# Patient Record
Sex: Female | Born: 1970 | Race: Black or African American | Hispanic: No | Marital: Married | Smoking: Never smoker
Health system: Southern US, Community
[De-identification: ages and names within clinical notes are randomized; demographics above are authoritative.]

## PROBLEM LIST (undated history)

## (undated) DIAGNOSIS — Z923 Personal history of irradiation: Secondary | ICD-10-CM

## (undated) DIAGNOSIS — Z9221 Personal history of antineoplastic chemotherapy: Secondary | ICD-10-CM

## (undated) DIAGNOSIS — I10 Essential (primary) hypertension: Secondary | ICD-10-CM

## (undated) DIAGNOSIS — R519 Headache, unspecified: Secondary | ICD-10-CM

## (undated) DIAGNOSIS — R51 Headache: Secondary | ICD-10-CM

## (undated) DIAGNOSIS — R000 Tachycardia, unspecified: Secondary | ICD-10-CM

## (undated) DIAGNOSIS — E0590 Thyrotoxicosis, unspecified without thyrotoxic crisis or storm: Secondary | ICD-10-CM

## (undated) DIAGNOSIS — E039 Hypothyroidism, unspecified: Secondary | ICD-10-CM

## (undated) DIAGNOSIS — E559 Vitamin D deficiency, unspecified: Secondary | ICD-10-CM

## (undated) DIAGNOSIS — D509 Iron deficiency anemia, unspecified: Secondary | ICD-10-CM

## (undated) DIAGNOSIS — R233 Spontaneous ecchymoses: Secondary | ICD-10-CM

## (undated) DIAGNOSIS — C50919 Malignant neoplasm of unspecified site of unspecified female breast: Secondary | ICD-10-CM

## (undated) DIAGNOSIS — R002 Palpitations: Secondary | ICD-10-CM

## (undated) DIAGNOSIS — R7309 Other abnormal glucose: Secondary | ICD-10-CM

## (undated) HISTORY — PX: KNEE SURGERY: SHX244

## (undated) HISTORY — DX: Spontaneous ecchymoses: R23.3

## (undated) HISTORY — PX: BREAST BIOPSY: SHX20

## (undated) HISTORY — DX: Palpitations: R00.2

## (undated) HISTORY — DX: Headache: R51

## (undated) HISTORY — PX: TONSILLECTOMY: SUR1361

## (undated) HISTORY — DX: Tachycardia, unspecified: R00.0

## (undated) HISTORY — DX: Essential (primary) hypertension: I10

## (undated) HISTORY — DX: Other abnormal glucose: R73.09

## (undated) HISTORY — PX: BREAST EXCISIONAL BIOPSY: SUR124

## (undated) HISTORY — DX: Iron deficiency anemia, unspecified: D50.9

## (undated) HISTORY — DX: Vitamin D deficiency, unspecified: E55.9

## (undated) HISTORY — DX: Hypothyroidism, unspecified: E03.9

## (undated) HISTORY — PX: CHOLECYSTECTOMY: SHX55

---
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
## 1999-09-29 ENCOUNTER — Other Ambulatory Visit: Admission: RE | Admit: 1999-09-29 | Discharge: 1999-09-29 | Payer: Self-pay | Admitting: Obstetrics and Gynecology

## 2000-02-26 ENCOUNTER — Ambulatory Visit (HOSPITAL_BASED_OUTPATIENT_CLINIC_OR_DEPARTMENT_OTHER): Admission: RE | Admit: 2000-02-26 | Discharge: 2000-02-26 | Payer: Self-pay | Admitting: *Deleted

## 2000-11-05 ENCOUNTER — Other Ambulatory Visit: Admission: RE | Admit: 2000-11-05 | Discharge: 2000-11-05 | Payer: Self-pay | Admitting: Obstetrics and Gynecology

## 2002-02-01 ENCOUNTER — Encounter: Admission: RE | Admit: 2002-02-01 | Discharge: 2002-02-01 | Payer: Self-pay | Admitting: Internal Medicine

## 2002-02-01 ENCOUNTER — Encounter: Payer: Self-pay | Admitting: Internal Medicine

## 2002-11-06 ENCOUNTER — Ambulatory Visit (HOSPITAL_COMMUNITY): Admission: RE | Admit: 2002-11-06 | Discharge: 2002-11-06 | Payer: Self-pay | Admitting: Obstetrics & Gynecology

## 2002-11-06 ENCOUNTER — Encounter: Payer: Self-pay | Admitting: Obstetrics & Gynecology

## 2003-01-23 ENCOUNTER — Encounter: Payer: Self-pay | Admitting: Obstetrics & Gynecology

## 2003-01-23 ENCOUNTER — Ambulatory Visit (HOSPITAL_COMMUNITY): Admission: RE | Admit: 2003-01-23 | Discharge: 2003-01-23 | Payer: Self-pay | Admitting: Obstetrics & Gynecology

## 2003-03-17 ENCOUNTER — Inpatient Hospital Stay (HOSPITAL_COMMUNITY): Admission: AD | Admit: 2003-03-17 | Discharge: 2003-03-19 | Payer: Self-pay | Admitting: Obstetrics & Gynecology

## 2003-03-23 ENCOUNTER — Encounter: Admission: RE | Admit: 2003-03-23 | Discharge: 2003-04-22 | Payer: Self-pay | Admitting: Obstetrics & Gynecology

## 2004-08-05 ENCOUNTER — Ambulatory Visit (HOSPITAL_COMMUNITY): Admission: RE | Admit: 2004-08-05 | Discharge: 2004-08-05 | Payer: Self-pay | Admitting: Obstetrics

## 2004-10-27 ENCOUNTER — Emergency Department (HOSPITAL_COMMUNITY): Admission: EM | Admit: 2004-10-27 | Discharge: 2004-10-27 | Payer: Self-pay | Admitting: Emergency Medicine

## 2015-06-26 ENCOUNTER — Ambulatory Visit (HOSPITAL_BASED_OUTPATIENT_CLINIC_OR_DEPARTMENT_OTHER): Admitting: Hematology & Oncology

## 2015-06-26 ENCOUNTER — Ambulatory Visit

## 2015-06-26 ENCOUNTER — Encounter: Payer: Self-pay | Admitting: Hematology & Oncology

## 2015-06-26 ENCOUNTER — Other Ambulatory Visit (HOSPITAL_BASED_OUTPATIENT_CLINIC_OR_DEPARTMENT_OTHER)

## 2015-06-26 VITALS — BP 130/86 | HR 104 | Temp 97.8°F | Resp 18 | Ht 69.0 in | Wt 195.0 lb

## 2015-06-26 DIAGNOSIS — D508 Other iron deficiency anemias: Secondary | ICD-10-CM | POA: Diagnosis not present

## 2015-06-26 DIAGNOSIS — D473 Essential (hemorrhagic) thrombocythemia: Secondary | ICD-10-CM

## 2015-06-26 DIAGNOSIS — N920 Excessive and frequent menstruation with regular cycle: Secondary | ICD-10-CM | POA: Diagnosis not present

## 2015-06-26 DIAGNOSIS — D509 Iron deficiency anemia, unspecified: Secondary | ICD-10-CM | POA: Diagnosis not present

## 2015-06-26 DIAGNOSIS — D75839 Thrombocytosis, unspecified: Secondary | ICD-10-CM

## 2015-06-26 LAB — CBC WITH DIFFERENTIAL (CANCER CENTER ONLY)
BASO#: 0 10*3/uL (ref 0.0–0.2)
BASO%: 0.4 % (ref 0.0–2.0)
EOS ABS: 0.1 10*3/uL (ref 0.0–0.5)
EOS%: 0.7 % (ref 0.0–7.0)
HCT: 36.6 % (ref 34.8–46.6)
HEMOGLOBIN: 12.3 g/dL (ref 11.6–15.9)
LYMPH#: 1.5 10*3/uL (ref 0.9–3.3)
LYMPH%: 17.2 % (ref 14.0–48.0)
MCH: 30.3 pg (ref 26.0–34.0)
MCHC: 33.6 g/dL (ref 32.0–36.0)
MCV: 90 fL (ref 81–101)
MONO#: 0.6 10*3/uL (ref 0.1–0.9)
MONO%: 6.5 % (ref 0.0–13.0)
NEUT#: 6.4 10*3/uL (ref 1.5–6.5)
NEUT%: 75.2 % (ref 39.6–80.0)
PLATELETS: 318 10*3/uL (ref 145–400)
RBC: 4.06 10*6/uL (ref 3.70–5.32)
RDW: 14.9 % (ref 11.1–15.7)
WBC: 8.5 10*3/uL (ref 3.9–10.0)

## 2015-06-26 LAB — CHCC SATELLITE - SMEAR

## 2015-06-26 NOTE — Progress Notes (Signed)
Referral MD  Reason for Referral: Transient thrombocytosis secondary to iron deficiency anemia   Chief Complaint  Patient presents with  . OTHER    New Patient  : My blood was not right.  HPI: Shannon Kane is a very charming 45 year old African-American female. She has a very interesting job. 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  She's been doing quite well. She does not have any sickle cell disease. There is none in the family.  Her gallbladder has been taken out. This was pulled years ago. Per she's had 4 knee surgeries on her right knee.  She was not feeling all that well recently. She saw her family doctor. Labs were done. She is found to have iron deficiency. She is found to have thrombocytosis. Her platelet count was 477,000. Her ferritin was 11 and her iron saturation was 10%.  She was given some iron supplementation. She tolerated this fairly well.  She feels better. She is not as tired.  She's had no problems with weight loss or weight gain. She has gained a Gahm weight because her knees have bothered her and she is having of exercise.  She's had no pain in the hands or feet.  She has menometrorrhagia. She has uterine fibroids. It sounds like she may need to have a hysterectomy. She sees her gynecologist in a week or so.  There is no history of malignancy in the family. She does not smoke. She drinks on occasion. She has 2 kids. She's had no problems with miscarriages.  She's had no rashes. She's had no cough or shortness of breath. She's had no change in bowel or bladder habits.  She does get her mammograms yearly and these have been okay.  Overall, her performance status is ECOG 0.   No past medical history on file.:  No past surgical history on file.:   Current outpatient prescriptions:  .  Cholecalciferol (VITAMIN D-3 PO), Take 4,000 capsules by mouth daily., Disp: , Rfl:  .  hydrochlorothiazide  (MICROZIDE) 12.5 MG capsule, , Disp: , Rfl:  .  Iron-Vit C-FA-B12-Biot-CU-DSS (FERIVAFA) 110-1 MG CAPS, , Disp: , Rfl:  .  lisinopril (PRINIVIL,ZESTRIL) 20 MG tablet, , Disp: , Rfl:  .  SPRINTEC 28 0.25-35 MG-MCG tablet, , Disp: , Rfl:  .  SYNTHROID 75 MCG tablet, , Disp: , Rfl: :  :  Not on File:  No family history on file.:  Social History   Social History  . Marital Status: Married    Spouse Name: N/A  . Number of Children: N/A  . Years of Education: N/A   Occupational History  . Not on file.   Social History Main Topics  . Smoking status: Never Smoker   . Smokeless tobacco: Not on file  . Alcohol Use: Not on file  . Drug Use: Not on file  . Sexual Activity: Not on file   Other Topics Concern  . Not on file   Social History Narrative  . No narrative on file  :  Pertinent items are noted in HPI.  Exam: @IPVITALS @  well-developed and well-nourished African-American female in no obvious distress. Vital signs show temperature of 97.8. Pulse 104. Blood pressure and 30/86. Weight is 195 pounds. Head and neck exam shows no ocular or oral lesions. She has no palpable cervical or supraclavicular lymph nodes. Lungs are clear. Cardiac exam regular rate and rhythm with no murmurs, rubs or bruits. Abdomen is soft. She has  good bowel sounds. There is no fluid wave. There is no palpable liver or spleen tip. Back exam shows no tenderness over the spine, ribs or hips. Extremity shows no clubbing, cyanosis or edema. Neurological exam shows no focal neurological deficits. Skin exam shows no erythema of the hands or feet.    Recent Labs  06/26/15 1332  WBC 8.5  HGB 12.3  HCT 36.6  PLT 318   No results for input(s): NA, K, CL, CO2, GLUCOSE, BUN, CREATININE, CALCIUM in the last 72 hours.  Blood smear review:  Normochromic and OC palpation of red blood cells. She has no target cells. There is no nucleated red blood cells. I see no spherocytes or schistocytes I see no rouleau  formation. White cells are normal in morphology maturation. There is no immature myeloid or lymphoid forms. She has no hypersegmented polys. There is no blasts. Platelets are adequate in number and size. She has well granulated platelets. She has a couple large platelets.  Pathology: None     Assessment and Plan:  [name] is a very charming 45 year old African-American female. She had transient thrombocytosis which clearly was from iron deficiency anemia.  Her platelet count was great today. She's not anemic. Her MCV is okay.  I cannot find anything on her physical exam that would suggest an underlying myeloproliferative process. I'll see anything that looks like inflammatory conditions.  She does not need a bone marrow test. I don't see that we had to send blood off for flow cytometry.  For now, I do still think we have to get her back into the office. We really are not going to be changing her health care.  I think that her menometrorrhagia is her biggest problem. I think if her uterus needs to come out or if she needs to be placed onto someone that will help with the monthly cycles, that probably would help her most.  I spent about 45 minutes with her. She is very charming. He was fun talking to her about her job.  Shannon Kane

## 2015-06-27 LAB — IRON AND TIBC
%SAT: 16 % — ABNORMAL LOW (ref 21–57)
Iron: 70 ug/dL (ref 41–142)
TIBC: 425 ug/dL (ref 236–444)
UIBC: 355 ug/dL (ref 120–384)

## 2015-06-27 LAB — RETICULOCYTES: Reticulocyte Count: 1.5 % (ref 0.6–2.6)

## 2015-06-27 LAB — FERRITIN: FERRITIN: 14 ng/mL (ref 9–269)

## 2015-07-01 ENCOUNTER — Telehealth: Payer: Self-pay | Admitting: *Deleted

## 2015-07-01 NOTE — Telephone Encounter (Addendum)
Patient aware of results. Patient is taking her iron supplement.  ----- Message from Volanda Napoleon, MD sent at 06/27/2015  2:11 PM EST ----- Call - yur iron is borderline. Please continue to take iron pills or MVI with iron!!  Shannon Kane

## 2015-07-01 NOTE — Telephone Encounter (Signed)
duplicate

## 2016-02-16 DIAGNOSIS — N393 Stress incontinence (female) (male): Secondary | ICD-10-CM | POA: Insufficient documentation

## 2018-03-03 ENCOUNTER — Encounter: Payer: Self-pay | Admitting: Internal Medicine

## 2018-03-03 ENCOUNTER — Ambulatory Visit (INDEPENDENT_AMBULATORY_CARE_PROVIDER_SITE_OTHER): Admitting: Internal Medicine

## 2018-03-03 VITALS — BP 126/88 | HR 69 | Temp 98.1°F | Ht 69.0 in | Wt 184.6 lb

## 2018-03-03 DIAGNOSIS — R7309 Other abnormal glucose: Secondary | ICD-10-CM | POA: Diagnosis not present

## 2018-03-03 DIAGNOSIS — I10 Essential (primary) hypertension: Secondary | ICD-10-CM | POA: Diagnosis not present

## 2018-03-03 DIAGNOSIS — Z5181 Encounter for therapeutic drug level monitoring: Secondary | ICD-10-CM

## 2018-03-03 DIAGNOSIS — E039 Hypothyroidism, unspecified: Secondary | ICD-10-CM | POA: Diagnosis not present

## 2018-03-03 DIAGNOSIS — Z79899 Other long term (current) drug therapy: Secondary | ICD-10-CM

## 2018-03-03 NOTE — Progress Notes (Signed)
  Subjective:     Patient ID: Shannon Kane , female    DOB: 1971/05/06 , 47 y.o.   MRN: [id number]   Hypertension  This is a chronic problem. The current episode started more than 1 year ago. The problem is unchanged. The problem is controlled. Pertinent negatives include no blurred vision, chest pain or headaches. Agents associated with hypertension include thyroid hormones. There are no compliance problems.      Past Medical History:  Diagnosis Date  . Abnormal glucose   . Headache   . Hypothyroidism   . Iron deficiency anemia   . Palpitation   . Spontaneous ecchymoses   . Tachycardia   . Vitamin D deficiency       Current Outpatient Medications:  .  Cholecalciferol (VITAMIN D-3 PO), Take 4,000 capsules by mouth daily., Disp: , Rfl:  .  hydrochlorothiazide (MICROZIDE) 12.5 MG capsule, , Disp: , Rfl:  .  Iron-Vit C-FA-B12-Biot-CU-DSS (FERIVAFA) 110-1 MG CAPS, , Disp: , Rfl:  .  lisinopril (PRINIVIL,ZESTRIL) 20 MG tablet, , Disp: , Rfl:  .  Specialty Vitamins Products (MAGNESIUM, AMINO ACID CHELATE,) 133 MG tablet, Take 1 tablet by mouth 2 (two) times daily., Disp: , Rfl:  .  SYNTHROID 75 MCG tablet, , Disp: , Rfl:    Allergies  Allergen Reactions  . Amoxicillin   . Ciprofloxacin   . Penicillins      Review of Systems  Constitutional: Negative.   HENT: Negative.   Eyes: Negative.  Negative for blurred vision.  Respiratory: Negative.   Cardiovascular: Negative.  Negative for chest pain.  Gastrointestinal: Negative.   Skin: Negative.   Neurological: Negative for headaches.  Psychiatric/Behavioral: Negative.      Today's Vitals   03/03/18 0946  BP: 126/88  Pulse: 69  Temp: 98.1 F (36.7 C)  TempSrc: Oral  Weight: 184 lb 9.6 oz (83.7 kg)  Height: '5\' 9"'$  (1.753 m)   Body mass index is 27.26 kg/m.   Objective:  Physical Exam  Constitutional: She appears well-developed and well-nourished.  HENT:  Head: Normocephalic and atraumatic.  Eyes: EOM are normal.   Cardiovascular: Normal rate, regular rhythm, normal heart sounds and intact distal pulses.  Pulmonary/Chest: Effort normal and breath sounds normal.  Skin: Skin is warm and dry.  Psychiatric: She has a normal mood and affect.  Nursing note and vitals reviewed.       Assessment And Plan:     1. Essential hypertension, benign Fair control. She will continue with current meds. She is encouraged to avoid adding salt to her foods. She was congratulated on her lifestyle changes and encouraged to keep up the great work. She will rto in 4 months for her previously scheduled appt.   - BMP8+EGFR - Hemoglobin A1c - Lipid Profile  2. Primary hypothyroidism I will check a TSH and adjust meds as needed.  Importance of medication compliance was discussed with the patient.  - Lipid Profile - TSH  3. Other abnormal glucose  HER A1C HAS BEEN ELEVATED IN THE PAST. I WILL CHECK AN A1C, BMET TODAY. SHE WAS ENCOURAGED TO AVOID SUGARY BEVERAGES AND PROCESSED FOODS INCLUDNG BREADS, RICE AND PASTA.  - Hemoglobin A1c  4. Encounter for monitoring antihypertensive use         [name], MD

## 2018-03-04 LAB — LIPID PANEL
CHOL/HDL RATIO: 2.6 ratio (ref 0.0–4.4)
CHOLESTEROL TOTAL: 152 mg/dL (ref 100–199)
HDL: 58 mg/dL (ref >39)
LDL CALC: 84 mg/dL (ref 0–99)
TRIGLYCERIDES: 49 mg/dL (ref 0–149)
VLDL CHOLESTEROL CAL: 10 mg/dL (ref 5–40)

## 2018-03-04 LAB — HEMOGLOBIN A1C
Est. average glucose Bld gHb Est-mCnc: 117 mg/dL
Hgb A1c MFr Bld: 5.7 % — ABNORMAL HIGH (ref 4.8–5.6)

## 2018-03-04 LAB — TSH: TSH: 2.03 u[IU]/mL (ref 0.450–4.500)

## 2018-03-04 LAB — BMP8+EGFR
BUN / CREAT RATIO: 8 — AB (ref 9–23)
BUN: 6 mg/dL (ref 6–24)
CO2: 22 mmol/L (ref 20–29)
CREATININE: 0.73 mg/dL (ref 0.57–1.00)
Calcium: 9.3 mg/dL (ref 8.7–10.2)
Chloride: 101 mmol/L (ref 96–106)
GFR, EST AFRICAN AMERICAN: 112 mL/min/{1.73_m2} (ref >59)
GFR, EST NON AFRICAN AMERICAN: 97 mL/min/{1.73_m2} (ref >59)
Glucose: 85 mg/dL (ref 65–99)
Potassium: 4.2 mmol/L (ref 3.5–5.2)
Sodium: 138 mmol/L (ref 134–144)

## 2018-03-05 NOTE — Progress Notes (Signed)
Here are your lab results:  Your liver and kidney function are normal.  Your a1c is 5.7, this is in prediabetes range. Normal is 5.6 - you are almost there! Keep up the great work.   Your cholesterol looks great. Your thyroid function is within normal limits. Please continue with current supplementation.  Sincerely,    Jataya Wann N. Baird Cancer, MD

## 2018-05-18 HISTORY — PX: COLONOSCOPY: SHX174

## 2018-05-28 ENCOUNTER — Ambulatory Visit (INDEPENDENT_AMBULATORY_CARE_PROVIDER_SITE_OTHER): Admitting: Internal Medicine

## 2018-05-28 ENCOUNTER — Encounter: Payer: Self-pay | Admitting: Internal Medicine

## 2018-05-28 VITALS — BP 120/86 | HR 103 | Temp 97.8°F | Ht 69.0 in | Wt 179.4 lb

## 2018-05-28 DIAGNOSIS — M7918 Myalgia, other site: Secondary | ICD-10-CM | POA: Diagnosis not present

## 2018-05-28 DIAGNOSIS — Z0000 Encounter for general adult medical examination without abnormal findings: Secondary | ICD-10-CM | POA: Diagnosis not present

## 2018-05-28 DIAGNOSIS — H209 Unspecified iridocyclitis: Secondary | ICD-10-CM

## 2018-05-28 DIAGNOSIS — I10 Essential (primary) hypertension: Secondary | ICD-10-CM | POA: Diagnosis not present

## 2018-05-28 DIAGNOSIS — W108XXA Fall (on) (from) other stairs and steps, initial encounter: Secondary | ICD-10-CM | POA: Diagnosis not present

## 2018-05-28 LAB — POCT URINALYSIS DIPSTICK
Bilirubin, UA: NEGATIVE
Glucose, UA: NEGATIVE
Ketones, UA: NEGATIVE
Leukocytes, UA: NEGATIVE
Nitrite, UA: NEGATIVE
Protein, UA: NEGATIVE
Spec Grav, UA: 1.01 (ref 1.010–1.025)
Urobilinogen, UA: 0.2 E.U./dL
pH, UA: 7 (ref 5.0–8.0)

## 2018-05-28 LAB — POCT UA - MICROALBUMIN
Albumin/Creatinine Ratio, Urine, POC: <30
Creatinine, POC: 50 mg/dL
MICROALBUMIN (UR) POC: 10 mg/L

## 2018-05-28 NOTE — Patient Instructions (Signed)

## 2018-05-29 ENCOUNTER — Encounter: Payer: Self-pay | Admitting: Internal Medicine

## 2018-05-29 DIAGNOSIS — H209 Unspecified iridocyclitis: Secondary | ICD-10-CM | POA: Insufficient documentation

## 2018-05-29 DIAGNOSIS — D500 Iron deficiency anemia secondary to blood loss (chronic): Secondary | ICD-10-CM | POA: Insufficient documentation

## 2018-05-29 DIAGNOSIS — E039 Hypothyroidism, unspecified: Secondary | ICD-10-CM | POA: Insufficient documentation

## 2018-05-29 DIAGNOSIS — I10 Essential (primary) hypertension: Secondary | ICD-10-CM | POA: Insufficient documentation

## 2018-05-29 NOTE — Progress Notes (Signed)
Subjective:     Patient ID: Shannon Kane , female    DOB: [birth year] , 48 y.o.   MRN: [id number]   Chief Complaint  Patient presents with  . Annual Exam    HPI  She is here today for a full physical exam. She is followed by Liz[facility] for her pelvic exams.   Hypertension  This is a chronic problem. The current episode started more than 1 year ago. The problem has been resolved since onset. The problem is controlled. Pertinent negatives include no blurred vision, chest pain, palpitations or shortness of breath. There are no compliance problems.  Identifiable causes of hypertension include a thyroid problem.     Past Medical History:  Diagnosis Date  . Abnormal glucose   . Headache   . Hypothyroidism   . Iron deficiency anemia   . Palpitation   . Spontaneous ecchymoses   . Tachycardia   . Vitamin D deficiency      Family History  Problem Relation Age of Onset  . Diabetes Mother   . Hypertension Mother   . Leukemia Father      Current Outpatient Medications:  .  Cholecalciferol (VITAMIN D-3 PO), Take 4,000 capsules by mouth daily., Disp: , Rfl:  .  lisinopril (PRINIVIL,ZESTRIL) 20 MG tablet, , Disp: , Rfl:  .  Specialty Vitamins Products (MAGNESIUM, AMINO ACID CHELATE,) 133 MG tablet, Take 1 tablet by mouth 2 (two) times daily., Disp: , Rfl:  .  SYNTHROID 75 MCG tablet, , Disp: , Rfl:  .  hydrochlorothiazide (MICROZIDE) 12.5 MG capsule, , Disp: , Rfl:  .  Iron-Vit C-FA-B12-Biot-CU-DSS (FERIVAFA) 110-1 MG CAPS, , Disp: , Rfl:    Allergies  Allergen Reactions  . Amoxicillin   . Ciprofloxacin   . Penicillins       Patient's last menstrual period was 05/16/2018..  Negative for: breast discharge, breast lump(s), breast pain and breast self exam. Associated symptoms include abnormal vaginal bleeding. Pertinent negatives include abnormal bleeding (hematology), anxiety, decreased libido, depression, difficulty falling sleep, dyspareunia, history of infertility,  nocturia, sexual dysfunction, sleep disturbances, urinary incontinence, urinary urgency, vaginal discharge and vaginal itching. Diet regular.The patient states her exercise level is    . The patient's tobacco use is:  Social History   Tobacco Use  Smoking Status Never Smoker  Smokeless Tobacco Former Systems developer  . She has been exposed to passive smoke. The patient's alcohol use is:  Social History   Substance and Sexual Activity  Alcohol Use Not on file  . Additional information: Last pap 2019 next one scheduled for 2020 with her GYN.  Review of Systems  Constitutional: Negative.   HENT: Negative.   Eyes: Negative.  Negative for blurred vision.  Respiratory: Negative.  Negative for shortness of breath.   Cardiovascular: Negative.  Negative for chest pain and palpitations.  Gastrointestinal: Negative.   Endocrine: Negative.   Genitourinary: Negative.   Musculoskeletal: Positive for back pain (she c/o l buttock pain and LBP. reports sustaining a fall in July 2019. States she lost her balance and fell down a flight of steps. She did not seek medical atttention. Has been seen by [facility], but is not satisfied w/ the staff. ).  Skin: Negative.   Allergic/Immunologic: Negative.   Neurological: Negative.   Hematological: Negative.   Psychiatric/Behavioral: Negative.      Today's Vitals   05/28/18 0951  BP: 120/86  Pulse: (!) 103  Temp: 97.8 F (36.6 C)  TempSrc: Oral  Weight: 179 lb 6.4 oz (  81.4 kg)  Height: _0  (1.753 m)   Body mass index is 26.49 kg/m.   Objective:  Physical Exam Vitals signs and nursing note reviewed.  Constitutional:      Appearance: Normal appearance. She is normal weight.  HENT:     Head: Normocephalic and atraumatic.     Right Ear: Tympanic membrane, ear canal and external ear normal.     Left Ear: Tympanic membrane, ear canal and external ear normal.     Nose: Nose normal.     Mouth/Throat:     Mouth: Mucous membranes are moist.     Pharynx:  Oropharynx is clear.  Eyes:     General: Lids are normal. Vision grossly intact. Gaze aligned appropriately.     Extraocular Movements: Extraocular movements intact.     Conjunctiva/sclera:     Left eye: Left conjunctiva is injected.     Pupils: Pupils are equal, round, and reactive to light.  Cardiovascular:     Rate and Rhythm: Normal rate and regular rhythm.     Pulses: Normal pulses.     Heart sounds: Normal heart sounds.  Pulmonary:     Effort: Pulmonary effort is normal.     Breath sounds: Normal breath sounds.  Chest:     Breasts:        Right: Normal. No swelling, bleeding, inverted nipple, mass or nipple discharge.        Left: Normal. No swelling, bleeding, inverted nipple, mass or nipple discharge.  Abdominal:     General: Abdomen is flat. Bowel sounds are normal.     Palpations: Abdomen is soft.  Genitourinary:    Comments: deferred Musculoskeletal: Normal range of motion.     Comments: Left lower gluteal tenderness to deep palpation. She has full rom of left hip.   Skin:    General: Skin is warm and dry.  Neurological:     General: No focal deficit present.     Mental Status: She is alert.  Psychiatric:        Mood and Affect: Mood normal.         Assessment And Plan:     1. Routine general medical examination at health care facility  A full exam was performed.  Importance of monthly self breast exams was discussed with the patient.  PATIENT HAS BEEN ADVISED TO GET 30-45 MINUTES REGULAR EXERCISE NO LESS THAN FOUR TO FIVE DAYS PER WEEK - BOTH WEIGHTBEARING EXERCISES AND AEROBIC ARE RECOMMENDED.  SHE IS ADVISED TO FOLLOW A HEALTHY DIET WITH AT LEAST SIX FRUITS/VEGGIES PER DAY, DECREASE INTAKE OF RED MEAT, AND TO INCREASE FISH INTAKE TO TWO DAYS PER WEEK.  MEATS/FISH SHOULD NOT BE FRIED, BAKED OR BROILED IS PREFERABLE.  I SUGGEST WEARING SPF 50 SUNSCREEN ON EXPOSED PARTS AND ESPECIALLY WHEN IN THE DIRECT SUNLIGHT FOR AN EXTENDED PERIOD OF TIME.  PLEASE AVOID FAST  FOOD RESTAURANTS AND INCREASE YOUR WATER INTAKE.  - CMP14+EGFR - CBC - Lipid panel - Hemoglobin A1c - HIV antibody (with reflex) - RPR - Hepatitis C antibody - Hepatitis B Surface Antigen - Hepatitis B Surface Antibody - TSH - T4, Free - T3, free  2. Essential hypertension, benign  Well controlled. She will continue with current meds. She was congratulated on her 15+ pound weight loss and encouraged to keep up the great work to maintain this loss. She is within 10 pounds of her goal weight. She will rto in six months for re-evaluation. She is encouraged to let me  know if she has episodes of dizziness, lightheadedness. She will likely need a medication adjustment at that time.   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 She is also followed by Ophthalmology. She will continue with their treatment.   4. Fall down steps, initial encounter  Occurred middle July 2019, possibly the 11th or so. She admits that she did not seek immediate medical attention after the fall.   5. Left buttock pain  This is a result of her fall during the Summer.  I will refer her to [facility] for further evaluation. I am sure there is an issue with spinal alignment due to the fall.   [name], MD

## 2018-05-30 LAB — LIPID PANEL
CHOL/HDL RATIO: 2.8 ratio (ref 0.0–4.4)
Cholesterol, Total: 156 mg/dL (ref 100–199)
HDL: 55 mg/dL (ref >39)
LDL CALC: 90 mg/dL (ref 0–99)
Triglycerides: 53 mg/dL (ref 0–149)
VLDL CHOLESTEROL CAL: 11 mg/dL (ref 5–40)

## 2018-05-30 LAB — CBC
HEMATOCRIT: 39.1 % (ref 34.0–46.6)
Hemoglobin: 13 g/dL (ref 11.1–15.9)
MCH: 30.5 pg (ref 26.6–33.0)
MCHC: 33.2 g/dL (ref 31.5–35.7)
MCV: 92 fL (ref 79–97)
PLATELETS: 337 10*3/uL (ref 150–450)
RBC: 4.26 x10E6/uL (ref 3.77–5.28)
RDW: 12.8 % (ref 11.7–15.4)
WBC: 8.8 10*3/uL (ref 3.4–10.8)

## 2018-05-30 LAB — CMP14+EGFR
ALBUMIN: 4.3 g/dL (ref 3.5–5.5)
ALT: 12 IU/L (ref 0–32)
AST: 18 IU/L (ref 0–40)
Albumin/Globulin Ratio: 1.5 (ref 1.2–2.2)
Alkaline Phosphatase: 79 IU/L (ref 39–117)
BUN/Creatinine Ratio: 7 — ABNORMAL LOW (ref 9–23)
BUN: 6 mg/dL (ref 6–24)
Bilirubin Total: 0.4 mg/dL (ref 0.0–1.2)
CALCIUM: 9.4 mg/dL (ref 8.7–10.2)
CO2: 24 mmol/L (ref 20–29)
CREATININE: 0.86 mg/dL (ref 0.57–1.00)
Chloride: 103 mmol/L (ref 96–106)
GFR calc Af Amer: 92 mL/min/{1.73_m2} (ref >59)
GFR, EST NON AFRICAN AMERICAN: 80 mL/min/{1.73_m2} (ref >59)
GLOBULIN, TOTAL: 2.9 g/dL (ref 1.5–4.5)
GLUCOSE: 82 mg/dL (ref 65–99)
Potassium: 4.2 mmol/L (ref 3.5–5.2)
SODIUM: 140 mmol/L (ref 134–144)
Total Protein: 7.2 g/dL (ref 6.0–8.5)

## 2018-05-30 LAB — HEPATITIS B SURFACE ANTIBODY,QUALITATIVE: Hep B Surface Ab, Qual: NONREACTIVE

## 2018-05-30 LAB — HEMOGLOBIN A1C
ESTIMATED AVERAGE GLUCOSE: 117 mg/dL
HEMOGLOBIN A1C: 5.7 % — AB (ref 4.8–5.6)

## 2018-05-30 LAB — RPR: RPR Ser Ql: NONREACTIVE

## 2018-05-30 LAB — HEPATITIS C ANTIBODY: Hep C Virus Ab: 0.1 s/co ratio (ref 0.0–0.9)

## 2018-05-30 LAB — T3, FREE: T3, Free: 2.5 pg/mL (ref 2.0–4.4)

## 2018-05-30 LAB — HEPATITIS B SURFACE ANTIGEN: Hepatitis B Surface Ag: NEGATIVE

## 2018-05-30 LAB — HIV ANTIBODY (ROUTINE TESTING W REFLEX): HIV Screen 4th Generation wRfx: NONREACTIVE

## 2018-05-30 LAB — TSH: TSH: 2.13 u[IU]/mL (ref 0.450–4.500)

## 2018-05-30 LAB — T4, FREE: FREE T4: 1.53 ng/dL (ref 0.82–1.77)

## 2018-05-30 NOTE — Progress Notes (Signed)
Here are your lab results:  Your liver and kidney function are normal .Your blood count is normal.  Your cholesterol is great. Your hba1c is 5.7, this is in prediabetes range. Normal is 5.6 or less.  You are almost there!  You are negative for HIV. You are negative for syphilis. You are negative for hepatitis c virus. You are negative for hepatitis b virus. Your thyroid is great. Please continue with current supplementation.   You are doing great! Keep up the great work!  Sincerely,    Kyl Givler N. Baird Cancer, MD

## 2018-06-21 ENCOUNTER — Telehealth: Payer: Self-pay

## 2018-06-21 NOTE — Telephone Encounter (Signed)
-----   Message from [name], MD sent at 06/21/2018  1:35 PM EST ----- Clearly her meter is not reading her blood pressures accurately. There would not be that huge of a jump in her blood pressure.   Cut back to 1/2 pill - take at night.  She needs a pill cutter.   She needs to come in for f/u in 2-3 weeks for bp check on lower dose of her medication.  ----- Message ----- From: Michelle Nasuti, [place] Sent: 06/21/2018  12:29 PM EST To: [name], MD  The patient said that she has felt nauseated and dizzy after taking her Lisinopril and that this is day 2 that this has happened.  The pt said that she was asked at her visit in Jan if her medication makes her have these symptoms.  The pt said she checked her BP this morning and right arm was 87/64 she checked it in the left arm 2 mins later and got 163/112.  I tried to schedule the pt an appt and the pt said that she will if Dr. Baird Cancer thinks that she needed an appt.

## 2018-06-21 NOTE — Telephone Encounter (Signed)
Patient notified

## 2018-07-11 ENCOUNTER — Ambulatory Visit (INDEPENDENT_AMBULATORY_CARE_PROVIDER_SITE_OTHER): Admitting: Internal Medicine

## 2018-07-11 ENCOUNTER — Encounter: Payer: Self-pay | Admitting: Internal Medicine

## 2018-07-11 VITALS — BP 152/90 | HR 74 | Temp 97.5°F | Ht 69.0 in | Wt 180.0 lb

## 2018-07-11 DIAGNOSIS — E049 Nontoxic goiter, unspecified: Secondary | ICD-10-CM

## 2018-07-11 DIAGNOSIS — I10 Essential (primary) hypertension: Secondary | ICD-10-CM

## 2018-07-11 DIAGNOSIS — E038 Other specified hypothyroidism: Secondary | ICD-10-CM | POA: Diagnosis not present

## 2018-07-11 NOTE — Progress Notes (Signed)
Subjective:     Patient ID: Shannon Kane , female    DOB: [birth year] , 48 y.o.   MRN: [id number]   Chief Complaint  Patient presents with  . Hypertension    HPI  She is here today for a bp check. She called 2/4 stating lisinopril was making her dizzy. She typically takes her meds at night. She started to take her meds 1/2 pill at night. Her dizziness has resolved. She has not been following her blood pressures since cutting back on the dose of her medication.   Hypertension      Past Medical History:  Diagnosis Date  . Abnormal glucose   . Headache   . Hypothyroidism   . Iron deficiency anemia   . Palpitation   . Spontaneous ecchymoses   . Tachycardia   . Vitamin D deficiency      Family History  Problem Relation Age of Onset  . Diabetes Mother   . Hypertension Mother   . Leukemia Father      Current Outpatient Medications:  .  Cholecalciferol (VITAMIN D-3 PO), Take 4,000 capsules by mouth daily., Disp: , Rfl:  .  lisinopril (PRINIVIL,ZESTRIL) 20 MG tablet, 1/2 tablet daily, Disp: , Rfl:  .  MAGNESIUM CITRATE PO, Take by mouth., Disp: , Rfl:  .  Specialty Vitamins Products (MAGNESIUM, AMINO ACID CHELATE,) 133 MG tablet, Take 1 tablet by mouth 2 (two) times daily., Disp: , Rfl:  .  SYNTHROID 75 MCG tablet, , Disp: , Rfl:    Allergies  Allergen Reactions  . Amoxicillin   . Ciprofloxacin   . Penicillins      Review of Systems  Constitutional: Negative.   Respiratory: Negative.   Cardiovascular: Negative.   Gastrointestinal: Negative.   Neurological: Negative.   Psychiatric/Behavioral: Negative.      Today's Vitals   07/11/18 1103  BP: (!) 152/90  Pulse: 74  Temp: (!) 97.5 F (36.4 C)  TempSrc: Oral  Weight: 180 lb (81.6 kg)  Height: 5\' 9"  (1.753 m)   Body mass index is 26.58 kg/m.   Objective:  Physical Exam Vitals signs and nursing note reviewed.  Constitutional:      Appearance: Normal appearance.  HENT:     Head: Normocephalic and  atraumatic.  Neck:     Comments: Slight thyroid enlargement on the right Cardiovascular:     Rate and Rhythm: Normal rate and regular rhythm.     Heart sounds: Normal heart sounds.  Pulmonary:     Effort: Pulmonary effort is normal.     Breath sounds: Normal breath sounds.  Skin:    General: Skin is warm.  Neurological:     General: No focal deficit present.     Mental Status: She is alert.  Psychiatric:        Mood and Affect: Mood normal.        Behavior: Behavior normal.         Assessment And Plan:     1. Essential hypertension, benign  Uncontrolled. She will now start taking lisinopril 20mg  1/2 pill twice daily. She is also encouraged to check bp at different times of the day - some mornings, some afternoons, sometimes at bedtime. She agrees to email this information to me. I will make further recommendations once her labs are available for review.   2. Other specified hypothyroidism  She will continue with current dose of Synthroid.   3. Goiter  She agrees to thyroid ultrasound.   - US THYROID; Future  [name], MD

## 2018-08-02 ENCOUNTER — Other Ambulatory Visit: Payer: Self-pay | Admitting: Internal Medicine

## 2018-08-03 ENCOUNTER — Other Ambulatory Visit: Payer: Self-pay

## 2018-08-03 NOTE — Telephone Encounter (Signed)
The pt was notified that her thyroid ultrasound was normal.

## 2018-09-29 ENCOUNTER — Encounter: Payer: Self-pay | Admitting: Internal Medicine

## 2018-11-21 ENCOUNTER — Ambulatory Visit (INDEPENDENT_AMBULATORY_CARE_PROVIDER_SITE_OTHER): Admitting: Internal Medicine

## 2018-11-21 ENCOUNTER — Other Ambulatory Visit: Payer: Self-pay

## 2018-11-21 ENCOUNTER — Encounter: Payer: Self-pay | Admitting: Internal Medicine

## 2018-11-21 VITALS — BP 124/88 | HR 72 | Temp 98.5°F | Ht 68.2 in | Wt 181.2 lb

## 2018-11-21 DIAGNOSIS — D75839 Thrombocytosis, unspecified: Secondary | ICD-10-CM | POA: Insufficient documentation

## 2018-11-21 DIAGNOSIS — E663 Overweight: Secondary | ICD-10-CM

## 2018-11-21 DIAGNOSIS — R7309 Other abnormal glucose: Secondary | ICD-10-CM

## 2018-11-21 DIAGNOSIS — D473 Essential (hemorrhagic) thrombocythemia: Secondary | ICD-10-CM | POA: Insufficient documentation

## 2018-11-21 DIAGNOSIS — I10 Essential (primary) hypertension: Secondary | ICD-10-CM

## 2018-11-21 DIAGNOSIS — E038 Other specified hypothyroidism: Secondary | ICD-10-CM | POA: Diagnosis not present

## 2018-11-21 DIAGNOSIS — Z1211 Encounter for screening for malignant neoplasm of colon: Secondary | ICD-10-CM

## 2018-11-21 NOTE — Patient Instructions (Signed)
Thyroid-Stimulating Hormone Test Why am I having this test? You may have a thyroid-stimulating hormone (TSH) test if you have possible symptoms of abnormal thyroid hormone levels. This test can help your health care provider:  Diagnose a disorder of the thyroid gland or pituitary gland.  Manage your condition and treatment if you have an underactive thyroid (hypothyroidism) or an overactive thyroid (hyperthyroidism). Newborn babies may have this test done to screen for hypothyroidism that is present at birth (congenital). The thyroid is a gland in the lower front of the neck. It makes hormones that affect many body parts and systems, including the system that affects how quickly the body burns fuel for energy (metabolism). The pituitary gland is located just below the brain, behind the eyes and nasal passages. It helps maintain thyroid hormone levels and thyroid gland function. What is being tested? This test measures the amount of TSH in your blood. TSH may also be called thyrotropin. When the thyroid does not make enough hormones, the pituitary gland releases TSH into the bloodstream to stimulate the thyroid gland to make more hormones. What kind of sample is taken?     A blood sample is required for this test. It is usually collected by inserting a needle into a blood vessel. For newborns, a small amount of blood may be collected from the umbilical cord, or by using a small needle to prick the baby's heel (heel stick). Tell a health care provider about:  All medicines you are taking, including vitamins, herbs, eye drops, creams, and over-the-counter medicines.  Any blood disorders you have.  Any surgeries you have had.  Any medical conditions you have.  Whether you are pregnant or may be pregnant. How are the results reported? Your test results will be reported as a value that indicates how much TSH is in your blood. Your health care provider will compare your results to normal ranges  that were established after testing a large group of people (reference ranges). Reference ranges may vary among labs and hospitals. For this test, common reference ranges are:  Adult: 2-10 microunits/mL or 2-10 milliunits/L.  Newborn: ? Heel stick: 3-18 microunits/mL or 3-18 milliunits/L. ? Umbilical cord: 1-94 microunits/mL or 3-12 milliunits/L. What do the results mean? Results that are within the reference range are considered normal. This means that you have a normal amount of TSH in your blood. Results that are higher than the reference range mean that your TSH levels are too high. This may mean:  Your thyroid gland is not making enough thyroid hormones.  Your thyroid medicine dosage is too low.  You have a tumor on your pituitary gland. This is rare. Results that are lower than the reference range mean that your TSH levels are too low. This may be caused by hyperthyroidism or by a problem with the pituitary gland function. Talk with your health care provider about what your results mean. Questions to ask your health care provider Ask your health care provider, or the department that is doing the test:  When will my results be ready?  How will I get my results?  What are my treatment options?  What other tests do I need?  What are my next steps? Summary  You may have a thyroid-stimulating hormone (TSH) test if you have possible symptoms of abnormal thyroid hormone levels.  The thyroid is a gland in the lower front of the neck. It makes hormones that affect many body parts and systems.  The pituitary gland is located  just below the brain, behind the eyes and nasal passages. It helps maintain thyroid hormone levels and thyroid gland function.  This test measures the amount of TSH in your blood. TSH is made by the pituitary gland. It may also be called thyrotropin. This information is not intended to replace advice given to you by your health care provider. Make sure you  discuss any questions you have with your health care provider. Document Released: 05/29/2004 Document Revised: 08/02/2017 Document Reviewed: 01/05/2017 Elsevier Patient Education  2020 [employer].

## 2018-11-21 NOTE — Progress Notes (Signed)
Subjective:     Patient ID: Shannon Kane , female    DOB: 1970-09-01 , 48 y.o.   MRN: [id number]   Chief Complaint  Patient presents with  . Hypertension  . Hypothyroidism    HPI  Hypertension This is a chronic problem. The current episode started more than 1 year ago. The problem has been gradually improving since onset. The problem is controlled. Pertinent negatives include no blurred vision, chest pain, orthopnea, palpitations or shortness of breath. Past treatments include ACE inhibitors. The current treatment provides moderate improvement. Identifiable causes of hypertension include a thyroid problem.  Thyroid Problem Presents for follow-up visit. Patient reports no cold intolerance, leg swelling, palpitations or weight gain. The symptoms have been stable.     Past Medical History:  Diagnosis Date  . Abnormal glucose   . Headache   . Hypothyroidism   . Iron deficiency anemia   . Palpitation   . Spontaneous ecchymoses   . Tachycardia   . Vitamin D deficiency      Family History  Problem Relation Age of Onset  . Diabetes Mother   . Hypertension Mother   . Leukemia Father      Current Outpatient Medications:  .  Cholecalciferol (VITAMIN D-3 PO), Take 4,000 capsules by mouth daily., Disp: , Rfl:  .  lisinopril (PRINIVIL,ZESTRIL) 20 MG tablet, 1/2 tablet daily, Disp: , Rfl:  .  SYNTHROID 75 MCG tablet, TAKE 1 TABLET ON MONDAY THROUGH SUNDAY, Disp: 90 tablet, Rfl: 3 .  MAGNESIUM CITRATE PO, Take 500 mcg by mouth. Only when having trouble sleeping, Disp: , Rfl:    Allergies  Allergen Reactions  . Amoxicillin   . Ciprofloxacin   . Penicillins      Review of Systems  Constitutional: Negative.  Negative for weight gain.  Eyes: Negative for blurred vision.  Respiratory: Negative.  Negative for shortness of breath.   Cardiovascular: Negative.  Negative for chest pain, palpitations and orthopnea.  Gastrointestinal: Negative.   Endocrine: Negative for cold intolerance.   Neurological: Negative.   Psychiatric/Behavioral: Negative.      Today's Vitals   11/21/18 1407  BP: 124/88  Pulse: 72  Temp: 98.5 F (36.9 C)  TempSrc: Oral  Weight: 181 lb 3.2 oz (82.2 kg)  Height: 5' 8.2" (1.732 m)   Body mass index is 27.39 kg/m.   Objective:  Physical Exam Vitals signs and nursing note reviewed.  Constitutional:      Appearance: Normal appearance.  HENT:     Head: Normocephalic and atraumatic.  Cardiovascular:     Rate and Rhythm: Normal rate and regular rhythm.     Heart sounds: Normal heart sounds.  Pulmonary:     Effort: Pulmonary effort is normal.     Breath sounds: Normal breath sounds.  Skin:    General: Skin is warm.  Neurological:     General: No focal deficit present.     Mental Status: She is alert.  Psychiatric:        Mood and Affect: Mood normal.        Behavior: Behavior normal.         Assessment And Plan:     1. Essential hypertension, benign  Fair control. She is aware of elevated diastolic pressure. She will continue with current meds for now. She will rto in six months for her next physical examination.  - CMP14+EGFR  2. Other specified hypothyroidism  I will check thyroid panel and adjust meds as needed.  - TSH -  T4, Free  3. Other abnormal glucose  HER A1C HAS BEEN ELEVATED IN THE PAST. I WILL CHECK AN A1C, BMET TODAY. SHE WAS ENCOURAGED TO AVOID SUGARY BEVERAGES AND PROCESSED FOODS INCLUDNG BREADS, RICE AND PASTA.  - Hemoglobin A1c  4. Overweight (BMI 25.0-29.9)  She was congratulated on maintaining her weight loss during COVID quarantine. She is encouraged to continue with her regular exercise regimen.    5. Screen for colon cancer  - Ambulatory referral to Gastroenterology    [name], MD    THE PATIENT IS ENCOURAGED TO PRACTICE SOCIAL DISTANCING DUE TO THE COVID-19 PANDEMIC.

## 2018-11-22 LAB — CMP14+EGFR
ALT: 13 IU/L (ref 0–32)
AST: 20 IU/L (ref 0–40)
Albumin/Globulin Ratio: 1.3 (ref 1.2–2.2)
Albumin: 4.4 g/dL (ref 3.8–4.8)
Alkaline Phosphatase: 88 IU/L (ref 39–117)
BUN/Creatinine Ratio: 9 (ref 9–23)
BUN: 7 mg/dL (ref 6–24)
Bilirubin Total: 0.4 mg/dL (ref 0.0–1.2)
CO2: 24 mmol/L (ref 20–29)
Calcium: 9.3 mg/dL (ref 8.7–10.2)
Chloride: 101 mmol/L (ref 96–106)
Creatinine, Ser: 0.75 mg/dL (ref 0.57–1.00)
GFR calc Af Amer: 108 mL/min/{1.73_m2} (ref >59)
GFR calc non Af Amer: 94 mL/min/{1.73_m2} (ref >59)
Globulin, Total: 3.3 g/dL (ref 1.5–4.5)
Glucose: 91 mg/dL (ref 65–99)
Potassium: 4.1 mmol/L (ref 3.5–5.2)
Sodium: 140 mmol/L (ref 134–144)
Total Protein: 7.7 g/dL (ref 6.0–8.5)

## 2018-11-22 LAB — T4, FREE: Free T4: 1.39 ng/dL (ref 0.82–1.77)

## 2018-11-22 LAB — HEMOGLOBIN A1C
Est. average glucose Bld gHb Est-mCnc: 123 mg/dL
Hgb A1c MFr Bld: 5.9 % — ABNORMAL HIGH (ref 4.8–5.6)

## 2018-11-22 LAB — TSH: TSH: 2.8 u[IU]/mL (ref 0.450–4.500)

## 2018-11-26 ENCOUNTER — Ambulatory Visit: Admitting: Internal Medicine

## 2018-12-20 ENCOUNTER — Other Ambulatory Visit: Payer: Self-pay

## 2018-12-20 MED ORDER — LISINOPRIL 20 MG PO TABS: ORAL_TABLET | ORAL | 1 refills | Status: DC

## 2019-02-10 LAB — HM COLONOSCOPY

## 2019-02-13 ENCOUNTER — Encounter: Payer: Self-pay | Admitting: Internal Medicine

## 2019-03-08 ENCOUNTER — Encounter: Payer: Self-pay | Admitting: Internal Medicine

## 2019-03-21 ENCOUNTER — Other Ambulatory Visit (HOSPITAL_BASED_OUTPATIENT_CLINIC_OR_DEPARTMENT_OTHER): Payer: Self-pay | Admitting: Internal Medicine

## 2019-03-21 DIAGNOSIS — Z1231 Encounter for screening mammogram for malignant neoplasm of breast: Secondary | ICD-10-CM

## 2019-03-27 ENCOUNTER — Encounter (HOSPITAL_BASED_OUTPATIENT_CLINIC_OR_DEPARTMENT_OTHER): Payer: Self-pay

## 2019-03-27 ENCOUNTER — Other Ambulatory Visit: Payer: Self-pay

## 2019-03-27 ENCOUNTER — Ambulatory Visit (HOSPITAL_BASED_OUTPATIENT_CLINIC_OR_DEPARTMENT_OTHER)
Admission: RE | Admit: 2019-03-27 | Discharge: 2019-03-27 | Disposition: A | Discharge status: Home / Self Care | Source: Ambulatory Visit | Attending: Internal Medicine | Admitting: Internal Medicine

## 2019-03-27 DIAGNOSIS — Z1231 Encounter for screening mammogram for malignant neoplasm of breast: Secondary | ICD-10-CM | POA: Insufficient documentation

## 2019-03-30 ENCOUNTER — Encounter: Payer: Self-pay | Admitting: Internal Medicine

## 2019-03-30 ENCOUNTER — Other Ambulatory Visit: Payer: Self-pay | Admitting: Obstetrics & Gynecology

## 2019-03-30 ENCOUNTER — Other Ambulatory Visit

## 2019-03-30 ENCOUNTER — Encounter

## 2019-03-30 DIAGNOSIS — N6459 Other signs and symptoms in breast: Secondary | ICD-10-CM

## 2019-04-05 ENCOUNTER — Ambulatory Visit
Admission: RE | Admit: 2019-04-05 | Discharge: 2019-04-05 | Disposition: A | Discharge status: Home / Self Care | Source: Ambulatory Visit | Attending: Obstetrics & Gynecology | Admitting: Obstetrics & Gynecology

## 2019-04-05 ENCOUNTER — Other Ambulatory Visit: Payer: Self-pay

## 2019-04-05 DIAGNOSIS — N6459 Other signs and symptoms in breast: Secondary | ICD-10-CM

## 2019-06-08 ENCOUNTER — Other Ambulatory Visit: Payer: Self-pay

## 2019-06-08 ENCOUNTER — Ambulatory Visit (INDEPENDENT_AMBULATORY_CARE_PROVIDER_SITE_OTHER): Admitting: Internal Medicine

## 2019-06-08 ENCOUNTER — Encounter: Payer: Self-pay | Admitting: Internal Medicine

## 2019-06-08 VITALS — BP 120/62 | HR 73 | Temp 97.9°F | Ht 68.2 in | Wt 179.0 lb

## 2019-06-08 DIAGNOSIS — E039 Hypothyroidism, unspecified: Secondary | ICD-10-CM

## 2019-06-08 DIAGNOSIS — E663 Overweight: Secondary | ICD-10-CM

## 2019-06-08 DIAGNOSIS — Z0000 Encounter for general adult medical examination without abnormal findings: Secondary | ICD-10-CM | POA: Diagnosis not present

## 2019-06-08 DIAGNOSIS — I10 Essential (primary) hypertension: Secondary | ICD-10-CM | POA: Diagnosis not present

## 2019-06-08 DIAGNOSIS — R7309 Other abnormal glucose: Secondary | ICD-10-CM | POA: Diagnosis not present

## 2019-06-08 DIAGNOSIS — Z6827 Body mass index (BMI) 27.0-27.9, adult: Secondary | ICD-10-CM

## 2019-06-08 LAB — POCT URINALYSIS DIPSTICK
Bilirubin, UA: NEGATIVE
Glucose, UA: NEGATIVE
Ketones, UA: NEGATIVE
Leukocytes, UA: NEGATIVE
Nitrite, UA: NEGATIVE
Protein, UA: NEGATIVE
Spec Grav, UA: 1.02 (ref 1.010–1.025)
Urobilinogen, UA: 0.2 E.U./dL
pH, UA: 7 (ref 5.0–8.0)

## 2019-06-08 LAB — POCT UA - MICROALBUMIN
Albumin/Creatinine Ratio, Urine, POC: <30
Creatinine, POC: 200 mg/dL
Microalbumin Ur, POC: 30 mg/L

## 2019-06-08 NOTE — Progress Notes (Signed)
This visit occurred during the SARS-CoV-2 public health emergency.  Safety protocols were in place, including screening questions prior to the visit, additional usage of staff PPE, and extensive cleaning of exam room while observing appropriate contact time as indicated for disinfecting solutions.  Subjective:     Patient ID: Shannon Kane , female    DOB: [birth year] , 49 y.o.   MRN: [id number]   Chief Complaint  Patient presents with  . Annual Exam  . Hypertension    HPI  She is here today for a full physical examination.  She is followed by GYN for her pelvic exams. She was last seen in Nov 2020. She has no specific concerns or complaints at this time.   Hypertension This is a chronic problem. The current episode started more than 1 year ago. The problem has been gradually improving since onset. The problem is controlled. Pertinent negatives include no blurred vision, chest pain, palpitations or shortness of breath. Past treatments include angiotensin blockers. The current treatment provides moderate improvement. There are no compliance problems.      Past Medical History:  Diagnosis Date  . Abnormal glucose   . Headache   . Hypothyroidism   . Iron deficiency anemia   . Palpitation   . Spontaneous ecchymoses   . Tachycardia   . Vitamin D deficiency      Family History  Problem Relation Age of Onset  . Diabetes Mother   . Hypertension Mother   . Leukemia Father      Current Outpatient Medications:  .  Cholecalciferol (VITAMIN D-3 PO), Take 6,000 Units by mouth daily. , Disp: , Rfl:  .  lisinopril (ZESTRIL) 20 MG tablet, Take 1/2 tablet by mouth  daily, Disp: 30 tablet, Rfl: 1 .  MAGNESIUM CITRATE PO, Take 500 mcg by mouth. Only when having trouble sleeping, Disp: , Rfl:  .  SYNTHROID 75 MCG tablet, TAKE 1 TABLET ON MONDAY THROUGH SUNDAY, Disp: 90 tablet, Rfl: 3   Allergies  Allergen Reactions  . Amoxicillin   . Ciprofloxacin   . Penicillins      Review of Systems   Constitutional: Negative.   HENT: Negative.   Eyes: Negative.  Negative for blurred vision.  Respiratory: Negative.  Negative for shortness of breath.   Cardiovascular: Negative.  Negative for chest pain and palpitations.  Endocrine: Negative.   Genitourinary: Negative.   Musculoskeletal: Negative.   Skin: Negative.   Allergic/Immunologic: Negative.   Neurological: Negative.   Hematological: Negative.   Psychiatric/Behavioral: Negative.      Today's Vitals   06/08/19 0934  BP: 120/62  Pulse: 73  Temp: 97.9 F (36.6 C)  TempSrc: Oral  Weight: 179 lb (81.2 kg)  Height: 5' 8.2" (1.732 m)   Body mass index is 27.06 kg/m.   Objective:  Physical Exam Vitals and nursing note reviewed.  Constitutional:      Appearance: Normal appearance.  HENT:     Head: Normocephalic and atraumatic.     Right Ear: Tympanic membrane, ear canal and external ear normal.     Left Ear: Tympanic membrane, ear canal and external ear normal.     Nose:     Comments: Deferred, masked    Mouth/Throat:     Comments: Deferred, masked Eyes:     Extraocular Movements: Extraocular movements intact.     Conjunctiva/sclera: Conjunctivae normal.     Pupils: Pupils are equal, round, and reactive to light.  Cardiovascular:     Rate and Rhythm: Normal rate  and regular rhythm.     Pulses: Normal pulses.     Heart sounds: Normal heart sounds.  Pulmonary:     Effort: Pulmonary effort is normal.     Breath sounds: Normal breath sounds.  Chest:     Breasts: Tanner Score is 5.        Right: Normal.        Left: Normal.  Abdominal:     General: Abdomen is flat. Bowel sounds are normal.     Palpations: Abdomen is soft.  Genitourinary:    Comments: deferred Musculoskeletal:        General: Normal range of motion.     Cervical back: Normal range of motion and neck supple.  Skin:    General: Skin is warm and dry.  Neurological:     General: No focal deficit present.     Mental Status: She is alert and  oriented to person, place, and time.  Psychiatric:        Mood and Affect: Mood normal.        Behavior: Behavior normal.         Assessment And Plan:     1. Routine general medical examination at health care facility  A full exam was performed.  Importance of monthly self breast exams was discussed with the patient. PATIENT IS ADVISED TO GET 30-45 MINUTES REGULAR EXERCISE NO LESS THAN FOUR TO FIVE DAYS PER WEEK - BOTH WEIGHTBEARING EXERCISES AND AEROBIC ARE RECOMMENDED.  SHE IS ADVISED TO FOLLOW A HEALTHY DIET WITH AT LEAST SIX FRUITS/VEGGIES PER DAY, DECREASE INTAKE OF RED MEAT, AND TO INCREASE FISH INTAKE TO TWO DAYS PER WEEK.  MEATS/FISH SHOULD NOT BE FRIED, BAKED OR BROILED IS PREFERABLE.  I SUGGEST WEARING SPF 50 SUNSCREEN ON EXPOSED PARTS AND ESPECIALLY WHEN IN THE DIRECT SUNLIGHT FOR AN EXTENDED PERIOD OF TIME.  PLEASE AVOID FAST FOOD RESTAURANTS AND INCREASE YOUR WATER INTAKE.   2. Essential hypertension, benign  Chronic, well controlled. She will continue with current meds for now. She is encouraged to avoid adding salt to her foods.  EKG performed, NSR w/ HR and LAE - no new changes. She will rto in six months for re-evaluation.   - POCT Urinalysis Dipstick (81002) - POCT UA - Microalbumin - EKG 12-Lead - CMP14+EGFR - CBC - Lipid panel  3. Primary hypothyroidism  I will check thyroid panel and adjust meds as needed.  - TSH - T4, Free  4. Other abnormal glucose  HER A1C HAS BEEN ELEVATED IN THE PAST. I WILL CHECK AN A1C, BMET TODAY. SHE WAS ENCOURAGED TO AVOID SUGARY BEVERAGES AND PROCESSED FOODS INCLUDNG BREADS, RICE AND PASTA.  - Hemoglobin A1c  5. Overweight (BMI 25.0-29.9)  Wt Readings from Last 3 Encounters:  06/08/19 179 lb (81.2 kg)  11/21/18 181 lb 3.2 oz (82.2 kg)  07/11/18 180 lb (81.6 kg)   BMI 27.  She was congratulated on her continued lifestyle changes and encouraged to keep up the great work. Her goal is to eventually wean off her BP meds  completely. Pt advised that it is not yet time, but I will agree to wean her off when appropriate. I will reassess at her next visit in six months.   [name], MD    THE PATIENT IS ENCOURAGED TO PRACTICE SOCIAL DISTANCING DUE TO THE COVID-19 PANDEMIC.

## 2019-06-08 NOTE — Patient Instructions (Signed)
Health Maintenance, Female Adopting a healthy lifestyle and getting preventive care are important in promoting health and wellness. Ask your health care provider about:  The right schedule for you to have regular tests and exams.  Things you can do on your own to prevent diseases and keep yourself healthy. What should I know about diet, weight, and exercise? Eat a healthy diet   Eat a diet that includes plenty of vegetables, fruits, low-fat dairy products, and lean protein.  Do not eat a lot of foods that are high in solid fats, added sugars, or sodium. Maintain a healthy weight Body mass index (BMI) is used to identify weight problems. It estimates body fat based on height and weight. Your health care provider can help determine your BMI and help you achieve or maintain a healthy weight. Get regular exercise Get regular exercise. This is one of the most important things you can do for your health. Most adults should:  Exercise for at least 150 minutes each week. The exercise should increase your heart rate and make you sweat (moderate-intensity exercise).  Do strengthening exercises at least twice a week. This is in addition to the moderate-intensity exercise.  Spend less time sitting. Even light physical activity can be beneficial. Watch cholesterol and blood lipids Have your blood tested for lipids and cholesterol at 51 years of age, then have this test every 5 years. Have your cholesterol levels checked more often if:  Your lipid or cholesterol levels are high.  You are older than 51 years of age.  You are at high risk for heart disease. What should I know about cancer screening? Depending on your health history and family history, you may need to have cancer screening at various ages. This may include screening for:  Breast cancer.  Cervical cancer.  Colorectal cancer.  Skin cancer.  Lung cancer. What should I know about heart disease, diabetes, and high blood  pressure? Blood pressure and heart disease  High blood pressure causes heart disease and increases the risk of stroke. This is more likely to develop in people who have high blood pressure readings, are of African descent, or are overweight.  Have your blood pressure checked: ? Every 3-5 years if you are 18-39 years of age. ? Every year if you are 40 years old or older. Diabetes Have regular diabetes screenings. This checks your fasting blood sugar level. Have the screening done:  Once every three years after age 40 if you are at a normal weight and have a low risk for diabetes.  More often and at a younger age if you are overweight or have a high risk for diabetes. What should I know about preventing infection? Hepatitis B If you have a higher risk for hepatitis B, you should be screened for this virus. Talk with your health care provider to find out if you are at risk for hepatitis B infection. Hepatitis C Testing is recommended for:  Everyone born from 1945 through 1965.  Anyone with known risk factors for hepatitis C. Sexually transmitted infections (STIs)  Get screened for STIs, including gonorrhea and chlamydia, if: ? You are sexually active and are younger than 51 years of age. ? You are older than 51 years of age and your health care provider tells you that you are at risk for this type of infection. ? Your sexual activity has changed since you were last screened, and you are at increased risk for chlamydia or gonorrhea. Ask your health care provider if   you are at risk.  Ask your health care provider about whether you are at high risk for HIV. Your health care provider may recommend a prescription medicine to help prevent HIV infection. If you choose to take medicine to prevent HIV, you should first get tested for HIV. You should then be tested every 3 months for as long as you are taking the medicine. Pregnancy  If you are about to stop having your period (premenopausal) and  you may become pregnant, seek counseling before you get pregnant.  Take 400 to 800 micrograms (mcg) of folic acid every day if you become pregnant.  Ask for birth control (contraception) if you want to prevent pregnancy. Osteoporosis and menopause Osteoporosis is a disease in which the bones lose minerals and strength with aging. This can result in bone fractures. If you are 65 years old or older, or if you are at risk for osteoporosis and fractures, ask your health care provider if you should:  Be screened for bone loss.  Take a calcium or vitamin D supplement to lower your risk of fractures.  Be given hormone replacement therapy (HRT) to treat symptoms of menopause. Follow these instructions at home: Lifestyle  Do not use any products that contain nicotine or tobacco, such as cigarettes, e-cigarettes, and chewing tobacco. If you need help quitting, ask your health care provider.  Do not use street drugs.  Do not share needles.  Ask your health care provider for help if you need support or information about quitting drugs. Alcohol use  Do not drink alcohol if: ? Your health care provider tells you not to drink. ? You are pregnant, may be pregnant, or are planning to become pregnant.  If you drink alcohol: ? Limit how much you use to 0-1 drink a day. ? Limit intake if you are breastfeeding.  Be aware of how much alcohol is in your drink. In the [place] one drink equals one 12 oz bottle of beer (355 mL), one 5 oz glass of wine (148 mL), or one 1 oz glass of hard liquor (44 mL). General instructions  Schedule regular health, dental, and eye exams.  Stay current with your vaccines.  Tell your health care provider if: ? You often feel depressed. ? You have ever been abused or do not feel safe at home. Summary  Adopting a healthy lifestyle and getting preventive care are important in promoting health and wellness.  Follow your health care provider's instructions about healthy  diet, exercising, and getting tested or screened for diseases.  Follow your health care provider's instructions on monitoring your cholesterol and blood pressure. This information is not intended to replace advice given to you by your health care provider. Make sure you discuss any questions you have with your health care provider. Document Revised: 04/27/2018 Document Reviewed: 04/27/2018 Elsevier Patient Education  2020 [employer]  

## 2019-06-09 LAB — CMP14+EGFR
ALT: 13 IU/L (ref 0–32)
AST: 20 IU/L (ref 0–40)
Albumin/Globulin Ratio: 1.3 (ref 1.2–2.2)
Albumin: 4.4 g/dL (ref 3.8–4.8)
Alkaline Phosphatase: 99 IU/L (ref 39–117)
BUN/Creatinine Ratio: 13 (ref 9–23)
BUN: 9 mg/dL (ref 6–24)
Bilirubin Total: 0.5 mg/dL (ref 0.0–1.2)
CO2: 23 mmol/L (ref 20–29)
Calcium: 9.6 mg/dL (ref 8.7–10.2)
Chloride: 103 mmol/L (ref 96–106)
Creatinine, Ser: 0.7 mg/dL (ref 0.57–1.00)
GFR calc Af Amer: 117 mL/min/{1.73_m2} (ref >59)
GFR calc non Af Amer: 101 mL/min/{1.73_m2} (ref >59)
Globulin, Total: 3.3 g/dL (ref 1.5–4.5)
Glucose: 84 mg/dL (ref 65–99)
Potassium: 4 mmol/L (ref 3.5–5.2)
Sodium: 139 mmol/L (ref 134–144)
Total Protein: 7.7 g/dL (ref 6.0–8.5)

## 2019-06-09 LAB — CBC
Hematocrit: 40.8 % (ref 34.0–46.6)
Hemoglobin: 13.9 g/dL (ref 11.1–15.9)
MCH: 30.8 pg (ref 26.6–33.0)
MCHC: 34.1 g/dL (ref 31.5–35.7)
MCV: 91 fL (ref 79–97)
Platelets: 306 10*3/uL (ref 150–450)
RBC: 4.51 x10E6/uL (ref 3.77–5.28)
RDW: 12.6 % (ref 11.7–15.4)
WBC: 6.8 10*3/uL (ref 3.4–10.8)

## 2019-06-09 LAB — LIPID PANEL
Chol/HDL Ratio: 2.5 ratio (ref 0.0–4.4)
Cholesterol, Total: 168 mg/dL (ref 100–199)
HDL: 68 mg/dL (ref >39)
LDL Chol Calc (NIH): 89 mg/dL (ref 0–99)
Triglycerides: 52 mg/dL (ref 0–149)
VLDL Cholesterol Cal: 11 mg/dL (ref 5–40)

## 2019-06-09 LAB — HEMOGLOBIN A1C
Est. average glucose Bld gHb Est-mCnc: 114 mg/dL
Hgb A1c MFr Bld: 5.6 % (ref 4.8–5.6)

## 2019-06-09 LAB — T4, FREE: Free T4: 1.46 ng/dL (ref 0.82–1.77)

## 2019-06-09 LAB — TSH: TSH: 1.86 u[IU]/mL (ref 0.450–4.500)

## 2019-07-28 ENCOUNTER — Other Ambulatory Visit: Payer: Self-pay | Admitting: Internal Medicine

## 2019-08-03 ENCOUNTER — Ambulatory Visit: Attending: Family

## 2019-08-03 DIAGNOSIS — Z23 Encounter for immunization: Secondary | ICD-10-CM

## 2019-08-03 NOTE — Progress Notes (Signed)
   Covid-19 Vaccination Clinic  Name:  Shannon Kane    MRN: [id number] DOB: 01/18/1971  08/03/2019  [name] was observed post Covid-19 immunization for 15 minutes without incident. She was provided with Vaccine Information Sheet and instruction to access the V-Safe system.   [name] was instructed to call 911 with any severe reactions post vaccine: Marland Kitchen Difficulty breathing  . Swelling of face and throat  . A fast heartbeat  . A bad rash all over body  . Dizziness and weakness   Immunizations Administered    Name Date Dose VIS Date Route   Moderna COVID-19 Vaccine 08/03/2019  1:18 PM 0.5 mL 04/18/2019 Intramuscular   Manufacturer: Moderna   Lot: [id number]   [id number]

## 2019-09-05 ENCOUNTER — Ambulatory Visit: Attending: Family

## 2019-09-05 DIAGNOSIS — Z23 Encounter for immunization: Secondary | ICD-10-CM

## 2019-09-05 NOTE — Progress Notes (Signed)
   Covid-19 Vaccination Clinic  Name:  Shannon Kane    MRN: [id number] DOB: 02-28-1971  09/05/2019  [name] was observed post Covid-19 immunization for 15 minutes without incident. She was provided with Vaccine Information Sheet and instruction to access the V-Safe system.   [name] was instructed to call 911 with any severe reactions post vaccine: Marland Kitchen Difficulty breathing  . Swelling of face and throat  . A fast heartbeat  . A bad rash all over body  . Dizziness and weakness   Immunizations Administered    Name Date Dose VIS Date Route   Moderna COVID-19 Vaccine 09/05/2019 11:41 AM 0.5 mL 04/2019 Intramuscular   Manufacturer: Moderna   Lot: [id number]   [id number]

## 2019-12-05 ENCOUNTER — Encounter: Payer: Self-pay | Admitting: Internal Medicine

## 2019-12-05 ENCOUNTER — Other Ambulatory Visit: Payer: Self-pay

## 2019-12-05 ENCOUNTER — Ambulatory Visit (INDEPENDENT_AMBULATORY_CARE_PROVIDER_SITE_OTHER): Admitting: Internal Medicine

## 2019-12-05 VITALS — BP 144/92 | HR 77 | Temp 98.0°F | Ht 68.2 in | Wt 179.8 lb

## 2019-12-05 DIAGNOSIS — I10 Essential (primary) hypertension: Secondary | ICD-10-CM | POA: Diagnosis not present

## 2019-12-05 DIAGNOSIS — L659 Nonscarring hair loss, unspecified: Secondary | ICD-10-CM

## 2019-12-05 DIAGNOSIS — E039 Hypothyroidism, unspecified: Secondary | ICD-10-CM | POA: Diagnosis not present

## 2019-12-05 DIAGNOSIS — E663 Overweight: Secondary | ICD-10-CM

## 2019-12-05 DIAGNOSIS — R202 Paresthesia of skin: Secondary | ICD-10-CM

## 2019-12-05 NOTE — Patient Instructions (Addendum)
Shannon Kane    Vitamin B12 Deficiency Vitamin B12 deficiency occurs when the body does not have enough vitamin B12, which is an important vitamin. The body needs this vitamin:  To make red blood cells.  To make DNA. This is the genetic material inside cells.  To help the nerves work properly so they can carry messages from the brain to the body. Vitamin B12 deficiency can cause various health problems, such as a low red blood cell count (anemia) or nerve damage. What are the causes? This condition may be caused by:  Not eating enough foods that contain vitamin B12.  Not having enough stomach acid and digestive fluids to properly absorb vitamin B12 from the food that you eat.  Certain digestive system diseases that make it hard to absorb vitamin B12. These diseases include Crohn's disease, chronic pancreatitis, and cystic fibrosis.  A condition in which the body does not make enough of a protein (intrinsic factor), resulting in too few red blood cells (pernicious anemia).  Having a surgery in which part of the stomach or small intestine is removed.  Taking certain medicines that make it hard for the body to absorb vitamin B12. These medicines include: ? Heartburn medicines (antacids and proton pump inhibitors). ? Certain antibiotic medicines. ? Some medicines that are used to treat diabetes, tuberculosis, gout, or high cholesterol. What increases the risk? The following factors may make you more likely to develop a B12 deficiency:  Being older than age 39.  Eating a vegetarian or vegan diet, especially while you are pregnant.  Eating a poor diet while you are pregnant.  Taking certain medicines.  Having alcoholism. What are the signs or symptoms? In some cases, there are no symptoms of this condition. If the condition leads to anemia or nerve damage, various symptoms can occur, such as:  Weakness.  Fatigue.  Loss of appetite.  Weight loss.  Numbness or  tingling in your hands and feet.  Redness and burning of the tongue.  Confusion or memory problems.  Depression.  Sensory problems, such as color blindness, ringing in the ears, or loss of taste.  Diarrhea or constipation.  Trouble walking. If anemia is severe, symptoms can include:  Shortness of breath.  Dizziness.  Rapid heart rate (tachycardia). How is this diagnosed? This condition may be diagnosed with a blood test to measure the level of vitamin B12 in your blood. You may also have other tests, including:  A group of tests that measure certain characteristics of blood cells (complete blood count, CBC).  A blood test to measure intrinsic factor.  A procedure where a thin tube with a camera on the end is used to look into your stomach or intestines (endoscopy). Other tests may be needed to discover the cause of B12 deficiency. How is this treated? Treatment for this condition depends on the cause. This condition may be treated by:  Changing your eating and drinking habits, such as: ? Eating more foods that contain vitamin B12. ? Drinking less alcohol or no alcohol.  Getting vitamin B12 injections.  Taking vitamin B12 supplements. Your health care provider will tell you which dosage is best for you. Follow these instructions at home: Eating and drinking   Eat lots of healthy foods that contain vitamin B12, including: ? Meats and poultry. This includes beef, pork, chicken, [place], and organ meats, such as liver. ? Seafood. This includes clams, rainbow trout, salmon, tuna, and haddock. ? Eggs. ? Cereal and dairy products that  are fortified. This means that vitamin B12 has been added to the food. Check the label on the package to see if the food is fortified. The items listed above may not be a complete list of recommended foods and beverages. Contact a dietitian for more information. General instructions  Get any injections that are prescribed by your health care  provider.  Take supplements only as told by your health care provider. Follow the directions carefully.  Do not drink alcohol if your health care provider tells you not to. In some cases, you may only be asked to limit alcohol use.  Keep all follow-up visits as told by your health care provider. This is important. Contact a health care provider if:  Your symptoms come back. Get help right away if you:  Develop shortness of breath.  Have a rapid heart rate.  Have chest pain.  Become dizzy or lose consciousness. Summary  Vitamin B12 deficiency occurs when the body does not have enough vitamin B12.  The main causes of vitamin B12 deficiency include dietary deficiency, digestive diseases, pernicious anemia, and having a surgery in which part of the stomach or small intestine is removed.  In some cases, there are no symptoms of this condition. If the condition leads to anemia or nerve damage, various symptoms can occur, such as weakness, shortness of breath, and numbness.  Treatment may include getting vitamin B12 injections or taking vitamin B12 supplements. Eat lots of healthy foods that contain vitamin B12. This information is not intended to replace advice given to you by your health care provider. Make sure you discuss any questions you have with your health care provider. Document Revised: 10/21/2018 Document Reviewed: 01/11/2018 Elsevier Patient Education  2020 [employer].

## 2019-12-05 NOTE — Progress Notes (Signed)
I,Katawbba Wiggins,acting as a Education administrator for [name], MD.,have documented all relevant documentation on the behalf of [name], MD,as directed by  [name], MD while in the presence of [name], MD.  This visit occurred during the SARS-CoV-2 public health emergency.  Safety protocols were in place, including screening questions prior to the visit, additional usage of staff PPE, and extensive cleaning of exam room while observing appropriate contact time as indicated for disinfecting solutions.  Subjective:     Patient ID: Shannon Kane , female    DOB: 12-15-1970 , 49 y.o.   MRN: [id number]   Chief Complaint  Patient presents with  . Hypertension  . Thyroid Problem    HPI  The patient is here today for a follow-up for her blood pressure.  She reports compliance with meds. She denies having headaches, chest pain and shortness of breath.   Hypertension This is a chronic problem. The current episode started more than 1 year ago. The problem has been gradually improving since onset. The problem is controlled. Pertinent negatives include no blurred vision, chest pain, orthopnea, palpitations or shortness of breath. Past treatments include ACE inhibitors. The current treatment provides moderate improvement. Identifiable causes of hypertension include a thyroid problem.  Thyroid Problem Presents for follow-up visit. Patient reports no cold intolerance, leg swelling, palpitations or weight gain. The symptoms have been stable.     Past Medical History:  Diagnosis Date  . Abnormal glucose   . Headache   . Hypothyroidism   . Iron deficiency anemia   . Palpitation   . Spontaneous ecchymoses   . Tachycardia   . Vitamin D deficiency      Family History  Problem Relation Age of Onset  . Diabetes Mother   . Hypertension Mother   . Leukemia Father      Current Outpatient Medications:  .  Cholecalciferol (VITAMIN D-3 PO), Take 6,000 Units by mouth daily. , Disp: ,  Rfl:  .  lisinopril (ZESTRIL) 20 MG tablet, Take 1/2 tablet by mouth  daily, Disp: 30 tablet, Rfl: 1 .  MAGNESIUM CITRATE PO, Take 500 mcg by mouth. Only when having trouble sleeping, Disp: , Rfl:  .  SYNTHROID 75 MCG tablet, TAKE 1 TABLET ON MONDAY THROUGH SUNDAY, Disp: 90 tablet, Rfl: 3   Allergies  Allergen Reactions  . Amoxicillin   . Ciprofloxacin   . Penicillins      Review of Systems  Constitutional: Negative.  Negative for weight gain.  Eyes: Negative for blurred vision.  Respiratory: Negative.  Negative for shortness of breath.   Cardiovascular: Negative.  Negative for chest pain, palpitations and orthopnea.  Gastrointestinal: Negative.   Endocrine: Negative for cold intolerance.  Skin:       She reports having some hair loss.  She first noticed this in Feb. There was one patch of hair that appeared to be breaking off. She denies change in her appetite and sleep habits. She denies having any extra stress.   Neurological: Negative.        She admits to having tingling in her b/l lower extremities. Usually occurs at night. Not able to determine any other triggers.  Psychiatric/Behavioral: Negative.   All other systems reviewed and are negative.    Today's Vitals   12/05/19 1551  BP: (!) 144/92  Pulse: 77  Temp: 98 F (36.7 C)  TempSrc: Oral  Weight: 179 lb 12.8 oz (81.6 kg)  Height: 5' 8.2" (1.732 m)   Body  mass index is 27.18 kg/m.  Wt Readings from Last 3 Encounters:  12/05/19 179 lb 12.8 oz (81.6 kg)  06/08/19 179 lb (81.2 kg)  11/21/18 181 lb 3.2 oz (82.2 kg)   Objective:  Physical Exam Vitals and nursing note reviewed.  Constitutional:      Appearance: Normal appearance. She is obese.  HENT:     Head: Normocephalic and atraumatic.  Cardiovascular:     Rate and Rhythm: Normal rate and regular rhythm.     Heart sounds: Normal heart sounds.  Pulmonary:     Breath sounds: Normal breath sounds.  Skin:    General: Skin is warm.     Comments: Area of  hair thinning lateral to crown on right. No scalp lesions noted. No erythema noted.   Neurological:     General: No focal deficit present.     Mental Status: She is alert and oriented to person, place, and time.         Assessment And Plan:     1. Essential hypertension, benign  Comments: Chronic, uncontrolled today. She had to return to the office today, she was not looking forward to that.   - CMP14+EGFR  2. Primary hypothyroidism  I will check thyroid panel and adjust meds as needed.  - TSH - CBC no Diff - T4, Free  3. Hair loss  Comments: I will check ANA and thyroid function today. She is encouraged to consider evaluation by hair/scalp specialist, or trichologist.   - ANA, IFA (with reflex)  4. Overweight (BMI 25.0-29.9)  Comments: BMI 27. Her weight is stable. She is encouraged to aim for at least 150 minutes of moderate exercise per week.   5. Paresthesia of both lower extremities  - Vitamin B12     Patient was given opportunity to ask questions. Patient verbalized understanding of the plan and was able to repeat key elements of the plan. All questions were answered to their satisfaction.  [name], MD   I, [name], MD, have reviewed all documentation for this visit. The documentation on 12/05/19 for the exam, diagnosis, procedures, and orders are all accurate and complete.  THE PATIENT IS ENCOURAGED TO PRACTICE SOCIAL DISTANCING DUE TO THE COVID-19 PANDEMIC.

## 2019-12-06 LAB — CBC
Hematocrit: 40.5 % (ref 34.0–46.6)
Hemoglobin: 13.9 g/dL (ref 11.1–15.9)
MCH: 30.3 pg (ref 26.6–33.0)
MCHC: 34.3 g/dL (ref 31.5–35.7)
MCV: 88 fL (ref 79–97)
Platelets: 316 10*3/uL (ref 150–450)
RBC: 4.59 x10E6/uL (ref 3.77–5.28)
RDW: 12.4 % (ref 11.7–15.4)
WBC: 6.4 10*3/uL (ref 3.4–10.8)

## 2019-12-06 LAB — CMP14+EGFR
ALT: 13 IU/L (ref 0–32)
AST: 17 IU/L (ref 0–40)
Albumin/Globulin Ratio: 1.4 (ref 1.2–2.2)
Albumin: 4.5 g/dL (ref 3.8–4.8)
Alkaline Phosphatase: 100 IU/L (ref 48–121)
BUN/Creatinine Ratio: 11 (ref 9–23)
BUN: 9 mg/dL (ref 6–24)
Bilirubin Total: 0.5 mg/dL (ref 0.0–1.2)
CO2: 25 mmol/L (ref 20–29)
Calcium: 9.9 mg/dL (ref 8.7–10.2)
Chloride: 102 mmol/L (ref 96–106)
Creatinine, Ser: 0.79 mg/dL (ref 0.57–1.00)
GFR calc Af Amer: 101 mL/min/{1.73_m2} (ref >59)
GFR calc non Af Amer: 88 mL/min/{1.73_m2} (ref >59)
Globulin, Total: 3.3 g/dL (ref 1.5–4.5)
Glucose: 83 mg/dL (ref 65–99)
Potassium: 4.1 mmol/L (ref 3.5–5.2)
Sodium: 140 mmol/L (ref 134–144)
Total Protein: 7.8 g/dL (ref 6.0–8.5)

## 2019-12-06 LAB — T4, FREE: Free T4: 1.43 ng/dL (ref 0.82–1.77)

## 2019-12-06 LAB — TSH: TSH: 2.29 u[IU]/mL (ref 0.450–4.500)

## 2019-12-06 LAB — ANTINUCLEAR ANTIBODIES, IFA: ANA Titer 1: NEGATIVE

## 2019-12-06 LAB — VITAMIN B12: Vitamin B-12: 345 pg/mL (ref 232–1245)

## 2019-12-07 ENCOUNTER — Ambulatory Visit: Admitting: Internal Medicine

## 2019-12-09 ENCOUNTER — Encounter: Payer: Self-pay | Admitting: Internal Medicine

## 2019-12-10 LAB — FERRITIN: Ferritin: 49 ng/mL (ref 15–150)

## 2019-12-10 LAB — SPECIMEN STATUS REPORT

## 2019-12-10 LAB — METHYLMALONIC ACID, SERUM: Methylmalonic Acid: 120 nmol/L (ref 0–378)

## 2020-02-29 ENCOUNTER — Other Ambulatory Visit: Payer: Self-pay | Admitting: Pulmonary Disease

## 2020-02-29 ENCOUNTER — Other Ambulatory Visit: Payer: Self-pay | Admitting: Obstetrics & Gynecology

## 2020-02-29 DIAGNOSIS — N6320 Unspecified lump in the left breast, unspecified quadrant: Secondary | ICD-10-CM

## 2020-03-14 ENCOUNTER — Ambulatory Visit
Admission: RE | Admit: 2020-03-14 | Discharge: 2020-03-14 | Disposition: A | Discharge status: Home / Self Care | Source: Ambulatory Visit | Attending: Obstetrics & Gynecology | Admitting: Obstetrics & Gynecology

## 2020-03-14 ENCOUNTER — Other Ambulatory Visit: Payer: Self-pay

## 2020-03-14 ENCOUNTER — Other Ambulatory Visit: Payer: Self-pay | Admitting: Obstetrics & Gynecology

## 2020-03-14 DIAGNOSIS — N6310 Unspecified lump in the right breast, unspecified quadrant: Secondary | ICD-10-CM

## 2020-03-14 DIAGNOSIS — N6320 Unspecified lump in the left breast, unspecified quadrant: Secondary | ICD-10-CM

## 2020-03-18 DIAGNOSIS — C50919 Malignant neoplasm of unspecified site of unspecified female breast: Secondary | ICD-10-CM

## 2020-03-18 HISTORY — DX: Malignant neoplasm of unspecified site of unspecified female breast: C50.919

## 2020-03-21 ENCOUNTER — Other Ambulatory Visit

## 2020-03-21 ENCOUNTER — Encounter

## 2020-03-26 ENCOUNTER — Ambulatory Visit
Admission: RE | Admit: 2020-03-26 | Discharge: 2020-03-26 | Disposition: A | Discharge status: Home / Self Care | Source: Ambulatory Visit | Attending: Obstetrics & Gynecology | Admitting: Obstetrics & Gynecology

## 2020-03-26 ENCOUNTER — Other Ambulatory Visit: Payer: Self-pay

## 2020-03-26 ENCOUNTER — Other Ambulatory Visit: Payer: Self-pay | Admitting: Obstetrics & Gynecology

## 2020-03-26 DIAGNOSIS — N6320 Unspecified lump in the left breast, unspecified quadrant: Secondary | ICD-10-CM

## 2020-03-26 DIAGNOSIS — R928 Other abnormal and inconclusive findings on diagnostic imaging of breast: Secondary | ICD-10-CM

## 2020-03-26 DIAGNOSIS — N6310 Unspecified lump in the right breast, unspecified quadrant: Secondary | ICD-10-CM

## 2020-03-27 ENCOUNTER — Encounter

## 2020-03-27 ENCOUNTER — Other Ambulatory Visit

## 2020-03-27 ENCOUNTER — Encounter: Payer: Self-pay | Admitting: *Deleted

## 2020-03-27 DIAGNOSIS — C50919 Malignant neoplasm of unspecified site of unspecified female breast: Secondary | ICD-10-CM | POA: Insufficient documentation

## 2020-03-28 ENCOUNTER — Other Ambulatory Visit: Payer: Self-pay | Admitting: *Deleted

## 2020-03-28 ENCOUNTER — Telehealth: Payer: Self-pay | Admitting: Hematology and Oncology

## 2020-03-28 DIAGNOSIS — C50411 Malignant neoplasm of upper-outer quadrant of right female breast: Secondary | ICD-10-CM

## 2020-03-28 DIAGNOSIS — Z171 Estrogen receptor negative status [ER-]: Secondary | ICD-10-CM

## 2020-03-28 NOTE — Telephone Encounter (Signed)
Spoke to patient to confirm morning [facility] appointment for 11/17, packet will be emailed to patient, explained surgeon's phone call as well

## 2020-04-02 NOTE — Progress Notes (Addendum)
[facility] NOTE  Patient Care Team: [name], MD as PCP - General (Internal Medicine) Rockwell Germany, RN as Oncology Nurse Navigator Mauro Kaufmann, RN as Oncology Nurse Navigator Coralie Keens, MD as Consulting Physician (General Surgery) Nicholas Lose, MD as Consulting Physician (Hematology and Oncology) [name], MD as Consulting Physician (Radiation Oncology)  CHIEF COMPLAINTS/PURPOSE OF CONSULTATION:  Newly diagnosed breast cancer  HISTORY OF PRESENTING ILLNESS:  Shannon Kane 49 y.o. female is here because of recent diagnosis of invasive ductal carcinoma of the right breast. Patient palpated a right breast mass for 2 weeks. Diagnostic mammogram and [place] on 03/14/20 showed a 2.0cm mass at the 10 o'clock position in the right breast and two enlarged right axillary lymph nodes measuring 2.0cm and 1.7cm. 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 She presents to the clinic today for initial evaluation and discussion of treatment options.   I reviewed her records extensively and collaborated the history with the patient.  SUMMARY OF ONCOLOGIC HISTORY: Oncology History  Malignant neoplasm of upper-outer quadrant of right breast in female, estrogen receptor negative ([place])  03/28/2020 Initial Diagnosis   Patient palpated a right breast mass x2 wks. Mammogram and US showed a 2.0cm mass at the 10 o'clock position in the right breast and two enlarged right axillary lymph nodes, 2.0cm and 1.7cm. 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     MEDICAL HISTORY:  Past Medical History:  Diagnosis Date  . Abnormal glucose   . Breast cancer ([place])   . Headache   . Hypertension   . Hypothyroidism   . Iron deficiency anemia   . Palpitation   . Spontaneous ecchymoses   . Tachycardia   . Vitamin D  deficiency     SURGICAL HISTORY: Past Surgical History:  Procedure Laterality Date  . BREAST BIOPSY    . BREAST EXCISIONAL BIOPSY    . CHOLECYSTECTOMY    . KNEE SURGERY    . TONSILLECTOMY      SOCIAL HISTORY: Social History   Socioeconomic History  . Marital status: Married    Spouse name: Not on file  . Number of children: Not on file  . Years of education: Not on file  . Highest education level: Not on file  Occupational History  . Not on file  Tobacco Use  . Smoking status: Never Smoker  . Smokeless tobacco: never  Vaping Use  . Vaping Use: Never used  Substance and Sexual Activity  . Alcohol use: Never    Alcohol/week: 0.0 standard drinks  . Drug use: Never  . Sexual activity: Not on file  Other Topics Concern  . Not on file  Social History Narrative  . Not on file   Social Determinants of Health   Financial Resource Strain:   . Difficulty of Paying Living Expenses: Not on file  Food Insecurity: No Food Insecurity  . Worried About Charity fundraiser in the Last Year: Never true  . Ran Out of Food in the Last Year: Never true  Transportation Needs: No Transportation Needs  . Lack of Transportation (Medical): No  . Lack of Transportation (Non-Medical): No  Physical Activity:   . Days of Exercise per Week: Not on file  . Minutes of Exercise per Session: Not on file  Stress:   .  Feeling of Stress : Not on file  Social Connections:   . Frequency of Communication with Friends and Family: Not on file  . Frequency of Social Gatherings with Friends and Family: Not on file  . Attends Religious Services: Not on file  . Active Member of Clubs or Organizations: Not on file  . Attends Archivist Meetings: Not on file  . Marital Status: Not on file  Intimate Partner Violence:   . Fear of Current or Ex-Partner: Not on file  . Emotionally Abused: Not on file  . Physically Abused: Not on file  . Sexually Abused: Not on file    FAMILY HISTORY: Family  History  Problem Relation Age of Onset  . Diabetes Mother   . Hypertension Mother   . Leukemia Father   . Ovarian cancer Maternal Grandmother   . Leukemia Paternal Grandfather     ALLERGIES:  is allergic to amoxicillin, ciprofloxacin, and penicillins.  MEDICATIONS:  Current Outpatient Medications  Medication Sig Dispense Refill  . Cholecalciferol (VITAMIN D-3 PO) Take 6,000 Units by mouth daily.     Marland Kitchen lisinopril (ZESTRIL) 20 MG tablet Take 1/2 tablet by mouth  daily 30 tablet 1  . MAGNESIUM CITRATE PO Take 500 mcg by mouth. Only when having trouble sleeping    . SYNTHROID 75 MCG tablet TAKE 1 TABLET ON MONDAY THROUGH SUNDAY 90 tablet 3   No current facility-administered medications for this visit.    REVIEW OF SYSTEMS:     All other systems were reviewed with the patient and are negative.  PHYSICAL EXAMINATION: ECOG PERFORMANCE STATUS: 1 - Symptomatic but completely ambulatory  Vitals:   04/03/20 0906  BP: (!) 142/96  Pulse: (!) 103  Resp: 18  Temp: 100 F (37.8 C)  SpO2: 96%   Filed Weights   04/03/20 0906  Weight: 178 lb (80.7 kg)       LABORATORY DATA:  I have reviewed the data as listed Lab Results  Component Value Date   WBC 6.8 04/03/2020   HGB 14.3 04/03/2020   HCT 43.3 04/03/2020   MCV 91.9 04/03/2020   PLT 279 04/03/2020   Lab Results  Component Value Date   NA 143 04/03/2020   K 3.9 04/03/2020   CL 107 04/03/2020   CO2 28 04/03/2020    RADIOGRAPHIC STUDIES: I have personally reviewed the radiological reports and agreed with the findings in the report.  ASSESSMENT AND PLAN:  Malignant neoplasm of upper-outer quadrant of right breast in female, estrogen receptor negative ([place]) 03/28/2020:Patient palpated a right breast mass x2 wks. Mammogram and US showed a 2.0cm mass at the 10 o'clock position in the right breast and two enlarged right axillary lymph nodes, 2.0cm and 1.7cm. Biopsy showed IDC in the breast and axilla, grade 2, HER-2 positive  (3+), ER+ 10% weak, PR- 0%, Ki67 30%.  Pathology and radiology counseling: Discussed with the patient, the details of pathology including the type of breast cancer,the clinical staging, the significance of ER, PR and HER-2/neu receptors and the implications for treatment. After reviewing the pathology in detail, we proceeded to discuss the different treatment options between surgery, radiation, chemotherapy, antiestrogen therapies.  Recommendation based on multidisciplinary tumor board: 1. Neoadjuvant chemotherapy with TCH Perjeta 6 cycles followed by Herceptin Perjeta versus Kadcyla maintenance for 1 year 2. Followed by breast conserving surgery with targeted node dissection 3. Followed by adjuvant radiation therapy if patient had lumpectomy  Chemotherapy Counseling: I discussed the risks and benefits of chemotherapy including  the risks of nausea/ vomiting, risk of infection from low WBC count, fatigue due to chemo or anemia, bruising or bleeding due to low platelets, mouth sores, loss/ change in taste and decreased appetite. Liver and kidney function will be monitored through out chemotherapy as abnormalities in liver and kidney function may be a side effect of treatment. Cardiac dysfunction due to Herceptin and Perjeta were discussed in detail. Risk of permanent bone marrow dysfunction due to chemo were also discussed.  Plan: 1. Port placement 2. Echocardiogram 3. Chemotherapy class 4. Breast MRI   Return to clinic in 2 weeks to start chemotherapy.     All questions were answered. The patient knows to call the clinic with any problems, questions or concerns.    Rulon Eisenmenger, MD, MPH 04/03/2020    I, Molly Dorshimer, am acting as scribe for Nicholas Lose, MD.  I have reviewed the above documentation for accuracy and completeness, and I agree with the above.

## 2020-04-03 ENCOUNTER — Encounter: Payer: Self-pay | Admitting: Hematology and Oncology

## 2020-04-03 ENCOUNTER — Other Ambulatory Visit: Payer: Self-pay | Admitting: Surgery

## 2020-04-03 ENCOUNTER — Ambulatory Visit
Admission: RE | Admit: 2020-04-03 | Discharge: 2020-04-03 | Disposition: A | Discharge status: Home / Self Care | Source: Ambulatory Visit | Attending: Radiation Oncology | Admitting: Radiation Oncology

## 2020-04-03 ENCOUNTER — Other Ambulatory Visit: Payer: Self-pay

## 2020-04-03 ENCOUNTER — Inpatient Hospital Stay: Admitting: Licensed Clinical Social Worker

## 2020-04-03 ENCOUNTER — Ambulatory Visit (HOSPITAL_BASED_OUTPATIENT_CLINIC_OR_DEPARTMENT_OTHER): Admitting: Genetic Counselor

## 2020-04-03 ENCOUNTER — Inpatient Hospital Stay

## 2020-04-03 ENCOUNTER — Ambulatory Visit: Admitting: Physical Therapy

## 2020-04-03 ENCOUNTER — Encounter: Payer: Self-pay | Admitting: Physical Therapy

## 2020-04-03 ENCOUNTER — Other Ambulatory Visit: Payer: Self-pay | Admitting: *Deleted

## 2020-04-03 ENCOUNTER — Encounter: Payer: Self-pay | Admitting: Genetic Counselor

## 2020-04-03 ENCOUNTER — Inpatient Hospital Stay: Attending: Hematology and Oncology | Admitting: Hematology and Oncology

## 2020-04-03 DIAGNOSIS — Z1379 Encounter for other screening for genetic and chromosomal anomalies: Secondary | ICD-10-CM | POA: Diagnosis not present

## 2020-04-03 DIAGNOSIS — Z5111 Encounter for antineoplastic chemotherapy: Secondary | ICD-10-CM | POA: Insufficient documentation

## 2020-04-03 DIAGNOSIS — C773 Secondary and unspecified malignant neoplasm of axilla and upper limb lymph nodes: Secondary | ICD-10-CM | POA: Insufficient documentation

## 2020-04-03 DIAGNOSIS — C50411 Malignant neoplasm of upper-outer quadrant of right female breast: Secondary | ICD-10-CM

## 2020-04-03 DIAGNOSIS — Z171 Estrogen receptor negative status [ER-]: Secondary | ICD-10-CM | POA: Insufficient documentation

## 2020-04-03 DIAGNOSIS — Z808 Family history of malignant neoplasm of other organs or systems: Secondary | ICD-10-CM

## 2020-04-03 DIAGNOSIS — Z803 Family history of malignant neoplasm of breast: Secondary | ICD-10-CM | POA: Insufficient documentation

## 2020-04-03 DIAGNOSIS — Z8041 Family history of malignant neoplasm of ovary: Secondary | ICD-10-CM | POA: Diagnosis not present

## 2020-04-03 DIAGNOSIS — Z806 Family history of leukemia: Secondary | ICD-10-CM | POA: Diagnosis not present

## 2020-04-03 HISTORY — DX: Family history of malignant neoplasm of breast: Z80.3

## 2020-04-03 HISTORY — DX: Family history of malignant neoplasm of ovary: Z80.41

## 2020-04-03 LAB — CMP (CANCER CENTER ONLY)
ALT: 14 U/L (ref 0–44)
AST: 21 U/L (ref 15–41)
Albumin: 4 g/dL (ref 3.5–5.0)
Alkaline Phosphatase: 92 U/L (ref 38–126)
Anion gap: 8 (ref 5–15)
BUN: 10 mg/dL (ref 6–20)
CO2: 28 mmol/L (ref 22–32)
Calcium: 9.3 mg/dL (ref 8.9–10.3)
Chloride: 107 mmol/L (ref 98–111)
Creatinine: 0.93 mg/dL (ref 0.44–1.00)
GFR, Estimated: >60 mL/min (ref >60)
Glucose, Bld: 101 mg/dL — ABNORMAL HIGH (ref 70–99)
Potassium: 3.9 mmol/L (ref 3.5–5.1)
Sodium: 143 mmol/L (ref 135–145)
Total Bilirubin: 0.7 mg/dL (ref 0.3–1.2)
Total Protein: 7.9 g/dL (ref 6.5–8.1)

## 2020-04-03 LAB — GENETIC SCREENING ORDER

## 2020-04-03 LAB — CBC WITH DIFFERENTIAL (CANCER CENTER ONLY)
Abs Immature Granulocytes: 0.02 10*3/uL (ref 0.00–0.07)
Basophils Absolute: 0.1 10*3/uL (ref 0.0–0.1)
Basophils Relative: 1 %
Eosinophils Absolute: 0.1 10*3/uL (ref 0.0–0.5)
Eosinophils Relative: 1 %
HCT: 43.3 % (ref 36.0–46.0)
Hemoglobin: 14.3 g/dL (ref 12.0–15.0)
Immature Granulocytes: 0 %
Lymphocytes Relative: 17 %
Lymphs Abs: 1.2 10*3/uL (ref 0.7–4.0)
MCH: 30.4 pg (ref 26.0–34.0)
MCHC: 33 g/dL (ref 30.0–36.0)
MCV: 91.9 fL (ref 80.0–100.0)
Monocytes Absolute: 0.6 10*3/uL (ref 0.1–1.0)
Monocytes Relative: 8 %
Neutro Abs: 5 10*3/uL (ref 1.7–7.7)
Neutrophils Relative %: 73 %
Platelet Count: 279 10*3/uL (ref 150–400)
RBC: 4.71 MIL/uL (ref 3.87–5.11)
RDW: 13.2 % (ref 11.5–15.5)
WBC Count: 6.8 10*3/uL (ref 4.0–10.5)
nRBC: 0 % (ref 0.0–0.2)

## 2020-04-03 NOTE — Assessment & Plan Note (Signed)
03/28/2020:Patient palpated a right breast mass x2 wks. Mammogram and US showed a 2.0cm mass at the 10 o'clock position in the right breast and two enlarged right axillary lymph nodes, 2.0cm and 1.7cm. 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  Pathology and radiology counseling: Discussed with the patient, the details of pathology including the type of breast cancer,the clinical staging, the significance of ER, PR and HER-2/neu receptors and the implications for treatment. After reviewing the pathology in detail, we proceeded to discuss the different treatment options between surgery, radiation, chemotherapy, antiestrogen therapies.  Recommendation based on multidisciplinary tumor board: 1. Neoadjuvant chemotherapy with TCH Perjeta 6 cycles followed by Herceptin Perjeta versus Kadcyla maintenance for 1 year 2. Followed by breast conserving surgery with targeted node dissection 3. Followed by adjuvant radiation therapy if patient had lumpectomy  Chemotherapy Counseling: I discussed the risks and benefits of chemotherapy including the risks of nausea/ vomiting, risk of infection from low WBC count, fatigue due to chemo or anemia, bruising or bleeding due to low platelets, mouth sores, loss/ change in taste and decreased appetite. Liver and kidney function will be monitored through out chemotherapy as abnormalities in liver and kidney function may be a side effect of treatment. Cardiac dysfunction due to Herceptin and Perjeta were discussed in detail. Risk of permanent bone marrow dysfunction due to chemo were also discussed.  Plan: 1. Port placement 2. Echocardiogram 3. Chemotherapy class 4. Breast MRI   Return to clinic in 2 weeks to start chemotherapy.

## 2020-04-03 NOTE — Therapy (Signed)
[place], [place], [address] Phone: [phone]   Fax:  [phone]  Physical Therapy Evaluation  Patient Details  Name: Shannon Kane MRN: [id number] Date of Birth: 10/03/1970 Referring Provider (PT): Dr. Coralie Keens   Encounter Date: 04/03/2020   PT End of Session - 04/03/20 1533    Visit Number 1    Number of Visits 2    Date for PT Re-Evaluation 10/01/20    PT Start Time 0945    PT Stop Time 1021    PT Time Calculation (min) 36 min    Activity Tolerance Patient tolerated treatment well    Behavior During Therapy [facility] for tasks assessed/performed           Past Medical History:  Diagnosis Date   Abnormal glucose    Breast cancer ([place])    Family history of breast cancer 04/03/2020   Family history of ovarian cancer 04/03/2020   Headache    Hypertension    Hypothyroidism    Iron deficiency anemia    Palpitation    Spontaneous ecchymoses    Tachycardia    Vitamin D deficiency     Past Surgical History:  Procedure Laterality Date   BREAST BIOPSY     BREAST EXCISIONAL BIOPSY     CHOLECYSTECTOMY     KNEE SURGERY     TONSILLECTOMY      There were no vitals filed for this visit.    Subjective Assessment - 04/03/20 1522    Subjective Patient reports she is here today at the cancer center to be seen by her medical team for her newly diagnosed left breast cancer.    Patient is accompained by: Family member    Pertinent History Patient was diagnosed on 03/14/2020 with right grade 2 invasive ductal carcinoma breast cancer. It measures 2 cm and is located in the upper outer quadrant. It is ER/PR negative and HER2 positive with a Ki67 of 30%. She has a positive axillary lymph node.    Patient Stated Goals Reduce lymphedema risk and learn post op shoulder ROM HEP    Currently in Pain? Yes    Pain Score 2     Pain Location Knee    Pain Orientation Right    Pain  Descriptors / Indicators Aching    Pain Type Chronic pain    Pain Onset More than a month ago    Pain Frequency Intermittent    Aggravating Factors  Increased activity    Pain Relieving Factors Rest    Multiple Pain Sites No              [facility] PT Assessment - 04/03/20 0001      Assessment   Medical Diagnosis Right breast cancer    Referring Provider (PT) Dr. Coralie Keens    Onset Date/Surgical Date 03/14/20    Hand Dominance Left    Prior Therapy none      Precautions   Precautions Other (comment)    Precaution Comments active cancer      Restrictions   Weight Bearing Restrictions No      Balance Screen   Has the patient fallen in the past 6 months No    Has the patient had a decrease in activity level because of a fear of falling?  No    Is the patient reluctant to leave their home because of a fear of falling?  No      Home Environment   Living Environment  Private residence    Living Arrangements Spouse/significant other;Children   Husband and 55 y.o. 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      Cognition   Overall Cognitive Status Within Functional Limits for tasks assessed      Posture/Postural Control   Posture/Postural Control No significant limitations      ROM / Strength   AROM / PROM / Strength AROM;Strength      AROM   Overall AROM Comments Cervical AROM is WNL    AROM Assessment Site Shoulder    Right/Left Shoulder Right;Left    Right Shoulder Extension 53 Degrees    Right Shoulder Flexion 170 Degrees    Right Shoulder ABduction 167 Degrees    Right Shoulder Internal Rotation 86 Degrees    Right Shoulder External Rotation 88 Degrees    Left Shoulder Extension  54 Degrees    Left Shoulder Flexion 160 Degrees    Left Shoulder ABduction 172 Degrees    Left Shoulder Internal Rotation 73 Degrees    Left Shoulder External Rotation 82 Degrees      Strength   Overall Strength Within functional limits for tasks performed             LYMPHEDEMA/ONCOLOGY QUESTIONNAIRE - 04/03/20 0001      Type   Cancer Type Right breast cancer      Lymphedema Assessments   Lymphedema Assessments Upper extremities      Right Upper Extremity Lymphedema   10 cm Proximal to Olecranon Process 28.5 cm    Olecranon Process 24.9 cm    10 cm Proximal to Ulnar Styloid Process 20.7 cm    Just Proximal to Ulnar Styloid Process 14.4 cm    Across Hand at [employer] 18.9 cm    At [place] of 2nd Digit 5.8 cm      Left Upper Extremity Lymphedema   10 cm Proximal to Olecranon Process 29.9 cm    Olecranon Process 25.7 cm    10 cm Proximal to Ulnar Styloid Process 20.7 cm    Just Proximal to Ulnar Styloid Process 15.2 cm    Across Hand at [employer] 19 cm    At [place] of 2nd Digit 5.7 cm           L-DEX FLOWSHEETS - 04/03/20 1500      L-DEX LYMPHEDEMA SCREENING   Measurement Type Unilateral    L-DEX MEASUREMENT EXTREMITY Upper Extremity    POSITION  Standing    DOMINANT SIDE Left    At Risk Side Right    BASELINE SCORE (UNILATERAL) 4.2           The patient was assessed using the L-Dex machine today to produce a lymphedema index baseline score. The patient will be reassessed on a regular basis (typically every 3 months) to obtain new L-Dex scores. If the score is > 6.5 points away from his/her baseline score indicating onset of subclinical lymphedema, it will be recommended to wear a compression garment for 4 weeks, 12 hours per day and then be reassessed. If the score continues to be > 6.5 points from baseline at reassessment, we will initiate lymphedema  treatment. Assessing in this manner has a 95% rate of preventing clinically significant  lymphedema.      Katina Dung - 04/03/20 0001    Open a tight or new jar Mild difficulty    Do heavy household chores (wash walls, wash floors) No difficulty    Carry a shopping bag or briefcase No difficulty    Wash your back No difficulty    Use a knife to cut food No difficulty    Recreational activities in which you take some force or impact through your arm, shoulder, or hand (golf, hammering, tennis) No difficulty    During the past week, to what extent has your arm, shoulder or hand problem interfered with your normal social activities with family, friends, neighbors, or groups? Not at all    During the past week, to what extent has your arm, shoulder or hand problem limited your work or other regular daily activities Not at all    Arm, shoulder, or hand pain. None    Tingling (pins and needles) in your arm, shoulder, or hand None    Difficulty Sleeping No difficulty    DASH Score 2.27 %            Objective measurements completed on examination: See above findings.       Patient was instructed today in a home exercise program today for post op shoulder range of motion. These included active assist shoulder flexion in sitting, scapular retraction, wall walking with shoulder abduction, and hands behind head external rotation.  She was encouraged to do these twice a day, holding 3 seconds and repeating 5 times when permitted by her physician.            PT Education - 04/03/20 1531    Education Details Lymphedema risk reduction and post op shoulder ROM HEP    Person(s) Educated Patient;Spouse    Methods Explanation;Demonstration;Handout    Comprehension Returned demonstration;Verbalized understanding               PT Long Term Goals - 04/03/20 1536      PT LONG TERM GOAL #1   Title Patient will demonstrate she has regained full shoulder ROM and function post operatively compared to baselines.    Time 6    Period Months    Status New    Target Date  10/01/20           Breast Clinic Goals - 04/03/20 1536      Patient will be able to verbalize understanding of pertinent lymphedema risk reduction practices relevant to her diagnosis specifically related to skin care.   Time 1    Period Days    Status Achieved      Patient will be able to return demonstrate and/or verbalize understanding of the post-op home exercise program related to regaining shoulder range of motion.   Time 1    Period Days    Status Achieved      Patient will be able to verbalize understanding of the importance of attending the postoperative After Breast Cancer Class for further lymphedema risk reduction education and therapeutic exercise.   Time 1    Period Days    Status Achieved                 Plan - 04/03/20 1534    Clinical Impression Statement Patient was diagnosed on 03/14/2020 with right grade 2 invasive ductal carcinoma breast cancer. It measures 2 cm and is located in the upper  outer quadrant. It is ER/PR negative and HER2 positive with a Ki67 of 30%. She has a positive axillary lymph node. Her multidisciplinary medical team met prior to her assessments to determine a recommended treatment plan. She is planning to have neoadjuvant chemotherapy followed by a right lumpectomy and targeted axillary lymph node dissection and radiation. She will benefit from a post op PT reassessment to determine needs and from L-Dex screens every 3 months for 2 years to detect subclinical lymphedema.    Stability/Clinical Decision Making Stable/Uncomplicated    Clinical Decision Making Low    Rehab Potential Excellent    PT Frequency --   eval and 1 f/u visit   PT Treatment/Interventions ADLs/Self Care Home Management;Therapeutic exercise;Patient/family education    PT Next Visit Plan Will reassess 3-4 weeks post op    PT Home Exercise Plan Post op shoulder ROM HEP    Consulted and Agree with Plan of Care Patient;Family member/caregiver    Family Member Consulted  Husband           Patient will benefit from skilled therapeutic intervention in order to improve the following deficits and impairments:  Postural dysfunction, Decreased range of motion, Impaired UE functional use, Pain, Decreased knowledge of precautions  Visit Diagnosis: Malignant neoplasm of upper-outer quadrant of right breast in female, estrogen receptor negative ([place]) - Plan: PT plan of care cert/re-cert   Patient will follow up at outpatient cancer rehab 3-4 weeks following surgery.  If the patient requires physical therapy at that time, a specific plan will be dictated and sent to the referring physician for approval. The patient was educated today on appropriate basic range of motion exercises to begin post operatively and the importance of attending the After Breast Cancer class following surgery.  Patient was educated today on lymphedema risk reduction practices as it pertains to recommendations that will benefit the patient immediately following surgery.  She verbalized good understanding.      Problem List Patient Active Problem List   Diagnosis Date Noted   Family history of ovarian cancer 04/03/2020   Family history of leukemia 04/03/2020   Family history of breast cancer 04/03/2020   Malignant neoplasm of upper-outer quadrant of right breast in female, estrogen receptor negative ([place]) 03/28/2020   Overweight (BMI 25.0-29.9) 12/05/2019   Essential hypertension, benign 05/29/2018   Iritis of left eye 05/29/2018   Iron deficiency anemia due to chronic blood loss 05/29/2018   Primary hypothyroidism 05/29/2018   Annia Friendly, PT 04/03/20 3:38 PM  [place], [place], [address] Phone: [phone]   Fax:  [phone]  Name: Shannon Kane MRN: [id number] Date of Birth: 1970/09/14

## 2020-04-03 NOTE — Progress Notes (Signed)
Radiation Oncology         [phone] ________________________________  Multidisciplinary Breast Oncology Clinic [facility]) Initial Outpatient Consultation  Name: Shannon Kane MRN: [id number]  Date: 04/03/2020  DOB: [birth year]  DU:KGURKYH, Bailey Mech, MD  Coralie Keens, MD   REFERRING PHYSICIAN: Coralie Keens, MD  DIAGNOSIS: The encounter diagnosis was Malignant neoplasm of upper-outer quadrant of right breast in female, estrogen receptor negative ([place]).  Stage T1c, N1, Mx, Right Breast UOQ, Invasive Ductal Carcinoma, ER- / PR- / Her2+, Grade 2    ICD-10-CM   1. Malignant neoplasm of upper-outer quadrant of right breast in female, estrogen receptor negative ([place])  C50.411    Z17.1     HISTORY OF PRESENT ILLNESS::Shannon Kane is a 49 y.o. female who is presenting to the office today for evaluation of her newly diagnosed breast cancer. She is accompanied by her husband. She is doing well overall.   She underwent bilateral diagnostic mammography with tomography and right breast ultrasonography at [facility] on 03/14/2020 for evaluation of palpable right breast mass. Results showed a 2.0 cm palpable mass in the 10 o'clock position of the right breast. It also showed an abnormal right inferior axillary/axillary tail lymph node with pronounced focal, eccentric cortical thickening that measured 1.7 x 1.5 x 0.8 cm.  Biopsy on 03/26/2020 showed grade 2 invasive ductal carcinoma of the right breast mass. Prognostic indicators were significant for: estrogen receptor, 0% negative and progesterone receptor, 0% negative. Proliferation marker Ki67 at 30%. HER2 positive. Biopsy also showed metastatic carcinoma involving the right axillary lymph node. Prognostic indicators were significant for: estrogen receptor, 10% positive with a weak staining intensity and progesterone receptor, 0% negative. Proliferation marker Ki67 at 30%. HER2 positive.  Menarche: 35-69 years old Age at first live birth:  49 years old GP: 2 LMP: 11/26/2019 Contraceptive: Yes; oral contraception from 1992-1994, 1998-2000, and 2016-2018. IUD from 2004-2009. HRT: None   The patient was referred today for presentation in the multidisciplinary conference.  Radiology studies and pathology slides were presented there for review and discussion of treatment options.  A consensus was discussed regarding potential next steps.  PREVIOUS RADIATION THERAPY: No  PAST MEDICAL HISTORY:  Past Medical History:  Diagnosis Date  . Abnormal glucose   . Breast cancer ([place])   . Family history of breast cancer 04/03/2020  . Family history of ovarian cancer 04/03/2020  . Headache   . Hypertension   . Hypothyroidism   . Iron deficiency anemia   . Palpitation   . Spontaneous ecchymoses   . Tachycardia   . Vitamin D deficiency     PAST SURGICAL HISTORY: Past Surgical History:  Procedure Laterality Date  . BREAST BIOPSY    . BREAST EXCISIONAL BIOPSY    . CHOLECYSTECTOMY    . KNEE SURGERY    . TONSILLECTOMY      FAMILY HISTORY:  Family History  Problem Relation Age of Onset  . Diabetes Mother   . Hypertension Mother   . Leukemia Father        dx > 50  . Ovarian cancer Maternal Grandmother 51  . Bone cancer Paternal Grandfather        dx unknown age  . Cancer Maternal Uncle        Mouth and throad; dx > 50  . Breast cancer Other        MGM's niece, dx unknown age  . Cancer Maternal Uncle        unknown type; dx > 50  SOCIAL HISTORY:  Social History   Socioeconomic History  . Marital status: Married    Spouse name: Not on file  . Number of children: Not on file  . Years of education: Not on file  . Highest education level: Not on file  Occupational History  . Not on file  Tobacco Use  . Smoking status: Never Smoker  . Smokeless tobacco: Former Network engineer  . Vaping Use: Never used  Substance and Sexual Activity  . Alcohol use: Never    Alcohol/week: 0.0 standard drinks  . Drug use:  Never  . Sexual activity: Not on file  Other Topics Concern  . Not on file  Social History Narrative  . Not on file   Social Determinants of Health   Financial Resource Strain:   . Difficulty of Paying Living Expenses: Not on file  Food Insecurity: No Food Insecurity  . Worried About Charity fundraiser in the Last Year: Never true  . Ran Out of Food in the Last Year: Never true  Transportation Needs: No Transportation Needs  . Lack of Transportation (Medical): No  . Lack of Transportation (Non-Medical): No  Physical Activity:   . Days of Exercise per Week: Not on file  . Minutes of Exercise per Session: Not on file  Stress:   . Feeling of Stress : Not on file  Social Connections:   . Frequency of Communication with Friends and Family: Not on file  . Frequency of Social Gatherings with Friends and Family: Not on file  . Attends Religious Services: Not on file  . Active Member of Clubs or Organizations: Not on file  . Attends Archivist Meetings: Not on file  . Marital Status: Not on file    ALLERGIES:  Allergies  Allergen Reactions  . Amoxicillin   . Ciprofloxacin   . Penicillins     MEDICATIONS:  Current Outpatient Medications  Medication Sig Dispense Refill  . Cholecalciferol (VITAMIN D-3 PO) Take 6,000 Units by mouth daily.     Marland Kitchen lisinopril (ZESTRIL) 20 MG tablet Take 1/2 tablet by mouth  daily 30 tablet 1  . MAGNESIUM CITRATE PO Take 500 mcg by mouth. Only when having trouble sleeping    . SYNTHROID 75 MCG tablet TAKE 1 TABLET ON MONDAY THROUGH SUNDAY 90 tablet 3   No current facility-administered medications for this encounter.    REVIEW OF SYSTEMS: A 10+ POINT REVIEW OF SYSTEMS WAS OBTAINED including neurology, dermatology, psychiatry, cardiac, respiratory, lymph, extremities, GI, GU, musculoskeletal, constitutional, reproductive, HEENT. On the provided form, she reports wearing glasses, change in vision, sleeping with two pillows, lump in right  breast, back pain, joint pain, thyroid problem, and hot flashes. She denies chest pain, shortness of breath, cough, abdominal pain, nausea, vomiting, diarrhea, dysuria, rash, and any other symptoms.    PHYSICAL EXAM:   Vitals with BMI 04/03/2020  Height 5' 8.2"  Weight 178 lbs  BMI 34.28  Systolic 768  Diastolic 96  Pulse 115   Lungs are clear to auscultation bilaterally. Heart has regular rate and rhythm. No palpable cervical, supraclavicular, or axillary adenopathy. Abdomen soft, non-tender, normal bowel sounds. Left breast with no palpable mass, nipple discharge, or bleeding.  Right breast with a palpable mass in the upper outer quadrant that measured approximately 1.5 to 2.0 cm. There was also a palpable lymph node in the right axilla that measured approximately 1.5 cm. No nipple discharge or bleeding.   KPS = 90  100 -  Normal; no complaints; no evidence of disease. 90   - Able to carry on normal activity; minor signs or symptoms of disease. 80   - Normal activity with effort; some signs or symptoms of disease. 64   - Cares for self; unable to carry on normal activity or to do active work. 60   - Requires occasional assistance, but is able to care for most of his personal needs. 50   - Requires considerable assistance and frequent medical care. 4   - Disabled; requires special care and assistance. 8   - Severely disabled; hospital admission is indicated although death not imminent. 57   - Very sick; hospital admission necessary; active supportive treatment necessary. 10   - Moribund; fatal processes progressing rapidly. 0     - Dead  Karnofsky DA, Abelmann [place], Craver LS and Burchenal [facility] [phone]) The use of the nitrogen mustards in the palliative treatment of carcinoma: with particular reference to bronchogenic carcinoma Cancer 1 634-56  LABORATORY DATA:  Lab Results  Component Value Date   WBC 6.8 04/03/2020   HGB 14.3 04/03/2020   HCT 43.3 04/03/2020   MCV 91.9 04/03/2020    PLT 279 04/03/2020   Lab Results  Component Value Date   NA 143 04/03/2020   K 3.9 04/03/2020   CL 107 04/03/2020   CO2 28 04/03/2020   Lab Results  Component Value Date   ALT 14 04/03/2020   AST 21 04/03/2020   ALKPHOS 92 04/03/2020   BILITOT 0.7 04/03/2020    PULMONARY FUNCTION TEST:   Recent Review Flowsheet Data   There is no flowsheet data to display.     RADIOGRAPHY: US BREAST LTD UNI RIGHT INC AXILLA  Result Date: 03/14/2020 CLINICAL DATA:  Mass felt by the patient in the upper outer quadrant of the right breast for the past 2 weeks. EXAM: DIGITAL DIAGNOSTIC RIGHT MAMMOGRAM WITH CAD AND TOMO ULTRASOUND RIGHT BREAST COMPARISON:  Previous exam(s). ACR Breast Density Category b: There are scattered areas of fibroglandular density. FINDINGS: There is an interval oval, irregular mass with some indistinct margins in the posterior aspect of the upper-outer right breast, corresponding to the palpable mass, marked with a metallic marker. There is interval enlargement of 2 adjacent right inferior axillary lymph nodes. No findings elsewhere in the right breast suspicious for malignancy. Mammographic images were processed with CAD. On physical exam, the patient has an approximately 1.5 cm rounded, palpable mass in the 10 o'clock position of the right breast, 10 cm from the nipple. Targeted ultrasound is performed, showing a 2.0 x 1.7 x 1.6 cm irregular, hypoechoic mass with ill-defined surrounding increased echogenicity in the 10 o'clock position of the right breast, 10 cm from the nipple, corresponding to the palpable mass. There is an adjacent enlarged right inferior axillary/axillary tail lymph node measuring 2.0 x 1.4 x 1 4 cm. This has loss of the normal fatty hilum and a focal protrusion extending toward the axilla. Adjacent to that lymph node, there is an additional abnormal right inferior axillary/axillary tail lymph node with an area of pronounced focal, eccentric cortical thickening  measuring 1.7 x 1.5 x 0.8 cm. No lymph nodes more superiorly in the right axilla suspicious for metastatic nodes. IMPRESSION: 1. Interval 2.0 cm palpable mass in the 10 o'clock position of the right breast imaging features highly suspicious for malignancy. 2. Interval 2 abnormal right inferior axillary/axillary tail lymph nodes with imaging features highly suspicious metastatic nodes. RECOMMENDATION: 1. Ultrasound-guided core needle biopsy of  the 2.0 cm mass in the 10 o'clock position of the right breast. 2. Ultrasound-guided core needle biopsy of the right inferior axillary/axillary tail lymph node with pronounced focal, eccentric cortical thickening measuring 1.7 x 1.5 x 0.8 cm. This is the lymph node closer to the axilla. This has been discussed with patient and the biopsies have been scheduled at 7:30 a.m. on 03/26/2020. I have discussed the findings and recommendations with the patient. If applicable, a reminder letter will be sent to the patient regarding the next appointment. BI-RADS CATEGORY  5: Highly suggestive of malignancy. Electronically Signed   By: Claudie Revering M.D.   On: 03/14/2020 16:40   MM DIAG BREAST TOMO UNI LEFT  Result Date: 03/26/2020 CLINICAL DATA:  49 year old female presenting for screening evaluation of the left breast. Patient had same day right breast biopsies and a diagnostic right breast mammogram at the end of last month. EXAM: DIGITAL DIAGNOSTIC UNILATERAL LEFT MAMMOGRAM WITH TOMO AND CAD COMPARISON:  Previous exam(s). ACR Breast Density Category b: There are scattered areas of fibroglandular density. FINDINGS: No suspicious mass, distortion, or microcalcifications are identified to suggest presence of malignancy in the left breast. Mammographic images were processed with CAD. IMPRESSION: No mammographic evidence of malignancy in the left breast. RECOMMENDATION: Screening mammogram in one year.(Code:SM-B-01Y) I have discussed the findings and recommendations with the patient. If  applicable, a reminder letter will be sent to the patient regarding the next appointment. BI-RADS CATEGORY  1: Negative. Electronically Signed   By: Audie Pinto M.D.   On: 03/26/2020 08:30   MM DIAG BREAST TOMO UNI RIGHT  Result Date: 03/14/2020 CLINICAL DATA:  Mass felt by the patient in the upper outer quadrant of the right breast for the past 2 weeks. EXAM: DIGITAL DIAGNOSTIC RIGHT MAMMOGRAM WITH CAD AND TOMO ULTRASOUND RIGHT BREAST COMPARISON:  Previous exam(s). ACR Breast Density Category b: There are scattered areas of fibroglandular density. FINDINGS: There is an interval oval, irregular mass with some indistinct margins in the posterior aspect of the upper-outer right breast, corresponding to the palpable mass, marked with a metallic marker. There is interval enlargement of 2 adjacent right inferior axillary lymph nodes. No findings elsewhere in the right breast suspicious for malignancy. Mammographic images were processed with CAD. On physical exam, the patient has an approximately 1.5 cm rounded, palpable mass in the 10 o'clock position of the right breast, 10 cm from the nipple. Targeted ultrasound is performed, showing a 2.0 x 1.7 x 1.6 cm irregular, hypoechoic mass with ill-defined surrounding increased echogenicity in the 10 o'clock position of the right breast, 10 cm from the nipple, corresponding to the palpable mass. There is an adjacent enlarged right inferior axillary/axillary tail lymph node measuring 2.0 x 1.4 x 1 4 cm. This has loss of the normal fatty hilum and a focal protrusion extending toward the axilla. Adjacent to that lymph node, there is an additional abnormal right inferior axillary/axillary tail lymph node with an area of pronounced focal, eccentric cortical thickening measuring 1.7 x 1.5 x 0.8 cm. No lymph nodes more superiorly in the right axilla suspicious for metastatic nodes. IMPRESSION: 1. Interval 2.0 cm palpable mass in the 10 o'clock position of the right breast  imaging features highly suspicious for malignancy. 2. Interval 2 abnormal right inferior axillary/axillary tail lymph nodes with imaging features highly suspicious metastatic nodes. RECOMMENDATION: 1. Ultrasound-guided core needle biopsy of the 2.0 cm mass in the 10 o'clock position of the right breast. 2. Ultrasound-guided core needle biopsy of  the right inferior axillary/axillary tail lymph node with pronounced focal, eccentric cortical thickening measuring 1.7 x 1.5 x 0.8 cm. This is the lymph node closer to the axilla. This has been discussed with patient and the biopsies have been scheduled at 7:30 a.m. on 03/26/2020. I have discussed the findings and recommendations with the patient. If applicable, a reminder letter will be sent to the patient regarding the next appointment. BI-RADS CATEGORY  5: Highly suggestive of malignancy. Electronically Signed   By: Claudie Revering M.D.   On: 03/14/2020 16:40   [place] AXILLARY NODE CORE BIOPSY RIGHT  Addendum Date: 03/27/2020   ADDENDUM REPORT: 03/27/2020 11:58 ADDENDUM: Pathology revealed GRADE II INVASIVE DUCTAL CARCINOMA of the Right breast, 10:00 o'clock. This was found to be concordant by Dr. Audie Pinto. Pathology revealed METASTATIC CARCINOMA INVOLVING A LYMPH NODE of the Right axilla. This was found to be concordant by Dr. Audie Pinto. Pathology results were discussed with the patient by telephone. The patient reported doing well after the biopsies with tenderness at the sites. Post biopsy instructions and care were reviewed and questions were answered. The patient was encouraged to call [facility] for any additional concerns. My direct phone number was provided. The patient was referred to [facility] on April 03, 2020. Pathology results reported by Terie Purser, RN on 03/27/2020. Electronically Signed   By: Audie Pinto M.D.   On: 03/27/2020  11:58   Result Date: 03/27/2020 CLINICAL DATA:  49 year old female presenting for biopsy of a right breast mass and of a right axillary lymph node. EXAM: ULTRASOUND GUIDED RIGHT BREAST CORE NEEDLE BIOPSY ULTRASOUND GUIDED RIGHT AXILLARY CORE NEEDLE BIOPSY COMPARISON:  Previous exam(s). PROCEDURE: I met with the patient and we discussed the procedure of ultrasound-guided biopsy, including benefits and alternatives. We discussed the high likelihood of a successful procedure. We discussed the risks of the procedure, including infection, bleeding, tissue injury, clip migration, and inadequate sampling. Informed written consent was given. The usual time-out protocol was performed immediately prior to the procedure. 1.  Lesion quadrant: Upper outer quadrant Using sterile technique and 1% Lidocaine as local anesthetic, under direct ultrasound visualization, a 14 gauge spring-loaded device was used to perform biopsy of a right breast mass at 10 o'clock using a lateral approach. At the conclusion of the procedure a ribbon tissue marker clip was deployed into the biopsy cavity. Follow up 2 view mammogram was performed and dictated separately. 2.  Right axilla Using sterile technique and 1% Lidocaine as local anesthetic, under direct ultrasound visualization, a 14 gauge spring-loaded device was used to perform biopsy of a right axillary lymph node using a lateral approach. At the conclusion of the procedure a coil tissue marker clip was deployed into the biopsy cavity. Follow up 2 view mammogram was performed and dictated separately. IMPRESSION: Ultrasound guided biopsy of a right breast mass at 10 o'clock and of a right axillary lymph node. No apparent complications. Electronically Signed: By: Audie Pinto M.D. On: 03/26/2020 08:32   MM CLIP PLACEMENT RIGHT  Result Date: 03/26/2020 CLINICAL DATA:  Post procedure mammogram for clip placement. EXAM: DIAGNOSTIC RIGHT MAMMOGRAM POST ULTRASOUND BIOPSY COMPARISON:   Previous exam(s). FINDINGS: Mammographic images were obtained following ultrasound guided biopsy of a right breast mass at 10 o'clock. The ribbon biopsy marking clip is in expected position at the site of biopsy. Mammographic images were obtained following ultrasound guided biopsy of a right axillary  lymph node. The coil biopsy marking clip is in expected position at the site of biopsy. IMPRESSION: 1. Appropriate positioning of the ribbon shaped biopsy marking clip at the site of biopsy in the right breast at 10 o'clock. 2. Appropriate positioning of the coil shaped biopsy marking clip at the site of biopsy in the right axilla. Final Assessment: Post Procedure Mammograms for Marker Placement Electronically Signed   By: Audie Pinto M.D.   On: 03/26/2020 08:33   [place] RT BREAST BX W LOC DEV 1ST LESION IMG BX SPEC US GUIDE  Addendum Date: 03/27/2020   ADDENDUM REPORT: 03/27/2020 11:58 ADDENDUM: Pathology revealed GRADE II INVASIVE DUCTAL CARCINOMA of the Right breast, 10:00 o'clock. This was found to be concordant by Dr. Audie Pinto. Pathology revealed METASTATIC CARCINOMA INVOLVING A LYMPH NODE of the Right axilla. This was found to be concordant by Dr. Audie Pinto. Pathology results were discussed with the patient by telephone. The patient reported doing well after the biopsies with tenderness at the sites. Post biopsy instructions and care were reviewed and questions were answered. The patient was encouraged to call [facility] for any additional concerns. My direct phone number was provided. The patient was referred to [facility] on April 03, 2020. Pathology results reported by Terie Purser, RN on 03/27/2020. Electronically Signed   By: Audie Pinto M.D.   On: 03/27/2020 11:58   Result Date: 03/27/2020 CLINICAL DATA:  49 year old female presenting for biopsy of a right breast mass  and of a right axillary lymph node. EXAM: ULTRASOUND GUIDED RIGHT BREAST CORE NEEDLE BIOPSY ULTRASOUND GUIDED RIGHT AXILLARY CORE NEEDLE BIOPSY COMPARISON:  Previous exam(s). PROCEDURE: I met with the patient and we discussed the procedure of ultrasound-guided biopsy, including benefits and alternatives. We discussed the high likelihood of a successful procedure. We discussed the risks of the procedure, including infection, bleeding, tissue injury, clip migration, and inadequate sampling. Informed written consent was given. The usual time-out protocol was performed immediately prior to the procedure. 1.  Lesion quadrant: Upper outer quadrant Using sterile technique and 1% Lidocaine as local anesthetic, under direct ultrasound visualization, a 14 gauge spring-loaded device was used to perform biopsy of a right breast mass at 10 o'clock using a lateral approach. At the conclusion of the procedure a ribbon tissue marker clip was deployed into the biopsy cavity. Follow up 2 view mammogram was performed and dictated separately. 2.  Right axilla Using sterile technique and 1% Lidocaine as local anesthetic, under direct ultrasound visualization, a 14 gauge spring-loaded device was used to perform biopsy of a right axillary lymph node using a lateral approach. At the conclusion of the procedure a coil tissue marker clip was deployed into the biopsy cavity. Follow up 2 view mammogram was performed and dictated separately. IMPRESSION: Ultrasound guided biopsy of a right breast mass at 10 o'clock and of a right axillary lymph node. No apparent complications. Electronically Signed: By: Audie Pinto M.D. On: 03/26/2020 08:32      IMPRESSION: Stage T1c, N1, Mx, Right Breast UOQ, Invasive Ductal Carcinoma, ER- / PR- / Her2+, Grade 2  The patient will be a good candidate for breast conservation with radiotherapy to the right breast. We discussed the general course of radiation, potential side effects, and toxicities with  radiation and the patient is interested in this approach.  Pt was inquiring whether there would be a benefit to mastectomy in avoiding radiation therapy,  discussed recommendng rad therapy in either situation given node positivity.   PLAN:  1. Genetics 2. MRI 3. Port placement 4. Neoadjuvant chemotherapy 5. Right lumpectomy with TAD 6. Adjuvant radiation therapy   ------------------------------------------------  Blair Promise, PhD, MD  This document serves as a record of services personally performed by [name], MD. It was created on his behalf by Clerance Lav, a trained medical scribe. The creation of this record is based on the scribe's personal observations and the provider's statements to them. This document has been checked and approved by the attending provider.

## 2020-04-03 NOTE — Patient Instructions (Signed)

## 2020-04-03 NOTE — Progress Notes (Signed)
[place] Work  Initial Assessment   Shannon Kane is a 49 y.o. year old female accompanied by patient and husband, Christy Sartorius. Clinical Social Work was referred by [facility] for assessment of psychosocial needs.   SDOH (Social Determinants of Health) assessments performed: Yes SDOH Interventions     Most Recent Value  SDOH Interventions  Food Insecurity Interventions Intervention Not Indicated  Housing Interventions Intervention Not Indicated  Transportation Interventions Intervention Not Indicated      Distress Screen completed: Yes ONCBCN DISTRESS SCREENING 04/03/2020  Screening Type Initial Screening  Distress experienced in past week (1-10) 4  Emotional problem type Nervousness/Anxiety  Physical Problem type Constipation/diarrhea;Tingling hands/feet  Physician notified of physical symptoms Yes      Family/Social Information:   Housing Arrangement: patient lives with husband, 13yo daughter Two older children live outside the home (in [place] and [place])  Family members/support persons in your life? Family and Friends/Colleagues. Strong support Tax adviser concerns: no   Employment: Working full time for [employer]. Income source: Employment. Has not told supervisor about diagnosis and does not plan to unless necessary  Financial concerns: No o Type of concern: None  Food access concerns: no  Services Currently in place:  n/a  Coping/ Adjustment to diagnosis:  Patient understands treatment plan and what happens next? yes, voiced understanding of plan discussed today  Concerns about diagnosis and/or treatment: I'm not especially worried about anything  Patient reported stressors: Adjusting to my illness  Hopes and priorities: hopes to be able to remain active throughout treatment  Patient enjoys time with family/ friends and walking, exercising, softball, quilting  Current coping skills/ strengths: Capable of independent living, Scientist, research (life sciences),  Motivation for treatment/growth, Physical Health, Special hobby/interest and Supportive family/friends    SUMMARY: Current SDOH Barriers:   None noted today  Clinical Social Work Clinical Goal(s):   Patient will continue to attend medical appts as recommended  Interventions:  Discussed common feeling and emotions when being diagnosed with cancer, and the importance of support during treatment  Informed patient of the support team roles and support services at [facility]  Provided CSW contact information and encouraged patient to call with any questions or concerns   Follow Up Plan: Patient will contact CSW with any support or resource needs Patient verbalizes understanding of plan: Yes   Christeen Douglas LCSW

## 2020-04-03 NOTE — Progress Notes (Signed)
REFERRING PROVIDER: Nicholas Lose, MD [address] [place],  [place] [phone]  PRIMARY PROVIDER:  [name], MD  PRIMARY REASON FOR VISIT:  1. Malignant neoplasm of upper-outer quadrant of right breast in female, estrogen receptor negative ([place])   2. Family history of ovarian cancer   3. Family history of leukemia   4. Family history of bone cancer   5. Family history of breast cancer    HISTORY OF PRESENT ILLNESS:   Shannon Kane, a 49 y.o. female, was seen for a [place] cancer genetics consultation at the request of Dr. Lindi Adie during the breast multi-disciplinary clinic due to a personal and family history of cancer.  Shannon Kane presents to clinic with her husband today to discuss the possibility of a hereditary predisposition to cancer, to discuss genetic testing, and to further clarify her future cancer risks, as well as potential cancer risks for family members.   In November 2021, at the age of 55, Shannon Kane was diagnosed with invasive ductal carcinoma of the right breast. The preliminary treatment plan includes neoadjuvant chemotherapy, breast conserving surgery, and adjuvant radiation.     CANCER HISTORY:  Oncology History  Malignant neoplasm of upper-outer quadrant of right breast in female, estrogen receptor negative ([place])  03/28/2020 Initial Diagnosis   Patient palpated a right breast mass x2 wks. Mammogram and US showed a 2.0cm mass at the 10 o'clock position in the right breast and two enlarged right axillary lymph nodes, 2.0cm and 1.7cm. 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     RISK FACTORS:  Menarche was at age 64 or 66.  First live birth at age 6.  OCP use for approximately 11 years.  Ovaries intact: yes.  Hysterectomy: no.  Menopausal status: most recent period November 26, 2019.  HRT use: 0 years. Colonoscopy: yes; most recent Sept 2020 per patient. Mammogram within the last  year: yes. Up to date with pelvic exams: most recent PAP 03/2019 per patient.  Past Medical History:  Diagnosis Date  . Abnormal glucose   . Breast cancer ([place])   . Family history of breast cancer 04/03/2020  . Family history of ovarian cancer 04/03/2020  . Headache   . Hypertension   . Hypothyroidism   . Iron deficiency anemia   . Palpitation   . Spontaneous ecchymoses   . Tachycardia   . Vitamin D deficiency     Past Surgical History:  Procedure Laterality Date  . BREAST BIOPSY    . BREAST EXCISIONAL BIOPSY    . CHOLECYSTECTOMY    . KNEE SURGERY    . TONSILLECTOMY      Social History   Socioeconomic History  . Marital status: Married    Spouse name: Not on file  . Number of children: Not on file  . Years of education: Not on file  . Highest education level: Not on file  Occupational History  . Not on file  Tobacco Use  . Smoking status: Never Smoker  . Smokeless tobacco: Former Network engineer  . Vaping Use: Never used  Substance and Sexual Activity  . Alcohol use: Never    Alcohol/week: 0.0 standard drinks  . Drug use: Never  . Sexual activity: Not on file  Other Topics Concern  . Not on file  Social History Narrative  . Not on file   Social Determinants of Health   Financial Resource Strain:   .  Difficulty of Paying Living Expenses: Not on file  Food Insecurity: No Food Insecurity  . Worried About Charity fundraiser in the Last Year: Never true  . Ran Out of Food in the Last Year: Never true  Transportation Needs: No Transportation Needs  . Lack of Transportation (Medical): No  . Lack of Transportation (Non-Medical): No  Physical Activity:   . Days of Exercise per Week: Not on file  . Minutes of Exercise per Session: Not on file  Stress:   . Feeling of Stress : Not on file  Social Connections:   . Frequency of Communication with Friends and Family: Not on file  . Frequency of Social Gatherings with Friends and Family: Not on file  . Attends  Religious Services: Not on file  . Active Member of Clubs or Organizations: Not on file  . Attends Archivist Meetings: Not on file  . Marital Status: Not on file     FAMILY HISTORY:  We obtained a detailed, 4-generation family history.  Significant diagnoses are listed below: Family History  Problem Relation Age of Onset  . Leukemia Father        dx > 50  . Ovarian cancer Maternal Grandmother 4  . Bone cancer Paternal Grandfather        dx unknown age  . Cancer Maternal Uncle        Mouth and throad; dx > 50  . Breast cancer Other        MGM's niece, dx unknown age  . Cancer Maternal Uncle        unknown type; dx > 50    Shannon Kane has two biological children, ages 75 and 44.  Shannon Kane has one brother, one full sister, and one maternal half sister, all without a cancer history.  Shannon Kane mother is 62 years old and has not had cancer.  Shannon Kane maternal uncles had mouth and throat cancer (diagnosed after age 50) and an unknown type of cancer (diagnosed after age 48).  Shannon Kane maternal grandmother was diagnosed with ovarian cancer at age 72 and passed away at age 20.  Shannon Kane reported a distant maternal cousin with breast cancer.  No other maternal family history of cancer was reported.  Shannon Kane father passed away at age 51 and was diagnosed with leukemia after the age of 16.  No other paternal family history of cancer was reported.   Shannon Kane is unaware of previous family history of genetic testing for hereditary cancer risks. Patient's maternal ancestors are of African American descent, and paternal ancestors are of African American descent. There is no reported Ashkenazi Jewish ancestry. There is no known consanguinity.  GENETIC COUNSELING ASSESSMENT: Shannon Kane is a 49 y.o. female with a personal and family history of cancer which is somewhat suggestive of a hereditary cancer syndrome and predisposition to cancer given related diagnoses in the family.  We, therefore, discussed and recommended the following at today's visit.   DISCUSSION: We discussed that 5 - 10% of cancer is hereditary.  Most cases of hereditary breast cancer are associated with mutations in BRCA1/2.  There are other genes that can be associated with hereditary breast and ovarian cancer syndromes.  Type of cancer risk and level of risk are gene-specific.  We discussed that testing is beneficial for several reasons including knowing how to follow individuals after completing their treatment, identifying whether potential treatment options would be beneficial, and understanding if other family members could be  at risk for cancer and allowing them to undergo genetic testing.   We reviewed the characteristics, features and inheritance patterns of hereditary cancer syndromes. We also discussed genetic testing, including the appropriate family members to test, the process of testing, insurance coverage and turn-around-time for results. We discussed the implications of a negative, positive, carrier and/or variant of uncertain significant result. We recommended Shannon Kane pursue genetic testing for a panel that includes genes associated with breast and ovarian cancer.   Shannon Kane  was offered a common hereditary cancer panel (48 genes) and an expanded pan-cancer panel (85 genes). Shannon Kane was informed of the benefits and limitations of each panel, including that expanded pan-cancer panels contain several preliminary evidence genes that do not have clear management guidelines at this point in time.  We also discussed that as the number of genes included on a panel increases, the chances of variants of uncertain significance increases.  After considering the benefits and limitations of each gene panel, Shannon Kane  elected to have an expanded pan-cancer panel through [facility].  The Multi-Cancer Panel offered by [facility] includes sequencing and/or deletion duplication testing of the following 85  genes: AIP, ALK, APC, ATM, AXIN2,BAP1,  BARD1, BLM, BMPR1A, BRCA1, BRCA2, BRIP1, CASR, CDC73, CDH1, CDK4, CDKN1B, CDKN1C, CDKN2A (p14ARF), CDKN2A (p16INK4a), CEBPA, CHEK2, CTNNA1, DICER1, DIS3L2, EGFR (c.2369C>T, p.Thr790Met variant only), EPCAM (Deletion/duplication testing only), FH, FLCN, GATA2, GPC3, GREM1 (Promoter region deletion/duplication testing only), HOXB13 (c.251G>A, p.Gly84Glu), HRAS, KIT, MAX, MEN1, MET, MITF (c.952G>A, p.Glu318Lys variant only), MLH1, MSH2, MSH3, MSH6, MUTYH, NBN, NF1, NF2, NTHL1, PALB2, PDGFRA, PHOX2B, PMS2, POLD1, POLE, POT1, PRKAR1A, PTCH1, PTEN, RAD50, RAD51C, RAD51D, RB1, RECQL4, RET, RNF43, RUNX1, SDHAF2, SDHA (sequence changes only), SDHB, SDHC, SDHD, SMAD4, SMARCA4, SMARCB1, SMARCE1, STK11, SUFU, TERC, TERT, TMEM127, TP53, TSC1, TSC2, VHL, WRN and WT1.   Based on Ms. Vanhorn's personal and family history of cancer, she meets medical criteria for genetic testing. Despite that she meets criteria, she may still have an out of pocket cost. We discussed that if her out of pocket cost for testing is over $100, the laboratory will call and confirm whether she wants to proceed with testing.  If the out of pocket cost of testing is less than $100 she will be billed by the genetic testing laboratory.   PLAN: After considering the risks, benefits, and limitations, Ms. Marinaccio provided informed consent to pursue genetic testing and the blood sample was sent to [facility] for analysis of the Multi-Cancer Panel. Results should be available within approximately 3 weeks' time, at which point they will be disclosed by telephone to Ms. Landa, as will any additional recommendations warranted by these results. Ms. Strassner will receive a summary of her genetic counseling visit and a copy of her results once available. This information will also be available in Epic.   Lastly, we encouraged Ms. Kaczmarek to remain in contact with cancer genetics annually so that we can continuously  update the family history and inform her of any changes in cancer genetics and testing that may be of benefit for this family.   Ms. Belko questions were answered to her satisfaction today. Our contact information was provided should additional questions or concerns arise. Thank you for the referral and allowing [place] to share in the care of your patient.   Abigayl Hor M. Joette Catching, [place], [place] Film/video [email or web address] (P) [phone]  The patient was seen for a total of 20 minutes in face-to-face genetic counseling.  This patient was discussed with Drs.  Magrinat, Lindi Adie and/or Burr Medico who agrees with the above.    _______________________________________________________________________ For Office Staff:  Number of people involved in session: 1 Was an Intern/ student involved with case: no

## 2020-04-04 ENCOUNTER — Encounter: Payer: Self-pay | Admitting: Hematology and Oncology

## 2020-04-04 ENCOUNTER — Telehealth: Payer: Self-pay | Admitting: Hematology and Oncology

## 2020-04-04 ENCOUNTER — Encounter: Payer: Self-pay | Admitting: *Deleted

## 2020-04-04 NOTE — Telephone Encounter (Signed)
Rescheduled appts per 11/17 los. Pt confirmed new appt dates and times.

## 2020-04-05 ENCOUNTER — Other Ambulatory Visit: Payer: Self-pay

## 2020-04-05 ENCOUNTER — Encounter (HOSPITAL_BASED_OUTPATIENT_CLINIC_OR_DEPARTMENT_OTHER): Payer: Self-pay | Admitting: Surgery

## 2020-04-05 ENCOUNTER — Encounter: Payer: Self-pay | Admitting: Hematology and Oncology

## 2020-04-05 ENCOUNTER — Telehealth: Payer: Self-pay | Admitting: *Deleted

## 2020-04-05 NOTE — Telephone Encounter (Signed)
Spoke to pt concerning [place] from 11.17.21. Denies questions or concerns regarding dx or treatment care plan. Encourage pt to call with needs. Received verbal understanding.

## 2020-04-09 ENCOUNTER — Other Ambulatory Visit: Payer: Self-pay

## 2020-04-09 ENCOUNTER — Other Ambulatory Visit: Payer: Self-pay | Admitting: *Deleted

## 2020-04-09 ENCOUNTER — Other Ambulatory Visit: Payer: Self-pay | Admitting: Hematology and Oncology

## 2020-04-09 ENCOUNTER — Inpatient Hospital Stay

## 2020-04-09 ENCOUNTER — Ambulatory Visit (HOSPITAL_COMMUNITY)
Admission: RE | Admit: 2020-04-09 | Discharge: 2020-04-09 | Disposition: A | Discharge status: Home / Self Care | Source: Ambulatory Visit | Attending: Hematology and Oncology | Admitting: Hematology and Oncology

## 2020-04-09 DIAGNOSIS — C50411 Malignant neoplasm of upper-outer quadrant of right female breast: Secondary | ICD-10-CM | POA: Diagnosis not present

## 2020-04-09 DIAGNOSIS — Z0189 Encounter for other specified special examinations: Secondary | ICD-10-CM | POA: Diagnosis not present

## 2020-04-09 DIAGNOSIS — Z171 Estrogen receptor negative status [ER-]: Secondary | ICD-10-CM | POA: Diagnosis not present

## 2020-04-09 DIAGNOSIS — I10 Essential (primary) hypertension: Secondary | ICD-10-CM | POA: Diagnosis not present

## 2020-04-09 DIAGNOSIS — Z01818 Encounter for other preprocedural examination: Secondary | ICD-10-CM | POA: Diagnosis present

## 2020-04-09 LAB — ECHOCARDIOGRAM COMPLETE
Area-P 1/2: 3.99 cm2
S' Lateral: 2.9 cm

## 2020-04-09 MED ORDER — PROCHLORPERAZINE MALEATE 10 MG PO TABS: 10.0000 mg | ORAL_TABLET | Freq: Four times a day (QID) | ORAL | 1 refills | Status: DC | PRN

## 2020-04-09 MED ORDER — LORAZEPAM 0.5 MG PO TABS: 0.5000 mg | ORAL_TABLET | Freq: Every evening | ORAL | 0 refills | Status: DC | PRN

## 2020-04-09 MED ORDER — ONDANSETRON HCL 8 MG PO TABS: 8.0000 mg | ORAL_TABLET | Freq: Two times a day (BID) | ORAL | 1 refills | Status: DC | PRN

## 2020-04-09 MED ORDER — DEXAMETHASONE 4 MG PO TABS: 4.0000 mg | ORAL_TABLET | Freq: Every day | ORAL | 0 refills | Status: DC

## 2020-04-09 MED ORDER — LIDOCAINE-PRILOCAINE 2.5-2.5 % EX CREA: TOPICAL_CREAM | CUTANEOUS | 3 refills | Status: DC

## 2020-04-09 NOTE — Progress Notes (Signed)
START ON PATHWAY REGIMEN - Breast     A cycle is every 21 days:     Pertuzumab      Pertuzumab      Trastuzumab-xxxx      Trastuzumab-xxxx      Carboplatin      Docetaxel   **Always confirm dose/schedule in your pharmacy ordering system**  Patient Characteristics: Preoperative or Nonsurgical Candidate (Clinical Staging), Neoadjuvant Therapy followed by Surgery, Invasive Disease, Chemotherapy, HER2 Positive, ER Positive Therapeutic Status: Preoperative or Nonsurgical Candidate (Clinical Staging) AJCC M Category: cM0 AJCC Grade: G2 Breast Surgical Plan: Neoadjuvant Therapy followed by Surgery ER Status: Positive (+) AJCC 8 Stage Grouping: IIA HER2 Status: Positive (+) AJCC T Category: cT1c AJCC N Category: cN1 PR Status: Negative (-) Intent of Therapy: Curative Intent, Discussed with Patient 

## 2020-04-09 NOTE — Progress Notes (Signed)
  Echocardiogram 2D Echocardiogram has been performed.  Jannett Celestine 04/09/2020, 10:12 AM

## 2020-04-12 ENCOUNTER — Other Ambulatory Visit (HOSPITAL_COMMUNITY)
Admission: RE | Admit: 2020-04-12 | Discharge: 2020-04-12 | Disposition: A | Discharge status: Home / Self Care | Source: Ambulatory Visit | Attending: Surgery | Admitting: Surgery

## 2020-04-12 DIAGNOSIS — Z01812 Encounter for preprocedural laboratory examination: Secondary | ICD-10-CM | POA: Diagnosis present

## 2020-04-12 DIAGNOSIS — Z20822 Contact with and (suspected) exposure to covid-19: Secondary | ICD-10-CM | POA: Insufficient documentation

## 2020-04-12 LAB — SARS CORONAVIRUS 2 (TAT 6-24 HRS): SARS Coronavirus 2: NEGATIVE

## 2020-04-12 NOTE — Progress Notes (Signed)
Pharmacist Chemotherapy Monitoring - Initial Assessment    Anticipated start date: 04/16/20   Regimen:  . Are orders appropriate based on the patient's diagnosis, regimen, and cycle? Yes . Does the plan date match the patient's scheduled date? Yes . Is the sequencing of drugs appropriate? Yes . Are the premedications appropriate for the patient's regimen? Yes . Prior Authorization for treatment is: Approved o If applicable, is the correct biosimilar selected based on the patient's insurance? yes  Organ Function and Labs: Marland Kitchen Are dose adjustments needed based on the patient's renal function, hepatic function, or hematologic function? No . Are appropriate labs ordered prior to the start of patient's treatment? Yes . Other organ system assessment, if indicated: women of childbearing potential: pregnancy status  . The following baseline labs, if indicated, have been ordered: N/A  Dose Assessment: . Are the drug doses appropriate? Yes . Are the following correct: o Drug concentrations Yes o IV fluid compatible with drug Yes o Administration routes Yes o Timing of therapy Yes . If applicable, does the patient have documented access for treatment and/or plans for port-a-cath placement? yes . If applicable, have lifetime cumulative doses been properly documented and assessed? yes Lifetime Dose Tracking  No doses have been documented on this patient for the following tracked chemicals: Doxorubicin, Epirubicin, Idarubicin, Daunorubicin, Mitoxantrone, Bleomycin, Oxaliplatin, Carboplatin, Liposomal Doxorubicin  o   Toxicity Monitoring/Prevention: . The patient has the following take home antiemetics prescribed: Ondansetron, Prochlorperazine, Dexamethasone and Lorazepam . The patient has the following take home medications prescribed: N/A . Medication allergies and previous infusion related reactions, if applicable, have been reviewed and addressed. Yes . The patient's current medication list has  been assessed for drug-drug interactions with their chemotherapy regimen. no significant drug-drug interactions were identified on review.  Order Review: . Are the treatment plan orders signed? Yes . Is the patient scheduled to see a provider prior to their treatment? No  I verify that I have reviewed each item in the above checklist and answered each question accordingly.   Kennith Center, Pharm.D., CPP 04/12/2020@1 :08 PM

## 2020-04-14 NOTE — H&P (Signed)
Shannon Kane Appointment: 04/03/2020 9:00 AM Location: [facility] Patient #: [id number] DOB: [birth year] Married / Language: English / Race: Black or African American Female   History of Present Illness (Thales Knipple A. [name] MD; 04/03/2020 10:52 AM) The patient is a 49 year old female who presents with breast cancer. Chief complaint: Right breast cancer  This is a 49 year old female who recently palpated a mass in the upper outer quadrant of the right breast after exercising. She has no previous history of breast masses. She denies nipple discharge. She underwent a mammogram and ultrasound showing a 2 cm mass at the 10 o'clock position near the axilla. She underwent biopsy of the mass and a lymph node. Both were positive for invasive ductal carcinoma. It was ER and PR negative, HER-2 positive, and the Ki-67 was 30%. She is otherwise healthy without complaints.    Past Surgical History Conni Slipper, RN; 04/03/2020 8:24 AM) Breast Biopsy  Bilateral. Gallbladder Surgery - Laparoscopic  Knee Surgery  Right. Tonsillectomy   Diagnostic Studies History Conni Slipper, RN; 04/03/2020 8:24 AM) Colonoscopy  within last year Mammogram  within last year Pap Smear  1-5 years ago  Medication History Conni Slipper, RN; 04/03/2020 8:23 AM) Medications Reconciled  Social History Conni Slipper, RN; 04/03/2020 8:24 AM) Alcohol use  Occasional alcohol use. Caffeine use  Tea. No drug use  Tobacco use  Never smoker.  Family History Conni Slipper, RN; 04/03/2020 8:24 AM) Anesthetic complications  Family Members In General. Arthritis  Family Members In General. Bleeding disorder  Family Members In General. Cancer  Father. Cerebrovascular Accident  Family Members In General. Cervical Cancer  Family Members In General. Diabetes Mellitus  Mother. Heart Disease  Mother. Hypertension  Family Members In General, Mother. Ovarian Cancer  Family Members In  General.  Pregnancy / Birth History Conni Slipper, RN; 04/03/2020 8:24 AM) Age at menarche  32 years. Contraceptive History  Contraceptive implant, Oral contraceptives. Gravida  2 Length (months) of breastfeeding  12-24 Maternal age  79-25 Para  2 Regular periods   Other Problems Conni Slipper, RN; 04/03/2020 8:24 AM) Back Pain  Cholelithiasis  Hemorrhoids  Hepatitis  High blood pressure  Lump In Breast  Thyroid Disease  Umbilical Hernia Repair     Review of Systems Conni Slipper RN; 04/03/2020 8:24 AM) General Present- Night Sweats. Not Present- Appetite Loss, Chills, Fatigue, Fever, Weight Gain and Weight Loss. HEENT Present- Wears glasses/contact lenses. Not Present- Earache, Hearing Loss, Hoarseness, Nose Bleed, Oral Ulcers, Ringing in the Ears, Seasonal Allergies, Sinus Pain, Sore Throat, Visual Disturbances and Yellow Eyes. Respiratory Not Present- Bloody sputum, Chronic Cough, Difficulty Breathing, Snoring and Wheezing. Breast Present- Breast Mass. Not Present- Breast Pain, Nipple Discharge and Skin Changes. Cardiovascular Present- Leg Cramps. Not Present- Chest Pain, Difficulty Breathing Lying Down, Palpitations, Rapid Heart Rate, Shortness of Breath and Swelling of Extremities. Female Genitourinary Not Present- Frequency, Nocturia, Painful Urination, Pelvic Pain and Urgency. Musculoskeletal Present- Back Pain, Joint Pain, Joint Stiffness and Muscle Weakness. Not Present- Muscle Pain and Swelling of Extremities. Neurological Present- Decreased Memory, Numbness and Tingling. Not Present- Fainting, Headaches, Seizures, Tremor, Trouble walking and Weakness. Psychiatric Not Present- Anxiety, Bipolar, Change in Sleep Pattern, Depression, Fearful and Frequent crying. Endocrine Present- Hair Changes and Hot flashes. Not Present- Cold Intolerance, Excessive Hunger, Heat Intolerance and New Diabetes. Hematology Not Present- Blood Thinners, Easy Bruising, Excessive  bleeding, Gland problems, HIV and Persistent Infections.   Physical Exam (Trevaun Rendleman A. [name] MD; 04/03/2020 10:53 AM) The physical exam  findings are as follows: Note: She appears well on exam  She has a 2 cm mass near the axilla of the right breast which is mobile. I cannot feel the adenopathy. She has a well-healed scar around her right lateral areola from a benign biopsy years ago. There is no nipple discharge.    Assessment & Plan (Khasir Woodrome A. [name] MD; 04/03/2020 10:56 AM)  INVASIVE DUCTAL CARCINOMA OF RIGHT BREAST (C50.911)  Impression: I have reviewed the patient's notes in the electronic medical records. I have reviewed her mammograms, ultrasound, pathology results. We also discussed her this morning in our multidisciplinary breast cancer conference. I had a long discussion with the patient and her husband. Given the size of the mass, positive lymph nodes, and HER-2 positive status, neoadjuvant treatment is recommended in order to shrink the mass as well as proceed with breast conservation. We discussed the reasons for this with her in detail. She agrees with this plan. We then discussed that she would need a Port-A-Cath inserted to start chemotherapy. I discussed this with her and her husband in detail. I discussed the surgical procedure as well as the risks. These risks include but are not limited to bleeding, infection, injury to surrounding structures, pneumothorax, the need for further procedures, cardiopulmonary issues, postoperative recovery, etc. She understands and wished to proceed with conservative management and neoadjuvant chemotherapy. Port-A-Cath will be scheduled as soon as possible

## 2020-04-15 ENCOUNTER — Ambulatory Visit (HOSPITAL_COMMUNITY)

## 2020-04-15 ENCOUNTER — Ambulatory Visit (HOSPITAL_BASED_OUTPATIENT_CLINIC_OR_DEPARTMENT_OTHER): Admitting: Anesthesiology

## 2020-04-15 ENCOUNTER — Ambulatory Visit (HOSPITAL_BASED_OUTPATIENT_CLINIC_OR_DEPARTMENT_OTHER)
Admission: RE | Admit: 2020-04-15 | Discharge: 2020-04-15 | Disposition: A | Discharge status: Home / Self Care | Attending: Surgery | Admitting: Surgery

## 2020-04-15 ENCOUNTER — Other Ambulatory Visit: Payer: Self-pay

## 2020-04-15 ENCOUNTER — Encounter (HOSPITAL_BASED_OUTPATIENT_CLINIC_OR_DEPARTMENT_OTHER)
Admission: RE | Disposition: A | Discharge status: Home / Self Care | Payer: Self-pay | Source: Home / Self Care | Attending: Surgery

## 2020-04-15 ENCOUNTER — Ambulatory Visit (HOSPITAL_COMMUNITY)
Admission: RE | Admit: 2020-04-15 | Discharge: 2020-04-15 | Disposition: A | Discharge status: Home / Self Care | Source: Ambulatory Visit | Attending: Hematology and Oncology | Admitting: Hematology and Oncology

## 2020-04-15 ENCOUNTER — Encounter: Payer: Self-pay | Admitting: *Deleted

## 2020-04-15 ENCOUNTER — Encounter (HOSPITAL_BASED_OUTPATIENT_CLINIC_OR_DEPARTMENT_OTHER): Payer: Self-pay | Admitting: Surgery

## 2020-04-15 DIAGNOSIS — C773 Secondary and unspecified malignant neoplasm of axilla and upper limb lymph nodes: Secondary | ICD-10-CM | POA: Insufficient documentation

## 2020-04-15 DIAGNOSIS — C50411 Malignant neoplasm of upper-outer quadrant of right female breast: Secondary | ICD-10-CM | POA: Insufficient documentation

## 2020-04-15 DIAGNOSIS — Z171 Estrogen receptor negative status [ER-]: Secondary | ICD-10-CM | POA: Insufficient documentation

## 2020-04-15 DIAGNOSIS — Z452 Encounter for adjustment and management of vascular access device: Secondary | ICD-10-CM

## 2020-04-15 DIAGNOSIS — Z95828 Presence of other vascular implants and grafts: Secondary | ICD-10-CM

## 2020-04-15 DIAGNOSIS — C50911 Malignant neoplasm of unspecified site of right female breast: Secondary | ICD-10-CM | POA: Diagnosis present

## 2020-04-15 HISTORY — PX: PORTACATH PLACEMENT: SHX2246

## 2020-04-15 LAB — COMPREHENSIVE METABOLIC PANEL
ALT: 15 U/L (ref 0–44)
AST: 21 U/L (ref 15–41)
Albumin: 3.2 g/dL — ABNORMAL LOW (ref 3.5–5.0)
Alkaline Phosphatase: 67 U/L (ref 38–126)
Anion gap: 9 (ref 5–15)
BUN: 8 mg/dL (ref 6–20)
CO2: 25 mmol/L (ref 22–32)
Calcium: 8.6 mg/dL — ABNORMAL LOW (ref 8.9–10.3)
Chloride: 105 mmol/L (ref 98–111)
Creatinine, Ser: 0.7 mg/dL (ref 0.44–1.00)
GFR, Estimated: >60 mL/min (ref >60)
Glucose, Bld: 106 mg/dL — ABNORMAL HIGH (ref 70–99)
Potassium: 3.8 mmol/L (ref 3.5–5.1)
Sodium: 139 mmol/L (ref 135–145)
Total Bilirubin: 0.5 mg/dL (ref 0.3–1.2)
Total Protein: 6.4 g/dL — ABNORMAL LOW (ref 6.5–8.1)

## 2020-04-15 LAB — CBC
HCT: 37.4 % (ref 36.0–46.0)
Hemoglobin: 12.4 g/dL (ref 12.0–15.0)
MCH: 30.6 pg (ref 26.0–34.0)
MCHC: 33.2 g/dL (ref 30.0–36.0)
MCV: 92.3 fL (ref 80.0–100.0)
Platelets: 255 10*3/uL (ref 150–400)
RBC: 4.05 MIL/uL (ref 3.87–5.11)
RDW: 13.5 % (ref 11.5–15.5)
WBC: 5.2 10*3/uL (ref 4.0–10.5)
nRBC: 0 % (ref 0.0–0.2)

## 2020-04-15 SURGERY — INSERTION, TUNNELED CENTRAL VENOUS DEVICE, WITH PORT
Anesthesia: General | Site: Chest | Laterality: Left

## 2020-04-15 MED ORDER — CELECOXIB 200 MG PO CAPS
ORAL_CAPSULE | ORAL | Status: AC
  Filled 2020-04-15: qty 2

## 2020-04-15 MED ORDER — ONDANSETRON HCL 4 MG/2ML IJ SOLN
INTRAMUSCULAR | Status: DC | PRN
  Administered 2020-04-15: 4 mg via INTRAVENOUS

## 2020-04-15 MED ORDER — GABAPENTIN 300 MG PO CAPS
ORAL_CAPSULE | ORAL | Status: AC
  Filled 2020-04-15: qty 1

## 2020-04-15 MED ORDER — HEPARIN SOD (PORK) LOCK FLUSH 100 UNIT/ML IV SOLN
INTRAVENOUS | Status: AC
  Filled 2020-04-15: qty 5

## 2020-04-15 MED ORDER — ENSURE PRE-SURGERY PO LIQD: 296.0000 mL | Freq: Once | ORAL | Status: DC

## 2020-04-15 MED ORDER — MIDAZOLAM HCL 2 MG/2ML IJ SOLN
INTRAMUSCULAR | Status: AC
  Filled 2020-04-15: qty 2

## 2020-04-15 MED ORDER — PROPOFOL 10 MG/ML IV BOLUS
INTRAVENOUS | Status: AC
  Filled 2020-04-15: qty 20

## 2020-04-15 MED ORDER — PROMETHAZINE HCL 25 MG/ML IJ SOLN: 6.2500 mg | INTRAMUSCULAR | Status: DC | PRN

## 2020-04-15 MED ORDER — CELECOXIB 200 MG PO CAPS
400.0000 mg | ORAL_CAPSULE | ORAL | Status: AC
  Administered 2020-04-15: 400 mg via ORAL

## 2020-04-15 MED ORDER — HEPARIN SOD (PORK) LOCK FLUSH 100 UNIT/ML IV SOLN
INTRAVENOUS | Status: DC | PRN
  Administered 2020-04-15: 500 [IU] via INTRAVENOUS

## 2020-04-15 MED ORDER — CEFAZOLIN SODIUM-DEXTROSE 2-4 GM/100ML-% IV SOLN
INTRAVENOUS | Status: AC
  Filled 2020-04-15: qty 100

## 2020-04-15 MED ORDER — DEXAMETHASONE SODIUM PHOSPHATE 10 MG/ML IJ SOLN
INTRAMUSCULAR | Status: DC | PRN
  Administered 2020-04-15: 4 mg via INTRAVENOUS

## 2020-04-15 MED ORDER — ACETAMINOPHEN 500 MG PO TABS
ORAL_TABLET | ORAL | Status: AC
  Filled 2020-04-15: qty 2

## 2020-04-15 MED ORDER — HEPARIN (PORCINE) IN NACL 1000-0.9 UT/500ML-% IV SOLN
INTRAVENOUS | Status: AC
  Filled 2020-04-15: qty 500

## 2020-04-15 MED ORDER — PROPOFOL 10 MG/ML IV BOLUS
INTRAVENOUS | Status: DC | PRN
  Administered 2020-04-15: 200 mg via INTRAVENOUS
  Administered 2020-04-15: 100 mg via INTRAVENOUS

## 2020-04-15 MED ORDER — ONDANSETRON HCL 4 MG/2ML IJ SOLN
INTRAMUSCULAR | Status: AC
  Filled 2020-04-15: qty 2

## 2020-04-15 MED ORDER — OXYCODONE HCL 5 MG PO TABS
5.0000 mg | ORAL_TABLET | Freq: Four times a day (QID) | ORAL | 0 refills | Status: DC | PRN
Start: 2020-04-15 — End: 2020-04-19

## 2020-04-15 MED ORDER — LACTATED RINGERS IV SOLN: INTRAVENOUS | Status: DC

## 2020-04-15 MED ORDER — LIDOCAINE 2% (20 MG/ML) 5 ML SYRINGE
INTRAMUSCULAR | Status: AC
  Filled 2020-04-15: qty 5

## 2020-04-15 MED ORDER — MEPERIDINE HCL 25 MG/ML IJ SOLN: 6.2500 mg | INTRAMUSCULAR | Status: DC | PRN

## 2020-04-15 MED ORDER — BUPIVACAINE-EPINEPHRINE 0.5% -1:200000 IJ SOLN
INTRAMUSCULAR | Status: DC | PRN
  Administered 2020-04-15: 6 mL

## 2020-04-15 MED ORDER — CHLORHEXIDINE GLUCONATE CLOTH 2 % EX PADS: 6.0000 | MEDICATED_PAD | Freq: Once | CUTANEOUS | Status: DC

## 2020-04-15 MED ORDER — LIDOCAINE 2% (20 MG/ML) 5 ML SYRINGE
INTRAMUSCULAR | Status: DC | PRN
  Administered 2020-04-15: 80 mg via INTRAVENOUS

## 2020-04-15 MED ORDER — ACETAMINOPHEN 500 MG PO TABS
1000.0000 mg | ORAL_TABLET | ORAL | Status: AC
  Administered 2020-04-15: 1000 mg via ORAL

## 2020-04-15 MED ORDER — HEPARIN (PORCINE) IN NACL 2-0.9 UNITS/ML
INTRAMUSCULAR | Status: AC | PRN
  Administered 2020-04-15: 1 via INTRAVENOUS

## 2020-04-15 MED ORDER — GABAPENTIN 300 MG PO CAPS
300.0000 mg | ORAL_CAPSULE | ORAL | Status: AC
  Administered 2020-04-15: 300 mg via ORAL

## 2020-04-15 MED ORDER — GADOBUTROL 1 MMOL/ML IV SOLN
8.0000 mL | Freq: Once | INTRAVENOUS | Status: AC | PRN
  Administered 2020-04-15: 8 mL via INTRAVENOUS

## 2020-04-15 MED ORDER — OXYCODONE HCL 5 MG/5ML PO SOLN: 5.0000 mg | Freq: Once | ORAL | Status: DC | PRN

## 2020-04-15 MED ORDER — OXYCODONE HCL 5 MG PO TABS: 5.0000 mg | ORAL_TABLET | Freq: Once | ORAL | Status: DC | PRN

## 2020-04-15 MED ORDER — HYDROMORPHONE HCL 1 MG/ML IJ SOLN: 0.2500 mg | INTRAMUSCULAR | Status: DC | PRN

## 2020-04-15 MED ORDER — AMISULPRIDE (ANTIEMETIC) 5 MG/2ML IV SOLN: 10.0000 mg | Freq: Once | INTRAVENOUS | Status: DC | PRN

## 2020-04-15 MED ORDER — CEFAZOLIN SODIUM-DEXTROSE 2-4 GM/100ML-% IV SOLN
2.0000 g | INTRAVENOUS | Status: AC
  Administered 2020-04-15: 2 g via INTRAVENOUS

## 2020-04-15 MED ORDER — PHENYLEPHRINE 40 MCG/ML (10ML) SYRINGE FOR IV PUSH (FOR BLOOD PRESSURE SUPPORT)
PREFILLED_SYRINGE | INTRAVENOUS | Status: DC | PRN
  Administered 2020-04-15: 120 ug via INTRAVENOUS
  Administered 2020-04-15 (×2): 80 ug via INTRAVENOUS

## 2020-04-15 MED ORDER — MIDAZOLAM HCL 5 MG/5ML IJ SOLN
INTRAMUSCULAR | Status: DC | PRN
  Administered 2020-04-15: 2 mg via INTRAVENOUS

## 2020-04-15 SURGICAL SUPPLY — 41 items
ADH SKN CLS APL DERMABOND .7 (GAUZE/BANDAGES/DRESSINGS) ×2
APL PRP STRL LF DISP 70% ISPRP (MISCELLANEOUS) ×1
BAG DECANTER FOR FLEXI CONT (MISCELLANEOUS) ×2 IMPLANT
BLADE SURG 15 STRL LF DISP TIS (BLADE) ×1 IMPLANT
BLADE SURG 15 STRL SS (BLADE) ×2
CANISTER SUCT 1200ML W/VALVE (MISCELLANEOUS) IMPLANT
CHLORAPREP W/TINT 26 (MISCELLANEOUS) ×2 IMPLANT
COVER BACK TABLE 60X90IN (DRAPES) ×2 IMPLANT
COVER MAYO STAND STRL (DRAPES) ×2 IMPLANT
DERMABOND ADVANCED (GAUZE/BANDAGES/DRESSINGS) ×2
DERMABOND ADVANCED .7 DNX12 (GAUZE/BANDAGES/DRESSINGS) ×2 IMPLANT
DRAPE C-ARM 42X72 X-RAY (DRAPES) ×2 IMPLANT
DRAPE LAPAROSCOPIC ABDOMINAL (DRAPES) ×2 IMPLANT
DRAPE UTILITY XL STRL (DRAPES) ×2 IMPLANT
DRSG TEGADERM 4X4.75 (GAUZE/BANDAGES/DRESSINGS) ×1 IMPLANT
ELECT REM PT RETURN 9FT ADLT (ELECTROSURGICAL) ×2
ELECTRODE REM PT RTRN 9FT ADLT (ELECTROSURGICAL) ×1 IMPLANT
GAUZE SPONGE 4X4 12PLY STRL LF (GAUZE/BANDAGES/DRESSINGS) ×1 IMPLANT
GLOVE BIO SURGEON STRL SZ 6.5 (GLOVE) ×1 IMPLANT
GLOVE BIO SURGEON STRL SZ7 (GLOVE) ×1 IMPLANT
GLOVE SURG SIGNA 7.5 PF LTX (GLOVE) ×2 IMPLANT
GOWN STRL REUS W/ TWL LRG LVL3 (GOWN DISPOSABLE) ×1 IMPLANT
GOWN STRL REUS W/ TWL XL LVL3 (GOWN DISPOSABLE) ×1 IMPLANT
GOWN STRL REUS W/TWL LRG LVL3 (GOWN DISPOSABLE) ×4
GOWN STRL REUS W/TWL XL LVL3 (GOWN DISPOSABLE) ×2
KIT PORT POWER 8FR ISP CVUE (Port) ×1 IMPLANT
NDL HYPO 25X1 1.5 SAFETY (NEEDLE) ×1 IMPLANT
NEEDLE HYPO 25X1 1.5 SAFETY (NEEDLE) ×2 IMPLANT
PACK BASIN DAY SURGERY FS (CUSTOM PROCEDURE TRAY) ×2 IMPLANT
PENCIL SMOKE EVACUATOR (MISCELLANEOUS) ×2 IMPLANT
SLEEVE SCD COMPRESS KNEE MED (MISCELLANEOUS) ×2 IMPLANT
SUT MNCRL AB 4-0 PS2 18 (SUTURE) ×2 IMPLANT
SUT PROLENE 2 0 SH DA (SUTURE) ×2 IMPLANT
SUT SILK 2 0 TIES 17X18 (SUTURE)
SUT SILK 2-0 18XBRD TIE BLK (SUTURE) IMPLANT
SUT VIC AB 3-0 SH 27 (SUTURE) ×2
SUT VIC AB 3-0 SH 27X BRD (SUTURE) ×1 IMPLANT
SYR CONTROL 10ML LL (SYRINGE) ×2 IMPLANT
TOWEL GREEN STERILE FF (TOWEL DISPOSABLE) ×2 IMPLANT
TUBE CONNECTING 20X1/4 (TUBING) IMPLANT
YANKAUER SUCT BULB TIP NO VENT (SUCTIONS) IMPLANT

## 2020-04-15 NOTE — Anesthesia Postprocedure Evaluation (Signed)
Anesthesia Post Note  Patient: Shannon Kane  Procedure(s) Performed: INSERTION PORT-A-CATH (Left Chest)     Patient location during evaluation: PACU Anesthesia Type: General Level of consciousness: awake and alert Pain management: pain level controlled Vital Signs Assessment: post-procedure vital signs reviewed and stable Respiratory status: spontaneous breathing, nonlabored ventilation and respiratory function stable Cardiovascular status: blood pressure returned to baseline and stable Postop Assessment: no apparent nausea or vomiting Anesthetic complications: no   No complications documented.  Last Vitals:  Vitals:   04/15/20 1430 04/15/20 1456  BP: (!) 160/102 (!) 163/94  Pulse: 71 61  Resp: 11 18  Temp:  (!) 36.4 C  SpO2: 100% 100%    Last Pain:  Vitals:   04/15/20 1456  TempSrc:   PainSc: 0-No pain                 Lynda Rainwater

## 2020-04-15 NOTE — Op Note (Signed)
INSERTION PORT-A-CATH  Procedure Note  Shannon Kane 04/15/2020   Pre-op Diagnosis: RIGHT BREAST CANCER     Post-op Diagnosis: same  Procedure(s): INSERTION PORT-A-CATH LEFT SUBCLAVIAN VEIN (8 fr)  Surgeon(s): Coralie Keens, MD  Anesthesia: General  Staff:  Circulator: Maurene Capes, RN; Izora Ribas, RN Scrub Person: Lorenza Burton, CST  Estimated Blood Loss: Minimal               Indications: This is a 49 year old female recently diagnosed with metastatic breast cancer to the lymph nodes.  The decision was made to start neoadjuvant therapy.  A Port-A-Cath will be placed for this purpose and will be left accessed as she is to start chemotherapy the following day  Procedure: The patient was brought to the operating room and identifies correct patient.  She is placed upon the operating table and anesthesia was induced.  Her left chest and neck were prepped and draped in the usual sterile fashion.  The patient was placed in the Trendelenburg position.  I anesthetized the skin of the clavicle with Marcaine.  I then used the introducer needle to cannulate the left subclavian vein.  A guidewire was then passed through the needle and into the central venous system under fluoroscopy.  Next anesthetized the incision further with Marcaine and then made a longitudinal incision at the wire introduction site with a scalpel.  I then dissected down to the subcutaneous tissue with the cautery.  I then created a pocket for the port with the cautery.  An 8 French [place] port and catheter were brought onto the field.  The port fit well into the pocket.  I passed the venous dilator and introducer sheath over the wire and into the central venous system.  The wire and the dilator were then removed.  I attached the catheter to the port.  It was flushed.  I then cut the catheter appropriate length.  I then passed the catheter down the introducer sheath and the central venous system under direct  fluoroscopy.  Fluoroscopy confirmed placement in the vena cava.  I accessed the port and good flush and return were demonstrated.  We then withdrew 6 to 7 cc of blood from the port for the cancer center to check a CBC and a CMP.  The port was then sewn to the chest wall with 2 separate 3-0 Prolene sutures.  I then instilled concentrated heparin solution into the port.  I then closed subtenons tissue with interrupted 3-0 Vicryl sutures and closed the skin with running 4-0 Monocryl.  Dermabond was then applied.  I next again accessed the port through the skin and flushed it.  Again good flush and return were demonstrated.  The needle and catheter were left in place and covered with a gauze bandage.  The patient tolerated the procedure well.  All the counts were correct at the end of the procedure.  The patient was then extubated in the operating room and taken in stable addition to the recovery room.          Coralie Keens   Date: 04/15/2020  Time: 2:04 PM

## 2020-04-15 NOTE — Discharge Instructions (Signed)
Ok to shower starting tomorrow after chemo  Ice pack, tylenol, and ibuprofen as needed  No vigorous activity for one    Post Anesthesia Home Care Instructions  Activity: Get plenty of rest for the remainder of the day. A responsible individual must stay with you for 24 hours following the procedure.  For the next 24 hours, DO NOT: -Drive a car -Paediatric nurse -Drink alcoholic beverages -Take any medication unless instructed by your physician -Make any legal decisions or sign important papers.  Meals: Start with liquid foods such as gelatin or soup. Progress to regular foods as tolerated. Avoid greasy, spicy, heavy foods. If nausea and/or vomiting occur, drink only clear liquids until the nausea and/or vomiting subsides. Call your physician if vomiting continues.  Special Instructions/Symptoms: Your throat may feel dry or sore from the anesthesia or the breathing tube placed in your throat during surgery. If this causes discomfort, gargle with warm salt water. The discomfort should disappear within 24 hours.  If you had a scopolamine patch placed behind your ear for the management of post- operative nausea and/or vomiting:  1. The medication in the patch is effective for 72 hours, after which it should be removed.  Wrap patch in a tissue and discard in the trash. Wash hands thoroughly with soap and water. 2. You may remove the patch earlier than 72 hours if you experience unpleasant side effects which may include dry mouth, dizziness or visual disturbances. 3. Avoid touching the patch. Wash your hands with soap and water after contact with the patch.

## 2020-04-15 NOTE — Anesthesia Procedure Notes (Signed)
Procedure Name: LMA Insertion Performed by: Milford Cage, CRNA Pre-anesthesia Checklist: Patient identified, Emergency Drugs available, Suction available and Patient being monitored Patient Re-evaluated:Patient Re-evaluated prior to induction Oxygen Delivery Method: Circle System Utilized Preoxygenation: Pre-oxygenation with 100% oxygen Induction Type: IV induction Ventilation: Mask ventilation without difficulty LMA: LMA inserted LMA Size: 4.0 Number of attempts: 2 Airway Equipment and Method: Bite block Placement Confirmation: positive ETCO2 Tube secured with: Tape Dental Injury: Teeth and Oropharynx as per pre-operative assessment

## 2020-04-15 NOTE — Interval H&P Note (Signed)
History and Physical Interval Note:no change in H and P  04/15/2020 11:21 AM  Shannon Kane  has presented today for surgery, with the diagnosis of RIGHT BREAST CANCER.  The various methods of treatment have been discussed with the patient and family. After consideration of risks, benefits and other options for treatment, the patient has consented to  Procedure(s): INSERTION PORT-A-CATH  WITH ULTRASOUND GUIDANCE (N/A) as a surgical intervention.  The patient's history has been reviewed, patient examined, no change in status, stable for surgery.  I have reviewed the patient's chart and labs.  Questions were answered to the patient's satisfaction.     Coralie Keens

## 2020-04-15 NOTE — Anesthesia Preprocedure Evaluation (Signed)
Anesthesia Evaluation  Patient identified by MRN, date of birth, ID band Patient awake    Reviewed: Allergy & Precautions, NPO status , Patient's Chart, lab work & pertinent test results  Airway Mallampati: II  TM Distance: >3 FB Neck ROM: Full    Dental no notable dental hx.    Pulmonary neg pulmonary ROS,    Pulmonary exam normal breath sounds clear to auscultation       Cardiovascular hypertension, Pt. on medications negative cardio ROS Normal cardiovascular exam Rhythm:Regular Rate:Normal     Neuro/Psych negative neurological ROS  negative psych ROS   GI/Hepatic negative GI ROS, Neg liver ROS,   Endo/Other  Hypothyroidism   Renal/GU negative Renal ROS  negative genitourinary   Musculoskeletal negative musculoskeletal ROS (+)   Abdominal   Peds negative pediatric ROS (+)  Hematology negative hematology ROS (+)   Anesthesia Other Findings Breast Cancer  Reproductive/Obstetrics negative OB ROS                             Anesthesia Physical Anesthesia Plan  ASA: III  Anesthesia Plan: General   Post-op Pain Management:    Induction: Intravenous  PONV Risk Score and Plan: 3 and Ondansetron, Dexamethasone, Midazolam and Treatment may vary due to age or medical condition  Airway Management Planned: LMA  Additional Equipment:   Intra-op Plan:   Post-operative Plan: Extubation in OR  Informed Consent: I have reviewed the patients History and Physical, chart, labs and discussed the procedure including the risks, benefits and alternatives for the proposed anesthesia with the patient or authorized representative who has indicated his/her understanding and acceptance.     Dental advisory given  Plan Discussed with: CRNA  Anesthesia Plan Comments:         Anesthesia Quick Evaluation

## 2020-04-15 NOTE — Transfer of Care (Signed)
Immediate Anesthesia Transfer of Care Note  Patient: Shannon Kane  Procedure(s) Performed: INSERTION PORT-A-CATH (Left Chest)  Patient Location: PACU  Anesthesia Type:General  Level of Consciousness: awake  Airway & Oxygen Therapy: Patient Spontanous Breathing  Post-op Assessment: Report given to RN and Post -op Vital signs reviewed and stable  Post vital signs: Reviewed and stable  Last Vitals:  Vitals Value Taken Time  BP 147/86 04/15/20 1404  Temp    Pulse 83 04/15/20 1407  Resp 16 04/15/20 1407  SpO2 99 % 04/15/20 1407  Vitals shown include unvalidated device data.  Last Pain:  Vitals:   04/15/20 1141  TempSrc: Oral  PainSc: 0-No pain         Complications: No complications documented.

## 2020-04-16 ENCOUNTER — Other Ambulatory Visit: Payer: Self-pay | Admitting: Hematology and Oncology

## 2020-04-16 ENCOUNTER — Encounter (HOSPITAL_BASED_OUTPATIENT_CLINIC_OR_DEPARTMENT_OTHER): Payer: Self-pay | Admitting: Surgery

## 2020-04-16 ENCOUNTER — Telehealth: Payer: Self-pay | Admitting: *Deleted

## 2020-04-16 ENCOUNTER — Encounter: Payer: Self-pay | Admitting: *Deleted

## 2020-04-16 ENCOUNTER — Inpatient Hospital Stay

## 2020-04-16 VITALS — BP 144/85 | HR 80 | Temp 98.1°F | Resp 18

## 2020-04-16 DIAGNOSIS — C50411 Malignant neoplasm of upper-outer quadrant of right female breast: Secondary | ICD-10-CM

## 2020-04-16 DIAGNOSIS — Z5111 Encounter for antineoplastic chemotherapy: Secondary | ICD-10-CM | POA: Diagnosis not present

## 2020-04-16 DIAGNOSIS — Z171 Estrogen receptor negative status [ER-]: Secondary | ICD-10-CM

## 2020-04-16 LAB — PREGNANCY, URINE: Preg Test, Ur: NEGATIVE

## 2020-04-16 MED ORDER — SODIUM CHLORIDE 0.9 % IV SOLN
75.0000 mg/m2 | Freq: Once | INTRAVENOUS | Status: AC
  Administered 2020-04-16: 150 mg via INTRAVENOUS
  Filled 2020-04-16: qty 15

## 2020-04-16 MED ORDER — TRASTUZUMAB-DKST CHEMO 150 MG IV SOLR
8.0000 mg/kg | Freq: Once | INTRAVENOUS | Status: AC
  Administered 2020-04-16: 651 mg via INTRAVENOUS
  Filled 2020-04-16: qty 31

## 2020-04-16 MED ORDER — HEPARIN SOD (PORK) LOCK FLUSH 100 UNIT/ML IV SOLN
500.0000 [IU] | Freq: Once | INTRAVENOUS | Status: AC | PRN
  Administered 2020-04-16: 500 [IU]
  Filled 2020-04-16: qty 5

## 2020-04-16 MED ORDER — DIPHENHYDRAMINE HCL 25 MG PO CAPS
25.0000 mg | ORAL_CAPSULE | Freq: Once | ORAL | Status: AC
  Administered 2020-04-16: 25 mg via ORAL

## 2020-04-16 MED ORDER — PALONOSETRON HCL INJECTION 0.25 MG/5ML
0.2500 mg | Freq: Once | INTRAVENOUS | Status: AC
  Administered 2020-04-16: 0.25 mg via INTRAVENOUS

## 2020-04-16 MED ORDER — SODIUM CHLORIDE 0.9% FLUSH
10.0000 mL | INTRAVENOUS | Status: DC | PRN
  Administered 2020-04-16: 10 mL
  Filled 2020-04-16: qty 10

## 2020-04-16 MED ORDER — DIPHENHYDRAMINE HCL 25 MG PO CAPS
ORAL_CAPSULE | ORAL | Status: AC
  Filled 2020-04-16: qty 1

## 2020-04-16 MED ORDER — ACETAMINOPHEN 325 MG PO TABS: 650.0000 mg | ORAL_TABLET | Freq: Once | ORAL | Status: DC

## 2020-04-16 MED ORDER — SODIUM CHLORIDE 0.9 % IV SOLN
420.0000 mg | Freq: Once | INTRAVENOUS | Status: AC
  Administered 2020-04-16: 420 mg via INTRAVENOUS
  Filled 2020-04-16: qty 14

## 2020-04-16 MED ORDER — ACETAMINOPHEN 325 MG PO TABS
ORAL_TABLET | ORAL | Status: AC
  Filled 2020-04-16: qty 2

## 2020-04-16 MED ORDER — SODIUM CHLORIDE 0.9 % IV SOLN
10.0000 mg | Freq: Once | INTRAVENOUS | Status: AC
  Administered 2020-04-16: 10 mg via INTRAVENOUS
  Filled 2020-04-16: qty 10

## 2020-04-16 MED ORDER — SODIUM CHLORIDE 0.9 % IV SOLN
786.0000 mg | Freq: Once | INTRAVENOUS | Status: AC
  Administered 2020-04-16: 790 mg via INTRAVENOUS
  Filled 2020-04-16: qty 79

## 2020-04-16 MED ORDER — SODIUM CHLORIDE 0.9 % IV SOLN
Freq: Once | INTRAVENOUS | Status: AC
  Filled 2020-04-16: qty 250

## 2020-04-16 MED ORDER — PALONOSETRON HCL INJECTION 0.25 MG/5ML
INTRAVENOUS | Status: AC
  Filled 2020-04-16: qty 5

## 2020-04-16 MED ORDER — SODIUM CHLORIDE 0.9 % IV SOLN
150.0000 mg | Freq: Once | INTRAVENOUS | Status: AC
  Administered 2020-04-16: 150 mg via INTRAVENOUS
  Filled 2020-04-16: qty 150

## 2020-04-16 NOTE — Patient Instructions (Signed)
[facility] Discharge Instructions for Patients Receiving Chemotherapy  Today you received the following chemotherapy agents Taxotere, Carbo, Ogivri, Perjeta  To help prevent nausea and vomiting after your treatment, we encourage you to take your nausea medication   1) Take Dexamethasone day after chemo (Wednesday, 04/17/20)  2) Take Compazine every 6 hours as needed for nausea.  3) On day 3 (Friday, Dec3) , take Zofran twice daily as needed for nausea.     If you develop nausea and vomiting that is not controlled by your nausea medication, call the clinic.   BELOW ARE SYMPTOMS THAT SHOULD BE REPORTED IMMEDIATELY:  *FEVER GREATER THAN 100.5 F  *CHILLS WITH OR WITHOUT FEVER  NAUSEA AND VOMITING THAT IS NOT CONTROLLED WITH YOUR NAUSEA MEDICATION  *UNUSUAL SHORTNESS OF BREATH  *UNUSUAL BRUISING OR BLEEDING  TENDERNESS IN MOUTH AND THROAT WITH OR WITHOUT PRESENCE OF ULCERS  *URINARY PROBLEMS  *BOWEL PROBLEMS  UNUSUAL RASH Items with * indicate a potential emergency and should be followed up as soon as possible.  Feel free to call the clinic should you have any questions or concerns. The clinic phone number is [phone].  Please show the [facility] at check-in to the Emergency Department and triage nurse.

## 2020-04-16 NOTE — Progress Notes (Signed)
Pt discharged in no apparent distress. Pt left ambulatory with/without assistance. Pt aware of discharge instructions andverbalized understanding and had no further questions. 

## 2020-04-16 NOTE — Telephone Encounter (Signed)
Left vm for pt to return call regarding MRI results and need for further bx. Contact information provided for return call.

## 2020-04-17 ENCOUNTER — Telehealth: Payer: Self-pay | Admitting: *Deleted

## 2020-04-17 ENCOUNTER — Other Ambulatory Visit: Payer: Self-pay | Admitting: Hematology and Oncology

## 2020-04-17 DIAGNOSIS — Z171 Estrogen receptor negative status [ER-]: Secondary | ICD-10-CM

## 2020-04-17 DIAGNOSIS — C50411 Malignant neoplasm of upper-outer quadrant of right female breast: Secondary | ICD-10-CM

## 2020-04-17 NOTE — Telephone Encounter (Signed)
Spoke to pt concerning MRI results and need for MR bx. Received verbal understanding.  Pt relate did well after 1st chemo and walked 1.52miles this morning. No c/o N/V voiced. Discussed eating multiple small meals during the day.  Encourage pt to call with needs. Received verbal understanding.

## 2020-04-18 ENCOUNTER — Other Ambulatory Visit: Payer: Self-pay

## 2020-04-18 ENCOUNTER — Inpatient Hospital Stay: Attending: Hematology and Oncology

## 2020-04-18 VITALS — BP 174/115 | HR 74 | Temp 97.9°F | Resp 20

## 2020-04-18 DIAGNOSIS — Z5189 Encounter for other specified aftercare: Secondary | ICD-10-CM | POA: Diagnosis not present

## 2020-04-18 DIAGNOSIS — R5383 Other fatigue: Secondary | ICD-10-CM | POA: Insufficient documentation

## 2020-04-18 DIAGNOSIS — Z5112 Encounter for antineoplastic immunotherapy: Secondary | ICD-10-CM | POA: Insufficient documentation

## 2020-04-18 DIAGNOSIS — R197 Diarrhea, unspecified: Secondary | ICD-10-CM | POA: Insufficient documentation

## 2020-04-18 DIAGNOSIS — Z79899 Other long term (current) drug therapy: Secondary | ICD-10-CM | POA: Insufficient documentation

## 2020-04-18 DIAGNOSIS — C50411 Malignant neoplasm of upper-outer quadrant of right female breast: Secondary | ICD-10-CM | POA: Diagnosis not present

## 2020-04-18 DIAGNOSIS — Z171 Estrogen receptor negative status [ER-]: Secondary | ICD-10-CM | POA: Diagnosis not present

## 2020-04-18 DIAGNOSIS — E039 Hypothyroidism, unspecified: Secondary | ICD-10-CM | POA: Insufficient documentation

## 2020-04-18 DIAGNOSIS — Z5111 Encounter for antineoplastic chemotherapy: Secondary | ICD-10-CM | POA: Diagnosis present

## 2020-04-18 MED ORDER — PEGFILGRASTIM-JMDB 6 MG/0.6ML ~~LOC~~ SOSY
6.0000 mg | PREFILLED_SYRINGE | Freq: Once | SUBCUTANEOUS | Status: AC
  Administered 2020-04-18: 6 mg via SUBCUTANEOUS

## 2020-04-18 MED ORDER — PEGFILGRASTIM-JMDB 6 MG/0.6ML ~~LOC~~ SOSY
PREFILLED_SYRINGE | SUBCUTANEOUS | Status: AC
  Filled 2020-04-18: qty 0.6

## 2020-04-18 NOTE — Patient Instructions (Signed)

## 2020-04-19 ENCOUNTER — Encounter: Payer: Self-pay | Admitting: Genetic Counselor

## 2020-04-19 ENCOUNTER — Telehealth: Payer: Self-pay

## 2020-04-19 ENCOUNTER — Telehealth: Payer: Self-pay | Admitting: Genetic Counselor

## 2020-04-19 ENCOUNTER — Ambulatory Visit: Payer: Self-pay | Admitting: Genetic Counselor

## 2020-04-19 DIAGNOSIS — Z808 Family history of malignant neoplasm of other organs or systems: Secondary | ICD-10-CM

## 2020-04-19 DIAGNOSIS — N920 Excessive and frequent menstruation with regular cycle: Secondary | ICD-10-CM | POA: Insufficient documentation

## 2020-04-19 DIAGNOSIS — D259 Leiomyoma of uterus, unspecified: Secondary | ICD-10-CM | POA: Insufficient documentation

## 2020-04-19 DIAGNOSIS — Z1379 Encounter for other screening for genetic and chromosomal anomalies: Secondary | ICD-10-CM | POA: Insufficient documentation

## 2020-04-19 DIAGNOSIS — Z171 Estrogen receptor negative status [ER-]: Secondary | ICD-10-CM

## 2020-04-19 DIAGNOSIS — C50411 Malignant neoplasm of upper-outer quadrant of right female breast: Secondary | ICD-10-CM

## 2020-04-19 DIAGNOSIS — Z803 Family history of malignant neoplasm of breast: Secondary | ICD-10-CM

## 2020-04-19 DIAGNOSIS — Z806 Family history of leukemia: Secondary | ICD-10-CM

## 2020-04-19 DIAGNOSIS — Z8041 Family history of malignant neoplasm of ovary: Secondary | ICD-10-CM

## 2020-04-19 NOTE — Progress Notes (Signed)
HPI:  [name] was previously seen in the [facility] due to a personal and family history of cancer and concerns regarding a hereditary predisposition to cancer. Please refer to our prior cancer genetics clinic note for more information regarding our discussion, assessment and recommendations, at the time. [name] recent genetic test results were disclosed to her, as were recommendations warranted by these results. These results and recommendations are discussed in more detail below.  CANCER HISTORY:  In November 2021, at the age of 49, [name] was diagnosed with invasive ductal carcinoma of the right breast. The preliminary treatment plan includes neoadjuvant chemotherapy, breast conserving surgery, and adjuvant radiation.     Oncology History  Malignant neoplasm of upper-outer quadrant of right breast in female, estrogen receptor negative ([facility])  03/28/2020 Initial Diagnosis   Patient palpated a right breast mass x2 wks. Mammogram and US showed a 2.0cm mass at the 10 o'clock position in the right breast and two enlarged right axillary lymph nodes, 2.0cm and 1.7cm. 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   04/16/2020 -  Chemotherapy   The patient had dexamethasone (DECADRON) 4 MG tablet, 4 mg (100 % of original dose 4 mg), Oral, Daily, 1 of 1 cycle, Start date: 04/09/2020, End date: -- Dose modification: 4 mg (original dose 4 mg, Cycle 0) palonosetron (ALOXI) injection 0.25 mg, 0.25 mg, Intravenous,  Once, 1 of 6 cycles Administration: 0.25 mg (04/16/2020) pegfilgrastim-jmdb (FULPHILA) injection 6 mg, 6 mg, Subcutaneous,  Once, 1 of 6 cycles CARBOplatin (PARAPLATIN) 790 mg in sodium chloride 0.9 % 250 mL chemo infusion, 790 mg (112.7 % of original dose 697.2 mg), Intravenous,  Once, 1 of 6 cycles Dose modification:   (original dose 697.2 mg, Cycle 1) Administration: 790 mg (04/16/2020) DOCEtaxel  (TAXOTERE) 150 mg in sodium chloride 0.9 % 250 mL chemo infusion, 75 mg/m2 = 150 mg, Intravenous,  Once, 1 of 6 cycles Administration: 150 mg (04/16/2020) fosaprepitant (EMEND) 150 mg in sodium chloride 0.9 % 145 mL IVPB, 150 mg, Intravenous,  Once, 1 of 6 cycles Administration: 150 mg (04/16/2020) pertuzumab (PERJETA) 420 mg in sodium chloride 0.9 % 250 mL chemo infusion, 420 mg (100 % of original dose 420 mg), Intravenous, Once, 1 of 6 cycles Dose modification: 420 mg (original dose 420 mg, Cycle 1, Reason: Provider Judgment) Administration: 420 mg (04/16/2020) trastuzumab-dkst (OGIVRI) 651 mg in sodium chloride 0.9 % 250 mL chemo infusion, 8 mg/kg = 651 mg, Intravenous,  Once, 1 of 6 cycles Administration: 651 mg (04/16/2020)  for chemotherapy treatment.    04/18/2020 Genetic Testing   Negative genetic testing: no pathogenic variants detected in [facility] Multi-Cancer Panel.  Variants of uncertain significance detected in CDH1 at c.1289T>G (p.Val430Gly) and EGFR at c.2873G>A (p.Arg958His).  The report date is April 18, 2020.   The Multi-Cancer Panel offered by [facility] includes sequencing and/or deletion duplication testing of the following 85 genes: AIP, ALK, APC, ATM, AXIN2,BAP1,  BARD1, BLM, BMPR1A, BRCA1, BRCA2, BRIP1, CASR, CDC73, CDH1, CDK4, CDKN1B, CDKN1C, CDKN2A (p14ARF), CDKN2A (p16INK4a), CEBPA, CHEK2, CTNNA1, DICER1, DIS3L2, EGFR, EPCAM (Deletion/duplication testing only), FH, FLCN, GATA2, GPC3, GREM1 (Promoter region deletion/duplication testing only), HOXB13 (c.251G>A, p.Gly84Glu), HRAS, KIT, MAX, MEN1, MET, MITF (c.952G>A, p.Glu318Lys variant only), MLH1, MSH2, MSH3, MSH6, MUTYH, NBN, NF1, NF2, NTHL1, PALB2, PDGFRA, PHOX2B, PMS2, POLD1, POLE, POT1, PRKAR1A, PTCH1, PTEN, RAD50, RAD51C, RAD51D, RB1, RECQL4, RET, RNF43, RUNX1, SDHAF2, SDHA (sequence changes  only), SDHB, SDHC, SDHD, SMAD4, SMARCA4, SMARCB1, SMARCE1, STK11, SUFU, TERC, TERT, TMEM127, TP53, TSC1, TSC2, VHL, WRN and WT1.       FAMILY HISTORY:  We obtained a detailed, 4-generation family history.  Significant diagnoses are listed below: Family History  Problem Relation Age of Onset  . Leukemia Father        dx > 50  . Ovarian cancer Maternal Grandmother 2  . Bone cancer Paternal Grandfather        dx unknown age  . Cancer Maternal Uncle        Mouth and throad; dx > 50  . Breast cancer Other        MGM's niece, dx unknown age  . Cancer Maternal Uncle        unknown type; dx > 50     [name] has two biological children, ages 25 and 54.  [name] has one brother, one full sister, and one maternal half sister, all without a cancer history.  [name] mother is 39 years old and has not had cancer.  [name] maternal uncles had mouth and throat cancer (diagnosed after age 47) and an unknown type of cancer (diagnosed after age 46).  [name] maternal grandmother was diagnosed with ovarian cancer at age 66 and passed away at age 68.  [name] reported a distant maternal cousin with breast cancer.  No other maternal family history of cancer was reported.  [name] father passed away at age 83 and was diagnosed with leukemia after the age of 15.  No other paternal family history of cancer was reported.   [name] is unaware of previous family history of genetic testing for hereditary cancer risks. Patient's maternal ancestors are of African American descent, and paternal ancestors are of African American descent. There is no reported Ashkenazi Jewish ancestry. There is no known consanguinity.  GENETIC TEST RESULTS: Genetic testing reported out on April 18, 2020.  The [facility] Multi-Cancer Panel found no pathogenic mutations. The Multi-Cancer Panel offered by [facility] includes sequencing and/or deletion duplication testing of the following 85 genes: AIP, ALK, APC, ATM, AXIN2,BAP1,  BARD1, BLM, BMPR1A, BRCA1, BRCA2, BRIP1, CASR, CDC73, CDH1, CDK4, CDKN1B, CDKN1C, CDKN2A (p14ARF), CDKN2A  (p16INK4a), CEBPA, CHEK2, CTNNA1, DICER1, DIS3L2, EGFR, EPCAM (Deletion/duplication testing only), FH, FLCN, GATA2, GPC3, GREM1 (Promoter region deletion/duplication testing only), HOXB13 (c.251G>A, p.Gly84Glu), HRAS, KIT, MAX, MEN1, MET, MITF (c.952G>A, p.Glu318Lys variant only), MLH1, MSH2, MSH3, MSH6, MUTYH, NBN, NF1, NF2, NTHL1, PALB2, PDGFRA, PHOX2B, PMS2, POLD1, POLE, POT1, PRKAR1A, PTCH1, PTEN, RAD50, RAD51C, RAD51D, RB1, RECQL4, RET, RNF43, RUNX1, SDHAF2, SDHA (sequence changes only), SDHB, SDHC, SDHD, SMAD4, SMARCA4, SMARCB1, SMARCE1, STK11, SUFU, TERC, TERT, TMEM127, TP53, TSC1, TSC2, VHL, WRN and WT1.   The test report has been scanned into EPIC and is located under the Molecular Pathology section of the Results Review tab.  A portion of the result report is included below for reference.    We discussed with [name] that because current genetic testing is not perfect, it is possible there may be a gene mutation in one of these genes that current testing cannot detect, but that chance is small.  We also discussed, that there could be another gene that has not yet been discovered, or that we have not yet tested, that is responsible for the cancer diagnoses in the family. It is also possible there is a hereditary cause for the cancer in the family that [name] did not inherit and therefore was  not identified in her testing.  Therefore, it is important to remain in touch with cancer genetics in the future so that we can continue to offer [name] the most up to date genetic testing.   Genetic testing did identify two variants of uncertain significance (VUS) - one in the CDH1 gene called  c.1289T>G (p.Val430Gly) and a second in the EGFR gene called c.2873G>A (p.Arg958His).  At this time, it is unknown if these variants are associated with increased cancer risk or if they are normal findings, but most variants such as these get reclassified to being inconsequential. They should not be used to  make medical management decisions. With time, we suspect the lab will determine the significance of these variants, if any. If we do learn more about them, we will try to contact [name] to discuss it further. However, it is important to stay in touch with [place] periodically and keep the address and phone number up to date.  ADDITIONAL GENETIC TESTING: We discussed with [name] that her genetic testing was fairly extensive.  If there are genes identified to increase cancer risk that can be analyzed in the future, we would be happy to discuss and coordinate this testing at that time.    CANCER SCREENING RECOMMENDATIONS: [name] test result is considered negative (normal).  This means that we have not identified a hereditary cause for her personal and family history of cancer at this time. Most cancers happen by chance and this negative test suggests that her cancer may fall into this category.    While reassuring, this does not definitively rule out a hereditary predisposition to cancer. It is still possible that there could be genetic mutations that are undetectable by current technology. There could be genetic mutations in genes that have not been tested or identified to increase cancer risk.  Therefore, it is recommended she continue to follow the cancer management and screening guidelines provided by her oncology and primary healthcare provider.   An individual's cancer risk and medical management are not determined by genetic test results alone. Overall cancer risk assessment incorporates additional factors, including personal medical history, family history, and any available genetic information that may result in a personalized plan for cancer prevention and surveillance  RECOMMENDATIONS FOR FAMILY MEMBERS:  Individuals in this family might be at some increased risk of developing cancer, over the general population risk, simply due to the family history of cancer.  We recommended women in  this family have a yearly mammogram beginning at age 29, or 69 years younger than the earliest onset of cancer, an annual clinical breast exam, and perform monthly breast self-exams. Women in this family should also have a gynecological exam as recommended by their primary provider. All family members should be referred for colonoscopy starting at age 5.  It is also possible there is a hereditary cause for the cancer in [name]'s family that she did not inherit and therefore was not identified in her.  Based on [name]'s family history of ovarian cancer in her maternal grandmother, we recommended her mother have genetic counseling and testing. [name] will let us know if we can be of any assistance in coordinating genetic counseling and/or testing for this family member.   FOLLOW-UP: Lastly, we discussed with [name] that cancer genetics is a rapidly advancing field and it is possible that new genetic tests will be appropriate for her and/or her family members in the future. We encouraged her to remain in  contact with cancer genetics on an annual basis so we can update her personal and family histories and let her know of advances in cancer genetics that may benefit this family.   Our contact number was provided. [name] questions were answered to her satisfaction, and she knows she is welcome to call us at anytime with additional questions or concerns.   Landon Bassford M. Joette Catching, [place], [place] Film/video [email or web address] (P) [phone]

## 2020-04-19 NOTE — Telephone Encounter (Signed)
Called to check in with Shannon Kane about her blood pressures issues from yesterday.  She said she felt very foggy yesterday and has some acid reflux issues that may have caused her blood pressure to be higher. It was 174/115 yesterday and she retook her BP at home and it was 143/85 which was much better.  She also reported that she felt better after having a bowel movement today.  She had 2 BMs and the second one was more like diarrhea so she took 2 tabs of her [facility] antidiarrheal medication.  She has some dry mouth issues as well but she is drinking a lot of water to stay hydrated.  She said she would call us with any further issues and she also will take her blood pressure 3 times per day to keep an eye on it.  Gardiner Rhyme, RN

## 2020-04-19 NOTE — Telephone Encounter (Signed)
Revealed negative genetic testing and variants of uncertain significance in CDH1 and EGFR.  Discussed that we do not know why she has breast cancer or why there is cancer in the family. It could be sporadic, due to a different gene that we are not testing, or maybe our current technology may not be able to pick something up.  It will be important for her to keep in contact with genetics to keep up with whether additional testing may be needed.

## 2020-04-21 NOTE — Progress Notes (Signed)
Patient Care Team: [name], MD as PCP - General (Internal Medicine) Rockwell Germany, RN as Oncology Nurse Navigator Mauro Kaufmann, RN as Oncology Nurse Navigator Coralie Keens, MD as Consulting Physician (General Surgery) Nicholas Lose, MD as Consulting Physician (Hematology and Oncology) [name], MD as Consulting Physician (Radiation Oncology)  DIAGNOSIS:    ICD-10-CM   1. Malignant neoplasm of upper-outer quadrant of right breast in female, estrogen receptor negative ([place])  C50.411 CBC with Differential ([place] Only)   Z17.1 [place] ([place] only)    Thyroid Panel With TSH    SUMMARY OF ONCOLOGIC HISTORY: Oncology History  Malignant neoplasm of upper-outer quadrant of right breast in female, estrogen receptor negative ([place])  03/28/2020 Initial Diagnosis   Patient palpated a right breast mass x2 wks. Mammogram and US showed a 2.0cm mass at the 10 o'clock position in the right breast and two enlarged right axillary lymph nodes, 2.0cm and 1.7cm. 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   04/16/2020 -  Chemotherapy   The patient had dexamethasone (DECADRON) 4 MG tablet, 4 mg (100 % of original dose 4 mg), Oral, Daily, 1 of 1 cycle, Start date: 04/09/2020, End date: -- Dose modification: 4 mg (original dose 4 mg, Cycle 0) palonosetron (ALOXI) injection 0.25 mg, 0.25 mg, Intravenous,  Once, 1 of 6 cycles Administration: 0.25 mg (04/16/2020) pegfilgrastim-jmdb (FULPHILA) injection 6 mg, 6 mg, Subcutaneous,  Once, 1 of 6 cycles Administration: 6 mg (04/18/2020) CARBOplatin (PARAPLATIN) 790 mg in sodium chloride 0.9 % 250 mL chemo infusion, 790 mg (112.7 % of original dose 697.2 mg), Intravenous,  Once, 1 of 6 cycles Dose modification:   (original dose 697.2 mg, Cycle 1) Administration: 790 mg (04/16/2020) DOCEtaxel (TAXOTERE) 150 mg in sodium chloride 0.9 % 250 mL chemo infusion, 75 mg/m2 = 150  mg, Intravenous,  Once, 1 of 6 cycles Administration: 150 mg (04/16/2020) fosaprepitant (EMEND) 150 mg in sodium chloride 0.9 % 145 mL IVPB, 150 mg, Intravenous,  Once, 1 of 6 cycles Administration: 150 mg (04/16/2020) pertuzumab (PERJETA) 420 mg in sodium chloride 0.9 % 250 mL chemo infusion, 420 mg (100 % of original dose 420 mg), Intravenous, Once, 1 of 6 cycles Dose modification: 420 mg (original dose 420 mg, Cycle 1, Reason: Provider Judgment) Administration: 420 mg (04/16/2020) trastuzumab-dkst (OGIVRI) 651 mg in sodium chloride 0.9 % 250 mL chemo infusion, 8 mg/kg = 651 mg, Intravenous,  Once, 1 of 6 cycles Administration: 651 mg (04/16/2020)  for chemotherapy treatment.    04/18/2020 Genetic Testing   Negative genetic testing: no pathogenic variants detected in [facility] Multi-Cancer Panel.  Variants of uncertain significance detected in CDH1 at c.1289T>G (p.Val430Gly) and EGFR at c.2873G>A (p.Arg958His).  The report date is April 18, 2020.   The Multi-Cancer Panel offered by [facility] includes sequencing and/or deletion duplication testing of the following 85 genes: AIP, ALK, APC, ATM, AXIN2,BAP1,  BARD1, BLM, BMPR1A, BRCA1, BRCA2, BRIP1, CASR, CDC73, CDH1, CDK4, CDKN1B, CDKN1C, CDKN2A (p14ARF), CDKN2A (p16INK4a), CEBPA, CHEK2, CTNNA1, DICER1, DIS3L2, EGFR, EPCAM (Deletion/duplication testing only), FH, FLCN, GATA2, GPC3, GREM1 (Promoter region deletion/duplication testing only), HOXB13 (c.251G>A, p.Gly84Glu), HRAS, KIT, MAX, MEN1, MET, MITF (c.952G>A, p.Glu318Lys variant only), MLH1, MSH2, MSH3, MSH6, MUTYH, NBN, NF1, NF2, NTHL1, PALB2, PDGFRA, PHOX2B, PMS2, POLD1, POLE, POT1, PRKAR1A, PTCH1, PTEN, RAD50, RAD51C, RAD51D, RB1, RECQL4, RET, RNF43, RUNX1, SDHAF2, SDHA (sequence changes only), SDHB, SDHC, SDHD, SMAD4, SMARCA4, SMARCB1, SMARCE1, STK11, SUFU,  TERC, TERT, TMEM127, TP53, TSC1, TSC2, VHL, WRN and WT1.      CHIEF COMPLIANT: Cycle 1 day 8 TCH Perjeta  INTERVAL HISTORY: Shannon Kane is  a 49 y.o. with above-mentioned history of right breast cancer currently on neoadjuvant chemotherapy with [place]. Echo on 04/09/20 showed an ejection fraction of 60-65%.  After cycle 1 of chemotherapy she did okay for the first 3 days but after the growth factor injection she felt extremely tired and weak and achy all over.  She also had diarrhea which was mild and she took Imodium which subsided the diarrhea.  She just has no energy.  This morning she even had a breast MRI guided biopsy and she appears to be extremely tired from that.  She has extremely poor taste and appetite.  ALLERGIES:  is allergic to amoxicillin, ciprofloxacin, and penicillins.  MEDICATIONS:  Current Outpatient Medications  Medication Sig Dispense Refill  . Cholecalciferol (VITAMIN D-3 PO) Take 6,000 Units by mouth daily.     . clotrimazole-betamethasone (LOTRISONE) cream clotrimazole-betamethasone 1 %-0.05 % topical cream  APPLY TO AFFECTED AREA TWICE A DAY MORNING AND EVENING FOR 2 WEEKS    . dexamethasone (DECADRON) 4 MG tablet Take 1 tablet (4 mg total) by mouth daily. 1 tab day before chemo and 1 tab day after chemo 12 tablet 0  . hydrochlorothiazide (HYDRODIURIL) 12.5 MG tablet hydrochlorothiazide 12.5 mg tablet    . Iron-FA-B Cmp-C-Biot-Probiotic (FUSION PLUS PO) Fusion Plus 130 mg iron-1,250 mcg capsule  Take 1 capsule every day by oral route.    . lidocaine-prilocaine (EMLA) cream Apply to affected area once 30 g 3  . lisinopril (ZESTRIL) 20 MG tablet Take 1/2 tablet by mouth  daily 30 tablet 1  . LORazepam (ATIVAN) 0.5 MG tablet Take 1 tablet (0.5 mg total) by mouth at bedtime as needed for sleep. 30 tablet 0  . MAGNESIUM CITRATE PO Take 500 mcg by mouth. Only when having trouble sleeping    . Norethindrone-Ethinyl Estradiol-Fe Biphas (LO LOESTRIN FE) 1 MG-10 MCG / 10 MCG tablet Lo Loestrin Fe 1 mg-10 mcg (24)/10 mcg (2) tablet  Take 1 tablet every day by oral route.    . ondansetron (ZOFRAN) 8 MG tablet Take  1 tablet (8 mg total) by mouth 2 (two) times daily as needed (Nausea or vomiting). Start on the third day after chemotherapy. 30 tablet 1  . pantoprazole (PROTONIX) 20 MG tablet Take 1 tablet (20 mg total) by mouth daily. 30 tablet 2  . prochlorperazine (COMPAZINE) 10 MG tablet Take 1 tablet (10 mg total) by mouth every 6 (six) hours as needed (Nausea or vomiting). 30 tablet 1  . SYNTHROID 75 MCG tablet TAKE 1 TABLET ON MONDAY THROUGH SUNDAY 90 tablet 3  . triamcinolone (KENALOG) 0.1 % triamcinolone acetonide 0.1 % topical cream  APPLY 2 TIMES EVERY DAY A THIN LAYER TO THE AFFECTED AREA(S)     No current facility-administered medications for this visit.    PHYSICAL EXAMINATION: ECOG PERFORMANCE STATUS: 1 - Symptomatic but completely ambulatory  Vitals:   04/22/20 1255  BP: 140/70  Pulse: (!) 103  Resp: 18  Temp: 99.1 F (37.3 C)  SpO2: 97%   Filed Weights   04/22/20 1255  Weight: 179 lb 14.4 oz (81.6 kg)    LABORATORY DATA:  I have reviewed the data as listed CMP Latest Ref Rng & Units 04/22/2020 04/15/2020 04/03/2020  Glucose 70 - 99 mg/dL 115(H) 106(H) 101(H)  BUN 6 - 20 mg/dL 15  8 10  Creatinine 0.44 - 1.00 mg/dL 1.18(H) 0.70 0.93  Sodium 135 - 145 mmol/L 137 139 143  Potassium 3.5 - 5.1 mmol/L 4.1 3.8 3.9  Chloride 98 - 111 mmol/L 100 105 107  CO2 22 - 32 mmol/L 32 25 28  Calcium 8.9 - 10.3 mg/dL 9.4 8.6(L) 9.3  Total Protein 6.5 - 8.1 g/dL 7.6 6.4(L) 7.9  Total Bilirubin 0.3 - 1.2 mg/dL 0.3 0.5 0.7  Alkaline Phos 38 - 126 U/L 88 67 92  AST 15 - 41 U/L 19 21 21   ALT 0 - 44 U/L 19 15 14     Lab Results  Component Value Date   WBC 2.9 (L) 04/22/2020   HGB 13.6 04/22/2020   HCT 40.9 04/22/2020   MCV 91.5 04/22/2020   PLT 164 04/22/2020   NEUTROABS 0.6 (L) 04/22/2020    ASSESSMENT & PLAN:  Malignant neoplasm of upper-outer quadrant of right breast in female, estrogen receptor negative ([place]) 03/28/2020:Patient palpated a right breast mass x2 wks. Mammogram and US  showed a 2.0cm mass at the 10 o'clock position in the right breast and two enlarged right axillary lymph nodes, 2.0cm and 1.7cm. 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  Treatment plan: 1. Neoadjuvant chemotherapy with TCH Perjeta 6 cycles followed by Herceptin Perjeta versus Kadcyla maintenance for 1 year 2. Followed by breast conserving surgery with targeted node dissection 3. Followed by adjuvant radiation therapy if patient had lumpectomy ----------------------------------------------------------------------------------------------------------------------------------------- Current treatment: Cycle 1 day 8 TCH Perjeta Echocardiogram 04/09/2020: EF 60 to 65%  Chemo toxicities: 1.  Severe fatigue 2. mild diarrhea: Takes Imodium and it responded 3.  History of hypothyroidism: We will check TSH with the next labs. 4.  Muscle and body aches and pains  Her ANC today 0.6 and therefore we will keep the dosage the same. Return to clinic in 2 weeks for cycle 2. She tells me that she enjoyed her treatment at [facility] and after the next cycle she may consider moving her treatments    Orders Placed This Encounter  Procedures  . CBC with Differential ([facility] Center Only)    Standing Status:   Future    Standing Expiration Date:   04/22/2021  . CMP ([place] only)    Standing Status:   Future    Standing Expiration Date:   04/22/2021  . Thyroid Panel With TSH    Standing Status:   Standing    Number of Occurrences:   10    Standing Expiration Date:   04/22/2021   The patient has a good understanding of the overall plan. she agrees with it. she will call with any problems that may develop before the next visit here.  Total time spent: 30 mins including face to face time and time spent for planning, charting and coordination of care  Nicholas Lose, MD 04/22/2020  I, Cloyde Reams Dorshimer, am acting as scribe for Dr. Nicholas Lose.  I have reviewed the above documentation for accuracy and completeness, and I agree with the above.

## 2020-04-22 ENCOUNTER — Encounter: Payer: Self-pay | Admitting: *Deleted

## 2020-04-22 ENCOUNTER — Ambulatory Visit
Admission: RE | Admit: 2020-04-22 | Discharge: 2020-04-22 | Disposition: A | Discharge status: Home / Self Care | Source: Ambulatory Visit | Attending: Hematology and Oncology | Admitting: Hematology and Oncology

## 2020-04-22 ENCOUNTER — Other Ambulatory Visit: Payer: Self-pay

## 2020-04-22 ENCOUNTER — Inpatient Hospital Stay: Admitting: Hematology and Oncology

## 2020-04-22 ENCOUNTER — Inpatient Hospital Stay

## 2020-04-22 ENCOUNTER — Other Ambulatory Visit (HOSPITAL_COMMUNITY): Payer: Self-pay | Admitting: Diagnostic Radiology

## 2020-04-22 DIAGNOSIS — Z171 Estrogen receptor negative status [ER-]: Secondary | ICD-10-CM

## 2020-04-22 DIAGNOSIS — C50411 Malignant neoplasm of upper-outer quadrant of right female breast: Secondary | ICD-10-CM

## 2020-04-22 DIAGNOSIS — Z5111 Encounter for antineoplastic chemotherapy: Secondary | ICD-10-CM | POA: Diagnosis not present

## 2020-04-22 LAB — CBC WITH DIFFERENTIAL (CANCER CENTER ONLY)
Abs Immature Granulocytes: 0.03 10*3/uL (ref 0.00–0.07)
Basophils Absolute: 0.1 10*3/uL (ref 0.0–0.1)
Basophils Relative: 2 %
Eosinophils Absolute: 0 10*3/uL (ref 0.0–0.5)
Eosinophils Relative: 0 %
HCT: 40.9 % (ref 36.0–46.0)
Hemoglobin: 13.6 g/dL (ref 12.0–15.0)
Immature Granulocytes: 1 %
Lymphocytes Relative: 37 %
Lymphs Abs: 1.1 10*3/uL (ref 0.7–4.0)
MCH: 30.4 pg (ref 26.0–34.0)
MCHC: 33.3 g/dL (ref 30.0–36.0)
MCV: 91.5 fL (ref 80.0–100.0)
Monocytes Absolute: 1.1 10*3/uL — ABNORMAL HIGH (ref 0.1–1.0)
Monocytes Relative: 38 %
Neutro Abs: 0.6 10*3/uL — ABNORMAL LOW (ref 1.7–7.7)
Neutrophils Relative %: 22 %
Platelet Count: 164 10*3/uL (ref 150–400)
RBC: 4.47 MIL/uL (ref 3.87–5.11)
RDW: 13.3 % (ref 11.5–15.5)
WBC Count: 2.9 10*3/uL — ABNORMAL LOW (ref 4.0–10.5)
nRBC: 0 % (ref 0.0–0.2)

## 2020-04-22 LAB — CMP (CANCER CENTER ONLY)
ALT: 19 U/L (ref 0–44)
AST: 19 U/L (ref 15–41)
Albumin: 3.7 g/dL (ref 3.5–5.0)
Alkaline Phosphatase: 88 U/L (ref 38–126)
Anion gap: 5 (ref 5–15)
BUN: 15 mg/dL (ref 6–20)
CO2: 32 mmol/L (ref 22–32)
Calcium: 9.4 mg/dL (ref 8.9–10.3)
Chloride: 100 mmol/L (ref 98–111)
Creatinine: 1.18 mg/dL — ABNORMAL HIGH (ref 0.44–1.00)
GFR, Estimated: 56 mL/min — ABNORMAL LOW (ref >60)
Glucose, Bld: 115 mg/dL — ABNORMAL HIGH (ref 70–99)
Potassium: 4.1 mmol/L (ref 3.5–5.1)
Sodium: 137 mmol/L (ref 135–145)
Total Bilirubin: 0.3 mg/dL (ref 0.3–1.2)
Total Protein: 7.6 g/dL (ref 6.5–8.1)

## 2020-04-22 MED ORDER — PANTOPRAZOLE SODIUM 20 MG PO TBEC: 20.0000 mg | DELAYED_RELEASE_TABLET | Freq: Every day | ORAL | 2 refills | Status: DC

## 2020-04-22 MED ORDER — GADOBUTROL 1 MMOL/ML IV SOLN
8.0000 mL | Freq: Once | INTRAVENOUS | Status: AC | PRN
  Administered 2020-04-22: 8 mL via INTRAVENOUS

## 2020-04-22 MED ORDER — SODIUM CHLORIDE 0.9% FLUSH
10.0000 mL | Freq: Once | INTRAVENOUS | Status: DC
  Filled 2020-04-22: qty 10

## 2020-04-22 NOTE — Assessment & Plan Note (Signed)
03/28/2020:Patient palpated a right breast mass x2 wks. Mammogram and US showed a 2.0cm mass at the 10 o'clock position in the right breast and two enlarged right axillary lymph nodes, 2.0cm and 1.7cm. 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  Treatment plan: 1. Neoadjuvant chemotherapy with TCH Perjeta 6 cycles followed by Herceptin Perjeta versus Kadcyla maintenance for 1 year 2. Followed by breast conserving surgery with targeted node dissection 3. Followed by adjuvant radiation therapy if patient had lumpectomy ----------------------------------------------------------------------------------------------------------------------------------------- Current treatment: Cycle 1 day 1 [place] Perjeta Echocardiogram 04/09/2020: EF 60 to 65% Chemo education completed, chemo consent obtained, labs reviewed Return to clinic in 1 week for toxicity check

## 2020-04-22 NOTE — Patient Instructions (Signed)

## 2020-04-25 ENCOUNTER — Telehealth: Payer: Self-pay | Admitting: Hematology and Oncology

## 2020-04-25 NOTE — Telephone Encounter (Signed)
Scheduled per 1/26 los. Called pt and left a msg  

## 2020-04-30 ENCOUNTER — Other Ambulatory Visit: Payer: Self-pay | Admitting: Internal Medicine

## 2020-05-02 ENCOUNTER — Other Ambulatory Visit: Payer: Self-pay | Admitting: Obstetrics & Gynecology

## 2020-05-06 NOTE — Assessment & Plan Note (Signed)
03/28/2020:Patient palpated a right breast mass x2 wks. Mammogram and US showed a 2.0cm mass at the 10 o'clock position in the right breast and two enlarged right axillary lymph nodes, 2.0cm and 1.7cm. 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  Treatment plan: 1. Neoadjuvant chemotherapy with TCH Perjeta 6 cycles followed by HerceptinPerjeta versus Kadcylamaintenance for 1 year 2. Followed by breast conserving surgerywith targeted node dissection 3. Followed by adjuvant radiation therapy if patient had lumpectomy ----------------------------------------------------------------------------------------------------------------------------------------- Current treatment: Cycle 2 [place] Perjeta Echocardiogram 04/09/2020: EF 60 to 65%  Chemo toxicities: 1.  Severe fatigue 2. mild diarrhea: Takes Imodium and it responded 3.  History of hypothyroidism: We will check TSH with the next labs. 4.  Muscle and body aches and pains  Her ANC today 0.6 and therefore we will keep the dosage the same. Return to clinic in 3 weeks for cycle 3. She tells me that she enjoyed her treatment at [facility] and after the next cycle she may consider moving her treatments

## 2020-05-06 NOTE — Progress Notes (Signed)
Patient Care Team: Shannon Chard, MD as PCP - General (Internal Medicine) Rockwell Germany, RN as Oncology Nurse Navigator Mauro Kaufmann, RN as Oncology Nurse Navigator Coralie Keens, MD as Consulting Physician (General Surgery) Nicholas Lose, MD as Consulting Physician (Hematology and Oncology) [name], MD as Consulting Physician (Radiation Oncology)  DIAGNOSIS:    ICD-10-CM   1. Malignant neoplasm of upper-outer quadrant of right breast in female, estrogen receptor negative ([place])  C50.411    Z17.1     SUMMARY OF ONCOLOGIC HISTORY: Oncology History  Malignant neoplasm of upper-outer quadrant of right breast in female, estrogen receptor negative ([place])  03/28/2020 Initial Diagnosis   Patient palpated a right breast mass x2 wks. Mammogram and US showed a 2.0cm mass at the 10 o'clock position in the right breast and two enlarged right axillary lymph nodes, 2.0cm and 1.7cm. 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   04/16/2020 -  Chemotherapy   The patient had dexamethasone (DECADRON) 4 MG tablet, 4 mg (100 % of original dose 4 mg), Oral, Daily, 1 of 1 cycle, Start date: 04/09/2020, End date: -- Dose modification: 4 mg (original dose 4 mg, Cycle 0) palonosetron (ALOXI) injection 0.25 mg, 0.25 mg, Intravenous,  Once, 1 of 6 cycles Administration: 0.25 mg (04/16/2020) pegfilgrastim-jmdb (FULPHILA) injection 6 mg, 6 mg, Subcutaneous,  Once, 1 of 6 cycles Administration: 6 mg (04/18/2020) CARBOplatin (PARAPLATIN) 790 mg in sodium chloride 0.9 % 250 mL chemo infusion, 790 mg (112.7 % of original dose 697.2 mg), Intravenous,  Once, 1 of 6 cycles Dose modification:   (original dose 697.2 mg, Cycle 1) Administration: 790 mg (04/16/2020) DOCEtaxel (TAXOTERE) 150 mg in sodium chloride 0.9 % 250 mL chemo infusion, 75 mg/m2 = 150 mg, Intravenous,  Once, 1 of 6 cycles Administration: 150 mg (04/16/2020) fosaprepitant  (EMEND) 150 mg in sodium chloride 0.9 % 145 mL IVPB, 150 mg, Intravenous,  Once, 1 of 6 cycles Administration: 150 mg (04/16/2020) pertuzumab (PERJETA) 420 mg in sodium chloride 0.9 % 250 mL chemo infusion, 420 mg (100 % of original dose 420 mg), Intravenous, Once, 1 of 6 cycles Dose modification: 420 mg (original dose 420 mg, Cycle 1, Reason: Provider Judgment) Administration: 420 mg (04/16/2020) trastuzumab-dkst (OGIVRI) 651 mg in sodium chloride 0.9 % 250 mL chemo infusion, 8 mg/kg = 651 mg, Intravenous,  Once, 1 of 6 cycles Administration: 651 mg (04/16/2020)  for chemotherapy treatment.    04/18/2020 Genetic Testing   Negative genetic testing: no pathogenic variants detected in [facility] Multi-Cancer Panel.  Variants of uncertain significance detected in CDH1 at c.1289T>G (p.Val430Gly) and EGFR at c.2873G>A (p.Arg958His).  The report date is April 18, 2020.   The Multi-Cancer Panel offered by [facility] includes sequencing and/or deletion duplication testing of the following 85 genes: AIP, ALK, APC, ATM, AXIN2,BAP1,  BARD1, BLM, BMPR1A, BRCA1, BRCA2, BRIP1, CASR, CDC73, CDH1, CDK4, CDKN1B, CDKN1C, CDKN2A (p14ARF), CDKN2A (p16INK4a), CEBPA, CHEK2, CTNNA1, DICER1, DIS3L2, EGFR, EPCAM (Deletion/duplication testing only), FH, FLCN, GATA2, GPC3, GREM1 (Promoter region deletion/duplication testing only), HOXB13 (c.251G>A, p.Gly84Glu), HRAS, KIT, MAX, MEN1, MET, MITF (c.952G>A, p.Glu318Lys variant only), MLH1, MSH2, MSH3, MSH6, MUTYH, NBN, NF1, NF2, NTHL1, PALB2, PDGFRA, PHOX2B, PMS2, POLD1, POLE, POT1, PRKAR1A, PTCH1, PTEN, RAD50, RAD51C, RAD51D, RB1, RECQL4, RET, RNF43, RUNX1, SDHAF2, SDHA (sequence changes only), SDHB, SDHC, SDHD, SMAD4, SMARCA4, SMARCB1, SMARCE1, STK11, SUFU, TERC, TERT, TMEM127, TP53, TSC1, TSC2, VHL, WRN and WT1.  CHIEF COMPLIANT: Cycle 2 TCH Perjeta  INTERVAL HISTORY: Hula Tasso is a 49 y.o. with above-mentioned history of right breast cancer currently on neoadjuvant  chemotherapy with [place]. She presents to the clinic today for treatment.  She had lots of diarrhea after last cycle of chemo she took [facility] over-the-counter diarrhea medication which appears to have helped her but couple of days she had profound diarrhea.  She tells me it was small quantity and therefore she does not want to take any stronger antidiarrheal medication.  Her hair fell significantly and it is causing some burning discomfort on the scalp.  ALLERGIES:  is allergic to amoxicillin, ciprofloxacin, and penicillins.  MEDICATIONS:  Current Outpatient Medications  Medication Sig Dispense Refill  . Cholecalciferol (VITAMIN D-3 PO) Take 6,000 Units by mouth daily.     . clotrimazole-betamethasone (LOTRISONE) cream clotrimazole-betamethasone 1 %-0.05 % topical cream  APPLY TO AFFECTED AREA TWICE A DAY MORNING AND EVENING FOR 2 WEEKS    . dexamethasone (DECADRON) 4 MG tablet Take 1 tablet (4 mg total) by mouth daily. 1 tab day before chemo and 1 tab day after chemo 12 tablet 0  . hydrochlorothiazide (HYDRODIURIL) 12.5 MG tablet hydrochlorothiazide 12.5 mg tablet    . Iron-FA-B Cmp-C-Biot-Probiotic (FUSION PLUS PO) Fusion Plus 130 mg iron-1,250 mcg capsule  Take 1 capsule every day by oral route.    . lidocaine-prilocaine (EMLA) cream Apply to affected area once 30 g 3  . lisinopril (ZESTRIL) 20 MG tablet TAKE 1 TABLET DAILY 90 tablet 3  . LORazepam (ATIVAN) 0.5 MG tablet Take 1 tablet (0.5 mg total) by mouth at bedtime as needed for sleep. 30 tablet 0  . MAGNESIUM CITRATE PO Take 500 mcg by mouth. Only when having trouble sleeping    . Norethindrone-Ethinyl Estradiol-Fe Biphas (LO LOESTRIN FE) 1 MG-10 MCG / 10 MCG tablet Lo Loestrin Fe 1 mg-10 mcg (24)/10 mcg (2) tablet  Take 1 tablet every day by oral route.    . ondansetron (ZOFRAN) 8 MG tablet Take 1 tablet (8 mg total) by mouth 2 (two) times daily as needed (Nausea or vomiting). Start on the third day after chemotherapy. 30 tablet 1  .  pantoprazole (PROTONIX) 20 MG tablet Take 1 tablet (20 mg total) by mouth daily. 30 tablet 2  . prochlorperazine (COMPAZINE) 10 MG tablet Take 1 tablet (10 mg total) by mouth every 6 (six) hours as needed (Nausea or vomiting). 30 tablet 1  . SYNTHROID 75 MCG tablet TAKE 1 TABLET ON MONDAY THROUGH SUNDAY 90 tablet 3  . triamcinolone (KENALOG) 0.1 % triamcinolone acetonide 0.1 % topical cream  APPLY 2 TIMES EVERY DAY A THIN LAYER TO THE AFFECTED AREA(S)     No current facility-administered medications for this visit.    PHYSICAL EXAMINATION: ECOG PERFORMANCE STATUS: 1 - Symptomatic but completely ambulatory  Vitals:   05/07/20 0828  BP: (!) 137/94  Pulse: 87  Resp: 17  Temp: 98.1 F (36.7 C)  SpO2: 98%   Filed Weights   05/07/20 0828  Weight: 184 lb 8 oz (83.7 kg)    LABORATORY DATA:  I have reviewed the data as listed CMP Latest Ref Rng & Units 04/22/2020 04/15/2020 04/03/2020  Glucose 70 - 99 mg/dL 115(H) 106(H) 101(H)  BUN 6 - 20 mg/dL _0 Creatinine 0.44 - 1.00 mg/dL 1.18(H) 0.70 0.93  Sodium 135 - 145 mmol/L 137 139 143  Potassium 3.5 - 5.1 mmol/L 4.1 3.8 3.9  Chloride 98 - 111 mmol/L  100 105 107  CO2 22 - 32 mmol/L 32 25 28  Calcium 8.9 - 10.3 mg/dL 9.4 8.6(L) 9.3  Total Protein 6.5 - 8.1 g/dL 7.6 6.4(L) 7.9  Total Bilirubin 0.3 - 1.2 mg/dL 0.3 0.5 0.7  Alkaline Phos 38 - 126 U/L 88 67 92  AST 15 - 41 U/L _0 ALT 0 - 44 U/L _1 Lab Results  Component Value Date   WBC 14.9 (H) 05/07/2020   HGB 11.4 (L) 05/07/2020   HCT 34.1 (L) 05/07/2020   MCV 90.2 05/07/2020   PLT 477 (H) 05/07/2020   NEUTROABS 11.9 (H) 05/07/2020    ASSESSMENT & PLAN:  Malignant neoplasm of upper-outer quadrant of right breast in female, estrogen receptor negative ([place]) 03/28/2020:Patient palpated a right breast mass x2 wks. Mammogram and US showed a 2.0cm mass at the 10 o'clock position in the right breast and two enlarged right axillary lymph nodes, 2.0cm and 1.7cm.  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 Satellite lesion biopsy: DCIS ER 0%, PR 0%  Treatment plan: 1. Neoadjuvant chemotherapy with TCH Perjeta 6 cycles followed by HerceptinPerjeta versus Kadcylamaintenance for 1 year 2. Followed by breast conserving surgerywith targeted node dissection 3. Followed by adjuvant radiation therapy if patient had lumpectomy ----------------------------------------------------------------------------------------------------------------------------------------- Current treatment: Cycle 2 [place] Perjeta Echocardiogram 04/09/2020: EF 60 to 65%  Chemo toxicities: 1.  Severe fatigue 2. diarrhea: Takes Imodium.  We offered Lomotil but she did not want to take anymore medication.  I instructed her to take probiotics. 3.  History of hypothyroidism: Checking TSH today 4.  Muscle and body aches and pains   . Return to clinic in 3 weeks for cycle 3.   No orders of the defined types were placed in this encounter.  The patient has a good understanding of the overall plan. she agrees with it. she will call with any problems that may develop before the next visit here.  Total time spent: 30 mins including face to face time and time spent for planning, charting and coordination of care  Nicholas Lose, MD 05/07/2020  I, Cloyde Reams Dorshimer, am acting as scribe for Dr. Nicholas Lose.  I have reviewed the above documentation for accuracy and completeness, and I agree with the above.

## 2020-05-07 ENCOUNTER — Inpatient Hospital Stay

## 2020-05-07 ENCOUNTER — Other Ambulatory Visit: Payer: Self-pay

## 2020-05-07 ENCOUNTER — Inpatient Hospital Stay (HOSPITAL_BASED_OUTPATIENT_CLINIC_OR_DEPARTMENT_OTHER): Admitting: Hematology and Oncology

## 2020-05-07 VITALS — BP 134/87 | HR 68 | Temp 98.0°F | Resp 18

## 2020-05-07 DIAGNOSIS — C50411 Malignant neoplasm of upper-outer quadrant of right female breast: Secondary | ICD-10-CM

## 2020-05-07 DIAGNOSIS — Z5111 Encounter for antineoplastic chemotherapy: Secondary | ICD-10-CM | POA: Diagnosis not present

## 2020-05-07 DIAGNOSIS — Z95828 Presence of other vascular implants and grafts: Secondary | ICD-10-CM | POA: Insufficient documentation

## 2020-05-07 DIAGNOSIS — Z171 Estrogen receptor negative status [ER-]: Secondary | ICD-10-CM | POA: Diagnosis not present

## 2020-05-07 LAB — CMP (CANCER CENTER ONLY)
ALT: 17 U/L (ref 0–44)
AST: 18 U/L (ref 15–41)
Albumin: 3.6 g/dL (ref 3.5–5.0)
Alkaline Phosphatase: 88 U/L (ref 38–126)
Anion gap: 9 (ref 5–15)
BUN: 13 mg/dL (ref 6–20)
CO2: 24 mmol/L (ref 22–32)
Calcium: 9 mg/dL (ref 8.9–10.3)
Chloride: 106 mmol/L (ref 98–111)
Creatinine: 0.76 mg/dL (ref 0.44–1.00)
GFR, Estimated: >60 mL/min (ref >60)
Glucose, Bld: 102 mg/dL — ABNORMAL HIGH (ref 70–99)
Potassium: 3.7 mmol/L (ref 3.5–5.1)
Sodium: 139 mmol/L (ref 135–145)
Total Bilirubin: 0.4 mg/dL (ref 0.3–1.2)
Total Protein: 7.4 g/dL (ref 6.5–8.1)

## 2020-05-07 LAB — CBC WITH DIFFERENTIAL (CANCER CENTER ONLY)
Abs Immature Granulocytes: 0.07 10*3/uL (ref 0.00–0.07)
Basophils Absolute: 0.1 10*3/uL (ref 0.0–0.1)
Basophils Relative: 0 %
Eosinophils Absolute: 0 10*3/uL (ref 0.0–0.5)
Eosinophils Relative: 0 %
HCT: 34.1 % — ABNORMAL LOW (ref 36.0–46.0)
Hemoglobin: 11.4 g/dL — ABNORMAL LOW (ref 12.0–15.0)
Immature Granulocytes: 1 %
Lymphocytes Relative: 11 %
Lymphs Abs: 1.6 10*3/uL (ref 0.7–4.0)
MCH: 30.2 pg (ref 26.0–34.0)
MCHC: 33.4 g/dL (ref 30.0–36.0)
MCV: 90.2 fL (ref 80.0–100.0)
Monocytes Absolute: 1.3 10*3/uL — ABNORMAL HIGH (ref 0.1–1.0)
Monocytes Relative: 9 %
Neutro Abs: 11.9 10*3/uL — ABNORMAL HIGH (ref 1.7–7.7)
Neutrophils Relative %: 79 %
Platelet Count: 477 10*3/uL — ABNORMAL HIGH (ref 150–400)
RBC: 3.78 MIL/uL — ABNORMAL LOW (ref 3.87–5.11)
RDW: 14.1 % (ref 11.5–15.5)
WBC Count: 14.9 10*3/uL — ABNORMAL HIGH (ref 4.0–10.5)
nRBC: 0 % (ref 0.0–0.2)

## 2020-05-07 LAB — PREGNANCY, URINE: Preg Test, Ur: NEGATIVE — AB

## 2020-05-07 MED ORDER — TRASTUZUMAB-DKST CHEMO 150 MG IV SOLR
6.0000 mg/kg | Freq: Once | INTRAVENOUS | Status: AC
  Administered 2020-05-07: 483 mg via INTRAVENOUS
  Filled 2020-05-07: qty 23

## 2020-05-07 MED ORDER — ACETAMINOPHEN 325 MG PO TABS
650.0000 mg | ORAL_TABLET | Freq: Once | ORAL | Status: AC
  Administered 2020-05-07: 650 mg via ORAL

## 2020-05-07 MED ORDER — PALONOSETRON HCL INJECTION 0.25 MG/5ML
INTRAVENOUS | Status: AC
  Filled 2020-05-07: qty 5

## 2020-05-07 MED ORDER — DIPHENHYDRAMINE HCL 25 MG PO CAPS
ORAL_CAPSULE | ORAL | Status: AC
  Filled 2020-05-07: qty 1

## 2020-05-07 MED ORDER — ACETAMINOPHEN 325 MG PO TABS
ORAL_TABLET | ORAL | Status: AC
  Filled 2020-05-07: qty 2

## 2020-05-07 MED ORDER — SODIUM CHLORIDE 0.9 % IV SOLN
10.0000 mg | Freq: Once | INTRAVENOUS | Status: AC
  Administered 2020-05-07: 10 mg via INTRAVENOUS
  Filled 2020-05-07: qty 10

## 2020-05-07 MED ORDER — HEPARIN SOD (PORK) LOCK FLUSH 100 UNIT/ML IV SOLN
500.0000 [IU] | Freq: Once | INTRAVENOUS | Status: AC | PRN
  Administered 2020-05-07: 500 [IU]
  Filled 2020-05-07: qty 5

## 2020-05-07 MED ORDER — SODIUM CHLORIDE 0.9 % IV SOLN
Freq: Once | INTRAVENOUS | Status: AC
  Filled 2020-05-07: qty 250

## 2020-05-07 MED ORDER — SODIUM CHLORIDE 0.9 % IV SOLN
420.0000 mg | Freq: Once | INTRAVENOUS | Status: AC
  Administered 2020-05-07: 420 mg via INTRAVENOUS
  Filled 2020-05-07: qty 14

## 2020-05-07 MED ORDER — SODIUM CHLORIDE 0.9% FLUSH
10.0000 mL | Freq: Once | INTRAVENOUS | Status: AC
  Administered 2020-05-07: 10 mL
  Filled 2020-05-07: qty 10

## 2020-05-07 MED ORDER — SODIUM CHLORIDE 0.9% FLUSH
10.0000 mL | INTRAVENOUS | Status: DC | PRN
  Administered 2020-05-07: 10 mL
  Filled 2020-05-07: qty 10

## 2020-05-07 MED ORDER — PALONOSETRON HCL INJECTION 0.25 MG/5ML
0.2500 mg | Freq: Once | INTRAVENOUS | Status: AC
  Administered 2020-05-07: 0.25 mg via INTRAVENOUS

## 2020-05-07 MED ORDER — DIPHENHYDRAMINE HCL 25 MG PO CAPS
25.0000 mg | ORAL_CAPSULE | Freq: Once | ORAL | Status: AC
  Administered 2020-05-07: 25 mg via ORAL

## 2020-05-07 MED ORDER — SODIUM CHLORIDE 0.9 % IV SOLN
150.0000 mg | Freq: Once | INTRAVENOUS | Status: AC
  Administered 2020-05-07: 150 mg via INTRAVENOUS
  Filled 2020-05-07: qty 150

## 2020-05-07 MED ORDER — SODIUM CHLORIDE 0.9 % IV SOLN
786.0000 mg | Freq: Once | INTRAVENOUS | Status: AC
  Administered 2020-05-07: 790 mg via INTRAVENOUS
  Filled 2020-05-07: qty 79

## 2020-05-07 MED ORDER — SODIUM CHLORIDE 0.9 % IV SOLN
75.0000 mg/m2 | Freq: Once | INTRAVENOUS | Status: AC
  Administered 2020-05-07: 150 mg via INTRAVENOUS
  Filled 2020-05-07: qty 15

## 2020-05-07 NOTE — Patient Instructions (Signed)
[facility] Discharge Instructions for Patients Receiving Chemotherapy  Today you received the following chemotherapy agents trastuzumab; pertuzumab; docetaxel; carboplatin  To help prevent nausea and vomiting after your treatment, we encourage you to take your nausea medication as directed   If you develop nausea and vomiting that is not controlled by your nausea medication, call the clinic.   BELOW ARE SYMPTOMS THAT SHOULD BE REPORTED IMMEDIATELY:  *FEVER GREATER THAN 100.5 F  *CHILLS WITH OR WITHOUT FEVER  NAUSEA AND VOMITING THAT IS NOT CONTROLLED WITH YOUR NAUSEA MEDICATION  *UNUSUAL SHORTNESS OF BREATH  *UNUSUAL BRUISING OR BLEEDING  TENDERNESS IN MOUTH AND THROAT WITH OR WITHOUT PRESENCE OF ULCERS  *URINARY PROBLEMS  *BOWEL PROBLEMS  UNUSUAL RASH Items with * indicate a potential emergency and should be followed up as soon as possible.  Feel free to call the clinic should you have any questions or concerns. The clinic phone number is [phone].  Please show the [place] at check-in to the Emergency Department and triage nurse.

## 2020-05-07 NOTE — Progress Notes (Signed)
Nutrition Assessment ° ° °Reason for Assessment: ° °Referral from Michelle, Social Worker ° ° °ASSESSMENT:  °49 year old female with right breast cancer.  Past medical history of HTN, Fe deficiency anemia, Vit D deficiency. °Patient receiving neoadjuvant chemotherapy. ° °Met with patient during infusion.  Patient reports that she has not had any issues with nausea.  Did have issues with diarrhea.  Patient wanting to increase her protein without eating much meat.  Drinks mostly almond milk or oat milk.  Likes premier protein shakes and drinking BID. ° °Prior to treatment walking about 5 miles per day.  Less since starting treatment ° ° °Medications:  °Vit D, zofran, protonix, ativan, decadron ° ° °Labs: reviewed ° ° °Anthropometrics:  ° °Height: 68 inches °Weight: 184 lb today increased from 178 lb on 11/19 °BMI: 28 ° ° °Estimated Energy Needs ° °Kcals: 1800-2100 °Protein: 90-105 g °Fluid: > 1.8 L ° ° °NUTRITION DIAGNOSIS: Food and nutrition related knowledge deficit related to new diagnosis of breast cancer as evidenced by patient with questions regarding proper diet for cancer ° ° °INTERVENTION:  °Discussed food strategies to help with diarrhea.  Patient had Eating Hints book with her and we reviewed the pages together that discussed diarrhea. °Discussed foods that contain protein other than meat.  Provided patient with daily protein goals. °Patient to continue premier protein shake for added nutrition °Contact information provided ° ° °MONITORING, EVALUATION, GOAL: weight trends, intake ° ° °Next Visit: as needed ° °Joli B. Allen, RD, LDN °Registered Dietitian [phone] (mobile) ° ° ° ° ° ° ° °

## 2020-05-08 LAB — THYROID PANEL WITH TSH
Free Thyroxine Index: 2.2 (ref 1.2–4.9)
T3 Uptake Ratio: 28 % (ref 24–39)
T4, Total: 7.8 ug/dL (ref 4.5–12.0)
TSH: 1.07 u[IU]/mL (ref 0.450–4.500)

## 2020-05-09 ENCOUNTER — Inpatient Hospital Stay

## 2020-05-09 ENCOUNTER — Telehealth: Payer: Self-pay | Admitting: Hematology and Oncology

## 2020-05-09 ENCOUNTER — Other Ambulatory Visit: Payer: Self-pay

## 2020-05-09 VITALS — BP 148/93 | HR 85 | Resp 18

## 2020-05-09 DIAGNOSIS — C50411 Malignant neoplasm of upper-outer quadrant of right female breast: Secondary | ICD-10-CM

## 2020-05-09 DIAGNOSIS — Z5111 Encounter for antineoplastic chemotherapy: Secondary | ICD-10-CM | POA: Diagnosis not present

## 2020-05-09 MED ORDER — PEGFILGRASTIM-JMDB 6 MG/0.6ML ~~LOC~~ SOSY
PREFILLED_SYRINGE | SUBCUTANEOUS | Status: AC
  Filled 2020-05-09: qty 0.6

## 2020-05-09 MED ORDER — PEGFILGRASTIM-JMDB 6 MG/0.6ML ~~LOC~~ SOSY
6.0000 mg | PREFILLED_SYRINGE | Freq: Once | SUBCUTANEOUS | Status: AC
  Administered 2020-05-09: 6 mg via SUBCUTANEOUS

## 2020-05-09 NOTE — Telephone Encounter (Signed)
No 12/21 lost, no changes made to pt schedule

## 2020-05-27 ENCOUNTER — Encounter: Payer: Self-pay | Admitting: Internal Medicine

## 2020-05-27 NOTE — Progress Notes (Signed)
Patient Care Team: [name], MD as PCP - General (Internal Medicine) Rockwell Germany, RN as Oncology Nurse Navigator Mauro Kaufmann, RN as Oncology Nurse Navigator Coralie Keens, MD as Consulting Physician (General Surgery) Nicholas Lose, MD as Consulting Physician (Hematology and Oncology) [name], MD as Consulting Physician (Radiation Oncology)  DIAGNOSIS:    ICD-10-CM   1. Malignant neoplasm of upper-outer quadrant of right breast in female, estrogen receptor negative ([place])  C50.411    Z17.1     SUMMARY OF ONCOLOGIC HISTORY: Oncology History  Malignant neoplasm of upper-outer quadrant of right breast in female, estrogen receptor negative ([place])  03/28/2020 Initial Diagnosis   Patient palpated a right breast mass x2 wks. Mammogram and US showed a 2.0cm mass at the 10 o'clock position in the right breast and two enlarged right axillary lymph nodes, 2.0cm and 1.7cm. 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   04/16/2020 -  Chemotherapy   The patient had dexamethasone (DECADRON) 4 MG tablet, 4 mg (100 % of original dose 4 mg), Oral, Daily, 1 of 1 cycle, Start date: 04/09/2020, End date: -- Dose modification: 4 mg (original dose 4 mg, Cycle 0) palonosetron (ALOXI) injection 0.25 mg, 0.25 mg, Intravenous,  Once, 2 of 6 cycles Administration: 0.25 mg (04/16/2020), 0.25 mg (05/07/2020) pegfilgrastim-jmdb (FULPHILA) injection 6 mg, 6 mg, Subcutaneous,  Once, 2 of 6 cycles Administration: 6 mg (04/18/2020), 6 mg (05/09/2020) CARBOplatin (PARAPLATIN) 790 mg in sodium chloride 0.9 % 250 mL chemo infusion, 790 mg (112.7 % of original dose 697.2 mg), Intravenous,  Once, 2 of 6 cycles Dose modification:   (original dose 697.2 mg, Cycle 1) Administration: 790 mg (04/16/2020), 790 mg (05/07/2020) DOCEtaxel (TAXOTERE) 150 mg in sodium chloride 0.9 % 250 mL chemo infusion, 75 mg/m2 = 150 mg, Intravenous,  Once, 2 of 6  cycles Administration: 150 mg (04/16/2020), 150 mg (05/07/2020) fosaprepitant (EMEND) 150 mg in sodium chloride 0.9 % 145 mL IVPB, 150 mg, Intravenous,  Once, 2 of 6 cycles Administration: 150 mg (04/16/2020), 150 mg (05/07/2020) pertuzumab (PERJETA) 420 mg in sodium chloride 0.9 % 250 mL chemo infusion, 420 mg (100 % of original dose 420 mg), Intravenous, Once, 2 of 6 cycles Dose modification: 420 mg (original dose 420 mg, Cycle 1, Reason: Provider Judgment) Administration: 420 mg (04/16/2020), 420 mg (05/07/2020) trastuzumab-dkst (OGIVRI) 651 mg in sodium chloride 0.9 % 250 mL chemo infusion, 8 mg/kg = 651 mg, Intravenous,  Once, 2 of 6 cycles Administration: 651 mg (04/16/2020), 483 mg (05/07/2020)  for chemotherapy treatment.    04/18/2020 Genetic Testing   Negative genetic testing: no pathogenic variants detected in [facility] Multi-Cancer Panel.  Variants of uncertain significance detected in CDH1 at c.1289T>G (p.Val430Gly) and EGFR at c.2873G>A (p.Arg958His).  The report date is April 18, 2020.   The Multi-Cancer Panel offered by [facility] includes sequencing and/or deletion duplication testing of the following 85 genes: AIP, ALK, APC, ATM, AXIN2,BAP1,  BARD1, BLM, BMPR1A, BRCA1, BRCA2, BRIP1, CASR, CDC73, CDH1, CDK4, CDKN1B, CDKN1C, CDKN2A (p14ARF), CDKN2A (p16INK4a), CEBPA, CHEK2, CTNNA1, DICER1, DIS3L2, EGFR, EPCAM (Deletion/duplication testing only), FH, FLCN, GATA2, GPC3, GREM1 (Promoter region deletion/duplication testing only), HOXB13 (c.251G>A, p.Gly84Glu), HRAS, KIT, MAX, MEN1, MET, MITF (c.952G>A, p.Glu318Lys variant only), MLH1, MSH2, MSH3, MSH6, MUTYH, NBN, NF1, NF2, NTHL1, PALB2, PDGFRA, PHOX2B, PMS2, POLD1, POLE, POT1, PRKAR1A, PTCH1, PTEN, RAD50, RAD51C, RAD51D, RB1, RECQL4, RET, RNF43, RUNX1, SDHAF2, SDHA (sequence changes only), SDHB, SDHC, SDHD,  SMAD4, SMARCA4, SMARCB1, SMARCE1, STK11, SUFU, TERC, TERT, TMEM127, TP53, TSC1, TSC2, VHL, WRN and WT1.      CHIEF COMPLIANT: Cycle  3TCH Perjeta  INTERVAL HISTORY: Shannon Kane is a 50 y.o. with above-mentioned history of right breast cancer currently on neoadjuvant chemotherapy with [place]. She presents to the clinic today for treatment.    She reports that the diarrhea is persistent every day 3-4 loose stools per day.  She has noticed some cramping in the legs.  She feels tired for more number of days.  However the bone pain has subsided.  Denies any nausea or vomiting.  ALLERGIES:  is allergic to amoxicillin, ciprofloxacin, and penicillins.  MEDICATIONS:  Current Outpatient Medications  Medication Sig Dispense Refill   Cholecalciferol (VITAMIN D-3 PO) Take 6,000 Units by mouth daily.      clotrimazole-betamethasone (LOTRISONE) cream clotrimazole-betamethasone 1 %-0.05 % topical cream  APPLY TO AFFECTED AREA TWICE A DAY MORNING AND EVENING FOR 2 WEEKS     dexamethasone (DECADRON) 4 MG tablet Take 1 tablet (4 mg total) by mouth daily. 1 tab day before chemo and 1 tab day after chemo 12 tablet 0   lidocaine-prilocaine (EMLA) cream Apply to affected area once 30 g 3   lisinopril (ZESTRIL) 20 MG tablet TAKE 1 TABLET DAILY 90 tablet 3   LORazepam (ATIVAN) 0.5 MG tablet Take 1 tablet (0.5 mg total) by mouth at bedtime as needed for sleep. 30 tablet 0   MAGNESIUM CITRATE PO Take 500 mcg by mouth. Only when having trouble sleeping     Norethindrone-Ethinyl Estradiol-Fe Biphas (LO LOESTRIN FE) 1 MG-10 MCG / 10 MCG tablet Lo Loestrin Fe 1 mg-10 mcg (24)/10 mcg (2) tablet  Take 1 tablet every day by oral route.     ondansetron (ZOFRAN) 8 MG tablet Take 1 tablet (8 mg total) by mouth 2 (two) times daily as needed (Nausea or vomiting). Start on the third day after chemotherapy. 30 tablet 1   pantoprazole (PROTONIX) 20 MG tablet Take 1 tablet (20 mg total) by mouth daily. 30 tablet 2   prochlorperazine (COMPAZINE) 10 MG tablet Take 1 tablet (10 mg total) by mouth every 6 (six) hours as needed (Nausea or vomiting). 30  tablet 1   SYNTHROID 75 MCG tablet TAKE 1 TABLET ON MONDAY THROUGH SUNDAY 90 tablet 3   triamcinolone (KENALOG) 0.1 % triamcinolone acetonide 0.1 % topical cream  APPLY 2 TIMES EVERY DAY A THIN LAYER TO THE AFFECTED AREA(S)     No current facility-administered medications for this visit.    PHYSICAL EXAMINATION: ECOG PERFORMANCE STATUS: 1 - Symptomatic but completely ambulatory  There were no vitals filed for this visit. There were no vitals filed for this visit.   LABORATORY DATA:  I have reviewed the data as listed CMP Latest Ref Rng & Units 05/07/2020 04/22/2020 04/15/2020  Glucose 70 - 99 mg/dL 102(H) 115(H) 106(H)  BUN 6 - 20 mg/dL 13 15 8   Creatinine 0.44 - 1.00 mg/dL 0.76 1.18(H) 0.70  Sodium 135 - 145 mmol/L 139 137 139  Potassium 3.5 - 5.1 mmol/L 3.7 4.1 3.8  Chloride 98 - 111 mmol/L 106 100 105  CO2 22 - 32 mmol/L 24 32 25  Calcium 8.9 - 10.3 mg/dL 9.0 9.4 8.6(L)  Total Protein 6.5 - 8.1 g/dL 7.4 7.6 6.4(L)  Total Bilirubin 0.3 - 1.2 mg/dL 0.4 0.3 0.5  Alkaline Phos 38 - 126 U/L 88 88 67  AST 15 - 41 U/L 18 19 21  ALT 0 - 44 U/L 17 19 15     Lab Results  Component Value Date   WBC 11.4 (H) 05/28/2020   HGB 11.3 (L) 05/28/2020   HCT 33.9 (L) 05/28/2020   MCV 93.6 05/28/2020   PLT 276 05/28/2020   NEUTROABS 10.3 (H) 05/28/2020    ASSESSMENT & PLAN:  Malignant neoplasm of upper-outer quadrant of right breast in female, estrogen receptor negative ([place]) 03/28/2020:Patient palpated a right breast mass x2 wks. Mammogram and US showed a 2.0cm mass at the 10 o'clock position in the right breast and two enlarged right axillary lymph nodes, 2.0cm and 1.7cm. 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 Satellite lesion biopsy: DCIS ER 0%, PR 0%  Treatment plan: 1. Neoadjuvant chemotherapy with TCH Perjeta 6 cycles followed by HerceptinPerjeta versus Kadcylamaintenance for 1 year 2. Followed by breast  conserving surgerywith targeted node dissection 3. Followed by adjuvant radiation therapy if patient had lumpectomy ----------------------------------------------------------------------------------------------------------------------------------------- Current treatment: Cycle 3TCH Perjeta Echocardiogram 04/09/2020: EF 60 to 65%  Chemo toxicities: 1.Severe fatigue 2.diarrhea: Takes Imodium and probiotics.  I added Lomotil today. 3.History of hypothyroidism:  TSH 1.07 4.Muscle and body aches and pains: Much better with this round of treatment 5. chemo induced anemia: Monitoring closely today's hemoglobin 11.3  Return to clinic in 3 weeks for cycle 4.   No orders of the defined types were placed in this encounter.  The patient has a good understanding of the overall plan. she agrees with it. she will call with any problems that may develop before the next visit here.  Total time spent: 30 mins including face to face time and time spent for planning, charting and coordination of care  Nicholas Lose, MD 05/28/2020  I, Cloyde Reams Dorshimer, am acting as scribe for Dr. Nicholas Lose.  I have reviewed the above documentation for accuracy and completeness, and I agree with the above.

## 2020-05-28 ENCOUNTER — Inpatient Hospital Stay

## 2020-05-28 ENCOUNTER — Other Ambulatory Visit: Payer: Self-pay

## 2020-05-28 ENCOUNTER — Inpatient Hospital Stay (HOSPITAL_BASED_OUTPATIENT_CLINIC_OR_DEPARTMENT_OTHER): Admitting: Hematology and Oncology

## 2020-05-28 ENCOUNTER — Inpatient Hospital Stay: Attending: Hematology and Oncology

## 2020-05-28 ENCOUNTER — Encounter: Payer: Self-pay | Admitting: *Deleted

## 2020-05-28 DIAGNOSIS — M791 Myalgia, unspecified site: Secondary | ICD-10-CM | POA: Diagnosis not present

## 2020-05-28 DIAGNOSIS — C50411 Malignant neoplasm of upper-outer quadrant of right female breast: Secondary | ICD-10-CM | POA: Diagnosis present

## 2020-05-28 DIAGNOSIS — E039 Hypothyroidism, unspecified: Secondary | ICD-10-CM | POA: Insufficient documentation

## 2020-05-28 DIAGNOSIS — Z171 Estrogen receptor negative status [ER-]: Secondary | ICD-10-CM | POA: Diagnosis not present

## 2020-05-28 DIAGNOSIS — Z5111 Encounter for antineoplastic chemotherapy: Secondary | ICD-10-CM | POA: Diagnosis not present

## 2020-05-28 DIAGNOSIS — Z95828 Presence of other vascular implants and grafts: Secondary | ICD-10-CM

## 2020-05-28 DIAGNOSIS — R197 Diarrhea, unspecified: Secondary | ICD-10-CM | POA: Diagnosis not present

## 2020-05-28 DIAGNOSIS — Z5189 Encounter for other specified aftercare: Secondary | ICD-10-CM | POA: Diagnosis not present

## 2020-05-28 DIAGNOSIS — Z5112 Encounter for antineoplastic immunotherapy: Secondary | ICD-10-CM | POA: Diagnosis present

## 2020-05-28 LAB — CBC WITH DIFFERENTIAL (CANCER CENTER ONLY)
Abs Immature Granulocytes: 0.08 10*3/uL — ABNORMAL HIGH (ref 0.00–0.07)
Basophils Absolute: 0.1 10*3/uL (ref 0.0–0.1)
Basophils Relative: 1 %
Eosinophils Absolute: 0 10*3/uL (ref 0.0–0.5)
Eosinophils Relative: 0 %
HCT: 33.9 % — ABNORMAL LOW (ref 36.0–46.0)
Hemoglobin: 11.3 g/dL — ABNORMAL LOW (ref 12.0–15.0)
Immature Granulocytes: 1 %
Lymphocytes Relative: 6 %
Lymphs Abs: 0.7 10*3/uL (ref 0.7–4.0)
MCH: 31.2 pg (ref 26.0–34.0)
MCHC: 33.3 g/dL (ref 30.0–36.0)
MCV: 93.6 fL (ref 80.0–100.0)
Monocytes Absolute: 0.3 10*3/uL (ref 0.1–1.0)
Monocytes Relative: 3 %
Neutro Abs: 10.3 10*3/uL — ABNORMAL HIGH (ref 1.7–7.7)
Neutrophils Relative %: 89 %
Platelet Count: 276 10*3/uL (ref 150–400)
RBC: 3.62 MIL/uL — ABNORMAL LOW (ref 3.87–5.11)
RDW: 17.1 % — ABNORMAL HIGH (ref 11.5–15.5)
WBC Count: 11.4 10*3/uL — ABNORMAL HIGH (ref 4.0–10.5)
nRBC: 0 % (ref 0.0–0.2)

## 2020-05-28 LAB — CMP (CANCER CENTER ONLY)
ALT: 26 U/L (ref 0–44)
AST: 28 U/L (ref 15–41)
Albumin: 3.8 g/dL (ref 3.5–5.0)
Alkaline Phosphatase: 83 U/L (ref 38–126)
Anion gap: 9 (ref 5–15)
BUN: 11 mg/dL (ref 6–20)
CO2: 25 mmol/L (ref 22–32)
Calcium: 9.4 mg/dL (ref 8.9–10.3)
Chloride: 108 mmol/L (ref 98–111)
Creatinine: 0.78 mg/dL (ref 0.44–1.00)
GFR, Estimated: >60 mL/min (ref >60)
Glucose, Bld: 108 mg/dL — ABNORMAL HIGH (ref 70–99)
Potassium: 3.7 mmol/L (ref 3.5–5.1)
Sodium: 142 mmol/L (ref 135–145)
Total Bilirubin: 0.6 mg/dL (ref 0.3–1.2)
Total Protein: 7 g/dL (ref 6.5–8.1)

## 2020-05-28 LAB — PREGNANCY, URINE: Preg Test, Ur: NEGATIVE

## 2020-05-28 MED ORDER — SODIUM CHLORIDE 0.9 % IV SOLN
420.0000 mg | Freq: Once | INTRAVENOUS | Status: AC
  Administered 2020-05-28: 420 mg via INTRAVENOUS
  Filled 2020-05-28: qty 14

## 2020-05-28 MED ORDER — DIPHENHYDRAMINE HCL 25 MG PO CAPS
25.0000 mg | ORAL_CAPSULE | Freq: Once | ORAL | Status: AC
  Administered 2020-05-28: 25 mg via ORAL

## 2020-05-28 MED ORDER — SODIUM CHLORIDE 0.9% FLUSH
10.0000 mL | INTRAVENOUS | Status: DC | PRN
  Administered 2020-05-28: 10 mL
  Filled 2020-05-28: qty 10

## 2020-05-28 MED ORDER — SODIUM CHLORIDE 0.9 % IV SOLN
Freq: Once | INTRAVENOUS | Status: AC
  Filled 2020-05-28: qty 250

## 2020-05-28 MED ORDER — SODIUM CHLORIDE 0.9 % IV SOLN
75.0000 mg/m2 | Freq: Once | INTRAVENOUS | Status: AC
  Administered 2020-05-28: 150 mg via INTRAVENOUS
  Filled 2020-05-28: qty 15

## 2020-05-28 MED ORDER — PALONOSETRON HCL INJECTION 0.25 MG/5ML
INTRAVENOUS | Status: AC
  Filled 2020-05-28: qty 5

## 2020-05-28 MED ORDER — DIPHENHYDRAMINE HCL 25 MG PO CAPS
ORAL_CAPSULE | ORAL | Status: AC
  Filled 2020-05-28: qty 1

## 2020-05-28 MED ORDER — SODIUM CHLORIDE 0.9% FLUSH
10.0000 mL | Freq: Once | INTRAVENOUS | Status: AC
  Administered 2020-05-28: 10 mL
  Filled 2020-05-28: qty 10

## 2020-05-28 MED ORDER — ACETAMINOPHEN 325 MG PO TABS
650.0000 mg | ORAL_TABLET | Freq: Once | ORAL | Status: AC
  Administered 2020-05-28: 650 mg via ORAL

## 2020-05-28 MED ORDER — PALONOSETRON HCL INJECTION 0.25 MG/5ML
0.2500 mg | Freq: Once | INTRAVENOUS | Status: AC
  Administered 2020-05-28: 0.25 mg via INTRAVENOUS

## 2020-05-28 MED ORDER — CARBOPLATIN CHEMO INJECTION 600 MG/60ML
790.0000 mg | Freq: Once | INTRAVENOUS | Status: AC
  Administered 2020-05-28: 790 mg via INTRAVENOUS
  Filled 2020-05-28: qty 79

## 2020-05-28 MED ORDER — TRASTUZUMAB-DKST CHEMO 150 MG IV SOLR
6.0000 mg/kg | Freq: Once | INTRAVENOUS | Status: AC
  Administered 2020-05-28: 483 mg via INTRAVENOUS
  Filled 2020-05-28: qty 23

## 2020-05-28 MED ORDER — DIPHENOXYLATE-ATROPINE 2.5-0.025 MG PO TABS: 1.0000 | ORAL_TABLET | Freq: Four times a day (QID) | ORAL | 2 refills | Status: DC | PRN

## 2020-05-28 MED ORDER — FOSAPREPITANT DIMEGLUMINE INJECTION 150 MG
150.0000 mg | Freq: Once | INTRAVENOUS | Status: AC
  Administered 2020-05-28: 150 mg via INTRAVENOUS
  Filled 2020-05-28: qty 150

## 2020-05-28 MED ORDER — HEPARIN SOD (PORK) LOCK FLUSH 100 UNIT/ML IV SOLN
500.0000 [IU] | Freq: Once | INTRAVENOUS | Status: AC | PRN
  Administered 2020-05-28: 500 [IU]
  Filled 2020-05-28: qty 5

## 2020-05-28 MED ORDER — SODIUM CHLORIDE 0.9 % IV SOLN
10.0000 mg | Freq: Once | INTRAVENOUS | Status: AC
  Administered 2020-05-28: 10 mg via INTRAVENOUS
  Filled 2020-05-28: qty 10

## 2020-05-28 MED ORDER — ACETAMINOPHEN 325 MG PO TABS
ORAL_TABLET | ORAL | Status: AC
  Filled 2020-05-28: qty 2

## 2020-05-28 NOTE — Assessment & Plan Note (Signed)
03/28/2020:Patient palpated a right breast mass x2 wks. Mammogram and US showed a 2.0cm mass at the 10 o'clock position in the right breast and two enlarged right axillary lymph nodes, 2.0cm and 1.7cm. 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 Satellite lesion biopsy: DCIS ER 0%, PR 0%  Treatment plan: 1. Neoadjuvant chemotherapy with TCH Perjeta 6 cycles followed by HerceptinPerjeta versus Kadcylamaintenance for 1 year 2. Followed by breast conserving surgerywith targeted node dissection 3. Followed by adjuvant radiation therapy if patient had lumpectomy ----------------------------------------------------------------------------------------------------------------------------------------- Current treatment: Cycle 3TCH Perjeta Echocardiogram 04/09/2020: EF 60 to 65%  Chemo toxicities: 1.Severe fatigue 2.diarrhea: Takes Imodium.  We offered Lomotil but she did not want to take anymore medication.  I instructed her to take probiotics. 3.History of hypothyroidism: Checking TSH today 4.Muscle and body aches and pains   . Return to clinic in 3 weeks for cycle 4.

## 2020-05-28 NOTE — Patient Instructions (Signed)
[facility] Discharge Instructions for Patients Receiving Chemotherapy  Today you received the following chemotherapy agents: trastuzumab/pertuzumab/docetaxel/carboplatin.  To help prevent nausea and vomiting after your treatment, we encourage you to take your nausea medication as directed.   If you develop nausea and vomiting that is not controlled by your nausea medication, call the clinic.   BELOW ARE SYMPTOMS THAT SHOULD BE REPORTED IMMEDIATELY:  *FEVER GREATER THAN 100.5 F  *CHILLS WITH OR WITHOUT FEVER  NAUSEA AND VOMITING THAT IS NOT CONTROLLED WITH YOUR NAUSEA MEDICATION  *UNUSUAL SHORTNESS OF BREATH  *UNUSUAL BRUISING OR BLEEDING  TENDERNESS IN MOUTH AND THROAT WITH OR WITHOUT PRESENCE OF ULCERS  *URINARY PROBLEMS  *BOWEL PROBLEMS  UNUSUAL RASH Items with * indicate a potential emergency and should be followed up as soon as possible.  Feel free to call the clinic should you have any questions or concerns. The clinic phone number is [phone].  Please show the CHEMO ALERT CARD at check-in to the Emergency Department and triage nurse.  

## 2020-05-29 LAB — THYROID PANEL WITH TSH
Free Thyroxine Index: 1.8 (ref 1.2–4.9)
T3 Uptake Ratio: 24 % (ref 24–39)
T4, Total: 7.7 ug/dL (ref 4.5–12.0)
TSH: 2.33 u[IU]/mL (ref 0.450–4.500)

## 2020-05-30 ENCOUNTER — Other Ambulatory Visit: Payer: Self-pay

## 2020-05-30 ENCOUNTER — Inpatient Hospital Stay

## 2020-05-30 ENCOUNTER — Ambulatory Visit

## 2020-05-30 VITALS — BP 143/97 | HR 76 | Temp 98.4°F | Resp 18

## 2020-05-30 DIAGNOSIS — Z171 Estrogen receptor negative status [ER-]: Secondary | ICD-10-CM

## 2020-05-30 DIAGNOSIS — C50411 Malignant neoplasm of upper-outer quadrant of right female breast: Secondary | ICD-10-CM

## 2020-05-30 DIAGNOSIS — Z5111 Encounter for antineoplastic chemotherapy: Secondary | ICD-10-CM | POA: Diagnosis not present

## 2020-05-30 MED ORDER — PEGFILGRASTIM-JMDB 6 MG/0.6ML ~~LOC~~ SOSY
6.0000 mg | PREFILLED_SYRINGE | Freq: Once | SUBCUTANEOUS | Status: AC
  Administered 2020-05-30: 6 mg via SUBCUTANEOUS

## 2020-05-30 NOTE — Patient Instructions (Signed)
Pegfilgrastim injection What is this medicine? PEGFILGRASTIM (PEG fil gra stim) is a long-acting granulocyte colony-stimulating factor that stimulates the growth of neutrophils, a type of white blood cell important in the body's fight against infection. It is used to reduce the incidence of fever and infection in patients with certain types of cancer who are receiving chemotherapy that affects the bone marrow, and to increase survival after being exposed to high doses of radiation. This medicine may be used for other purposes; ask your health care provider or pharmacist if you have questions. COMMON BRAND NAME(S): Fulphila, Neulasta, Nyvepria, UDENYCA, Ziextenzo What should I tell my health care provider before I take this medicine? They need to know if you have any of these conditions:  kidney disease  latex allergy  ongoing radiation therapy  sickle cell disease  skin reactions to acrylic adhesives (On-Body Injector only)  an unusual or allergic reaction to pegfilgrastim, filgrastim, other medicines, foods, dyes, or preservatives  pregnant or trying to get pregnant  breast-feeding How should I use this medicine? This medicine is for injection under the skin. If you get this medicine at home, you will be taught how to prepare and give the pre-filled syringe or how to use the On-body Injector. Refer to the patient Instructions for Use for detailed instructions. Use exactly as directed. Tell your healthcare provider immediately if you suspect that the On-body Injector may not have performed as intended or if you suspect the use of the On-body Injector resulted in a missed or partial dose. It is important that you put your used needles and syringes in a special sharps container. Do not put them in a trash can. If you do not have a sharps container, call your pharmacist or healthcare provider to get one. Talk to your pediatrician regarding the use of this medicine in children. While this drug  may be prescribed for selected conditions, precautions do apply. Overdosage: If you think you have taken too much of this medicine contact a poison control center or emergency room at once. NOTE: This medicine is only for you. Do not share this medicine with others. What if I miss a dose? It is important not to miss your dose. Call your doctor or health care professional if you miss your dose. If you miss a dose due to an On-body Injector failure or leakage, a new dose should be administered as soon as possible using a single prefilled syringe for manual use. What may interact with this medicine? Interactions have not been studied. This list may not describe all possible interactions. Give your health care provider a list of all the medicines, herbs, non-prescription drugs, or dietary supplements you use. Also tell them if you smoke, drink alcohol, or use illegal drugs. Some items may interact with your medicine. What should I watch for while using this medicine? Your condition will be monitored carefully while you are receiving this medicine. You may need blood work done while you are taking this medicine. Talk to your health care provider about your risk of cancer. You may be more at risk for certain types of cancer if you take this medicine. If you are going to need a MRI, CT scan, or other procedure, tell your doctor that you are using this medicine (On-Body Injector only). What side effects may I notice from receiving this medicine? Side effects that you should report to your doctor or health care professional as soon as possible:  allergic reactions (skin rash, itching or hives, swelling of   the face, lips, or tongue)  back pain  dizziness  fever  pain, redness, or irritation at site where injected  pinpoint red spots on the skin  red or dark-brown urine  shortness of breath or breathing problems  stomach or side pain, or pain at the shoulder  swelling  tiredness  trouble  passing urine or change in the amount of urine  unusual bruising or bleeding Side effects that usually do not require medical attention (report to your doctor or health care professional if they continue or are bothersome):  bone pain  muscle pain This list may not describe all possible side effects. Call your doctor for medical advice about side effects. You may report side effects to FDA at 1-800-FDA-1088. Where should I keep my medicine? Keep out of the reach of children. If you are using this medicine at home, you will be instructed on how to store it. Throw away any unused medicine after the expiration date on the label. NOTE: This sheet is a summary. It may not cover all possible information. If you have questions about this medicine, talk to your doctor, pharmacist, or health care provider.  2021 Elsevier/Gold Standard (2019-05-26 13:20:51)  

## 2020-06-17 NOTE — Assessment & Plan Note (Signed)
03/28/2020:Patient palpated a right breast mass x2 wks. Mammogram and US showed a 2.0cm mass at the 10 o'clock position in the right breast and two enlarged right axillary lymph nodes, 2.0cm and 1.7cm. 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 Satellite lesion biopsy: DCIS ER 0%, PR 0%  Treatment plan: 1. Neoadjuvant chemotherapy with TCH Perjeta 6 cycles followed by HerceptinPerjeta versus Kadcylamaintenance for 1 year 2. Followed by breast conserving surgerywith targeted node dissection 3. Followed by adjuvant radiation therapy if patient had lumpectomy ----------------------------------------------------------------------------------------------------------------------------------------- Current treatment: Cycle4TCH Perjeta Echocardiogram 04/09/2020: EF 60 to 65%  Chemo toxicities: 1.Severe fatigue 2.diarrhea: Takes Imodium and probiotics.  I added Lomotil today. 3.History of hypothyroidism: TSH 1.07 4.Muscle and body aches and pains: Much better with this round of treatment 5. chemo induced anemia: Monitoring closely today's hemoglobin 11.3  Return to clinic in3weeks for cycle 5.

## 2020-06-17 NOTE — Progress Notes (Signed)
Patient Care Team: [name], MD as PCP - General (Internal Medicine) Rockwell Germany, RN as Oncology Nurse Navigator Mauro Kaufmann, RN as Oncology Nurse Navigator Coralie Keens, MD as Consulting Physician (General Surgery) Nicholas Lose, MD as Consulting Physician (Hematology and Oncology) [name], MD as Consulting Physician (Radiation Oncology)  DIAGNOSIS:    ICD-10-CM   1. Malignant neoplasm of upper-outer quadrant of right breast in female, estrogen receptor negative ([place])  C50.411    Z17.1     SUMMARY OF ONCOLOGIC HISTORY: Oncology History  Malignant neoplasm of upper-outer quadrant of right breast in female, estrogen receptor negative ([place])  03/28/2020 Initial Diagnosis   Patient palpated a right breast mass x2 wks. Mammogram and US showed a 2.0cm mass at the 10 o'clock position in the right breast and two enlarged right axillary lymph nodes, 2.0cm and 1.7cm. 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   04/16/2020 -  Chemotherapy    Patient is on Treatment Plan: BREAST  DOCETAXEL + CARBOPLATIN + TRASTUZUMAB + PERTUZUMAB  (TCHP) Q21D       04/18/2020 Genetic Testing   Negative genetic testing: no pathogenic variants detected in [facility] Multi-Cancer Panel.  Variants of uncertain significance detected in CDH1 at c.1289T>G (p.Val430Gly) and EGFR at c.2873G>A (p.Arg958His).  The report date is April 18, 2020.   The Multi-Cancer Panel offered by [facility] includes sequencing and/or deletion duplication testing of the following 85 genes: AIP, ALK, APC, ATM, AXIN2,BAP1,  BARD1, BLM, BMPR1A, BRCA1, BRCA2, BRIP1, CASR, CDC73, CDH1, CDK4, CDKN1B, CDKN1C, CDKN2A (p14ARF), CDKN2A (p16INK4a), CEBPA, CHEK2, CTNNA1, DICER1, DIS3L2, EGFR, EPCAM (Deletion/duplication testing only), FH, FLCN, GATA2, GPC3, GREM1 (Promoter region deletion/duplication testing only), HOXB13 (c.251G>A, p.Gly84Glu), HRAS, KIT, MAX, MEN1,  MET, MITF (c.952G>A, p.Glu318Lys variant only), MLH1, MSH2, MSH3, MSH6, MUTYH, NBN, NF1, NF2, NTHL1, PALB2, PDGFRA, PHOX2B, PMS2, POLD1, POLE, POT1, PRKAR1A, PTCH1, PTEN, RAD50, RAD51C, RAD51D, RB1, RECQL4, RET, RNF43, RUNX1, SDHAF2, SDHA (sequence changes only), SDHB, SDHC, SDHD, SMAD4, SMARCA4, SMARCB1, SMARCE1, STK11, SUFU, TERC, TERT, TMEM127, TP53, TSC1, TSC2, VHL, WRN and WT1.      CHIEF COMPLIANT: Cycle4TCH Perjeta  INTERVAL HISTORY: Shannon Kane is a 50 y.o. with above-mentioned history of right breast cancer currently on neoadjuvant chemotherapy with [place].She presents to the clinic todayfor treatment.  Her major complaints from chemotherapy are   diarrhea for which she is now taking Lomotil which is helping.  Diffuse body aches and pains have stabilized.  Denies neuropathy.  Denies any nausea or vomiting.  Skin is very dry and there is discoloration of the nailbeds along with some evidence of brittle nails.  ALLERGIES:  is allergic to amoxicillin, ciprofloxacin, and penicillins.  MEDICATIONS:  Current Outpatient Medications  Medication Sig Dispense Refill  . Cholecalciferol (VITAMIN D-3 PO) Take 6,000 Units by mouth daily.     . clotrimazole-betamethasone (LOTRISONE) cream clotrimazole-betamethasone 1 %-0.05 % topical cream  APPLY TO AFFECTED AREA TWICE A DAY MORNING AND EVENING FOR 2 WEEKS    . dexamethasone (DECADRON) 4 MG tablet Take 1 tablet (4 mg total) by mouth daily. 1 tab day before chemo and 1 tab day after chemo 12 tablet 0  . diphenoxylate-atropine (LOMOTIL) 2.5-0.025 MG tablet Take 1 tablet by mouth 4 (four) times daily as needed for diarrhea or loose stools. 30 tablet 2  . lidocaine-prilocaine (EMLA) cream Apply to affected area once 30 g 3  . lisinopril (ZESTRIL) 20 MG tablet TAKE  1 TABLET DAILY 90 tablet 3  . LORazepam (ATIVAN) 0.5 MG tablet Take 1 tablet (0.5 mg total) by mouth at bedtime as needed for sleep. 30 tablet 0  . MAGNESIUM CITRATE PO Take 500 mcg by  mouth. Only when having trouble sleeping    . ondansetron (ZOFRAN) 8 MG tablet Take 1 tablet (8 mg total) by mouth 2 (two) times daily as needed (Nausea or vomiting). Start on the third day after chemotherapy. 30 tablet 1  . pantoprazole (PROTONIX) 20 MG tablet Take 1 tablet (20 mg total) by mouth daily. 30 tablet 2  . prochlorperazine (COMPAZINE) 10 MG tablet Take 1 tablet (10 mg total) by mouth every 6 (six) hours as needed (Nausea or vomiting). 30 tablet 1  . SYNTHROID 75 MCG tablet TAKE 1 TABLET ON MONDAY THROUGH SUNDAY 90 tablet 3  . triamcinolone (KENALOG) 0.1 % triamcinolone acetonide 0.1 % topical cream  APPLY 2 TIMES EVERY DAY A THIN LAYER TO THE AFFECTED AREA(S)     No current facility-administered medications for this visit.    PHYSICAL EXAMINATION: ECOG PERFORMANCE STATUS: 1 - Symptomatic but completely ambulatory  Vitals:   06/18/20 0812  BP: (!) 146/99  Pulse: 85  Resp: 18  Temp: 97.9 F (36.6 C)  SpO2: 99%   Filed Weights   06/18/20 0812  Weight: 187 lb (84.8 kg)    LABORATORY DATA:  I have reviewed the data as listed CMP Latest Ref Rng & Units 05/28/2020 05/07/2020 04/22/2020  Glucose 70 - 99 mg/dL 108(H) 102(H) 115(H)  BUN 6 - 20 mg/dL 11 13 15   Creatinine 0.44 - 1.00 mg/dL 0.78 0.76 1.18(H)  Sodium 135 - 145 mmol/L 142 139 137  Potassium 3.5 - 5.1 mmol/L 3.7 3.7 4.1  Chloride 98 - 111 mmol/L 108 106 100  CO2 22 - 32 mmol/L 25 24 32  Calcium 8.9 - 10.3 mg/dL 9.4 9.0 9.4  Total Protein 6.5 - 8.1 g/dL 7.0 7.4 7.6  Total Bilirubin 0.3 - 1.2 mg/dL 0.6 0.4 0.3  Alkaline Phos 38 - 126 U/L 83 88 88  AST 15 - 41 U/L 28 18 19   ALT 0 - 44 U/L 26 17 19     Lab Results  Component Value Date   WBC 7.6 06/18/2020   HGB 11.1 (L) 06/18/2020   HCT 32.9 (L) 06/18/2020   MCV 94.0 06/18/2020   PLT 225 06/18/2020   NEUTROABS 4.8 06/18/2020    ASSESSMENT & PLAN:  Malignant neoplasm of upper-outer quadrant of right breast in female, estrogen receptor negative  ([place]) 03/28/2020:Patient palpated a right breast mass x2 wks. Mammogram and US showed a 2.0cm mass at the 10 o'clock position in the right breast and two enlarged right axillary lymph nodes, 2.0cm and 1.7cm. 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 Satellite lesion biopsy: DCIS ER 0%, PR 0%  Treatment plan: 1. Neoadjuvant chemotherapy with TCH Perjeta 6 cycles followed by HerceptinPerjeta versus Kadcylamaintenance for 1 year 2. Followed by breast conserving surgerywith targeted node dissection 3. Followed by adjuvant radiation therapy if patient had lumpectomy ----------------------------------------------------------------------------------------------------------------------------------------- Current treatment: Cycle4TCH Perjeta Echocardiogram 04/09/2020: EF 60 to 65%  Chemo toxicities: 1.Severe fatigue 2.diarrhea: Gets 4-5 loose stools per day.  This is in spite of Imodium and Lomotil. 3.chemo induced anemia: Monitoring closely today's hemoglobin 11.1  We decided to remove pregnancy tests with each treatment based on patient's wishes. Lengthy discussion today about the number of cycles  of chemotherapy.   Return to clinic in3weeks for cycle 5.    No orders of the defined types were placed in this encounter.  The patient has a good understanding of the overall plan. she agrees with it. she will call with any problems that may develop before the next visit here.  Total time spent: 30 mins including face to face time and time spent for planning, charting and coordination of care  Nicholas Lose, MD 06/18/2020  I, Cloyde Reams Dorshimer, am acting as scribe for Dr. Nicholas Lose.  I have reviewed the above documentation for accuracy and completeness, and I agree with the above.

## 2020-06-18 ENCOUNTER — Inpatient Hospital Stay: Attending: Hematology and Oncology

## 2020-06-18 ENCOUNTER — Other Ambulatory Visit: Payer: Self-pay

## 2020-06-18 ENCOUNTER — Other Ambulatory Visit: Payer: Self-pay | Admitting: Hematology and Oncology

## 2020-06-18 ENCOUNTER — Inpatient Hospital Stay (HOSPITAL_BASED_OUTPATIENT_CLINIC_OR_DEPARTMENT_OTHER): Admitting: Hematology and Oncology

## 2020-06-18 ENCOUNTER — Ambulatory Visit

## 2020-06-18 ENCOUNTER — Encounter: Admitting: Internal Medicine

## 2020-06-18 ENCOUNTER — Inpatient Hospital Stay

## 2020-06-18 VITALS — BP 160/100 | HR 79 | Temp 98.3°F | Resp 18

## 2020-06-18 DIAGNOSIS — Z8041 Family history of malignant neoplasm of ovary: Secondary | ICD-10-CM | POA: Diagnosis not present

## 2020-06-18 DIAGNOSIS — J029 Acute pharyngitis, unspecified: Secondary | ICD-10-CM | POA: Diagnosis not present

## 2020-06-18 DIAGNOSIS — C50411 Malignant neoplasm of upper-outer quadrant of right female breast: Secondary | ICD-10-CM | POA: Diagnosis not present

## 2020-06-18 DIAGNOSIS — D6481 Anemia due to antineoplastic chemotherapy: Secondary | ICD-10-CM | POA: Diagnosis not present

## 2020-06-18 DIAGNOSIS — R6883 Chills (without fever): Secondary | ICD-10-CM | POA: Insufficient documentation

## 2020-06-18 DIAGNOSIS — Z79899 Other long term (current) drug therapy: Secondary | ICD-10-CM | POA: Diagnosis not present

## 2020-06-18 DIAGNOSIS — Z20822 Contact with and (suspected) exposure to covid-19: Secondary | ICD-10-CM | POA: Insufficient documentation

## 2020-06-18 DIAGNOSIS — Z5112 Encounter for antineoplastic immunotherapy: Secondary | ICD-10-CM | POA: Insufficient documentation

## 2020-06-18 DIAGNOSIS — T451X5A Adverse effect of antineoplastic and immunosuppressive drugs, initial encounter: Secondary | ICD-10-CM | POA: Insufficient documentation

## 2020-06-18 DIAGNOSIS — R5383 Other fatigue: Secondary | ICD-10-CM | POA: Insufficient documentation

## 2020-06-18 DIAGNOSIS — Z5189 Encounter for other specified aftercare: Secondary | ICD-10-CM | POA: Insufficient documentation

## 2020-06-18 DIAGNOSIS — Z8249 Family history of ischemic heart disease and other diseases of the circulatory system: Secondary | ICD-10-CM | POA: Diagnosis not present

## 2020-06-18 DIAGNOSIS — R197 Diarrhea, unspecified: Secondary | ICD-10-CM | POA: Insufficient documentation

## 2020-06-18 DIAGNOSIS — Z171 Estrogen receptor negative status [ER-]: Secondary | ICD-10-CM | POA: Insufficient documentation

## 2020-06-18 DIAGNOSIS — Z5111 Encounter for antineoplastic chemotherapy: Secondary | ICD-10-CM | POA: Diagnosis not present

## 2020-06-18 DIAGNOSIS — E039 Hypothyroidism, unspecified: Secondary | ICD-10-CM | POA: Insufficient documentation

## 2020-06-18 LAB — CBC WITH DIFFERENTIAL (CANCER CENTER ONLY)
Abs Immature Granulocytes: 0.02 10*3/uL (ref 0.00–0.07)
Basophils Absolute: 0.1 10*3/uL (ref 0.0–0.1)
Basophils Relative: 1 %
Eosinophils Absolute: 0 10*3/uL (ref 0.0–0.5)
Eosinophils Relative: 0 %
HCT: 32.9 % — ABNORMAL LOW (ref 36.0–46.0)
Hemoglobin: 11.1 g/dL — ABNORMAL LOW (ref 12.0–15.0)
Immature Granulocytes: 0 %
Lymphocytes Relative: 23 %
Lymphs Abs: 1.8 10*3/uL (ref 0.7–4.0)
MCH: 31.7 pg (ref 26.0–34.0)
MCHC: 33.7 g/dL (ref 30.0–36.0)
MCV: 94 fL (ref 80.0–100.0)
Monocytes Absolute: 0.9 10*3/uL (ref 0.1–1.0)
Monocytes Relative: 12 %
Neutro Abs: 4.8 10*3/uL (ref 1.7–7.7)
Neutrophils Relative %: 64 %
Platelet Count: 225 10*3/uL (ref 150–400)
RBC: 3.5 MIL/uL — ABNORMAL LOW (ref 3.87–5.11)
RDW: 17.4 % — ABNORMAL HIGH (ref 11.5–15.5)
WBC Count: 7.6 10*3/uL (ref 4.0–10.5)
nRBC: 0 % (ref 0.0–0.2)

## 2020-06-18 LAB — CMP (CANCER CENTER ONLY)
ALT: 16 U/L (ref 0–44)
AST: 20 U/L (ref 15–41)
Albumin: 3.9 g/dL (ref 3.5–5.0)
Alkaline Phosphatase: 77 U/L (ref 38–126)
Anion gap: 6 (ref 5–15)
BUN: 8 mg/dL (ref 6–20)
CO2: 29 mmol/L (ref 22–32)
Calcium: 9 mg/dL (ref 8.9–10.3)
Chloride: 106 mmol/L (ref 98–111)
Creatinine: 0.76 mg/dL (ref 0.44–1.00)
GFR, Estimated: >60 mL/min (ref >60)
Glucose, Bld: 95 mg/dL (ref 70–99)
Potassium: 3 mmol/L — ABNORMAL LOW (ref 3.5–5.1)
Sodium: 141 mmol/L (ref 135–145)
Total Bilirubin: 0.6 mg/dL (ref 0.3–1.2)
Total Protein: 6.9 g/dL (ref 6.5–8.1)

## 2020-06-18 LAB — PREGNANCY, URINE: Preg Test, Ur: NEGATIVE

## 2020-06-18 MED ORDER — SODIUM CHLORIDE 0.9% FLUSH
10.0000 mL | INTRAVENOUS | Status: DC | PRN
  Administered 2020-06-18: 10 mL
  Filled 2020-06-18: qty 10

## 2020-06-18 MED ORDER — SODIUM CHLORIDE 0.9 % IV SOLN
150.0000 mg | Freq: Once | INTRAVENOUS | Status: AC
  Administered 2020-06-18: 150 mg via INTRAVENOUS
  Filled 2020-06-18: qty 150

## 2020-06-18 MED ORDER — SODIUM CHLORIDE 0.9 % IV SOLN
75.0000 mg/m2 | Freq: Once | INTRAVENOUS | Status: AC
  Administered 2020-06-18: 150 mg via INTRAVENOUS
  Filled 2020-06-18: qty 15

## 2020-06-18 MED ORDER — SODIUM CHLORIDE 0.9 % IV SOLN
Freq: Once | INTRAVENOUS | Status: AC
  Filled 2020-06-18: qty 250

## 2020-06-18 MED ORDER — TRASTUZUMAB-DKST CHEMO 150 MG IV SOLR
6.0000 mg/kg | Freq: Once | INTRAVENOUS | Status: AC
  Administered 2020-06-18: 483 mg via INTRAVENOUS
  Filled 2020-06-18: qty 23

## 2020-06-18 MED ORDER — HEPARIN SOD (PORK) LOCK FLUSH 100 UNIT/ML IV SOLN
500.0000 [IU] | Freq: Once | INTRAVENOUS | Status: AC | PRN
  Administered 2020-06-18: 500 [IU]
  Filled 2020-06-18: qty 5

## 2020-06-18 MED ORDER — PALONOSETRON HCL INJECTION 0.25 MG/5ML
INTRAVENOUS | Status: AC
  Filled 2020-06-18: qty 5

## 2020-06-18 MED ORDER — SODIUM CHLORIDE 0.9 % IV SOLN
786.0000 mg | Freq: Once | INTRAVENOUS | Status: AC
  Administered 2020-06-18: 790 mg via INTRAVENOUS
  Filled 2020-06-18: qty 79

## 2020-06-18 MED ORDER — PALONOSETRON HCL INJECTION 0.25 MG/5ML
0.2500 mg | Freq: Once | INTRAVENOUS | Status: AC
  Administered 2020-06-18: 0.25 mg via INTRAVENOUS

## 2020-06-18 MED ORDER — POTASSIUM CHLORIDE ER 10 MEQ PO TBCR: 10.0000 meq | EXTENDED_RELEASE_TABLET | Freq: Every day | ORAL | 1 refills | Status: DC

## 2020-06-18 MED ORDER — DIPHENHYDRAMINE HCL 25 MG PO CAPS
ORAL_CAPSULE | ORAL | Status: AC
  Filled 2020-06-18: qty 1

## 2020-06-18 MED ORDER — SODIUM CHLORIDE 0.9 % IV SOLN
10.0000 mg | Freq: Once | INTRAVENOUS | Status: AC
  Administered 2020-06-18: 10 mg via INTRAVENOUS
  Filled 2020-06-18: qty 10

## 2020-06-18 MED ORDER — ACETAMINOPHEN 325 MG PO TABS
650.0000 mg | ORAL_TABLET | Freq: Once | ORAL | Status: AC
  Administered 2020-06-18: 650 mg via ORAL

## 2020-06-18 MED ORDER — ACETAMINOPHEN 325 MG PO TABS
ORAL_TABLET | ORAL | Status: AC
  Filled 2020-06-18: qty 2

## 2020-06-18 MED ORDER — SODIUM CHLORIDE 0.9 % IV SOLN
420.0000 mg | Freq: Once | INTRAVENOUS | Status: AC
  Administered 2020-06-18: 420 mg via INTRAVENOUS
  Filled 2020-06-18: qty 14

## 2020-06-18 MED ORDER — DIPHENHYDRAMINE HCL 25 MG PO CAPS
25.0000 mg | ORAL_CAPSULE | Freq: Once | ORAL | Status: AC
  Administered 2020-06-18: 25 mg via ORAL

## 2020-06-18 NOTE — Patient Instructions (Signed)
[facility] Discharge Instructions for Patients Receiving Chemotherapy  Today you received the following chemotherapy agents: trastuzumab/pertuzumab/docetaxel/carboplatin.  To help prevent nausea and vomiting after your treatment, we encourage you to take your nausea medication as directed.   If you develop nausea and vomiting that is not controlled by your nausea medication, call the clinic.   BELOW ARE SYMPTOMS THAT SHOULD BE REPORTED IMMEDIATELY:  *FEVER GREATER THAN 100.5 F  *CHILLS WITH OR WITHOUT FEVER  NAUSEA AND VOMITING THAT IS NOT CONTROLLED WITH YOUR NAUSEA MEDICATION  *UNUSUAL SHORTNESS OF BREATH  *UNUSUAL BRUISING OR BLEEDING  TENDERNESS IN MOUTH AND THROAT WITH OR WITHOUT PRESENCE OF ULCERS  *URINARY PROBLEMS  *BOWEL PROBLEMS  UNUSUAL RASH Items with * indicate a potential emergency and should be followed up as soon as possible.  Feel free to call the clinic should you have any questions or concerns. The clinic phone number is [phone].  Please show the CHEMO ALERT CARD at check-in to the Emergency Department and triage nurse.  

## 2020-06-18 NOTE — Progress Notes (Signed)
Shannon Mealy, PA, notified of patient's BP of 160/100. Patient denies chest pain or dizziness. Per [name], patient is to take regular home BP medication once she gets home today. Patient notified and she verbalized understanding. Patient ambulatory with no complaints upon discharge.

## 2020-06-18 NOTE — Progress Notes (Signed)
Low potassium: 3.0 I sent a prescription for 10 mEq of potassium chloride to her pharmacy.  This is because of her diarrhea.

## 2020-06-19 ENCOUNTER — Ambulatory Visit

## 2020-06-19 ENCOUNTER — Other Ambulatory Visit

## 2020-06-19 ENCOUNTER — Ambulatory Visit: Admitting: Hematology and Oncology

## 2020-06-19 LAB — THYROID PANEL WITH TSH
Free Thyroxine Index: 1.8 (ref 1.2–4.9)
T3 Uptake Ratio: 23 % — ABNORMAL LOW (ref 24–39)
T4, Total: 8 ug/dL (ref 4.5–12.0)
TSH: 2.28 u[IU]/mL (ref 0.450–4.500)

## 2020-06-20 ENCOUNTER — Other Ambulatory Visit: Payer: Self-pay

## 2020-06-20 ENCOUNTER — Inpatient Hospital Stay

## 2020-06-20 ENCOUNTER — Telehealth: Payer: Self-pay | Admitting: *Deleted

## 2020-06-20 VITALS — BP 156/96 | HR 87 | Temp 98.1°F | Resp 18

## 2020-06-20 DIAGNOSIS — Z171 Estrogen receptor negative status [ER-]: Secondary | ICD-10-CM

## 2020-06-20 DIAGNOSIS — C50411 Malignant neoplasm of upper-outer quadrant of right female breast: Secondary | ICD-10-CM

## 2020-06-20 DIAGNOSIS — Z5111 Encounter for antineoplastic chemotherapy: Secondary | ICD-10-CM | POA: Diagnosis not present

## 2020-06-20 MED ORDER — PEGFILGRASTIM-JMDB 6 MG/0.6ML ~~LOC~~ SOSY
PREFILLED_SYRINGE | SUBCUTANEOUS | Status: AC
  Filled 2020-06-20: qty 0.6

## 2020-06-20 MED ORDER — PEGFILGRASTIM-JMDB 6 MG/0.6ML ~~LOC~~ SOSY
6.0000 mg | PREFILLED_SYRINGE | Freq: Once | SUBCUTANEOUS | Status: AC
  Administered 2020-06-20: 6 mg via SUBCUTANEOUS

## 2020-06-20 NOTE — Patient Instructions (Signed)
Pegfilgrastim injection What is this medicine? PEGFILGRASTIM (PEG fil gra stim) is a long-acting granulocyte colony-stimulating factor that stimulates the growth of neutrophils, a type of white blood cell important in the body's fight against infection. It is used to reduce the incidence of fever and infection in patients with certain types of cancer who are receiving chemotherapy that affects the bone marrow, and to increase survival after being exposed to high doses of radiation. This medicine may be used for other purposes; ask your health care provider or pharmacist if you have questions. COMMON BRAND NAME(S): Fulphila, Neulasta, Nyvepria, UDENYCA, Ziextenzo What should I tell my health care provider before I take this medicine? They need to know if you have any of these conditions:  kidney disease  latex allergy  ongoing radiation therapy  sickle cell disease  skin reactions to acrylic adhesives (On-Body Injector only)  an unusual or allergic reaction to pegfilgrastim, filgrastim, other medicines, foods, dyes, or preservatives  pregnant or trying to get pregnant  breast-feeding How should I use this medicine? This medicine is for injection under the skin. If you get this medicine at home, you will be taught how to prepare and give the pre-filled syringe or how to use the On-body Injector. Refer to the patient Instructions for Use for detailed instructions. Use exactly as directed. Tell your healthcare provider immediately if you suspect that the On-body Injector may not have performed as intended or if you suspect the use of the On-body Injector resulted in a missed or partial dose. It is important that you put your used needles and syringes in a special sharps container. Do not put them in a trash can. If you do not have a sharps container, call your pharmacist or healthcare provider to get one. Talk to your pediatrician regarding the use of this medicine in children. While this drug  may be prescribed for selected conditions, precautions do apply. Overdosage: If you think you have taken too much of this medicine contact a poison control center or emergency room at once. NOTE: This medicine is only for you. Do not share this medicine with others. What if I miss a dose? It is important not to miss your dose. Call your doctor or health care professional if you miss your dose. If you miss a dose due to an On-body Injector failure or leakage, a new dose should be administered as soon as possible using a single prefilled syringe for manual use. What may interact with this medicine? Interactions have not been studied. This list may not describe all possible interactions. Give your health care provider a list of all the medicines, herbs, non-prescription drugs, or dietary supplements you use. Also tell them if you smoke, drink alcohol, or use illegal drugs. Some items may interact with your medicine. What should I watch for while using this medicine? Your condition will be monitored carefully while you are receiving this medicine. You may need blood work done while you are taking this medicine. Talk to your health care provider about your risk of cancer. You may be more at risk for certain types of cancer if you take this medicine. If you are going to need a MRI, CT scan, or other procedure, tell your doctor that you are using this medicine (On-Body Injector only). What side effects may I notice from receiving this medicine? Side effects that you should report to your doctor or health care professional as soon as possible:  allergic reactions (skin rash, itching or hives, swelling of   the face, lips, or tongue)  back pain  dizziness  fever  pain, redness, or irritation at site where injected  pinpoint red spots on the skin  red or dark-brown urine  shortness of breath or breathing problems  stomach or side pain, or pain at the shoulder  swelling  tiredness  trouble  passing urine or change in the amount of urine  unusual bruising or bleeding Side effects that usually do not require medical attention (report to your doctor or health care professional if they continue or are bothersome):  bone pain  muscle pain This list may not describe all possible side effects. Call your doctor for medical advice about side effects. You may report side effects to FDA at 1-800-FDA-1088. Where should I keep my medicine? Keep out of the reach of children. If you are using this medicine at home, you will be instructed on how to store it. Throw away any unused medicine after the expiration date on the label. NOTE: This sheet is a summary. It may not cover all possible information. If you have questions about this medicine, talk to your doctor, pharmacist, or health care provider.  2021 Elsevier/Gold Standard (2019-05-26 13:20:51)  

## 2020-06-20 NOTE — Telephone Encounter (Signed)
Per Annabelle Harman, called to f/u with pt on potassium supplement. Pt stated she began Tuesday evening taking potassium. Diarrhea has stopped, still increased urination, dry mouth and extreme fatigue, but otherwise doing fine. Advised pt to increase fluid and advised of a potassium rich diet. Pt verbalized understanding.

## 2020-06-21 ENCOUNTER — Ambulatory Visit

## 2020-06-26 ENCOUNTER — Telehealth: Payer: Self-pay | Admitting: *Deleted

## 2020-06-26 NOTE — Telephone Encounter (Signed)
Received call from pt with complaint of hoarseness in voice.  Pt also states her tongue is extremely sore with dry mouth.  Pt denies fever.  Per MD pt needing to be seen by Wilber Bihari, NP for further evaluation.  Apt scheduled and pt verbalized understanding of apt date and time.

## 2020-06-27 ENCOUNTER — Encounter: Payer: Self-pay | Admitting: Adult Health

## 2020-06-27 ENCOUNTER — Inpatient Hospital Stay (HOSPITAL_BASED_OUTPATIENT_CLINIC_OR_DEPARTMENT_OTHER): Admitting: Adult Health

## 2020-06-27 ENCOUNTER — Inpatient Hospital Stay (HOSPITAL_BASED_OUTPATIENT_CLINIC_OR_DEPARTMENT_OTHER): Admitting: Medical

## 2020-06-27 ENCOUNTER — Other Ambulatory Visit: Payer: Self-pay

## 2020-06-27 VITALS — BP 119/91 | HR 118 | Temp 97.9°F | Resp 18 | Ht 68.0 in | Wt 187.5 lb

## 2020-06-27 DIAGNOSIS — C50411 Malignant neoplasm of upper-outer quadrant of right female breast: Secondary | ICD-10-CM

## 2020-06-27 DIAGNOSIS — R059 Cough, unspecified: Secondary | ICD-10-CM

## 2020-06-27 DIAGNOSIS — Z171 Estrogen receptor negative status [ER-]: Secondary | ICD-10-CM | POA: Diagnosis not present

## 2020-06-27 DIAGNOSIS — Z5111 Encounter for antineoplastic chemotherapy: Secondary | ICD-10-CM | POA: Diagnosis not present

## 2020-06-27 LAB — SARS CORONAVIRUS 2 (TAT 6-24 HRS): SARS Coronavirus 2: NEGATIVE

## 2020-06-27 NOTE — Progress Notes (Signed)
[facility] Follow up:    Shannon Kane, Shannon Kane [id number]   DIAGNOSIS: Cancer Staging Malignant neoplasm of upper-outer quadrant of right breast in female, estrogen receptor negative ([place]) Staging form: Breast, AJCC 8th Edition - Clinical stage from 04/03/2020: Stage IIA (cT1c, cN1, cM0, G1, ER-, PR-, HER2+) - Unsigned Stage prefix: Initial diagnosis Histologic grading system: 3 grade system   SUMMARY OF ONCOLOGIC HISTORY: Oncology History  Malignant neoplasm of upper-outer quadrant of right breast in female, estrogen receptor negative ([place])  03/28/2020 Initial Diagnosis   Patient palpated a right breast mass x2 wks. Mammogram and US showed a 2.0cm mass at the 10 o'clock position in the right breast and two enlarged right axillary lymph nodes, 2.0cm and 1.7cm. 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   04/16/2020 -  Chemotherapy    Patient is on Treatment Plan: BREAST  DOCETAXEL + CARBOPLATIN + TRASTUZUMAB + PERTUZUMAB  (TCHP) Q21D       04/18/2020 Genetic Testing   Negative genetic testing: no pathogenic variants detected in [facility] Multi-Cancer Panel.  Variants of uncertain significance detected in CDH1 at c.1289T>G (p.Val430Gly) and EGFR at c.2873G>A (p.Arg958His).  The report date is April 18, 2020.   The Multi-Cancer Panel offered by [facility] includes sequencing and/or deletion duplication testing of the following 85 genes: AIP, ALK, APC, ATM, AXIN2,BAP1,  BARD1, BLM, BMPR1A, BRCA1, BRCA2, BRIP1, CASR, CDC73, CDH1, CDK4, CDKN1B, CDKN1C, CDKN2A (p14ARF), CDKN2A (p16INK4a), CEBPA, CHEK2, CTNNA1, DICER1, DIS3L2, EGFR, EPCAM (Deletion/duplication testing only), FH, FLCN, GATA2, GPC3, GREM1 (Promoter region deletion/duplication testing only), HOXB13 (c.251G>A, p.Gly84Glu), HRAS, KIT, MAX, MEN1, MET, MITF (c.952G>A, p.Glu318Lys variant only), MLH1, MSH2, MSH3, MSH6,  MUTYH, NBN, NF1, NF2, NTHL1, PALB2, PDGFRA, PHOX2B, PMS2, POLD1, POLE, POT1, PRKAR1A, PTCH1, PTEN, RAD50, RAD51C, RAD51D, RB1, RECQL4, RET, RNF43, RUNX1, SDHAF2, SDHA (sequence changes only), SDHB, SDHC, SDHD, SMAD4, SMARCA4, SMARCB1, SMARCE1, STK11, SUFU, TERC, TERT, TMEM127, TP53, TSC1, TSC2, VHL, WRN and WT1.      CURRENT THERAPY: Cycle 4 day 10 TCHP  INTERVAL HISTORY: Shannon Kane 50 y.o. female returns for evaluation of dry mouth and hoarseness.  She has some nasal drainage this morning.  She has not had a cough.     Patient Active Problem List   Diagnosis Date Noted  . Port-A-Cath in place 05/07/2020  . Menorrhagia 04/19/2020  . Uterine leiomyoma 04/19/2020  . Genetic testing 04/19/2020  . Family history of ovarian cancer 04/03/2020  . Family history of leukemia 04/03/2020  . Family history of breast cancer 04/03/2020  . Malignant neoplasm of upper-outer quadrant of right breast in female, estrogen receptor negative ([place]) 03/28/2020  . Infiltrating ductal carcinoma of breast ([place]) 03/27/2020  . Overweight (BMI 25.0-29.9) 12/05/2019  . Essential hypertension, benign 05/29/2018  . Iritis of left eye 05/29/2018  . Iron deficiency anemia due to chronic blood loss 05/29/2018  . Primary hypothyroidism 05/29/2018  . Stress incontinence in female 02/16/2016    is allergic to amoxicillin, ciprofloxacin, and penicillins.  MEDICAL HISTORY: Past Medical History:  Diagnosis Date  . Abnormal glucose   . Breast cancer ([place]) 03/2020   right breast IDC  . Family history of breast cancer 04/03/2020  . Family history of ovarian cancer 04/03/2020  . Hypertension   . Hypothyroidism   . Iron deficiency anemia   . Palpitation   . Spontaneous ecchymoses   . Tachycardia   .  Vitamin D deficiency     SURGICAL HISTORY: Past Surgical History:  Procedure Laterality Date  . BREAST BIOPSY    . BREAST EXCISIONAL BIOPSY    . CHOLECYSTECTOMY    . KNEE SURGERY    . PORTACATH PLACEMENT Left  04/15/2020   Procedure: INSERTION PORT-A-CATH;  Surgeon: Coralie Keens, MD;  Location: [facility];  Service: General;  Laterality: Left;  . TONSILLECTOMY      SOCIAL HISTORY: Social History   Socioeconomic History  . Marital status: Married    Spouse name: Not on file  . Number of children: Not on file  . Years of education: Not on file  . Highest education level: Not on file  Occupational History  . Not on file  Tobacco Use  . Smoking status: Never Smoker  . Smokeless tobacco: Never Used  Vaping Use  . Vaping Use: Never used  Substance and Sexual Activity  . Alcohol use: Yes    Alcohol/week: 0.0 standard drinks    Comment: social  . Drug use: Never  . Sexual activity: Not Currently    Birth control/protection: None  Other Topics Concern  . Not on file  Social History Narrative  . Not on file   Social Determinants of Health   Financial Resource Strain: Not on file  Food Insecurity: No Food Insecurity  . Worried About Charity fundraiser in the Last Year: Never true  . Ran Out of Food in the Last Year: Never true  Transportation Needs: No Transportation Needs  . Lack of Transportation (Medical): No  . Lack of Transportation (Non-Medical): No  Physical Activity: Not on file  Stress: Not on file  Social Connections: Not on file  Intimate Partner Violence: Not on file    FAMILY HISTORY: Family History  Problem Relation Age of Onset  . Diabetes Mother   . Hypertension Mother   . Leukemia Father        dx > 50  . Ovarian cancer Maternal Grandmother 80  . Bone cancer Paternal Grandfather        dx unknown age  . Cancer Maternal Uncle        Mouth and throad; dx > 50  . Breast cancer Other        MGM's niece, dx unknown age  . Cancer Maternal Uncle        unknown type; dx > 50    Review of Systems  Constitutional: Positive for chills and fatigue. Negative for appetite change, fever and unexpected weight change.  HENT:   Positive for sore  throat and voice change. Negative for hearing loss, lump/mass and trouble swallowing.   Eyes: Negative for eye problems and icterus.  Respiratory: Negative for chest tightness, cough and shortness of breath.   Cardiovascular: Negative for chest pain, leg swelling and palpitations.  Gastrointestinal: Negative for abdominal distention, abdominal pain, constipation, diarrhea, nausea and vomiting.  Endocrine: Negative for hot flashes.  Genitourinary: Negative for difficulty urinating.   Musculoskeletal: Positive for back pain (after neulasta) and myalgias (muscle soreness ). Negative for arthralgias.  Skin: Negative for itching and rash.  Neurological: Negative for dizziness, extremity weakness, headaches and numbness.  Hematological: Negative for adenopathy. Does not bruise/bleed easily.  Psychiatric/Behavioral: Negative for depression. The patient is not nervous/anxious.       PHYSICAL EXAMINATION  ECOG PERFORMANCE STATUS: 1 - Symptomatic but completely ambulatory  Vitals:   06/27/20 1504  BP: (!) 119/91  Pulse: (!) 118  Resp: 18  Temp: 97.9 F (36.6 C)  SpO2: 95%    Physical Exam Constitutional:      General: She is not in acute distress.    Appearance: Normal appearance. She is not toxic-appearing.  HENT:     Head: Normocephalic and atraumatic.  Eyes:     General: No scleral icterus. Cardiovascular:     Rate and Rhythm: Normal rate and regular rhythm.     Pulses: Normal pulses.     Heart sounds: Normal heart sounds.  Pulmonary:     Effort: Pulmonary effort is normal.     Breath sounds: Normal breath sounds.  Abdominal:     General: Abdomen is flat. Bowel sounds are normal. There is no distension.     Palpations: Abdomen is soft.     Tenderness: There is no abdominal tenderness.  Musculoskeletal:        General: No swelling.     Cervical back: Neck supple.  Lymphadenopathy:     Cervical: No cervical adenopathy.  Skin:    General: Skin is warm and dry.      Findings: No rash.  Neurological:     General: No focal deficit present.     Mental Status: She is alert.  Psychiatric:        Mood and Affect: Mood normal.        Behavior: Behavior normal.     LABORATORY DATA:  CBC    Component Value Date/Time   WBC 7.6 06/18/2020 0800   WBC 5.2 04/15/2020 1342   RBC 3.50 (L) 06/18/2020 0800   HGB 11.1 (L) 06/18/2020 0800   HGB 13.9 12/05/2019 1643   HGB 12.3 06/26/2015 1332   HCT 32.9 (L) 06/18/2020 0800   HCT 40.5 12/05/2019 1643   HCT 36.6 06/26/2015 1332   PLT 225 06/18/2020 0800   PLT 316 12/05/2019 1643   MCV 94.0 06/18/2020 0800   MCV 88 12/05/2019 1643   MCV 90 06/26/2015 1332   MCH 31.7 06/18/2020 0800   MCHC 33.7 06/18/2020 0800   RDW 17.4 (H) 06/18/2020 0800   RDW 12.4 12/05/2019 1643   RDW 14.9 06/26/2015 1332   LYMPHSABS 1.8 06/18/2020 0800   LYMPHSABS 1.5 06/26/2015 1332   MONOABS 0.9 06/18/2020 0800   EOSABS 0.0 06/18/2020 0800   EOSABS 0.1 06/26/2015 1332   BASOSABS 0.1 06/18/2020 0800   BASOSABS 0.0 06/26/2015 1332    CMP     Component Value Date/Time   NA 141 06/18/2020 0800   NA 140 12/05/2019 1643   K 3.0 (L) 06/18/2020 0800   CL 106 06/18/2020 0800   CO2 29 06/18/2020 0800   GLUCOSE 95 06/18/2020 0800   BUN 8 06/18/2020 0800   BUN 9 12/05/2019 1643   CREATININE 0.76 06/18/2020 0800   CALCIUM 9.0 06/18/2020 0800   PROT 6.9 06/18/2020 0800   PROT 7.8 12/05/2019 1643   ALBUMIN 3.9 06/18/2020 0800   ALBUMIN 4.5 12/05/2019 1643   AST 20 06/18/2020 0800   ALT 16 06/18/2020 0800   ALKPHOS 77 06/18/2020 0800   BILITOT 0.6 06/18/2020 0800   GFRNONAA >60 06/18/2020 0800   GFRAA 101 12/05/2019 1643      ASSESSMENT and PLAN:   Malignant neoplasm of upper-outer quadrant of right breast in female, estrogen receptor negative ([place]) 03/28/2020:Patient palpated a right breast mass x2 wks. Mammogram and US showed a 2.0cm mass at the 10 o'clock position in the right breast and two enlarged right axillary  lymph nodes, 2.0cm and 1.7cm. Biopsy  showed IDC in the breast and axilla, grade 2, HER-2 positive (3+), ER+ 10% weak, PR- 0%, Ki67 30%. Satellite lesion biopsy: DCIS ER 0%, PR 0%  Treatment plan: 1. Neoadjuvant chemotherapy with TCH Perjeta 6 cycles followed by HerceptinPerjeta versus Kadcylamaintenance for 1 year 2. Followed by breast conserving surgerywith targeted node dissection 3. Followed by adjuvant radiation therapy if patient had lumpectomy ----------------------------------------------------------------------------------------------------------------------------------------- Current treatment: Cycle4 day 10TCH Perjeta Echocardiogram 04/09/2020: EF 60 to 65%  We are sending a covid test.  Will await those results.    For now she should continue supportive care with hydration, and OTC remedies.  Should it return positive she will be referred for COVID treatment.        All questions were answered. The patient knows to call the clinic with any problems, questions or concerns. We can certainly see the patient much sooner if necessary.  Total encounter time: 20 minutes*  Wilber Bihari, NP 06/27/20 3:49 PM Medical Oncology and Hematology [facility] [place], [place] [address] Tel. [phone]    Fax. [phone]  *Total Encounter Time as defined by the Centers for Medicare and Medicaid Services includes, in addition to the face-to-face time of a patient visit (documented in the note above) non-face-to-face time: obtaining and reviewing outside history, ordering and reviewing medications, tests or procedures, care coordination (communications with other health care professionals or caregivers) and documentation in the medical record.

## 2020-06-27 NOTE — Progress Notes (Signed)
The patient was seen for COVID-19 testing.  A sample was collected and submitted as requested.  Sandi Mealy, MHS, PA-C Physician Assistant

## 2020-06-27 NOTE — Assessment & Plan Note (Addendum)
03/28/2020:Patient palpated a right breast mass x2 wks. Mammogram and US showed a 2.0cm mass at the 10 o'clock position in the right breast and two enlarged right axillary lymph nodes, 2.0cm and 1.7cm. 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 Satellite lesion biopsy: DCIS ER 0%, PR 0%  Treatment plan: 1. Neoadjuvant chemotherapy with TCH Perjeta 6 cycles followed by HerceptinPerjeta versus Kadcylamaintenance for 1 year 2. Followed by breast conserving surgerywith targeted node dissection 3. Followed by adjuvant radiation therapy if patient had lumpectomy ----------------------------------------------------------------------------------------------------------------------------------------- Current treatment: Cycle4 day 10TCH Perjeta Echocardiogram 04/09/2020: EF 60 to 65%  We are sending a covid test.  Will await those results.    For now she should continue supportive care with hydration, and OTC remedies.  Should it return positive she will be referred for COVID treatment.

## 2020-06-28 ENCOUNTER — Telehealth: Payer: Self-pay | Admitting: *Deleted

## 2020-06-28 ENCOUNTER — Telehealth: Payer: Self-pay | Admitting: Adult Health

## 2020-06-28 NOTE — Telephone Encounter (Signed)
No 2/10 los. No changes made to pt's schedule.

## 2020-06-28 NOTE — Telephone Encounter (Signed)
Per Annabelle Harman, made pt aware COVID test was negative. Pt verbalized understanding.

## 2020-07-08 ENCOUNTER — Telehealth: Payer: Self-pay

## 2020-07-08 NOTE — Assessment & Plan Note (Signed)
03/28/2020:Patient palpated a right breast mass x2 wks. Mammogram and US showed a 2.0cm mass at the 10 o'clock position in the right breast and two enlarged right axillary lymph nodes, 2.0cm and 1.7cm. 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 Satellite lesion biopsy: DCIS ER 0%, PR 0%  Treatment plan: 1. Neoadjuvant chemotherapy with TCH Perjeta 6 cycles followed by HerceptinPerjeta versus Kadcylamaintenance for 1 year 2. Followed by breast conserving surgerywith targeted node dissection 3. Followed by adjuvant radiation therapy if patient had lumpectomy ----------------------------------------------------------------------------------------------------------------------------------------- Current treatment: Cycle5TCH Perjeta Echocardiogram 04/09/2020: EF 60 to 65%  Chemo toxicities: 1.Severe fatigue 2.diarrhea: Gets 4-5 loose stools per day.  This is in spite of Imodium and Lomotil. 3.chemo induced anemia: Monitoring closely today's hemoglobin 11.1  We decided to remove pregnancy tests with each treatment based on patient's wishes. Lengthy discussion today about the number of cycles of chemotherapy.   Return to clinic in3weeks for cycle6

## 2020-07-08 NOTE — Progress Notes (Signed)
Patient Care Team: [name], MD as PCP - General (Internal Medicine) Rockwell Germany, RN as Oncology Nurse Navigator Mauro Kaufmann, RN as Oncology Nurse Navigator Coralie Keens, MD as Consulting Physician (General Surgery) Nicholas Lose, MD as Consulting Physician (Hematology and Oncology) [name], MD as Consulting Physician (Radiation Oncology)  DIAGNOSIS:    ICD-10-CM   1. Malignant neoplasm of upper-outer quadrant of right breast in female, estrogen receptor negative ([place])  C50.411    Z17.1     SUMMARY OF ONCOLOGIC HISTORY: Oncology History  Malignant neoplasm of upper-outer quadrant of right breast in female, estrogen receptor negative ([place])  03/28/2020 Initial Diagnosis   Patient palpated a right breast mass x2 wks. Mammogram and US showed a 2.0cm mass at the 10 o'clock position in the right breast and two enlarged right axillary lymph nodes, 2.0cm and 1.7cm. 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   04/16/2020 -  Chemotherapy    Patient is on Treatment Plan: BREAST  DOCETAXEL + CARBOPLATIN + TRASTUZUMAB + PERTUZUMAB  (TCHP) Q21D       04/18/2020 Genetic Testing   Negative genetic testing: no pathogenic variants detected in [facility] Multi-Cancer Panel.  Variants of uncertain significance detected in CDH1 at c.1289T>G (p.Val430Gly) and EGFR at c.2873G>A (p.Arg958His).  The report date is April 18, 2020.   The Multi-Cancer Panel offered by [facility] includes sequencing and/or deletion duplication testing of the following 85 genes: AIP, ALK, APC, ATM, AXIN2,BAP1,  BARD1, BLM, BMPR1A, BRCA1, BRCA2, BRIP1, CASR, CDC73, CDH1, CDK4, CDKN1B, CDKN1C, CDKN2A (p14ARF), CDKN2A (p16INK4a), CEBPA, CHEK2, CTNNA1, DICER1, DIS3L2, EGFR, EPCAM (Deletion/duplication testing only), FH, FLCN, GATA2, GPC3, GREM1 (Promoter region deletion/duplication testing only), HOXB13 (c.251G>A, p.Gly84Glu), HRAS, KIT, MAX, MEN1,  MET, MITF (c.952G>A, p.Glu318Lys variant only), MLH1, MSH2, MSH3, MSH6, MUTYH, NBN, NF1, NF2, NTHL1, PALB2, PDGFRA, PHOX2B, PMS2, POLD1, POLE, POT1, PRKAR1A, PTCH1, PTEN, RAD50, RAD51C, RAD51D, RB1, RECQL4, RET, RNF43, RUNX1, SDHAF2, SDHA (sequence changes only), SDHB, SDHC, SDHD, SMAD4, SMARCA4, SMARCB1, SMARCE1, STK11, SUFU, TERC, TERT, TMEM127, TP53, TSC1, TSC2, VHL, WRN and WT1.      CHIEF COMPLIANT: Cycle5TCH Perjeta  INTERVAL HISTORY: Shannon Kane is a 49 y.o. with above-mentioned history of right breast cancer currently on neoadjuvant chemotherapy with [place].She presents to the clinic todayfor treatment.  After the last treatment she got fairly sick with a profound diarrhea 7-10 times per day.  In spite of Imodium and Lomotil.  All of this went away after a week.  She also had sores in the mouth.  Neuropathy in the tips of the fingers.     ALLERGIES:  is allergic to amoxicillin, ciprofloxacin, and penicillins.  MEDICATIONS:  Current Outpatient Medications  Medication Sig Dispense Refill  . Cholecalciferol (VITAMIN D-3 PO) Take 6,000 Units by mouth daily.     . clotrimazole-betamethasone (LOTRISONE) cream clotrimazole-betamethasone 1 %-0.05 % topical cream  APPLY TO AFFECTED AREA TWICE A DAY MORNING AND EVENING FOR 2 WEEKS    . dexamethasone (DECADRON) 4 MG tablet Take 1 tablet (4 mg total) by mouth daily. 1 tab day before chemo and 1 tab day after chemo 12 tablet 0  . diphenoxylate-atropine (LOMOTIL) 2.5-0.025 MG tablet Take 1 tablet by mouth 4 (four) times daily as needed for diarrhea or loose stools. 30 tablet 2  . lidocaine-prilocaine (EMLA) cream Apply to affected area once 30 g 3  . lisinopril (ZESTRIL) 20 MG tablet TAKE 1 TABLET DAILY 90  tablet 3  . LORazepam (ATIVAN) 0.5 MG tablet Take 1 tablet (0.5 mg total) by mouth at bedtime as needed for sleep. 30 tablet 0  . MAGNESIUM CITRATE PO Take 500 mcg by mouth. Only when having trouble sleeping    . ondansetron (ZOFRAN) 8 MG  tablet Take 1 tablet (8 mg total) by mouth 2 (two) times daily as needed (Nausea or vomiting). Start on the third day after chemotherapy. 30 tablet 1  . pantoprazole (PROTONIX) 20 MG tablet Take 1 tablet (20 mg total) by mouth daily. 30 tablet 2  . potassium chloride (KLOR-CON) 10 MEQ tablet Take 1 tablet (10 mEq total) by mouth daily. 30 tablet 1  . prochlorperazine (COMPAZINE) 10 MG tablet Take 1 tablet (10 mg total) by mouth every 6 (six) hours as needed (Nausea or vomiting). 30 tablet 1  . SYNTHROID 75 MCG tablet TAKE 1 TABLET ON MONDAY THROUGH SUNDAY 90 tablet 3  . triamcinolone (KENALOG) 0.1 % triamcinolone acetonide 0.1 % topical cream  APPLY 2 TIMES EVERY DAY A THIN LAYER TO THE AFFECTED AREA(S)     No current facility-administered medications for this visit.    PHYSICAL EXAMINATION: ECOG PERFORMANCE STATUS: 1 - Symptomatic but completely ambulatory  There were no vitals filed for this visit. There were no vitals filed for this visit.   LABORATORY DATA:  I have reviewed the data as listed CMP Latest Ref Rng & Units 06/18/2020 05/28/2020 05/07/2020  Glucose 70 - 99 mg/dL 95 108(H) 102(H)  BUN 6 - 20 mg/dL 8 11 13   Creatinine 0.44 - 1.00 mg/dL 0.76 0.78 0.76  Sodium 135 - 145 mmol/L 141 142 139  Potassium 3.5 - 5.1 mmol/L 3.0(L) 3.7 3.7  Chloride 98 - 111 mmol/L 106 108 106  CO2 22 - 32 mmol/L 29 25 24   Calcium 8.9 - 10.3 mg/dL 9.0 9.4 9.0  Total Protein 6.5 - 8.1 g/dL 6.9 7.0 7.4  Total Bilirubin 0.3 - 1.2 mg/dL 0.6 0.6 0.4  Alkaline Phos 38 - 126 U/L 77 83 88  AST 15 - 41 U/L 20 28 18   ALT 0 - 44 U/L 16 26 17     Lab Results  Component Value Date   WBC 8.0 07/09/2020   HGB 10.7 (L) 07/09/2020   HCT 32.0 (L) 07/09/2020   MCV 97.6 07/09/2020   PLT 211 07/09/2020   NEUTROABS 5.0 07/09/2020    ASSESSMENT & PLAN:  Malignant neoplasm of upper-outer quadrant of right breast in female, estrogen receptor negative ([place]) 03/28/2020:Patient palpated a right breast mass x2 wks.  Mammogram and US showed a 2.0cm mass at the 10 o'clock position in the right breast and two enlarged right axillary lymph nodes, 2.0cm and 1.7cm. 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 Satellite lesion biopsy: DCIS ER 0%, PR 0%  Treatment plan: 1. Neoadjuvant chemotherapy with TCH Perjeta 6 cycles followed by HerceptinPerjeta versus Kadcylamaintenance for 1 year 2. Followed by breast conserving surgerywith targeted node dissection 3. Followed by adjuvant radiation therapy if patient had lumpectomy ----------------------------------------------------------------------------------------------------------------------------------------- Current treatment: Cycle5TCH Perjeta (Perjeta being discontinued today) Echocardiogram 04/09/2020: EF 60 to 65%  Chemo toxicities: 1.Severe fatigue 2.diarrhea: Gets 4-5 loose stools per day.  This is in spite of Imodium and Lomotil.  Stopped Perjeta today. 3.chemo induced anemia: Monitoring closely today's hemoglobin 10.7 4.  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 I sent a message for her to be scheduled with surgery after her last chemo and MRI.  (Dr. [name]) Return to clinic in3weeks for cycle6     No orders of the defined types were placed in this encounter.  The patient has a good understanding of the overall plan. she agrees with it. she will call with any problems that may develop before the next visit here.  Total time spent: 30 mins including face to face time and time spent for planning, charting and coordination of care  Rulon Eisenmenger, MD, MPH 07/09/2020  I, Cloyde Reams Dorshimer, am acting as scribe for Dr. Nicholas Lose.  I have reviewed the above documentation for accuracy and completeness, and I agree with the above.

## 2020-07-08 NOTE — Telephone Encounter (Signed)
The pt said she was returning a call to the office, that when she answered the phone hung up and she was making sure it wasn't about the mychart message she sent.  I told the pt that it was an appt reminder and that if she doesn't get a response about if she needed to have blood work done to let me know.

## 2020-07-09 ENCOUNTER — Inpatient Hospital Stay

## 2020-07-09 ENCOUNTER — Other Ambulatory Visit: Payer: Self-pay | Admitting: *Deleted

## 2020-07-09 ENCOUNTER — Encounter: Payer: Self-pay | Admitting: *Deleted

## 2020-07-09 ENCOUNTER — Inpatient Hospital Stay (HOSPITAL_BASED_OUTPATIENT_CLINIC_OR_DEPARTMENT_OTHER): Admitting: Hematology and Oncology

## 2020-07-09 ENCOUNTER — Other Ambulatory Visit: Payer: Self-pay

## 2020-07-09 VITALS — BP 144/93 | HR 90 | Temp 97.9°F | Resp 18 | Ht 68.0 in | Wt 191.8 lb

## 2020-07-09 DIAGNOSIS — Z5111 Encounter for antineoplastic chemotherapy: Secondary | ICD-10-CM | POA: Diagnosis not present

## 2020-07-09 DIAGNOSIS — Z171 Estrogen receptor negative status [ER-]: Secondary | ICD-10-CM

## 2020-07-09 DIAGNOSIS — C50411 Malignant neoplasm of upper-outer quadrant of right female breast: Secondary | ICD-10-CM | POA: Diagnosis not present

## 2020-07-09 DIAGNOSIS — Z0000 Encounter for general adult medical examination without abnormal findings: Secondary | ICD-10-CM | POA: Diagnosis not present

## 2020-07-09 DIAGNOSIS — Z95828 Presence of other vascular implants and grafts: Secondary | ICD-10-CM

## 2020-07-09 LAB — CBC WITH DIFFERENTIAL (CANCER CENTER ONLY)
Abs Immature Granulocytes: 0.04 10*3/uL (ref 0.00–0.07)
Basophils Absolute: 0.1 10*3/uL (ref 0.0–0.1)
Basophils Relative: 1 %
Eosinophils Absolute: 0 10*3/uL (ref 0.0–0.5)
Eosinophils Relative: 0 %
HCT: 32 % — ABNORMAL LOW (ref 36.0–46.0)
Hemoglobin: 10.7 g/dL — ABNORMAL LOW (ref 12.0–15.0)
Immature Granulocytes: 1 %
Lymphocytes Relative: 22 %
Lymphs Abs: 1.8 10*3/uL (ref 0.7–4.0)
MCH: 32.6 pg (ref 26.0–34.0)
MCHC: 33.4 g/dL (ref 30.0–36.0)
MCV: 97.6 fL (ref 80.0–100.0)
Monocytes Absolute: 1.1 10*3/uL — ABNORMAL HIGH (ref 0.1–1.0)
Monocytes Relative: 14 %
Neutro Abs: 5 10*3/uL (ref 1.7–7.7)
Neutrophils Relative %: 62 %
Platelet Count: 211 10*3/uL (ref 150–400)
RBC: 3.28 MIL/uL — ABNORMAL LOW (ref 3.87–5.11)
RDW: 17.7 % — ABNORMAL HIGH (ref 11.5–15.5)
WBC Count: 8 10*3/uL (ref 4.0–10.5)
nRBC: 0 % (ref 0.0–0.2)

## 2020-07-09 LAB — CMP (CANCER CENTER ONLY)
ALT: 26 U/L (ref 0–44)
AST: 29 U/L (ref 15–41)
Albumin: 3.6 g/dL (ref 3.5–5.0)
Alkaline Phosphatase: 70 U/L (ref 38–126)
Anion gap: 7 (ref 5–15)
BUN: 10 mg/dL (ref 6–20)
CO2: 27 mmol/L (ref 22–32)
Calcium: 8.5 mg/dL — ABNORMAL LOW (ref 8.9–10.3)
Chloride: 108 mmol/L (ref 98–111)
Creatinine: 0.71 mg/dL (ref 0.44–1.00)
GFR, Estimated: >60 mL/min (ref >60)
Glucose, Bld: 94 mg/dL (ref 70–99)
Potassium: 3.2 mmol/L — ABNORMAL LOW (ref 3.5–5.1)
Sodium: 142 mmol/L (ref 135–145)
Total Bilirubin: 0.4 mg/dL (ref 0.3–1.2)
Total Protein: 6.3 g/dL — ABNORMAL LOW (ref 6.5–8.1)

## 2020-07-09 LAB — LIPID PANEL
Cholesterol: 163 mg/dL (ref 0–200)
HDL: 48 mg/dL (ref >40)
LDL Cholesterol: 101 mg/dL — ABNORMAL HIGH (ref 0–99)
Total CHOL/HDL Ratio: 3.4 RATIO
Triglycerides: 72 mg/dL (ref <150)
VLDL: 14 mg/dL (ref 0–40)

## 2020-07-09 MED ORDER — TRASTUZUMAB-DKST CHEMO 150 MG IV SOLR
6.0000 mg/kg | Freq: Once | INTRAVENOUS | Status: AC
  Administered 2020-07-09: 483 mg via INTRAVENOUS
  Filled 2020-07-09: qty 23

## 2020-07-09 MED ORDER — SODIUM CHLORIDE 0.9 % IV SOLN
700.0000 mg | Freq: Once | INTRAVENOUS | Status: AC
  Administered 2020-07-09: 700 mg via INTRAVENOUS
  Filled 2020-07-09: qty 70

## 2020-07-09 MED ORDER — PALONOSETRON HCL INJECTION 0.25 MG/5ML
0.2500 mg | Freq: Once | INTRAVENOUS | Status: AC
  Administered 2020-07-09: 0.25 mg via INTRAVENOUS

## 2020-07-09 MED ORDER — ACETAMINOPHEN 325 MG PO TABS
650.0000 mg | ORAL_TABLET | Freq: Once | ORAL | Status: AC
  Administered 2020-07-09: 650 mg via ORAL

## 2020-07-09 MED ORDER — ACETAMINOPHEN 325 MG PO TABS
ORAL_TABLET | ORAL | Status: AC
  Filled 2020-07-09: qty 2

## 2020-07-09 MED ORDER — DIPHENHYDRAMINE HCL 25 MG PO CAPS
ORAL_CAPSULE | ORAL | Status: AC
  Filled 2020-07-09: qty 1

## 2020-07-09 MED ORDER — SODIUM CHLORIDE 0.9 % IV SOLN
Freq: Once | INTRAVENOUS | Status: AC
  Filled 2020-07-09: qty 250

## 2020-07-09 MED ORDER — SODIUM CHLORIDE 0.9 % IV SOLN
65.0000 mg/m2 | Freq: Once | INTRAVENOUS | Status: AC
  Administered 2020-07-09: 130 mg via INTRAVENOUS
  Filled 2020-07-09: qty 13

## 2020-07-09 MED ORDER — SODIUM CHLORIDE 0.9% FLUSH
10.0000 mL | Freq: Once | INTRAVENOUS | Status: AC
Start: 2020-07-09 — End: 2020-07-09
  Administered 2020-07-09: 10 mL
  Filled 2020-07-09: qty 10

## 2020-07-09 MED ORDER — SODIUM CHLORIDE 0.9% FLUSH
10.0000 mL | INTRAVENOUS | Status: DC | PRN
  Administered 2020-07-09: 10 mL
  Filled 2020-07-09: qty 10

## 2020-07-09 MED ORDER — DIPHENHYDRAMINE HCL 25 MG PO CAPS
25.0000 mg | ORAL_CAPSULE | Freq: Once | ORAL | Status: AC
  Administered 2020-07-09: 25 mg via ORAL

## 2020-07-09 MED ORDER — SODIUM CHLORIDE 0.9 % IV SOLN
10.0000 mg | Freq: Once | INTRAVENOUS | Status: AC
  Administered 2020-07-09: 10 mg via INTRAVENOUS
  Filled 2020-07-09: qty 10

## 2020-07-09 MED ORDER — PALONOSETRON HCL INJECTION 0.25 MG/5ML
INTRAVENOUS | Status: AC
  Filled 2020-07-09: qty 5

## 2020-07-09 MED ORDER — SODIUM CHLORIDE 0.9 % IV SOLN
150.0000 mg | Freq: Once | INTRAVENOUS | Status: AC
  Administered 2020-07-09: 150 mg via INTRAVENOUS
  Filled 2020-07-09: qty 150

## 2020-07-09 MED ORDER — HEPARIN SOD (PORK) LOCK FLUSH 100 UNIT/ML IV SOLN
500.0000 [IU] | Freq: Once | INTRAVENOUS | Status: AC | PRN
  Administered 2020-07-09: 500 [IU]
  Filled 2020-07-09: qty 5

## 2020-07-09 NOTE — Patient Instructions (Signed)

## 2020-07-09 NOTE — Patient Instructions (Signed)
[facility] Discharge Instructions for Patients Receiving Chemotherapy  Today you received the following chemotherapy agents Traztuzumab, Docetaxel, Carboplatin  To help prevent nausea and vomiting after your treatment, we encourage you to take your nausea medication as directed.   If you develop nausea and vomiting that is not controlled by your nausea medication, call the clinic.   BELOW ARE SYMPTOMS THAT SHOULD BE REPORTED IMMEDIATELY:  *FEVER GREATER THAN 100.5 F  *CHILLS WITH OR WITHOUT FEVER  NAUSEA AND VOMITING THAT IS NOT CONTROLLED WITH YOUR NAUSEA MEDICATION  *UNUSUAL SHORTNESS OF BREATH  *UNUSUAL BRUISING OR BLEEDING  TENDERNESS IN MOUTH AND THROAT WITH OR WITHOUT PRESENCE OF ULCERS  *URINARY PROBLEMS  *BOWEL PROBLEMS  UNUSUAL RASH Items with * indicate a potential emergency and should be followed up as soon as possible.  Feel free to call the clinic should you have any questions or concerns. The clinic phone number is [phone].  Please show the CHEMO ALERT CARD at check-in to the Emergency Department and triage nurse.   

## 2020-07-09 NOTE — Progress Notes (Signed)
Nutrition Follow-up:  Patient with right breast cancer.  Patient receiving neoadjuvant chemotherapy.  Noted per MD note planning to stop perjeta today.  Met with patient in infusion.  Patient reports that appetite is good and that she has not lost any weight.  Reports that if she eats less on some days will make up for it when appetite is good.  Noted increased diarrhea for 1 week after treatment despite imodium and lomotil.      Medications: reviewed  Labs: K 3.2  Anthropometrics:   Weight 191 lb today  184 lb on 12/21 178 lb on 11/19   NUTRITION DIAGNOSIS: Food and nutrition related knowledge deficit improved   INTERVENTION:  Encouraged patient to continue good sources of protein in diet Reviewed foods to help increase K level.   Patient has RD contact information and RD is available if needed    MONITORING, EVALUATION, GOAL: weight trends, intake   NEXT VISIT:  No follow-up as weight increased, appetite good  Shannon Kane, [place], [place] Registered Dietitian [phone] (mobile)

## 2020-07-10 ENCOUNTER — Encounter: Payer: Self-pay | Admitting: *Deleted

## 2020-07-10 ENCOUNTER — Ambulatory Visit (INDEPENDENT_AMBULATORY_CARE_PROVIDER_SITE_OTHER): Admitting: Internal Medicine

## 2020-07-10 ENCOUNTER — Telehealth: Payer: Self-pay | Admitting: Hematology and Oncology

## 2020-07-10 ENCOUNTER — Encounter: Payer: Self-pay | Admitting: Hematology and Oncology

## 2020-07-10 ENCOUNTER — Other Ambulatory Visit: Payer: Self-pay

## 2020-07-10 ENCOUNTER — Encounter: Payer: Self-pay | Admitting: Internal Medicine

## 2020-07-10 VITALS — BP 136/92 | Temp 98.2°F | Ht 68.4 in | Wt 189.0 lb

## 2020-07-10 DIAGNOSIS — E039 Hypothyroidism, unspecified: Secondary | ICD-10-CM | POA: Diagnosis not present

## 2020-07-10 DIAGNOSIS — Z171 Estrogen receptor negative status [ER-]: Secondary | ICD-10-CM

## 2020-07-10 DIAGNOSIS — Z0000 Encounter for general adult medical examination without abnormal findings: Secondary | ICD-10-CM | POA: Diagnosis not present

## 2020-07-10 DIAGNOSIS — I10 Essential (primary) hypertension: Secondary | ICD-10-CM

## 2020-07-10 DIAGNOSIS — C50411 Malignant neoplasm of upper-outer quadrant of right female breast: Secondary | ICD-10-CM

## 2020-07-10 DIAGNOSIS — E663 Overweight: Secondary | ICD-10-CM

## 2020-07-10 LAB — POCT URINALYSIS DIPSTICK
Bilirubin, UA: NEGATIVE
Glucose, UA: NEGATIVE
Ketones, UA: NEGATIVE
Leukocytes, UA: NEGATIVE
Nitrite, UA: NEGATIVE
Protein, UA: NEGATIVE
Spec Grav, UA: 1.025 (ref 1.010–1.025)
Urobilinogen, UA: 0.2 E.U./dL
pH, UA: 7 (ref 5.0–8.0)

## 2020-07-10 LAB — POCT UA - MICROALBUMIN
Creatinine, POC: 200 mg/dL
Microalbumin Ur, POC: 80 mg/L

## 2020-07-10 LAB — THYROID PANEL WITH TSH
Free Thyroxine Index: 1.5 (ref 1.2–4.9)
T3 Uptake Ratio: 23 % — ABNORMAL LOW (ref 24–39)
T4, Total: 6.7 ug/dL (ref 4.5–12.0)
TSH: 2.64 u[IU]/mL (ref 0.450–4.500)

## 2020-07-10 MED ORDER — LISINOPRIL 20 MG PO TABS: ORAL_TABLET | ORAL | 3 refills | Status: DC

## 2020-07-10 NOTE — Patient Instructions (Signed)
Health Maintenance, Female Adopting a healthy lifestyle and getting preventive care are important in promoting health and wellness. Ask your health care provider about:  The right schedule for you to have regular tests and exams.  Things you can do on your own to prevent diseases and keep yourself healthy. What should I know about diet, weight, and exercise? Eat a healthy diet  Eat a diet that includes plenty of vegetables, fruits, low-fat dairy products, and lean protein.  Do not eat a lot of foods that are high in solid fats, added sugars, or sodium.   Maintain a healthy weight Body mass index (BMI) is used to identify weight problems. It estimates body fat based on height and weight. Your health care provider can help determine your BMI and help you achieve or maintain a healthy weight. Get regular exercise Get regular exercise. This is one of the most important things you can do for your health. Most adults should:  Exercise for at least 150 minutes each week. The exercise should increase your heart rate and make you sweat (moderate-intensity exercise).  Do strengthening exercises at least twice a week. This is in addition to the moderate-intensity exercise.  Spend less time sitting. Even light physical activity can be beneficial. Watch cholesterol and blood lipids Have your blood tested for lipids and cholesterol at 52 years of age, then have this test every 5 years. Have your cholesterol levels checked more often if:  Your lipid or cholesterol levels are high.  You are older than 52 years of age.  You are at high risk for heart disease. What should I know about cancer screening? Depending on your health history and family history, you may need to have cancer screening at various ages. This may include screening for:  Breast cancer.  Cervical cancer.  Colorectal cancer.  Skin cancer.  Lung cancer. What should I know about heart disease, diabetes, and high blood  pressure? Blood pressure and heart disease  High blood pressure causes heart disease and increases the risk of stroke. This is more likely to develop in people who have high blood pressure readings, are of African descent, or are overweight.  Have your blood pressure checked: ? Every 3-5 years if you are 18-39 years of age. ? Every year if you are 40 years old or older. Diabetes Have regular diabetes screenings. This checks your fasting blood sugar level. Have the screening done:  Once every three years after age 40 if you are at a normal weight and have a low risk for diabetes.  More often and at a younger age if you are overweight or have a high risk for diabetes. What should I know about preventing infection? Hepatitis B If you have a higher risk for hepatitis B, you should be screened for this virus. Talk with your health care provider to find out if you are at risk for hepatitis B infection. Hepatitis C Testing is recommended for:  Everyone born from 1945 through 1965.  Anyone with known risk factors for hepatitis C. Sexually transmitted infections (STIs)  Get screened for STIs, including gonorrhea and chlamydia, if: ? You are sexually active and are younger than 52 years of age. ? You are older than 52 years of age and your health care provider tells you that you are at risk for this type of infection. ? Your sexual activity has changed since you were last screened, and you are at increased risk for chlamydia or gonorrhea. Ask your health care provider   if you are at risk.  Ask your health care provider about whether you are at high risk for HIV. Your health care provider may recommend a prescription medicine to help prevent HIV infection. If you choose to take medicine to prevent HIV, you should first get tested for HIV. You should then be tested every 3 months for as long as you are taking the medicine. Pregnancy  If you are about to stop having your period (premenopausal) and  you may become pregnant, seek counseling before you get pregnant.  Take 400 to 800 micrograms (mcg) of folic acid every day if you become pregnant.  Ask for birth control (contraception) if you want to prevent pregnancy. Osteoporosis and menopause Osteoporosis is a disease in which the bones lose minerals and strength with aging. This can result in bone fractures. If you are 65 years old or older, or if you are at risk for osteoporosis and fractures, ask your health care provider if you should:  Be screened for bone loss.  Take a calcium or vitamin D supplement to lower your risk of fractures.  Be given hormone replacement therapy (HRT) to treat symptoms of menopause. Follow these instructions at home: Lifestyle  Do not use any products that contain nicotine or tobacco, such as cigarettes, e-cigarettes, and chewing tobacco. If you need help quitting, ask your health care provider.  Do not use street drugs.  Do not share needles.  Ask your health care provider for help if you need support or information about quitting drugs. Alcohol use  Do not drink alcohol if: ? Your health care provider tells you not to drink. ? You are pregnant, may be pregnant, or are planning to become pregnant.  If you drink alcohol: ? Limit how much you use to 0-1 drink a day. ? Limit intake if you are breastfeeding.  Be aware of how much alcohol is in your drink. In the [place] one drink equals one 12 oz bottle of beer (355 mL), one 5 oz glass of wine (148 mL), or one 1 oz glass of hard liquor (44 mL). General instructions  Schedule regular health, dental, and eye exams.  Stay current with your vaccines.  Tell your health care provider if: ? You often feel depressed. ? You have ever been abused or do not feel safe at home. Summary  Adopting a healthy lifestyle and getting preventive care are important in promoting health and wellness.  Follow your health care provider's instructions about healthy  diet, exercising, and getting tested or screened for diseases.  Follow your health care provider's instructions on monitoring your cholesterol and blood pressure. This information is not intended to replace advice given to you by your health care provider. Make sure you discuss any questions you have with your health care provider. Document Revised: 04/27/2018 Document Reviewed: 04/27/2018 Elsevier Patient Education  2021 [employer]  

## 2020-07-10 NOTE — Progress Notes (Signed)
Rutherford Nail as a scribe for [name], MD.,have documented all relevant documentation on the behalf of [name], MD,as directed by  [name], MD while in the presence of [name], MD. This visit occurred during the SARS-CoV-2 public health emergency.  Safety protocols were in place, including screening questions prior to the visit, additional usage of staff PPE, and extensive cleaning of exam room while observing appropriate contact time as indicated for disinfecting solutions.  Subjective:     Patient ID: Shannon Kane , female    DOB: 15-Feb-1971 , 50 y.o.   MRN: [id number]   Chief Complaint  Patient presents with  . Annual Exam  . Hypertension    HPI  She is here today for a full physical examination.  She is followed by  Dr. [name] for her pelvic exams. Her last visit was October 2021. Since her last visit, she has been diagnosed with malignant right breast cancer.  Biopsy showed IDC in the breast and axilla, grade 2, HER-2 positive (3+), ER+ 10% weak, PR- 0%, Ki67 30%.She is currently undergoing chemotherapy. She has one more cycle, then she will be scheduled for lumpectomy and lymph node dissection.  Hypertension This is a chronic problem. The current episode started more than 1 year ago. The problem has been gradually improving since onset. The problem is controlled. Pertinent negatives include no blurred vision, chest pain, headaches, palpitations or shortness of breath. Past treatments include angiotensin blockers. The current treatment provides moderate improvement. There are no compliance problems.      Past Medical History:  Diagnosis Date  . Abnormal glucose   . Breast cancer ([place]) 03/2020   right breast IDC  . Family history of breast cancer 04/03/2020  . Family history of ovarian cancer 04/03/2020  . Hypertension   . Hypothyroidism   . Iron deficiency anemia   . Palpitation   . Spontaneous ecchymoses   . Tachycardia   . Vitamin D  deficiency      Family History  Problem Relation Age of Onset  . Diabetes Mother   . Hypertension Mother   . Leukemia Father        dx > 50  . Ovarian cancer Maternal Grandmother 78  . Bone cancer Paternal Grandfather        dx unknown age  . Cancer Maternal Uncle        Mouth and throad; dx > 50  . Breast cancer Other        MGM's niece, dx unknown age  . Cancer Maternal Uncle        unknown type; dx > 50     Current Outpatient Medications:  .  Cholecalciferol (VITAMIN D-3 PO), Take 6,000 Units by mouth daily. , Disp: , Rfl:  .  dexamethasone (DECADRON) 4 MG tablet, Take 1 tablet (4 mg total) by mouth daily. 1 tab day before chemo and 1 tab day after chemo, Disp: 12 tablet, Rfl: 0 .  diphenoxylate-atropine (LOMOTIL) 2.5-0.025 MG tablet, Take 1 tablet by mouth 4 (four) times daily as needed for diarrhea or loose stools., Disp: 30 tablet, Rfl: 2 .  lidocaine-prilocaine (EMLA) cream, Apply to affected area once, Disp: 30 g, Rfl: 3 .  MAGNESIUM CITRATE PO, Take 500 mcg by mouth. Only when having trouble sleeping, Disp: , Rfl:  .  potassium chloride (KLOR-CON) 10 MEQ tablet, Take 1 tablet (10 mEq total) by mouth daily., Disp: 30 tablet, Rfl: 1 .  SYNTHROID 75 MCG tablet,  TAKE 1 TABLET ON MONDAY THROUGH SUNDAY, Disp: 90 tablet, Rfl: 3 .  clotrimazole-betamethasone (LOTRISONE) cream, clotrimazole-betamethasone 1 %-0.05 % topical cream  APPLY TO AFFECTED AREA TWICE A DAY MORNING AND EVENING FOR 2 WEEKS (Patient not taking: Reported on 07/10/2020), Disp: , Rfl:  .  lisinopril (ZESTRIL) 20 MG tablet, TAKE 1 TABLET DAILY, Disp: 90 tablet, Rfl: 3 .  LORazepam (ATIVAN) 0.5 MG tablet, Take 1 tablet (0.5 mg total) by mouth at bedtime as needed for sleep. (Patient not taking: Reported on 07/10/2020), Disp: 30 tablet, Rfl: 0 .  ondansetron (ZOFRAN) 8 MG tablet, Take 1 tablet (8 mg total) by mouth 2 (two) times daily as needed (Nausea or vomiting). Start on the third day after chemotherapy. (Patient not  taking: Reported on 07/10/2020), Disp: 30 tablet, Rfl: 1 .  pantoprazole (PROTONIX) 20 MG tablet, Take 1 tablet (20 mg total) by mouth daily. (Patient not taking: Reported on 07/10/2020), Disp: 30 tablet, Rfl: 2 .  prochlorperazine (COMPAZINE) 10 MG tablet, Take 1 tablet (10 mg total) by mouth every 6 (six) hours as needed (Nausea or vomiting). (Patient not taking: Reported on 07/10/2020), Disp: 30 tablet, Rfl: 1 .  triamcinolone (KENALOG) 0.1 %, triamcinolone acetonide 0.1 % topical cream  APPLY 2 TIMES EVERY DAY A THIN LAYER TO THE AFFECTED AREA(S) (Patient not taking: Reported on 07/10/2020), Disp: , Rfl:    Allergies  Allergen Reactions  . Amoxicillin     Tolerates ANCEF  . Ciprofloxacin Itching  . Penicillins Hives    Tolerates ANCEF       The patient states she uses none for birth control. Last LMP was No LMP recorded. (Menstrual status: Irregular Periods).. Negative for Dysmenorrhea. Negative for: breast discharge, breast lump(s), breast pain and breast self exam. Associated symptoms include abnormal vaginal bleeding. Pertinent negatives include abnormal bleeding (hematology), anxiety, decreased libido, depression, difficulty falling sleep, dyspareunia, history of infertility, nocturia, sexual dysfunction, sleep disturbances, urinary incontinence, urinary urgency, vaginal discharge and vaginal itching. Diet regular.The patient states her exercise level is  moderate.  . The patient's tobacco use is:  Social History   Tobacco Use  Smoking Status Never Smoker  Smokeless Tobacco Never Used  . She has been exposed to passive smoke. The patient's alcohol use is:  Social History   Substance and Sexual Activity  Alcohol Use Yes  . Alcohol/week: 0.0 standard drinks   Comment: social   Review of Systems  Constitutional: Negative.  Negative for fatigue.  HENT: Negative.   Eyes: Negative.  Negative for blurred vision.  Respiratory: Negative.  Negative for shortness of breath.        Port  left anterior chest  Cardiovascular: Negative.  Negative for chest pain and palpitations.  Gastrointestinal: Negative.   Endocrine: Negative.  Negative for polydipsia, polyphagia and polyuria.  Genitourinary: Negative.   Musculoskeletal: Negative.   Skin: Negative.   Allergic/Immunologic: Negative.   Neurological: Negative.  Negative for dizziness and headaches.  Hematological: Negative.   Psychiatric/Behavioral: Negative.      Today's Vitals   07/10/20 0940  BP: (!) 136/92  Temp: 98.2 F (36.8 C)  Weight: 189 lb (85.7 kg)  Height: 5' 8.4" (1.737 m)   Body mass index is 28.4 kg/m.  Wt Readings from Last 3 Encounters:  07/10/20 189 lb (85.7 kg)  07/09/20 191 lb 12.8 oz (87 kg)  06/27/20 187 lb 8 oz (85 kg)   Objective:  Physical Exam Vitals and nursing note reviewed.  Constitutional:  General: She is not in acute distress.    Appearance: Normal appearance. She is obese.  HENT:     Head: Normocephalic and atraumatic.     Right Ear: Tympanic membrane, ear canal and external ear normal. There is no impacted cerumen.     Left Ear: Tympanic membrane, ear canal and external ear normal. There is no impacted cerumen.     Nose:     Comments: Masked     Mouth/Throat:     Comments: Masked  Eyes:     Extraocular Movements: Extraocular movements intact.     Conjunctiva/sclera: Conjunctivae normal.     Pupils: Pupils are equal, round, and reactive to light.  Cardiovascular:     Rate and Rhythm: Normal rate and regular rhythm.     Pulses: Normal pulses.     Heart sounds: Normal heart sounds. No murmur heard.   Pulmonary:     Effort: Pulmonary effort is normal. No respiratory distress.     Breath sounds: Normal breath sounds. No wheezing.  Chest:  Breasts:     Tanner Score is 5.     Right: Normal.     Left: Normal.      Comments: Healed scar r breast Abdominal:     General: Bowel sounds are normal.     Palpations: Abdomen is soft.  Genitourinary:    Comments:  Deferred  Musculoskeletal:        General: Normal range of motion.     Cervical back: Normal range of motion.  Skin:    General: Skin is warm and dry.     Capillary Refill: Capillary refill takes less than 2 seconds.     Comments: Her head is shaved.   Neurological:     General: No focal deficit present.     Mental Status: She is alert and oriented to person, place, and time.     Cranial Nerves: No cranial nerve deficit.  Psychiatric:        Mood and Affect: Mood normal.        Behavior: Behavior normal.        Thought Content: Thought content normal.        Judgment: Judgment normal.         Assessment And Plan:     1. Routine general medical examination at health care facility Comments: A full exam was performed. Importance of monthly self breast exams was discussed with the patient. Labs from [facility] reviewed in full detail. PATIENT IS ADVISED TO GET 30-45 MINUTES REGULAR EXERCISE NO LESS THAN FOUR TO FIVE DAYS PER WEEK - BOTH WEIGHTBEARING EXERCISES AND AEROBIC ARE RECOMMENDED.  PATIENT IS ADVISED TO FOLLOW A HEALTHY DIET WITH AT LEAST SIX FRUITS/VEGGIES PER DAY, DECREASE INTAKE OF RED MEAT, AND TO INCREASE FISH INTAKE TO TWO DAYS PER WEEK.  MEATS/FISH SHOULD NOT BE FRIED, BAKED OR BROILED IS PREFERABLE.  I SUGGEST WEARING SPF 50 SUNSCREEN ON EXPOSED PARTS AND ESPECIALLY WHEN IN THE DIRECT SUNLIGHT FOR AN EXTENDED PERIOD OF TIME.  PLEASE AVOID FAST FOOD RESTAURANTS AND INCREASE YOUR WATER INTAKE.  2. Essential hypertension, benign Comments: Chronic, uncontrolled. She will c/w current meds for now. EKG performed, NSR w/o acute changes. She will f/u in six months and advised to follow low sodium diet - POCT Urinalysis Dipstick (81002) - POCT UA - Microalbumin - EKG 12-Lead  3. Primary hypothyroidism Comments: Chronic. Labs from [facility] reviewed in full detail. She will c/w current dose of Synthroid.   4. Malignant  neoplasm of upper-outer quadrant of right breast in  female, estrogen receptor negative ([place]) Comments: She is currently undergoing chemo. She is scheduled for lumpectomy w/ lymph node dissection and then XRT. She is pleased w/ her care at [facility].   5. Overweight (BMI 25.0-29.9) Comments: BMI 28. Advised to gradually increase exercise as tolerated.   Patient was given opportunity to ask questions. Patient verbalized understanding of the plan and was able to repeat key elements of the plan. All questions were answered to their satisfaction.   I, [name], MD, have reviewed all documentation for this visit. The documentation on 07/10/20 for the exam, diagnosis, procedures, and orders are all accurate and complete.  THE PATIENT IS ENCOURAGED TO PRACTICE SOCIAL DISTANCING DUE TO THE COVID-19 PANDEMIC.

## 2020-07-10 NOTE — Telephone Encounter (Signed)
Scheduled appts per 2/22 los. Left voicemail with appt dates/times.

## 2020-07-11 ENCOUNTER — Inpatient Hospital Stay

## 2020-07-11 ENCOUNTER — Other Ambulatory Visit: Payer: Self-pay

## 2020-07-11 VITALS — BP 142/98 | HR 97 | Resp 18

## 2020-07-11 DIAGNOSIS — Z5111 Encounter for antineoplastic chemotherapy: Secondary | ICD-10-CM | POA: Diagnosis not present

## 2020-07-11 DIAGNOSIS — C50411 Malignant neoplasm of upper-outer quadrant of right female breast: Secondary | ICD-10-CM

## 2020-07-11 MED ORDER — PEGFILGRASTIM-CBQV 6 MG/0.6ML ~~LOC~~ SOSY
6.0000 mg | PREFILLED_SYRINGE | Freq: Once | SUBCUTANEOUS | Status: AC
  Administered 2020-07-11: 6 mg via SUBCUTANEOUS

## 2020-07-11 MED ORDER — PEGFILGRASTIM-JMDB 6 MG/0.6ML ~~LOC~~ SOSY: 6.0000 mg | PREFILLED_SYRINGE | Freq: Once | SUBCUTANEOUS | Status: DC

## 2020-07-11 MED ORDER — PEGFILGRASTIM-CBQV 6 MG/0.6ML ~~LOC~~ SOSY
PREFILLED_SYRINGE | SUBCUTANEOUS | Status: AC
  Filled 2020-07-11: qty 0.6

## 2020-07-11 MED ORDER — PEGFILGRASTIM-JMDB 6 MG/0.6ML ~~LOC~~ SOSY
PREFILLED_SYRINGE | SUBCUTANEOUS | Status: AC
  Filled 2020-07-11: qty 0.6

## 2020-07-11 NOTE — Patient Instructions (Signed)
Pegfilgrastim injection What is this medicine? PEGFILGRASTIM (PEG fil gra stim) is a long-acting granulocyte colony-stimulating factor that stimulates the growth of neutrophils, a type of white blood cell important in the body's fight against infection. It is used to reduce the incidence of fever and infection in patients with certain types of cancer who are receiving chemotherapy that affects the bone marrow, and to increase survival after being exposed to high doses of radiation. This medicine may be used for other purposes; ask your health care provider or pharmacist if you have questions. COMMON BRAND NAME(S): Fulphila, Neulasta, Nyvepria, UDENYCA, Ziextenzo What should I tell my health care provider before I take this medicine? They need to know if you have any of these conditions:  kidney disease  latex allergy  ongoing radiation therapy  sickle cell disease  skin reactions to acrylic adhesives (On-Body Injector only)  an unusual or allergic reaction to pegfilgrastim, filgrastim, other medicines, foods, dyes, or preservatives  pregnant or trying to get pregnant  breast-feeding How should I use this medicine? This medicine is for injection under the skin. If you get this medicine at home, you will be taught how to prepare and give the pre-filled syringe or how to use the On-body Injector. Refer to the patient Instructions for Use for detailed instructions. Use exactly as directed. Tell your healthcare provider immediately if you suspect that the On-body Injector may not have performed as intended or if you suspect the use of the On-body Injector resulted in a missed or partial dose. It is important that you put your used needles and syringes in a special sharps container. Do not put them in a trash can. If you do not have a sharps container, call your pharmacist or healthcare provider to get one. Talk to your pediatrician regarding the use of this medicine in children. While this drug  may be prescribed for selected conditions, precautions do apply. Overdosage: If you think you have taken too much of this medicine contact a poison control center or emergency room at once. NOTE: This medicine is only for you. Do not share this medicine with others. What if I miss a dose? It is important not to miss your dose. Call your doctor or health care professional if you miss your dose. If you miss a dose due to an On-body Injector failure or leakage, a new dose should be administered as soon as possible using a single prefilled syringe for manual use. What may interact with this medicine? Interactions have not been studied. This list may not describe all possible interactions. Give your health care provider a list of all the medicines, herbs, non-prescription drugs, or dietary supplements you use. Also tell them if you smoke, drink alcohol, or use illegal drugs. Some items may interact with your medicine. What should I watch for while using this medicine? Your condition will be monitored carefully while you are receiving this medicine. You may need blood work done while you are taking this medicine. Talk to your health care provider about your risk of cancer. You may be more at risk for certain types of cancer if you take this medicine. If you are going to need a MRI, CT scan, or other procedure, tell your doctor that you are using this medicine (On-Body Injector only). What side effects may I notice from receiving this medicine? Side effects that you should report to your doctor or health care professional as soon as possible:  allergic reactions (skin rash, itching or hives, swelling of   the face, lips, or tongue)  back pain  dizziness  fever  pain, redness, or irritation at site where injected  pinpoint red spots on the skin  red or dark-brown urine  shortness of breath or breathing problems  stomach or side pain, or pain at the shoulder  swelling  tiredness  trouble  passing urine or change in the amount of urine  unusual bruising or bleeding Side effects that usually do not require medical attention (report to your doctor or health care professional if they continue or are bothersome):  bone pain  muscle pain This list may not describe all possible side effects. Call your doctor for medical advice about side effects. You may report side effects to FDA at 1-800-FDA-1088. Where should I keep my medicine? Keep out of the reach of children. If you are using this medicine at home, you will be instructed on how to store it. Throw away any unused medicine after the expiration date on the label. NOTE: This sheet is a summary. It may not cover all possible information. If you have questions about this medicine, talk to your doctor, pharmacist, or health care provider.  2021 Elsevier/Gold Standard (2019-05-26 13:20:51)  

## 2020-07-22 ENCOUNTER — Encounter: Payer: Self-pay | Admitting: Hematology and Oncology

## 2020-07-22 ENCOUNTER — Other Ambulatory Visit: Payer: Self-pay | Admitting: Medical

## 2020-07-22 MED ORDER — DOXYCYCLINE HYCLATE 100 MG PO TABS: 100.0000 mg | ORAL_TABLET | Freq: Two times a day (BID) | ORAL | 0 refills | Status: DC

## 2020-07-29 NOTE — Progress Notes (Signed)
Patient Care Team: [name], MD as PCP - General (Internal Medicine) Rockwell Germany, RN as Oncology Nurse Navigator Mauro Kaufmann, RN as Oncology Nurse Navigator Coralie Keens, MD as Consulting Physician (General Surgery) Nicholas Lose, MD as Consulting Physician (Hematology and Oncology) [name], MD as Consulting Physician (Radiation Oncology)  DIAGNOSIS:    ICD-10-CM   1. Malignant neoplasm of upper-outer quadrant of right breast in female, estrogen receptor negative ([place])  C50.411    Z17.1     SUMMARY OF ONCOLOGIC HISTORY: Oncology History  Malignant neoplasm of upper-outer quadrant of right breast in female, estrogen receptor negative ([place])  03/28/2020 Initial Diagnosis   Patient palpated a right breast mass x2 wks. Mammogram and US showed a 2.0cm mass at the 10 o'clock position in the right breast and two enlarged right axillary lymph nodes, 2.0cm and 1.7cm. 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   04/16/2020 -  Chemotherapy    Patient is on Treatment Plan: BREAST  DOCETAXEL + CARBOPLATIN + TRASTUZUMAB + PERTUZUMAB  (TCHP) Q21D       04/18/2020 Genetic Testing   Negative genetic testing: no pathogenic variants detected in [facility] Multi-Cancer Panel.  Variants of uncertain significance detected in CDH1 at c.1289T>G (p.Val430Gly) and EGFR at c.2873G>A (p.Arg958His).  The report date is April 18, 2020.   The Multi-Cancer Panel offered by [facility] includes sequencing and/or deletion duplication testing of the following 85 genes: AIP, ALK, APC, ATM, AXIN2,BAP1,  BARD1, BLM, BMPR1A, BRCA1, BRCA2, BRIP1, CASR, CDC73, CDH1, CDK4, CDKN1B, CDKN1C, CDKN2A (p14ARF), CDKN2A (p16INK4a), CEBPA, CHEK2, CTNNA1, DICER1, DIS3L2, EGFR, EPCAM (Deletion/duplication testing only), FH, FLCN, GATA2, GPC3, GREM1 (Promoter region deletion/duplication testing only), HOXB13 (c.251G>A, p.Gly84Glu), HRAS, KIT, MAX, MEN1,  MET, MITF (c.952G>A, p.Glu318Lys variant only), MLH1, MSH2, MSH3, MSH6, MUTYH, NBN, NF1, NF2, NTHL1, PALB2, PDGFRA, PHOX2B, PMS2, POLD1, POLE, POT1, PRKAR1A, PTCH1, PTEN, RAD50, RAD51C, RAD51D, RB1, RECQL4, RET, RNF43, RUNX1, SDHAF2, SDHA (sequence changes only), SDHB, SDHC, SDHD, SMAD4, SMARCA4, SMARCB1, SMARCE1, STK11, SUFU, TERC, TERT, TMEM127, TP53, TSC1, TSC2, VHL, WRN and WT1.      CHIEF COMPLIANT: Cycle6TCH Perjeta  INTERVAL HISTORY: Shannon Kane is a 50 y.o. with above-mentioned history of right breast cancer currently on neoadjuvant chemotherapy with [place].She presents to the clinic todayfor treatment.   She is complaining of peripheral neuropathy getting worse.  One of the nails of her leg appears to have darkened significantly.  She does have diarrhea almost every day.  This is in spite of stopping Perjeta.  ALLERGIES:  is allergic to amoxicillin, ciprofloxacin, and penicillins.  MEDICATIONS:  Current Outpatient Medications  Medication Sig Dispense Refill  . Cholecalciferol (VITAMIN D-3 PO) Take 6,000 Units by mouth daily.     . clotrimazole-betamethasone (LOTRISONE) cream clotrimazole-betamethasone 1 %-0.05 % topical cream  APPLY TO AFFECTED AREA TWICE A DAY MORNING AND EVENING FOR 2 WEEKS (Patient not taking: Reported on 07/10/2020)    . dexamethasone (DECADRON) 4 MG tablet Take 1 tablet (4 mg total) by mouth daily. 1 tab day before chemo and 1 tab day after chemo 12 tablet 0  . diphenoxylate-atropine (LOMOTIL) 2.5-0.025 MG tablet Take 1 tablet by mouth 4 (four) times daily as needed for diarrhea or loose stools. 30 tablet 2  . doxycycline (VIBRA-TABS) 100 MG tablet Take 1 tablet (100 mg total) by mouth 2 (two) times daily. 20 tablet 0  . lidocaine-prilocaine (EMLA) cream Apply to affected area once  30 g 3  . lisinopril (ZESTRIL) 20 MG tablet TAKE 1 TABLET DAILY 90 tablet 3  . LORazepam (ATIVAN) 0.5 MG tablet Take 1 tablet (0.5 mg total) by mouth at bedtime as needed for  sleep. (Patient not taking: Reported on 07/10/2020) 30 tablet 0  . MAGNESIUM CITRATE PO Take 500 mcg by mouth. Only when having trouble sleeping    . ondansetron (ZOFRAN) 8 MG tablet Take 1 tablet (8 mg total) by mouth 2 (two) times daily as needed (Nausea or vomiting). Start on the third day after chemotherapy. (Patient not taking: Reported on 07/10/2020) 30 tablet 1  . pantoprazole (PROTONIX) 20 MG tablet Take 1 tablet (20 mg total) by mouth daily. (Patient not taking: Reported on 07/10/2020) 30 tablet 2  . potassium chloride (KLOR-CON) 10 MEQ tablet Take 1 tablet (10 mEq total) by mouth daily. 30 tablet 1  . prochlorperazine (COMPAZINE) 10 MG tablet Take 1 tablet (10 mg total) by mouth every 6 (six) hours as needed (Nausea or vomiting). (Patient not taking: Reported on 07/10/2020) 30 tablet 1  . SYNTHROID 75 MCG tablet TAKE 1 TABLET ON MONDAY THROUGH SUNDAY 90 tablet 3  . triamcinolone (KENALOG) 0.1 % triamcinolone acetonide 0.1 % topical cream  APPLY 2 TIMES EVERY DAY A THIN LAYER TO THE AFFECTED AREA(S) (Patient not taking: Reported on 07/10/2020)     No current facility-administered medications for this visit.    PHYSICAL EXAMINATION: ECOG PERFORMANCE STATUS: 1 - Symptomatic but completely ambulatory      Vitals:   07/30/20 1004 07/30/20 1005  BP: (!) 150/89 (!) 136/93  Pulse: 99   Resp: 18   Temp: 97.9 F (36.6 C)   SpO2: 100%    Filed Weights   07/30/20 1004  Weight: 190 lb 14.4 oz (86.6 kg)    LABORATORY DATA:  I have reviewed the data as listed CMP Latest Ref Rng & Units 07/09/2020 06/18/2020 05/28/2020  Glucose 70 - 99 mg/dL 94 95 108(H)  BUN 6 - 20 mg/dL _0 Creatinine 0.44 - 1.00 mg/dL 0.71 0.76 0.78  Sodium 135 - 145 mmol/L 142 141 142  Potassium 3.5 - 5.1 mmol/L 3.2(L) 3.0(L) 3.7  Chloride 98 - 111 mmol/L 108 106 108  CO2 22 - 32 mmol/L _1 Calcium 8.9 - 10.3 mg/dL 8.5(L) 9.0 9.4  Total Protein 6.5 - 8.1 g/dL 6.3(L) 6.9 7.0  Total Bilirubin 0.3 - 1.2  mg/dL 0.4 0.6 0.6  Alkaline Phos 38 - 126 U/L 70 77 83  AST 15 - 41 U/L _2 ALT 0 - 44 U/L _3 Lab Results  Component Value Date   WBC 8.7 07/30/2020   HGB 11.4 (L) 07/30/2020   HCT 33.6 (L) 07/30/2020   MCV 99.7 07/30/2020   PLT 201 07/30/2020   NEUTROABS 5.8 07/30/2020    ASSESSMENT & PLAN:  Malignant neoplasm of upper-outer quadrant of right breast in female, estrogen receptor negative ([place]) 03/28/2020:Patient palpated a right breast mass x2 wks. Mammogram and US showed a 2.0cm mass at the 10 o'clock position in the right breast and two enlarged right axillary lymph nodes, 2.0cm and 1.7cm. 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 Satellite lesion biopsy: DCIS ER 0%, PR 0%  Treatment plan: 1. Neoadjuvant chemotherapy with TCH Perjeta 6 cycles followed by HerceptinPerjeta versus Kadcylamaintenance for 1 year 2. Followed by breast conserving surgerywith  targeted node dissection 3. Followed by adjuvant radiation therapy if patient had lumpectomy ----------------------------------------------------------------------------------------------------------------------------------------- Current treatment: [place] (Perjeta being discontinued with cycle 5, Taxotere discontinued for cycle 6) Echocardiogram 04/09/2020: EF 60 to 65%  Chemo toxicities: 1.Severe fatigue 2.diarrhea:Gets 4-5 loose stools per day.  In spite of stopping Perjeta, the diarrhea still persisted. 3.chemo induced anemia: Monitoring closely today's hemoglobin 10.7 4.  Chemo-induced peripheral neuropathy: Because of worsening neuropathy we decided to discontinue Taxotere for this last chemo. 5.  Hypokalemia: Currently on oral potassium at home but she tells me that her potassium pills come out straight in her bowel movement.  Today's potassium is 3.1 and therefore we will give her IV potassium today.  Patient has  appointments for breast MRI and follow-up with surgery with Dr. [name].   We will see her after surgery so that we can decide maintenance therapy of Herceptin Perjeta versus Kadcyla.    No orders of the defined types were placed in this encounter.  The patient has a good understanding of the overall plan. she agrees with it. she will call with any problems that may develop before the next visit here.  Total time spent: 30 mins including face to face time and time spent for planning, charting and coordination of care  Rulon Eisenmenger, MD, MPH 07/30/2020  I, Molly Dorshimer, am acting as scribe for Dr. Nicholas Lose.  I have reviewed the above documentation for accuracy and completeness, and I agree with the above.

## 2020-07-30 ENCOUNTER — Encounter: Payer: Self-pay | Admitting: *Deleted

## 2020-07-30 ENCOUNTER — Inpatient Hospital Stay (HOSPITAL_BASED_OUTPATIENT_CLINIC_OR_DEPARTMENT_OTHER): Admitting: Hematology and Oncology

## 2020-07-30 ENCOUNTER — Inpatient Hospital Stay

## 2020-07-30 ENCOUNTER — Inpatient Hospital Stay: Attending: Hematology and Oncology

## 2020-07-30 ENCOUNTER — Other Ambulatory Visit: Payer: Self-pay

## 2020-07-30 DIAGNOSIS — Z5111 Encounter for antineoplastic chemotherapy: Secondary | ICD-10-CM | POA: Diagnosis present

## 2020-07-30 DIAGNOSIS — R5383 Other fatigue: Secondary | ICD-10-CM | POA: Diagnosis not present

## 2020-07-30 DIAGNOSIS — Z79899 Other long term (current) drug therapy: Secondary | ICD-10-CM | POA: Insufficient documentation

## 2020-07-30 DIAGNOSIS — Z171 Estrogen receptor negative status [ER-]: Secondary | ICD-10-CM | POA: Insufficient documentation

## 2020-07-30 DIAGNOSIS — Z95828 Presence of other vascular implants and grafts: Secondary | ICD-10-CM

## 2020-07-30 DIAGNOSIS — Z7952 Long term (current) use of systemic steroids: Secondary | ICD-10-CM | POA: Diagnosis not present

## 2020-07-30 DIAGNOSIS — G629 Polyneuropathy, unspecified: Secondary | ICD-10-CM | POA: Diagnosis not present

## 2020-07-30 DIAGNOSIS — R197 Diarrhea, unspecified: Secondary | ICD-10-CM | POA: Diagnosis not present

## 2020-07-30 DIAGNOSIS — C50411 Malignant neoplasm of upper-outer quadrant of right female breast: Secondary | ICD-10-CM

## 2020-07-30 DIAGNOSIS — E876 Hypokalemia: Secondary | ICD-10-CM

## 2020-07-30 DIAGNOSIS — Z5112 Encounter for antineoplastic immunotherapy: Secondary | ICD-10-CM | POA: Insufficient documentation

## 2020-07-30 LAB — CMP (CANCER CENTER ONLY)
ALT: 15 U/L (ref 0–44)
AST: 19 U/L (ref 15–41)
Albumin: 3.9 g/dL (ref 3.5–5.0)
Alkaline Phosphatase: 84 U/L (ref 38–126)
Anion gap: 9 (ref 5–15)
BUN: 10 mg/dL (ref 6–20)
CO2: 25 mmol/L (ref 22–32)
Calcium: 8.7 mg/dL — ABNORMAL LOW (ref 8.9–10.3)
Chloride: 109 mmol/L (ref 98–111)
Creatinine: 0.73 mg/dL (ref 0.44–1.00)
GFR, Estimated: >60 mL/min (ref >60)
Glucose, Bld: 97 mg/dL (ref 70–99)
Potassium: 3.1 mmol/L — ABNORMAL LOW (ref 3.5–5.1)
Sodium: 143 mmol/L (ref 135–145)
Total Bilirubin: 0.3 mg/dL (ref 0.3–1.2)
Total Protein: 7 g/dL (ref 6.5–8.1)

## 2020-07-30 LAB — CBC WITH DIFFERENTIAL (CANCER CENTER ONLY)
Abs Immature Granulocytes: 0.03 10*3/uL (ref 0.00–0.07)
Basophils Absolute: 0.1 10*3/uL (ref 0.0–0.1)
Basophils Relative: 1 %
Eosinophils Absolute: 0 10*3/uL (ref 0.0–0.5)
Eosinophils Relative: 0 %
HCT: 33.6 % — ABNORMAL LOW (ref 36.0–46.0)
Hemoglobin: 11.4 g/dL — ABNORMAL LOW (ref 12.0–15.0)
Immature Granulocytes: 0 %
Lymphocytes Relative: 20 %
Lymphs Abs: 1.7 10*3/uL (ref 0.7–4.0)
MCH: 33.8 pg (ref 26.0–34.0)
MCHC: 33.9 g/dL (ref 30.0–36.0)
MCV: 99.7 fL (ref 80.0–100.0)
Monocytes Absolute: 1.1 10*3/uL — ABNORMAL HIGH (ref 0.1–1.0)
Monocytes Relative: 13 %
Neutro Abs: 5.8 10*3/uL (ref 1.7–7.7)
Neutrophils Relative %: 66 %
Platelet Count: 201 10*3/uL (ref 150–400)
RBC: 3.37 MIL/uL — ABNORMAL LOW (ref 3.87–5.11)
RDW: 15.9 % — ABNORMAL HIGH (ref 11.5–15.5)
WBC Count: 8.7 10*3/uL (ref 4.0–10.5)
nRBC: 0 % (ref 0.0–0.2)

## 2020-07-30 MED ORDER — POTASSIUM CHLORIDE 10 MEQ/100ML IV SOLN
10.0000 meq | Freq: Once | INTRAVENOUS | Status: AC
  Administered 2020-07-30: 10 meq via INTRAVENOUS

## 2020-07-30 MED ORDER — PALONOSETRON HCL INJECTION 0.25 MG/5ML
0.2500 mg | Freq: Once | INTRAVENOUS | Status: AC
  Administered 2020-07-30: 0.25 mg via INTRAVENOUS

## 2020-07-30 MED ORDER — SODIUM CHLORIDE 0.9 % IV SOLN
150.0000 mg | Freq: Once | INTRAVENOUS | Status: AC
  Administered 2020-07-30: 150 mg via INTRAVENOUS
  Filled 2020-07-30: qty 150

## 2020-07-30 MED ORDER — DIPHENHYDRAMINE HCL 25 MG PO CAPS
ORAL_CAPSULE | ORAL | Status: AC
  Filled 2020-07-30: qty 1

## 2020-07-30 MED ORDER — CARBOPLATIN CHEMO INJECTION 600 MG/60ML
700.0000 mg | Freq: Once | INTRAVENOUS | Status: AC
Start: 2020-07-30 — End: 2020-07-30
  Administered 2020-07-30: 700 mg via INTRAVENOUS
  Filled 2020-07-30: qty 70

## 2020-07-30 MED ORDER — TRASTUZUMAB-DKST CHEMO 150 MG IV SOLR
6.0000 mg/kg | Freq: Once | INTRAVENOUS | Status: AC
  Administered 2020-07-30: 483 mg via INTRAVENOUS
  Filled 2020-07-30: qty 23

## 2020-07-30 MED ORDER — ACETAMINOPHEN 325 MG PO TABS
ORAL_TABLET | ORAL | Status: AC
  Filled 2020-07-30: qty 2

## 2020-07-30 MED ORDER — SODIUM CHLORIDE 0.9% FLUSH
10.0000 mL | INTRAVENOUS | Status: DC | PRN
  Administered 2020-07-30: 10 mL
  Filled 2020-07-30: qty 10

## 2020-07-30 MED ORDER — SODIUM CHLORIDE 0.9 % IV SOLN
Freq: Once | INTRAVENOUS | Status: AC
  Filled 2020-07-30: qty 250

## 2020-07-30 MED ORDER — SODIUM CHLORIDE 0.9% FLUSH
10.0000 mL | Freq: Once | INTRAVENOUS | Status: AC
  Administered 2020-07-30: 10 mL
  Filled 2020-07-30: qty 10

## 2020-07-30 MED ORDER — DIPHENHYDRAMINE HCL 25 MG PO CAPS
25.0000 mg | ORAL_CAPSULE | Freq: Once | ORAL | Status: AC
Start: 2020-07-30 — End: 2020-07-30
  Administered 2020-07-30: 25 mg via ORAL

## 2020-07-30 MED ORDER — POTASSIUM CHLORIDE 10 MEQ/100ML IV SOLN
INTRAVENOUS | Status: AC
  Filled 2020-07-30: qty 100

## 2020-07-30 MED ORDER — PALONOSETRON HCL INJECTION 0.25 MG/5ML
INTRAVENOUS | Status: AC
  Filled 2020-07-30: qty 5

## 2020-07-30 MED ORDER — ACETAMINOPHEN 325 MG PO TABS
650.0000 mg | ORAL_TABLET | Freq: Once | ORAL | Status: AC
  Administered 2020-07-30: 650 mg via ORAL

## 2020-07-30 MED ORDER — SODIUM CHLORIDE 0.9 % IV SOLN
10.0000 mg | Freq: Once | INTRAVENOUS | Status: AC
  Administered 2020-07-30: 10 mg via INTRAVENOUS
  Filled 2020-07-30: qty 10

## 2020-07-30 MED ORDER — HEPARIN SOD (PORK) LOCK FLUSH 100 UNIT/ML IV SOLN
500.0000 [IU] | Freq: Once | INTRAVENOUS | Status: AC | PRN
  Administered 2020-07-30: 500 [IU]
  Filled 2020-07-30: qty 5

## 2020-07-30 NOTE — Patient Instructions (Signed)
Implanted Port Insertion, Care After This sheet gives you information about how to care for yourself after your procedure. Your health care provider may also give you more specific instructions. If you have problems or questions, contact your health care provider. What can I expect after the procedure? After the procedure, it is common to have:  Discomfort at the port insertion site.  Bruising on the skin over the port. This should improve over 3-4 days. Follow these instructions at home: Port care  After your port is placed, you will get a manufacturer's information card. The card has information about your port. Keep this card with you at all times.  Take care of the port as told by your health care provider. Ask your health care provider if you or a family member can get training for taking care of the port at home. A home health care nurse may also take care of the port.  Make sure to remember what type of port you have. Incision care  Follow instructions from your health care provider about how to take care of your port insertion site. Make sure you: ? Wash your hands with soap and water before and after you change your bandage (dressing). If soap and water are not available, use hand sanitizer. ? Change your dressing as told by your health care provider. ? Leave stitches (sutures), skin glue, or adhesive strips in place. These skin closures may need to stay in place for 2 weeks or longer. If adhesive strip edges start to loosen and curl up, you may trim the loose edges. Do not remove adhesive strips completely unless your health care provider tells you to do that.  Check your port insertion site every day for signs of infection. Check for: ? Redness, swelling, or pain. ? Fluid or blood. ? Warmth. ? Pus or a bad smell.      Activity  Return to your normal activities as told by your health care provider. Ask your health care provider what activities are safe for you.  Do not  lift anything that is heavier than 10 lb (4.5 kg), or the limit that you are told, until your health care provider says that it is safe. General instructions  Take over-the-counter and prescription medicines only as told by your health care provider.  Do not take baths, swim, or use a hot tub until your health care provider approves. Ask your health care provider if you may take showers. You may only be allowed to take sponge baths.  Do not drive for 24 hours if you were given a sedative during your procedure.  Wear a medical alert bracelet in case of an emergency. This will tell any health care providers that you have a port.  Keep all follow-up visits as told by your health care provider. This is important. Contact a health care provider if:  You cannot flush your port with saline as directed, or you cannot draw blood from the port.  You have a fever or chills.  You have redness, swelling, or pain around your port insertion site.  You have fluid or blood coming from your port insertion site.  Your port insertion site feels warm to the touch.  You have pus or a bad smell coming from the port insertion site. Get help right away if:  You have chest pain or shortness of breath.  You have bleeding from your port that you cannot control. Summary  Take care of the port as told by your   health care provider. Keep the manufacturer's information card with you at all times.  Change your dressing as told by your health care provider.  Contact a health care provider if you have a fever or chills or if you have redness, swelling, or pain around your port insertion site.  Keep all follow-up visits as told by your health care provider. This information is not intended to replace advice given to you by your health care provider. Make sure you discuss any questions you have with your health care provider. Document Revised: 11/30/2017 Document Reviewed: 11/30/2017 Elsevier Patient Education   2021 [employer]  

## 2020-07-30 NOTE — Patient Instructions (Signed)
[place] Discharge Instructions for Patients Receiving Chemotherapy  Today you received the following chemotherapy agents: Traztuzumab, Carboplatin  To help prevent nausea and vomiting after your treatment, we encourage you to take your nausea medication as directed.  If you develop nausea and vomiting that is not controlled by your nausea medication, call the clinic.   BELOW ARE SYMPTOMS THAT SHOULD BE REPORTED IMMEDIATELY:  *FEVER GREATER THAN 100.5 F  *CHILLS WITH OR WITHOUT FEVER  NAUSEA AND VOMITING THAT IS NOT CONTROLLED WITH YOUR NAUSEA MEDICATION  *UNUSUAL SHORTNESS OF BREATH  *UNUSUAL BRUISING OR BLEEDING  TENDERNESS IN MOUTH AND THROAT WITH OR WITHOUT PRESENCE OF ULCERS  *URINARY PROBLEMS  *BOWEL PROBLEMS  UNUSUAL RASH Items with * indicate a potential emergency and should be followed up as soon as possible.  Feel free to call the clinic should you have any questions or concerns. The clinic phone number is [phone].  Please show the [place] at check-in to the Emergency Department and triage nurse.   Hypokalemia Hypokalemia means that the amount of potassium in the blood is lower than normal. Potassium is a chemical (electrolyte) that helps regulate the amount of fluid in the body. It also stimulates muscle tightening (contraction) and helps nerves work properly. Normally, most of the body's potassium is inside cells, and only a very small amount is in the blood. Because the amount in the blood is so small, minor changes to potassium levels in the blood can be life-threatening. What are the causes? This condition may be caused by:  Antibiotic medicine.  Diarrhea or vomiting. Taking too much of a medicine that helps you have a bowel movement (laxative) can cause diarrhea and lead to hypokalemia.  Chronic kidney disease (CKD).  Medicines that help the body get rid of excess fluid (diuretics).  Eating disorders, such as  bulimia.  Low magnesium levels in the body.  Sweating a lot. What are the signs or symptoms? Symptoms of this condition include:  Weakness.  Constipation.  Fatigue.  Muscle cramps.  Mental confusion.  Skipped heartbeats or irregular heartbeat (palpitations).  Tingling or numbness. How is this diagnosed? This condition is diagnosed with a blood test. How is this treated? This condition may be treated by:  Taking potassium supplements by mouth.  Adjusting the medicines that you take.  Eating more foods that contain a lot of potassium. If your potassium level is very low, you may need to get potassium through an IV and be monitored in the hospital. Follow these instructions at home:  Take over-the-counter and prescription medicines only as told by your health care provider. This includes vitamins and supplements.  Eat a healthy diet. A healthy diet includes fresh fruits and vegetables, whole grains, healthy fats, and lean proteins.  If instructed, eat more foods that contain a lot of potassium. This includes: ? Nuts, such as peanuts and pistachios. ? Seeds, such as sunflower seeds and pumpkin seeds. ? Peas, lentils, and lima beans. ? Whole grain and bran cereals and breads. ? Fresh fruits and vegetables, such as apricots, avocado, bananas, cantaloupe, kiwi, oranges, tomatoes, asparagus, and potatoes. ? Orange juice. ? Tomato juice. ? Red meats. ? Yogurt.  Keep all follow-up visits as told by your health care provider. This is important.   Contact a health care provider if you:  Have weakness that gets worse.  Feel your heart pounding or racing.  Vomit.  Have diarrhea.  Have diabetes (diabetes mellitus) and you have trouble keeping  your blood sugar (glucose) in your target range. Get help right away if you:  Have chest pain.  Have shortness of breath.  Have vomiting or diarrhea that lasts for more than 2 days.  Faint. Summary  Hypokalemia means that  the amount of potassium in the blood is lower than normal.  This condition is diagnosed with a blood test.  Hypokalemia may be treated by taking potassium supplements, adjusting the medicines that you take, or eating more foods that are high in potassium.  If your potassium level is very low, you may need to get potassium through an IV and be monitored in the hospital. This information is not intended to replace advice given to you by your health care provider. Make sure you discuss any questions you have with your health care provider. Document Revised: 12/15/2017 Document Reviewed: 12/15/2017 Elsevier Patient Education  [place].

## 2020-07-30 NOTE — Assessment & Plan Note (Signed)
03/28/2020:Patient palpated a right breast mass x2 wks. Mammogram and US showed a 2.0cm mass at the 10 o'clock position in the right breast and two enlarged right axillary lymph nodes, 2.0cm and 1.7cm. 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 Satellite lesion biopsy: DCIS ER 0%, PR 0%  Treatment plan: 1. Neoadjuvant chemotherapy with TCH Perjeta 6 cycles followed by HerceptinPerjeta versus Kadcylamaintenance for 1 year 2. Followed by breast conserving surgerywith targeted node dissection 3. Followed by adjuvant radiation therapy if patient had lumpectomy ----------------------------------------------------------------------------------------------------------------------------------------- Current treatment: [place] (Perjeta being discontinued with cycle 5) Echocardiogram 04/09/2020: EF 60 to 65%  Chemo toxicities: 1.Severe fatigue 2.diarrhea:Gets 4-5 loose stools per day. This is in spite of Imodium and Lomotil.  Stopped Perjeta today. 3.chemo induced anemia: Monitoring closely today's hemoglobin 10.7 4.  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 [name]. I will set her up for Herceptin Perjeta in 3 weeks. We will see her after surgery so that we can decide maintenance therapy of Herceptin Perjeta versus Kadcyla.

## 2020-07-31 ENCOUNTER — Ambulatory Visit
Admission: RE | Admit: 2020-07-31 | Discharge: 2020-07-31 | Disposition: A | Discharge status: Home / Self Care | Source: Ambulatory Visit | Attending: Hematology and Oncology | Admitting: Hematology and Oncology

## 2020-07-31 DIAGNOSIS — Z171 Estrogen receptor negative status [ER-]: Secondary | ICD-10-CM

## 2020-07-31 DIAGNOSIS — C50411 Malignant neoplasm of upper-outer quadrant of right female breast: Secondary | ICD-10-CM

## 2020-07-31 MED ORDER — GADOBUTROL 1 MMOL/ML IV SOLN
9.0000 mL | Freq: Once | INTRAVENOUS | Status: AC | PRN
  Administered 2020-07-31: 9 mL via INTRAVENOUS

## 2020-08-01 ENCOUNTER — Ambulatory Visit: Payer: Self-pay

## 2020-08-01 ENCOUNTER — Telehealth: Payer: Self-pay | Admitting: Hematology and Oncology

## 2020-08-01 ENCOUNTER — Encounter: Payer: Self-pay | Admitting: *Deleted

## 2020-08-01 NOTE — Telephone Encounter (Signed)
Reviewed final MRI results. Radiologic Complete response Will see her post surgery.

## 2020-08-02 ENCOUNTER — Other Ambulatory Visit: Payer: Self-pay | Admitting: Surgery

## 2020-08-02 DIAGNOSIS — Z853 Personal history of malignant neoplasm of breast: Secondary | ICD-10-CM

## 2020-08-07 ENCOUNTER — Telehealth: Payer: Self-pay | Admitting: Hematology and Oncology

## 2020-08-07 ENCOUNTER — Encounter: Payer: Self-pay | Admitting: *Deleted

## 2020-08-07 DIAGNOSIS — Z171 Estrogen receptor negative status [ER-]: Secondary | ICD-10-CM

## 2020-08-07 DIAGNOSIS — C50411 Malignant neoplasm of upper-outer quadrant of right female breast: Secondary | ICD-10-CM

## 2020-08-07 NOTE — Telephone Encounter (Signed)
Scheduled per 3/23 sch msg. Called and spoke with pt confirmed 4/11 appt

## 2020-08-08 ENCOUNTER — Other Ambulatory Visit: Payer: Self-pay | Admitting: Surgery

## 2020-08-08 DIAGNOSIS — Z853 Personal history of malignant neoplasm of breast: Secondary | ICD-10-CM

## 2020-08-12 ENCOUNTER — Other Ambulatory Visit: Payer: Self-pay | Admitting: Surgery

## 2020-08-12 ENCOUNTER — Encounter: Payer: Self-pay | Admitting: *Deleted

## 2020-08-12 DIAGNOSIS — Z853 Personal history of malignant neoplasm of breast: Secondary | ICD-10-CM

## 2020-08-14 ENCOUNTER — Other Ambulatory Visit: Payer: Self-pay | Admitting: Hematology and Oncology

## 2020-08-15 NOTE — Progress Notes (Signed)
Surgical Instructions    Your procedure is scheduled on 08/20/20.  Report to [facility] Main Entrance "A" at 09:30 A.M., then check in with the Admitting office.  Call this number if you have problems the morning of surgery:  [phone]   If you have any questions prior to your surgery date call [phone]: Open Monday-Friday 8am-4pm    Remember:  Do not eat after midnight the night before your surgery  You may drink clear liquids until 08:30am the morning of your surgery.   Clear liquids allowed are: Water, Non-Citrus Juices (without pulp), Carbonated Beverages, Clear Tea, Black Coffee Only, and Gatorade  Patient Instructions  . The night before surgery:  o No food after midnight. ONLY clear liquids after midnight  . The day of surgery (if you do NOT have diabetes):  o Drink ONE (1) Pre-Surgery Clear Ensure by 08:30am the morning of surgery. Drink in one sitting. Do not sip.  o This drink was given to you during your hospital  pre-op appointment visit. o Nothing else to drink after completing the  Pre-Surgery Clear Ensure.    Take these medicines the morning of surgery with A SIP OF WATER  pantoprazole (PROTONIX) if needed SYNTHROID  As of today, STOP taking any Aspirin (unless otherwise instructed by your surgeon) Aleve, Naproxen, Ibuprofen, Motrin, Advil, Goody's, BC's, all herbal medications, fish oil, and all vitamins.                     Do not wear jewelry, make up, or nail polish            Do not wear lotions, powders, perfumes/colognes, or deodorant.            Do not shave 48 hours prior to surgery.              Do not bring valuables to the hospital.            [facility] is not responsible for any belongings or valuables.  Do NOT Smoke (Tobacco/Vaping) or drink Alcohol 24 hours prior to your procedure If you use a CPAP at night, you may bring all equipment for your overnight stay.   Contacts, glasses, dentures or bridgework may not be worn into surgery,  please bring cases for these belongings   For patients admitted to the hospital, discharge time will be determined by your treatment team.   Patients discharged the day of surgery will not be allowed to drive home, and someone needs to stay with them for 24 hours.    Special instructions:   [place]- Preparing For Surgery  Before surgery, you can play an important role. Because skin is not sterile, your skin needs to be as free of germs as possible. You can reduce the number of germs on your skin by washing with CHG (chlorahexidine gluconate) Soap before surgery.  CHG is an antiseptic cleaner which kills germs and bonds with the skin to continue killing germs even after washing.    Oral Hygiene is also important to reduce your risk of infection.  Remember - BRUSH YOUR TEETH THE MORNING OF SURGERY WITH YOUR REGULAR TOOTHPASTE  Please do not use if you have an allergy to CHG or antibacterial soaps. If your skin becomes reddened/irritated stop using the CHG.  Do not shave (including legs and underarms) for at least 48 hours prior to first CHG shower. It is OK to shave your face.  Please follow these instructions carefully.  1. Shower the NIGHT BEFORE SURGERY and the MORNING OF SURGERY  2. If you chose to wash your hair, wash your hair first as usual with your normal shampoo.  3. After you shampoo, rinse your hair and body thoroughly to remove the shampoo.  4. Wash Face and genitals (private parts) with your normal soap.   5.  Shower the NIGHT BEFORE SURGERY and the MORNING OF SURGERY with CHG Soap.   6. Use CHG Soap as you would any other liquid soap. You can apply CHG directly to the skin and wash gently with a scrungie or a clean washcloth.   7. Apply the CHG Soap to your body ONLY FROM THE NECK DOWN.  Do not use on open wounds or open sores. Avoid contact with your eyes, ears, mouth and genitals (private parts). Wash Face and genitals (private parts)  with your normal soap.    8. Wash thoroughly, paying special attention to the area where your surgery will be performed.  9. Thoroughly rinse your body with warm water from the neck down.  10. DO NOT shower/wash with your normal soap after using and rinsing off the CHG Soap.  11. Pat yourself dry with a CLEAN TOWEL.  12. Wear CLEAN PAJAMAS to bed the night before surgery  13. Place CLEAN SHEETS on your bed the night before your surgery  14. DO NOT SLEEP WITH PETS.   Day of Surgery: Take a shower with CHG soap.  Wear Clean/Comfortable clothing the morning of surgery Do not apply any deodorants/lotions.   Remember to brush your teeth WITH YOUR REGULAR TOOTHPASTE.   Please read over the following fact sheets that you were given.

## 2020-08-16 ENCOUNTER — Encounter (HOSPITAL_COMMUNITY)
Admission: RE | Admit: 2020-08-16 | Discharge: 2020-08-16 | Disposition: A | Discharge status: Home / Self Care | Source: Ambulatory Visit | Attending: Surgery | Admitting: Surgery

## 2020-08-16 ENCOUNTER — Other Ambulatory Visit: Payer: Self-pay

## 2020-08-16 ENCOUNTER — Other Ambulatory Visit (HOSPITAL_COMMUNITY)

## 2020-08-16 ENCOUNTER — Encounter (HOSPITAL_COMMUNITY): Payer: Self-pay

## 2020-08-16 DIAGNOSIS — G620 Drug-induced polyneuropathy: Secondary | ICD-10-CM | POA: Diagnosis not present

## 2020-08-16 DIAGNOSIS — I10 Essential (primary) hypertension: Secondary | ICD-10-CM | POA: Insufficient documentation

## 2020-08-16 DIAGNOSIS — D509 Iron deficiency anemia, unspecified: Secondary | ICD-10-CM | POA: Insufficient documentation

## 2020-08-16 DIAGNOSIS — C50911 Malignant neoplasm of unspecified site of right female breast: Secondary | ICD-10-CM | POA: Insufficient documentation

## 2020-08-16 DIAGNOSIS — Z79899 Other long term (current) drug therapy: Secondary | ICD-10-CM | POA: Diagnosis not present

## 2020-08-16 DIAGNOSIS — Z20822 Contact with and (suspected) exposure to covid-19: Secondary | ICD-10-CM | POA: Diagnosis not present

## 2020-08-16 DIAGNOSIS — E039 Hypothyroidism, unspecified: Secondary | ICD-10-CM | POA: Diagnosis not present

## 2020-08-16 DIAGNOSIS — Z95828 Presence of other vascular implants and grafts: Secondary | ICD-10-CM | POA: Diagnosis not present

## 2020-08-16 DIAGNOSIS — Z79890 Hormone replacement therapy (postmenopausal): Secondary | ICD-10-CM | POA: Diagnosis not present

## 2020-08-16 DIAGNOSIS — Z01812 Encounter for preprocedural laboratory examination: Secondary | ICD-10-CM | POA: Diagnosis not present

## 2020-08-16 HISTORY — DX: Thyrotoxicosis, unspecified without thyrotoxic crisis or storm: E05.90

## 2020-08-16 LAB — BASIC METABOLIC PANEL
Anion gap: 4 — ABNORMAL LOW (ref 5–15)
BUN: 8 mg/dL (ref 6–20)
CO2: 30 mmol/L (ref 22–32)
Calcium: 8.7 mg/dL — ABNORMAL LOW (ref 8.9–10.3)
Chloride: 107 mmol/L (ref 98–111)
Creatinine, Ser: 0.79 mg/dL (ref 0.44–1.00)
GFR, Estimated: >60 mL/min (ref >60)
Glucose, Bld: 98 mg/dL (ref 70–99)
Potassium: 3.3 mmol/L — ABNORMAL LOW (ref 3.5–5.1)
Sodium: 141 mmol/L (ref 135–145)

## 2020-08-16 LAB — SARS CORONAVIRUS 2 (TAT 6-24 HRS): SARS Coronavirus 2: NEGATIVE

## 2020-08-16 LAB — CBC
HCT: 35.8 % — ABNORMAL LOW (ref 36.0–46.0)
Hemoglobin: 11.6 g/dL — ABNORMAL LOW (ref 12.0–15.0)
MCH: 34.8 pg — ABNORMAL HIGH (ref 26.0–34.0)
MCHC: 32.4 g/dL (ref 30.0–36.0)
MCV: 107.5 fL — ABNORMAL HIGH (ref 80.0–100.0)
Platelets: 131 10*3/uL — ABNORMAL LOW (ref 150–400)
RBC: 3.33 MIL/uL — ABNORMAL LOW (ref 3.87–5.11)
RDW: 15.3 % (ref 11.5–15.5)
WBC: 4.3 10*3/uL (ref 4.0–10.5)
nRBC: 0 % (ref 0.0–0.2)

## 2020-08-16 LAB — PREGNANCY, URINE: Preg Test, Ur: NEGATIVE

## 2020-08-16 NOTE — Progress Notes (Addendum)
PCP - [name] Cardiologist - denies Gastroenterologist-  Dr Juanita Craver Oncologist: Dr. Lindi Adie OBGYN: Gerald Dexter  PPM/ICD - denies  Chest x-ray - n/a EKG - 07/10/20 Stress Test - denies ECHO - 04/09/20 Cardiac Cath - denies  Sleep Study - denies  Patient instructed to hold all Aspirin, NSAID's, herbal medications, fish oil and vitamins 7 days prior to surgery.   ERAS Protcol -yes PRE-SURGERY Ensure or G2- ensure give  COVID TEST- 08/16/20   Anesthesia review: yes, seed placement and anethesia during port placement was the best for her. She wasn't nauseous afterwards or emotional. Ebony Hail, PA-C notified of elevated blood pressure.   Patient denies shortness of breath, fever, cough and chest pain at PAT appointment   All instructions explained to the patient, with a verbal understanding of the material. Patient agrees to go over the instructions while at home for a better understanding. Patient also instructed to self quarantine after being tested for COVID-19. The opportunity to ask questions was provided.

## 2020-08-16 NOTE — Progress Notes (Addendum)
Anesthesia Chart Review:  Case: [id number] Date/Time: 08/20/20 1015   Procedures:      RIGHT BREAST LUMPECTOMY WITH RADIOACTIVE SEED LOCALIZATION (Right Breast)     RADIOACTIVE SEED GUIDED TARGETED AXILLARY SENTINEL LYMPH NODE DISSECTION (Right Breast)   Anesthesia type: General   Pre-op diagnosis: RIGHT BREAST CANCER   Location: [facility] OR ROOM 08 / [place]   Surgeons: Coralie Keens, MD    BCG 08/19/20 at 2:30 PM   DISCUSSION: Patient is a 50 year old female scheduled for the above procedure.   History includes never smoker, HTN, palpitations, hypothyroidism (on Synthroid), right breast cancer (diagnosed 03/2020; LEFT SCV Port-a-cath 04/15/20, chemotherapy started 04/16/20), iron deficiency anemia, chemo-induced peripheral neuropathy.  For anesthesia history she reported being emotional and nauseated after previous surgery, but did well with anesthesia for PAC placement on 04/15/20 at [facility].   Per 07/30/20 note by Dr. Lindi Adie: Last breast cancer treatment: [place] being discontinued with cycle 5, Taxotere discontinued for cycle 6).  Because of worsening neuropathy we decided to discontinue Taxotere for this last chemo. (Received Ogivri, carboplatin on 07/30/20.)  He plans to see her after breast surgery.   BP 163/102. She is due to take lisinopril this evening. Reported BP at home do not tend to run as high. Feels she has a degree of white coat hypertension. Advised to continue to monitor BP at home.   Urine pregnancy test negative on 08/16/20. Presurgical COVID-19 test in process. Anesthesia team to evaluate on the day of surgery.    VS: BP (!) 163/102   Pulse (!) 108   Temp 36.5 C (Oral)   Resp 18   Ht 5\' 9"  (1.753 m)   Wt 89.5 kg   LMP 09/16/2019   SpO2 100%   BMI 29.15 kg/m  No repeat HR rechecked from PAT visit. EKG 78 bpm 07/10/20. BP Readings from Last 3 Encounters:  08/16/20 (!) 163/102  07/30/20 (!) 136/93  07/11/20 (!) 142/98    PROVIDERS: [name], MD is PCP  Nicholas Lose, MD is Edson Snowball, MD is RAD-ONC Juanita Craver, MD is GI    LABS: Labs reviewed: Acceptable for surgery. LFTs normal on 07/30/20.  (all labs ordered are listed, but only abnormal results are displayed)  Labs Reviewed  BASIC METABOLIC PANEL - Abnormal; Notable for the following components:      Result Value   Potassium 3.3 (*)    Calcium 8.7 (*)    Anion gap 4 (*)    All other components within normal limits  CBC - Abnormal; Notable for the following components:   RBC 3.33 (*)    Hemoglobin 11.6 (*)    HCT 35.8 (*)    MCV 107.5 (*)    MCH 34.8 (*)    Platelets 131 (*)    All other components within normal limits  SARS CORONAVIRUS 2 (TAT 6-24 HRS)  PREGNANCY, URINE     IMAGES: 1V PCXR 04/15/20: FINDINGS: - Left subclavian Port-A-Cath has its tip projecting at the caval atrial junction. - No pneumothorax. - Heart, mediastinum and hila are within normal limits.  Clear lungs. - No pleural effusion. - Skeletal structures are grossly intact. IMPRESSION: 1. Well-positioned left anterior chest wall Port-A-Cath. 2. No pneumothorax.  No active cardiopulmonary disease.  US Thyroid 07/15/18 ([facility]): IMPRESSION: Normal sized relatively hypervascular gland. No mass or nodule.    EKG: 07/10/20: NSR at 78 bpm   CV: Echo 04/09/20: IMPRESSIONS  1. Left ventricular ejection fraction,  by estimation, is 60 to 65%. The  left ventricle has normal function. The left ventricle has no regional  wall motion abnormalities. Left ventricular diastolic parameters were  normal. The average left ventricular  global longitudinal strain is -18.3 %. The global longitudinal strain is  normal.  2. Right ventricular systolic function is normal. The right ventricular  size is normal.  3. Left atrial size was mildly dilated.  4. The mitral valve is normal in structure. No evidence of mitral valve  regurgitation. No evidence of mitral stenosis.  5.  The aortic valve is normal in structure. Aortic valve regurgitation is  not visualized. No aortic stenosis is present.  6. The inferior vena cava is normal in size with greater than 50%  respiratory variability, suggesting right atrial pressure of 3 mmHg.    Past Medical History:  Diagnosis Date  . Abnormal glucose   . Breast cancer ([place]) 03/2020   right breast IDC  . Family history of breast cancer 04/03/2020  . Family history of ovarian cancer 04/03/2020  . Hypertension   . Hyperthyroidism    in the past  . Hypothyroidism   . Iron deficiency anemia   . Palpitation   . Spontaneous ecchymoses   . Tachycardia   . Vitamin D deficiency     Past Surgical History:  Procedure Laterality Date  . BREAST BIOPSY    . BREAST EXCISIONAL BIOPSY    . CHOLECYSTECTOMY    . COLONOSCOPY  05/18/2018  . KNEE SURGERY    . PORTACATH PLACEMENT Left 04/15/2020   Procedure: INSERTION PORT-A-CATH;  Surgeon: Coralie Keens, MD;  Location: [facility];  Service: General;  Laterality: Left;  . TONSILLECTOMY      MEDICATIONS: . Cholecalciferol (VITAMIN D-3 PO)  . clotrimazole-betamethasone (LOTRISONE) cream  . dexamethasone (DECADRON) 4 MG tablet  . diphenoxylate-atropine (LOMOTIL) 2.5-0.025 MG tablet  . lidocaine-prilocaine (EMLA) cream  . lisinopril (ZESTRIL) 20 MG tablet  . LORazepam (ATIVAN) 0.5 MG tablet  . MAGNESIUM CITRATE PO  . ondansetron (ZOFRAN) 8 MG tablet  . pantoprazole (PROTONIX) 20 MG tablet  . potassium chloride (KLOR-CON) 10 MEQ tablet  . prochlorperazine (COMPAZINE) 10 MG tablet  . SYNTHROID 75 MCG tablet  . vitamin B-12 (CYANOCOBALAMIN) 1000 MCG tablet   No current facility-administered medications for this encounter.   By current medication list, she is not taking Lotrisone cream, Decadron, EMLA.   Myra Gianotti, PA-C Surgical Short Stay/Anesthesiology [facility] Phone [phone] [facility] Phone [phone] 08/16/2020 4:51 PM

## 2020-08-16 NOTE — Anesthesia Preprocedure Evaluation (Addendum)
Anesthesia Evaluation  Patient identified by MRN, date of birth, ID band Patient awake    Reviewed: Allergy & Precautions, H&P , NPO status , Patient's Chart, lab work & pertinent test results, reviewed documented beta blocker date and time   History of Anesthesia Complications (+) PONV  Airway Mallampati: II  TM Distance: >3 FB Neck ROM: full    Dental no notable dental hx. (+) Teeth Intact, Dental Advisory Given   Pulmonary    Pulmonary exam normal breath sounds clear to auscultation       Cardiovascular Exercise Tolerance: Good hypertension, Pt. on medications  Rhythm:regular Rate:Normal  EKG: 07/10/20: NSR at 78 bpm   CV: Echo 04/09/20: IMPRESSIONS  1. Left ventricular ejection fraction, by estimation, is 60 to 65%. The  left ventricle has normal function. The left ventricle has no regional  wall motion abnormalities. Left ventricular diastolic parameters were  normal. The average left ventricular  global longitudinal strain is -18.3 %. The global longitudinal strain is  normal.  2. Right ventricular systolic function is normal. The right ventricular  size is normal.  3. Left atrial size was mildly dilated.  4. The mitral valve is normal in structure. No evidence of mitral valve  regurgitation. No evidence of mitral stenosis.  5. The aortic valve is normal in structure. Aortic valve regurgitation is  not visualized. No aortic stenosis is present.  6. The inferior vena cava is normal in size with greater than 50%  respiratory variability, suggesting right atrial pressure of 3 mmHg.     Neuro/Psych    GI/Hepatic   Endo/Other  Hypothyroidism   Renal/GU      Musculoskeletal   Abdominal   Peds  Hematology  (+) Blood dyscrasia, anemia ,   Anesthesia Other Findings   Reproductive/Obstetrics                            Anesthesia Physical Anesthesia Plan  ASA:  III  Anesthesia Plan: General   Post-op Pain Management: GA combined w/ Regional for post-op pain   Induction: Intravenous  PONV Risk Score and Plan: 3 and Ondansetron, Dexamethasone, Treatment may vary due to age or medical condition, Propofol infusion and Scopolamine patch - Pre-op  Airway Management Planned: Oral ETT and LMA  Additional Equipment:   Intra-op Plan:   Post-operative Plan: Extubation in OR  Informed Consent: I have reviewed the patients History and Physical, chart, labs and discussed the procedure including the risks, benefits and alternatives for the proposed anesthesia with the patient or authorized representative who has indicated his/her understanding and acceptance.     Dental Advisory Given  Plan Discussed with: CRNA and Anesthesiologist  Anesthesia Plan Comments: (PAT note written by Myra Gianotti, PA-C. )       Anesthesia Quick Evaluation

## 2020-08-19 ENCOUNTER — Ambulatory Visit
Admission: RE | Admit: 2020-08-19 | Discharge: 2020-08-19 | Disposition: A | Discharge status: Home / Self Care | Source: Ambulatory Visit | Attending: Surgery | Admitting: Surgery

## 2020-08-19 ENCOUNTER — Other Ambulatory Visit: Payer: Self-pay

## 2020-08-19 DIAGNOSIS — Z853 Personal history of malignant neoplasm of breast: Secondary | ICD-10-CM

## 2020-08-19 NOTE — H&P (Signed)
Shannon Kane Appointment: 08/02/2020 9:50 AM Location: [facility] Patient #: [id number] DOB: [birth year] Married / Language: English / Race: Black or African American Female   History of Present Illness (Shannon Sultan A. [name] MD; 08/02/2020 10:22 AM) The patient is a 50 year old female who presents with breast cancer.  Chief complaint: Right breast cancer  She has now completed her neoadjuvant therapy. She has had an MRI which now shows a complete radiologic response with complete resolution of the mass in the breast as well as the enlarged lymph nodes. 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 She also had biopsy of lymph nodes which were positive as well. She has been on IV chemotherapy and plans will be after surgery to continue with Herceptin. She reports she is doing well.  Past Surgical History  Breast Biopsy  Bilateral. Gallbladder Surgery - Laparoscopic  Knee Surgery  Right. Tonsillectomy   Diagnostic Studies History  Colonoscopy  within last year Mammogram  within last year Pap Smear  1-5 years ago  Medication History  Medications Reconciled  Social History Alcohol use  Occasional alcohol use. Caffeine use  Tea. No drug use  Tobacco use  Never smoker.  Family History  Anesthetic complications  Family Members In General. Arthritis  Family Members In General. Bleeding disorder  Family Members In General. Cancer  Father. Cerebrovascular Accident  Family Members In General. Cervical Cancer  Family Members In General. Diabetes Mellitus  Mother. Heart Disease  Mother. Hypertension  Family Members In General, Mother. Ovarian Cancer  Family Members In General.  Pregnancy / Birth History  Age at menarche  72 years. Contraceptive History  Contraceptive implant, Oral contraceptives. Gravida  2 Length (months) of breastfeeding  12-24 Maternal age  70-25 Para   2 Regular periods   Other Problems  Back Pain  Cholelithiasis  Hemorrhoids  Hepatitis  High blood pressure  Lump In Breast  Thyroid Disease  Umbilical Hernia Repair     Review of Systems  General Present- Night Sweats. Not Present- Appetite Loss, Chills, Fatigue, Fever, Weight Gain and Weight Loss. HEENT Present- Wears glasses/contact lenses. Not Present- Earache, Hearing Loss, Hoarseness, Nose Bleed, Oral Ulcers, Ringing in the Ears, Seasonal Allergies, Sinus Pain, Sore Throat, Visual Disturbances and Yellow Eyes. Respiratory Not Present- Bloody sputum, Chronic Cough, Difficulty Breathing, Snoring and Wheezing. Breast Present- Breast Mass. Not Present- Breast Pain, Nipple Discharge and Skin Changes. Cardiovascular Present- Leg Cramps. Not Present- Chest Pain, Difficulty Breathing Lying Down, Palpitations, Rapid Heart Rate, Shortness of Breath and Swelling of Extremities. Female Genitourinary Not Present- Frequency, Nocturia, Painful Urination, Pelvic Pain and Urgency. Musculoskeletal Present- Back Pain, Joint Pain, Joint Stiffness and Muscle Weakness. Not Present- Muscle Pain and Swelling of Extremities. Neurological Present- Decreased Memory, Numbness and Tingling. Not Present- Fainting, Headaches, Seizures, Tremor, Trouble walking and Weakness. Psychiatric Not Present- Anxiety, Bipolar, Change in Sleep Pattern, Depression, Fearful and Frequent crying. Endocrine Present- Hair Changes and Hot flashes. Not Present- Cold Intolerance, Excessive Hunger, Heat Intolerance and New Diabetes. Hematology Not Present- Blood Thinners, Easy Bruising, Excessive bleeding, Gland problems, HIV and Persistent Infections. Allergies [name], CNA; 08/02/2020 9:45 AM) Amoxicillin *PENICILLINS*  Not specified Cipro *FLUOROQUINOLONES*  Not Specified Penicillins  Not Specified Allergies Reconciled   Medication History [name], CNA; 08/02/2020 9:45 AM) Cholecalciferol (1.25  MG(50000 UT) Capsule, Oral) Active. Lisinopril (20MG  Tablet, Oral) Active. Magnesium Citrate (200MG  Tablet, Oral) Active. Synthroid (75MCG Tablet, Oral) Active.  Medications Reconciled    Physical Exam ([name] A. [name] MD; 08/02/2020 10:22 AM) The physical exam findings are as follows: Note: She appears well exam  There are no palpable breast masses or axillary adenopathy on the right side.    Assessment & Plan ([name] A. 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 I have reviewed her previous mammograms, pathology results, and her most recent MRI. She has had a complete radiographic response to neoadjuvant therapy. The plan will now be to proceed to the operating room for radioactive seed guided right breast lumpectomy and targeted right axillary lymph node dissection. I discussed reasons for this with her husband in detail and they're ready to proceed with surgery. We discussed the risks of surgery which includes but is not limited to bleeding, infection, need for further procedures margins are positive, cardiopulmonary issues, postoperative seroma formation, postoperative recovery, etc. Surgery will be scheduled as soon as possible.

## 2020-08-20 ENCOUNTER — Ambulatory Visit (HOSPITAL_COMMUNITY): Admitting: Anesthesiology

## 2020-08-20 ENCOUNTER — Ambulatory Visit
Admission: RE | Admit: 2020-08-20 | Discharge: 2020-08-20 | Disposition: A | Discharge status: Home / Self Care | Source: Ambulatory Visit | Attending: Surgery | Admitting: Surgery

## 2020-08-20 ENCOUNTER — Encounter (HOSPITAL_COMMUNITY): Payer: Self-pay | Admitting: Surgery

## 2020-08-20 ENCOUNTER — Other Ambulatory Visit: Payer: Self-pay

## 2020-08-20 ENCOUNTER — Encounter (HOSPITAL_COMMUNITY)
Admission: RE | Disposition: A | Discharge status: Home / Self Care | Payer: Self-pay | Source: Home / Self Care | Attending: Surgery

## 2020-08-20 ENCOUNTER — Ambulatory Visit (HOSPITAL_COMMUNITY): Admitting: Vascular Surgery

## 2020-08-20 ENCOUNTER — Ambulatory Visit (HOSPITAL_COMMUNITY)
Admission: RE | Admit: 2020-08-20 | Discharge: 2020-08-20 | Disposition: A | Discharge status: Home / Self Care | Attending: Surgery | Admitting: Surgery

## 2020-08-20 DIAGNOSIS — C50911 Malignant neoplasm of unspecified site of right female breast: Secondary | ICD-10-CM | POA: Diagnosis present

## 2020-08-20 DIAGNOSIS — Z853 Personal history of malignant neoplasm of breast: Secondary | ICD-10-CM

## 2020-08-20 DIAGNOSIS — Z880 Allergy status to penicillin: Secondary | ICD-10-CM | POA: Insufficient documentation

## 2020-08-20 DIAGNOSIS — Z881 Allergy status to other antibiotic agents status: Secondary | ICD-10-CM | POA: Insufficient documentation

## 2020-08-20 DIAGNOSIS — Z8041 Family history of malignant neoplasm of ovary: Secondary | ICD-10-CM | POA: Insufficient documentation

## 2020-08-20 DIAGNOSIS — D241 Benign neoplasm of right breast: Secondary | ICD-10-CM | POA: Diagnosis not present

## 2020-08-20 DIAGNOSIS — D360 Benign neoplasm of lymph nodes: Secondary | ICD-10-CM | POA: Diagnosis not present

## 2020-08-20 DIAGNOSIS — Z809 Family history of malignant neoplasm, unspecified: Secondary | ICD-10-CM | POA: Diagnosis not present

## 2020-08-20 HISTORY — PX: BREAST LUMPECTOMY WITH RADIOACTIVE SEED LOCALIZATION: SHX6424

## 2020-08-20 HISTORY — PX: BREAST LUMPECTOMY: SHX2

## 2020-08-20 HISTORY — PX: RADIOACTIVE SEED GUIDED AXILLARY SENTINEL LYMPH NODE: SHX6735

## 2020-08-20 SURGERY — BREAST LUMPECTOMY WITH RADIOACTIVE SEED LOCALIZATION
Anesthesia: Regional | Site: Breast | Laterality: Right

## 2020-08-20 MED ORDER — CELECOXIB 200 MG PO CAPS
400.0000 mg | ORAL_CAPSULE | ORAL | Status: AC
  Administered 2020-08-20: 400 mg via ORAL
  Filled 2020-08-20: qty 2

## 2020-08-20 MED ORDER — MIDAZOLAM HCL 2 MG/2ML IJ SOLN: 2.0000 mg | Freq: Once | INTRAMUSCULAR | Status: AC

## 2020-08-20 MED ORDER — BUPIVACAINE HCL 0.5 % IJ SOLN
INTRAMUSCULAR | Status: DC | PRN
  Administered 2020-08-20: 20 mL

## 2020-08-20 MED ORDER — FENTANYL CITRATE (PF) 100 MCG/2ML IJ SOLN: 25.0000 ug | INTRAMUSCULAR | Status: DC | PRN

## 2020-08-20 MED ORDER — 0.9 % SODIUM CHLORIDE (POUR BTL) OPTIME
TOPICAL | Status: DC | PRN
  Administered 2020-08-20: 1000 mL

## 2020-08-20 MED ORDER — LIDOCAINE 2% (20 MG/ML) 5 ML SYRINGE
INTRAMUSCULAR | Status: DC | PRN
  Administered 2020-08-20: 100 mg via INTRAVENOUS

## 2020-08-20 MED ORDER — OXYCODONE HCL 5 MG PO TABS: 5.0000 mg | ORAL_TABLET | Freq: Once | ORAL | Status: DC | PRN

## 2020-08-20 MED ORDER — ACETAMINOPHEN 325 MG PO TABS
325.0000 mg | ORAL_TABLET | ORAL | Status: DC | PRN
Start: 2020-08-20 — End: 2020-08-20

## 2020-08-20 MED ORDER — FENTANYL CITRATE (PF) 250 MCG/5ML IJ SOLN
INTRAMUSCULAR | Status: AC
  Filled 2020-08-20: qty 5

## 2020-08-20 MED ORDER — BUPIVACAINE LIPOSOME 1.3 % IJ SUSP
INTRAMUSCULAR | Status: DC | PRN
  Administered 2020-08-20: 10 mL

## 2020-08-20 MED ORDER — PHENYLEPHRINE 40 MCG/ML (10ML) SYRINGE FOR IV PUSH (FOR BLOOD PRESSURE SUPPORT)
PREFILLED_SYRINGE | INTRAVENOUS | Status: DC | PRN
  Administered 2020-08-20: 160 ug via INTRAVENOUS
  Administered 2020-08-20 (×2): 120 ug via INTRAVENOUS
  Administered 2020-08-20 (×2): 80 ug via INTRAVENOUS
  Administered 2020-08-20: 160 ug via INTRAVENOUS

## 2020-08-20 MED ORDER — BUPIVACAINE HCL 0.25 % IJ SOLN
INTRAMUSCULAR | Status: DC | PRN
  Administered 2020-08-20: 10 mL

## 2020-08-20 MED ORDER — ONDANSETRON HCL 4 MG/2ML IJ SOLN: 4.0000 mg | Freq: Once | INTRAMUSCULAR | Status: DC | PRN

## 2020-08-20 MED ORDER — FENTANYL CITRATE (PF) 100 MCG/2ML IJ SOLN
INTRAMUSCULAR | Status: AC
  Administered 2020-08-20: 100 ug via INTRAVENOUS
  Filled 2020-08-20: qty 2

## 2020-08-20 MED ORDER — ONDANSETRON HCL 4 MG/2ML IJ SOLN
INTRAMUSCULAR | Status: AC
  Filled 2020-08-20: qty 2

## 2020-08-20 MED ORDER — MIDAZOLAM HCL 2 MG/2ML IJ SOLN
INTRAMUSCULAR | Status: AC
  Administered 2020-08-20: 2 mg via INTRAVENOUS
  Filled 2020-08-20: qty 2

## 2020-08-20 MED ORDER — ENSURE PRE-SURGERY PO LIQD
296.0000 mL | Freq: Once | ORAL | Status: DC
Start: 2020-08-21 — End: 2020-08-20

## 2020-08-20 MED ORDER — OXYCODONE HCL 5 MG PO TABS: 5.0000 mg | ORAL_TABLET | Freq: Four times a day (QID) | ORAL | 0 refills | Status: DC | PRN

## 2020-08-20 MED ORDER — DEXAMETHASONE SODIUM PHOSPHATE 10 MG/ML IJ SOLN
INTRAMUSCULAR | Status: DC | PRN
  Administered 2020-08-20: 10 mg via INTRAVENOUS

## 2020-08-20 MED ORDER — PROPOFOL 10 MG/ML IV BOLUS
INTRAVENOUS | Status: DC | PRN
  Administered 2020-08-20: 150 mg via INTRAVENOUS

## 2020-08-20 MED ORDER — ONDANSETRON HCL 4 MG/2ML IJ SOLN
INTRAMUSCULAR | Status: DC | PRN
  Administered 2020-08-20: 4 mg via INTRAVENOUS

## 2020-08-20 MED ORDER — CHLORHEXIDINE GLUCONATE 0.12 % MT SOLN
15.0000 mL | Freq: Once | OROMUCOSAL | Status: AC
  Administered 2020-08-20: 15 mL via OROMUCOSAL
  Filled 2020-08-20: qty 15

## 2020-08-20 MED ORDER — FENTANYL CITRATE (PF) 100 MCG/2ML IJ SOLN: 100.0000 ug | Freq: Once | INTRAMUSCULAR | Status: AC

## 2020-08-20 MED ORDER — ORAL CARE MOUTH RINSE: 15.0000 mL | Freq: Once | OROMUCOSAL | Status: AC

## 2020-08-20 MED ORDER — EPHEDRINE SULFATE-NACL 50-0.9 MG/10ML-% IV SOSY
PREFILLED_SYRINGE | INTRAVENOUS | Status: DC | PRN
  Administered 2020-08-20: 5 mg via INTRAVENOUS
  Administered 2020-08-20 (×3): 10 mg via INTRAVENOUS

## 2020-08-20 MED ORDER — CHLORHEXIDINE GLUCONATE CLOTH 2 % EX PADS: 6.0000 | MEDICATED_PAD | Freq: Once | CUTANEOUS | Status: DC

## 2020-08-20 MED ORDER — ACETAMINOPHEN 500 MG PO TABS
1000.0000 mg | ORAL_TABLET | ORAL | Status: AC
  Administered 2020-08-20: 1000 mg via ORAL
  Filled 2020-08-20: qty 2

## 2020-08-20 MED ORDER — LACTATED RINGERS IV SOLN: INTRAVENOUS | Status: DC

## 2020-08-20 MED ORDER — MEPERIDINE HCL 25 MG/ML IJ SOLN: 6.2500 mg | INTRAMUSCULAR | Status: DC | PRN

## 2020-08-20 MED ORDER — CEFAZOLIN SODIUM-DEXTROSE 2-4 GM/100ML-% IV SOLN
2.0000 g | INTRAVENOUS | Status: AC
  Administered 2020-08-20: 2 g via INTRAVENOUS
  Filled 2020-08-20: qty 100

## 2020-08-20 MED ORDER — CEFAZOLIN SODIUM 1 G IJ SOLR
INTRAMUSCULAR | Status: AC
  Filled 2020-08-20: qty 20

## 2020-08-20 MED ORDER — OXYCODONE HCL 5 MG/5ML PO SOLN: 5.0000 mg | Freq: Once | ORAL | Status: DC | PRN

## 2020-08-20 MED ORDER — PROPOFOL 10 MG/ML IV BOLUS
INTRAVENOUS | Status: AC
  Filled 2020-08-20: qty 20

## 2020-08-20 MED ORDER — PHENYLEPHRINE 40 MCG/ML (10ML) SYRINGE FOR IV PUSH (FOR BLOOD PRESSURE SUPPORT)
PREFILLED_SYRINGE | INTRAVENOUS | Status: AC
  Filled 2020-08-20: qty 10

## 2020-08-20 MED ORDER — ACETAMINOPHEN 160 MG/5ML PO SOLN
325.0000 mg | ORAL | Status: DC | PRN
Start: 2020-08-20 — End: 2020-08-20

## 2020-08-20 MED ORDER — DEXAMETHASONE SODIUM PHOSPHATE 10 MG/ML IJ SOLN
INTRAMUSCULAR | Status: AC
  Filled 2020-08-20: qty 1

## 2020-08-20 SURGICAL SUPPLY — 35 items
ADH SKN CLS APL DERMABOND .7 (GAUZE/BANDAGES/DRESSINGS) ×1
APL PRP STRL LF DISP 70% ISPRP (MISCELLANEOUS) ×1
APPLIER CLIP 9.375 MED OPEN (MISCELLANEOUS) ×2
APR CLP MED 9.3 20 MLT OPN (MISCELLANEOUS) ×1
BINDER BREAST LRG (GAUZE/BANDAGES/DRESSINGS) IMPLANT
BINDER BREAST XLRG (GAUZE/BANDAGES/DRESSINGS) IMPLANT
CANISTER SUCT 3000ML PPV (MISCELLANEOUS) ×2 IMPLANT
CHLORAPREP W/TINT 26 (MISCELLANEOUS) ×2 IMPLANT
CLIP APPLIE 9.375 MED OPEN (MISCELLANEOUS) ×1 IMPLANT
COVER PROBE W GEL 5X96 (DRAPES) ×2 IMPLANT
COVER SURGICAL LIGHT HANDLE (MISCELLANEOUS) ×2 IMPLANT
COVER WAND RF STERILE (DRAPES) ×2 IMPLANT
DERMABOND ADVANCED (GAUZE/BANDAGES/DRESSINGS) ×1
DERMABOND ADVANCED .7 DNX12 (GAUZE/BANDAGES/DRESSINGS) ×1 IMPLANT
DEVICE DUBIN SPECIMEN MAMMOGRA (MISCELLANEOUS) ×2 IMPLANT
DRAPE CHEST BREAST 15X10 FENES (DRAPES) ×2 IMPLANT
ELECT CAUTERY BLADE 6.4 (BLADE) ×2 IMPLANT
ELECT REM PT RETURN 9FT ADLT (ELECTROSURGICAL) ×2
ELECTRODE REM PT RTRN 9FT ADLT (ELECTROSURGICAL) ×1 IMPLANT
GLOVE SURG SIGNA 7.5 PF LTX (GLOVE) ×2 IMPLANT
GOWN STRL REUS W/ TWL LRG LVL3 (GOWN DISPOSABLE) ×1 IMPLANT
GOWN STRL REUS W/ TWL XL LVL3 (GOWN DISPOSABLE) ×1 IMPLANT
GOWN STRL REUS W/TWL LRG LVL3 (GOWN DISPOSABLE) ×2
GOWN STRL REUS W/TWL XL LVL3 (GOWN DISPOSABLE) ×2
KIT BASIN OR (CUSTOM PROCEDURE TRAY) ×2 IMPLANT
KIT MARKER MARGIN INK (KITS) ×2 IMPLANT
NDL HYPO 25GX1X1/2 BEV (NEEDLE) ×1 IMPLANT
NEEDLE HYPO 25GX1X1/2 BEV (NEEDLE) ×2 IMPLANT
NS IRRIG 1000ML POUR BTL (IV SOLUTION) IMPLANT
PACK GENERAL/GYN (CUSTOM PROCEDURE TRAY) ×2 IMPLANT
SUT MNCRL AB 4-0 PS2 18 (SUTURE) ×2 IMPLANT
SUT VIC AB 3-0 SH 18 (SUTURE) ×2 IMPLANT
SYR CONTROL 10ML LL (SYRINGE) ×2 IMPLANT
TOWEL GREEN STERILE (TOWEL DISPOSABLE) ×2 IMPLANT
TOWEL GREEN STERILE FF (TOWEL DISPOSABLE) ×2 IMPLANT

## 2020-08-20 NOTE — Transfer of Care (Signed)
Immediate Anesthesia Transfer of Care Note  Patient: Shannon Kane  Procedure(s) Performed: RADIOACTIVE SEED GUIDED RIGHT BREAST LUMPECTOMY (Right Breast) RADIOACTIVE SEED TARGETED RIGHT AXILLARY LYMPH NODE DISSECTION (Right Breast)  Patient Location: PACU  Anesthesia Type:General and Regional  Level of Consciousness: alert , oriented, drowsy, patient cooperative and responds to stimulation  Airway & Oxygen Therapy: Patient Spontanous Breathing and Patient connected to face mask oxygen  Post-op Assessment: Report given to RN, Post -op Vital signs reviewed and stable and Patient moving all extremities X 4  Post vital signs: Reviewed and stable  Last Vitals:  Vitals Value Taken Time  BP 139/84 08/20/20 1217  Temp 36.5 C 08/20/20 1215  Pulse 104 08/20/20 1218  Resp 15 08/20/20 1218  SpO2 100 % 08/20/20 1218  Vitals shown include unvalidated device data.  Last Pain:  Vitals:   08/20/20 1110  TempSrc:   PainSc: 0-No pain         Complications: No complications documented.

## 2020-08-20 NOTE — Op Note (Signed)
RADIOACTIVE SEED GUIDED RIGHT BREAST LUMPECTOMY, RADIOACTIVE SEED TARGETED RIGHT AXILLARY LYMPH NODE DISSECTION  Procedure Note  Shannon Kane 08/20/2020   Pre-op Diagnosis: RIGHT BREAST CANCER     Post-op Diagnosis: same  Procedure(s): RADIOACTIVE SEED GUIDED RIGHT BREAST LUMPECTOMY RADIOACTIVE SEED TARGETED RIGHT AXILLARY LYMPH NODE DISSECTION  Surgeon(s): Coralie Keens, MD  Anesthesia: General  Staff:  Circulator: Paulette Blanch, RN Scrub Person: Lovett Sox, CST Circulator Assistant: Philomena Doheny, RN  Estimated Blood Loss: Minimal               Specimens: sent to path  Indications: This is a 50 year old female was found to have an abnormality in the right breast on screening mammography.  She underwent a biopsy of this mass as well as enlarged lymph node showing invasive ductal carcinoma.  The targeted lymph node was positive for malignancy as well.  She underwent an MRI which showed another satellite lesion adjacent to this area which was biopsied showing DCIS.  She received neoadjuvant therapy and had a complete radiologic response on follow-up MRI.  The decision was then made to proceed with radioactive seed guided lumpectomy and targeted lymph node dissection  Findings: The radioactive seed it was between the 2 no malignancies as well as the radioactive seed and the targeted lymph node were only 4 cm apart so all were included in the single lumpectomy specimen.  I then took separate adjacent lymph nodes and sent to pathology separately and also excised to further medial margin  Procedure: Patient was brought to operating device correct patient.  She is placed upon the operating table general esthesia was induced.  Her right breast and axilla were prepped and draped in usual sterile fashion.  Using neoprobe I localized the 2 radioactive seeds which were almost in the axilla and the right upper outer quadrant of the breast.  I anesthetized skin with Marcaine and made a  longitudinal incision in the patient's axilla with a scalpel.  I then dissected down to the breast tissue and axilla with the electrocautery.  With the aid of neoprobe I then dissected medially toward the radioactive seed at the malignancy site.  I did go medial to this and then down to the chest wall.  The seed and tissue markers themselves were almost on the chest wall.  I stayed around this and then worked back toward the axilla.  The target lymph node was quite superficial and again only 4 cm away from the other radioactive seed so I included the targeted lymph node and some axillary tissue in the lumpectomy specimen.  I then completely remove the specimen.  All margins were marked with pain.  We x-rayed the specimen confirming that the 2 previous biopsy clips in the breast as well as the biopsy clip and the lymph node were all present as well as the 2 radioactive seeds.  This was sent to pathology for evaluation.  I then took some further medial margin with the cautery and sent his pathology as well.  I palpated the axilla and there were several small lymph nodes adjacent to the target lymph node which I excised with the cautery and sent to pathology as well.  At this point hemostasis appeared to be achieved.  Apply surgical clips at the lumpectomy site for marker purposes.  I then closed subtenons tissue with interrupted 3-0 Vicryl sutures and closed the skin with a running 4-0 Monocryl.  Dermabond was then applied.  The patient tolerated the procedure well.  All the counts were correct at the end of the procedure.  The patient was then extubated in the operating room and taken in stable addition to the recovery room.          Coralie Keens   Date: 08/20/2020  Time: 12:06 PM

## 2020-08-20 NOTE — Discharge Instructions (Signed)
[facility] °Office Phone Number [phone] ° °BREAST BIOPSY/ PARTIAL MASTECTOMY: POST OP INSTRUCTIONS ° °Always review your discharge instruction sheet given to you by the facility where your surgery was performed. ° °IF YOU HAVE DISABILITY OR FAMILY LEAVE FORMS, YOU MUST BRING THEM TO THE OFFICE FOR PROCESSING.  DO NOT GIVE THEM TO YOUR DOCTOR. ° °1. A prescription for pain medication may be given to you upon discharge.  Take your pain medication as prescribed, if needed.  If narcotic pain medicine is not needed, then you may take acetaminophen (Tylenol) or ibuprofen (Advil) as needed. °2. Take your usually prescribed medications unless otherwise directed °3. If you need a refill on your pain medication, please contact your pharmacy.  They will contact our office to request authorization.  Prescriptions will not be filled after 5pm or on week-ends. °4. You should eat very light the first 24 hours after surgery, such as soup, crackers, pudding, etc.  Resume your normal diet the day after surgery. °5. Most patients will experience some swelling and bruising in the breast.  Ice packs and a good support bra will help.  Swelling and bruising can take several days to resolve.  °6. It is common to experience some constipation if taking pain medication after surgery.  Increasing fluid intake and taking a stool softener will usually help or prevent this problem from occurring.  A mild laxative (Milk of Magnesia or Miralax) should be taken according to package directions if there are no bowel movements after 48 hours. °7. Unless discharge instructions indicate otherwise, you may remove your bandages 24-48 hours after surgery, and you may shower at that time.  You may have steri-strips (small skin tapes) in place directly over the incision.  These strips should be left on the skin for 7-10 days.  If your surgeon used skin glue on the incision, you may shower in 24 hours.  The glue will flake off over the  next 2-3 weeks.  Any sutures or staples will be removed at the office during your follow-up visit. °8. ACTIVITIES:  You may resume regular daily activities (gradually increasing) beginning the next day.  Wearing a good support bra or sports bra minimizes pain and swelling.  You may have sexual intercourse when it is comfortable. °a. You may drive when you no longer are taking prescription pain medication, you can comfortably wear a seatbelt, and you can safely maneuver your car and apply brakes. °b. RETURN TO WORK:  ______________________________________________________________________________________ °9. You should see your doctor in the office for a follow-up appointment approximately two weeks after your surgery.  Your doctor’s nurse will typically make your follow-up appointment when she calls you with your pathology report.  Expect your pathology report 2-3 business days after your surgery.  You may call to check if you do not hear from us after three days. °10. OTHER INSTRUCTIONS:OK TO REMOVE THE BINDER AND SHOWER STARTING TOMORROW °11. ICE PACK, TYLENOL, AND IBUPROFEN ALSO FOR PAIN °12. NO VIGOROUS ACTIVITY FOR ONE WEEK _______________________________________________________________________________________________ _____________________________________________________________________________________________________________________________________ °_____________________________________________________________________________________________________________________________________ °_____________________________________________________________________________________________________________________________________ ° °WHEN TO CALL YOUR DOCTOR: °1. Fever over 101.0 °2. Nausea and/or vomiting. °3. Extreme swelling or bruising. °4. Continued bleeding from incision. °5. Increased pain, redness, or drainage from the incision. ° °The clinic staff is available to answer your questions during regular business hours.   Please don’t hesitate to call and ask to speak to one of the nurses for clinical concerns.  If you have a medical emergency, go to the nearest emergency room or   call 911.  A surgeon from [facility] is always on call at the hospital. ° °For further questions, please visit [email or web address]  °

## 2020-08-20 NOTE — Anesthesia Postprocedure Evaluation (Signed)
Anesthesia Post Note  Patient: Shannon Kane  Procedure(s) Performed: RADIOACTIVE SEED GUIDED RIGHT BREAST LUMPECTOMY (Right Breast) RADIOACTIVE SEED TARGETED RIGHT AXILLARY LYMPH NODE DISSECTION (Right Breast)     Patient location during evaluation: PACU Anesthesia Type: Regional and General Level of consciousness: awake and alert Pain management: pain level controlled Vital Signs Assessment: post-procedure vital signs reviewed and stable Respiratory status: spontaneous breathing, nonlabored ventilation, respiratory function stable and patient connected to nasal cannula oxygen Cardiovascular status: blood pressure returned to baseline and stable Postop Assessment: no apparent nausea or vomiting Anesthetic complications: no   No complications documented.  Last Vitals:  Vitals:   08/20/20 1230 08/20/20 1245  BP: 133/83 137/89  Pulse: (!) 105 (!) 101  Resp: 14 14  Temp:  36.6 C  SpO2: 97% 97%    Last Pain:  Vitals:   08/20/20 1245  TempSrc:   PainSc: 0-No pain                 Shannon Kane

## 2020-08-20 NOTE — Anesthesia Procedure Notes (Signed)
Procedure Name: LMA Insertion Date/Time: 08/20/2020 11:21 AM Performed by: Hendricks Limes, CRNA Pre-anesthesia Checklist: Patient identified, Emergency Drugs available, Suction available and Patient being monitored Patient Re-evaluated:Patient Re-evaluated prior to induction Oxygen Delivery Method: Circle system utilized Preoxygenation: Pre-oxygenation with 100% oxygen Induction Type: IV induction Ventilation: Mask ventilation without difficulty LMA: LMA inserted LMA Size: 4.0 Number of attempts: 1 Placement Confirmation: positive ETCO2 and breath sounds checked- equal and bilateral Tube secured with: Tape Dental Injury: Teeth and Oropharynx as per pre-operative assessment

## 2020-08-20 NOTE — Interval H&P Note (Signed)
History and Physical Interval Note:no change in H and P  08/20/2020 10:34 AM  Shannon Kane  has presented today for surgery, with the diagnosis of RIGHT BREAST CANCER.  The various methods of treatment have been discussed with the patient and family. After consideration of risks, benefits and other options for treatment, the patient has consented to  Procedure(s): RIGHT BREAST LUMPECTOMY WITH RADIOACTIVE SEED LOCALIZATION (Right) RADIOACTIVE SEED GUIDED TARGETED AXILLARY SENTINEL LYMPH NODE DISSECTION (Right) as a surgical intervention.  The patient's history has been reviewed, patient examined, no change in status, stable for surgery.  I have reviewed the patient's chart and labs.  Questions were answered to the patient's satisfaction.     Coralie Keens

## 2020-08-20 NOTE — Anesthesia Procedure Notes (Addendum)
Anesthesia Regional Block: Pectoralis block   Pre-Anesthetic Checklist: ,, timeout performed, Correct Patient, Correct Site, Correct Laterality, Correct Procedure, Correct Position, site marked, Risks and benefits discussed,  Surgical consent,  Pre-op evaluation,  At surgeon's request and post-op pain management  Laterality: Right  Prep: chloraprep       Needles:  Injection technique: Single-shot  Needle Type: Echogenic Stimulator Needle     Needle Length: 5cm  Needle Gauge: 22     Additional Needles:   Procedures:, nerve stimulator,,, ultrasound used (permanent image in chart),,,,  Narrative:  Start time: 08/20/2020 11:10 AM End time: 08/20/2020 11:15 AM Injection made incrementally with aspirations every 5 mL.  Performed by: Personally  Anesthesiologist: Janeece Riggers, MD  Additional Notes: Functioning IV was confirmed and monitors were applied.  A 76mm 22ga Arrow echogenic stimulator needle was used. Sterile prep and drape,hand hygiene and sterile gloves were used. Ultrasound guidance: relevant anatomy identified, needle position confirmed, local anesthetic spread visualized around nerve(s)., vascular puncture avoided.  Image printed for medical record. Negative aspiration and negative test dose prior to incremental administration of local anesthetic. The patient tolerated the procedure well.

## 2020-08-20 NOTE — Anesthesia Procedure Notes (Signed)
Performed by: Hendricks Limes, CRNA

## 2020-08-21 ENCOUNTER — Encounter (HOSPITAL_COMMUNITY): Payer: Self-pay | Admitting: Surgery

## 2020-08-22 LAB — SURGICAL PATHOLOGY

## 2020-08-26 ENCOUNTER — Ambulatory Visit: Admitting: Hematology and Oncology

## 2020-08-26 ENCOUNTER — Encounter: Payer: Self-pay | Admitting: *Deleted

## 2020-08-26 NOTE — Progress Notes (Signed)
Patient Care Team: [name], MD as PCP - General (Internal Medicine) Rockwell Germany, RN as Oncology Nurse Navigator Mauro Kaufmann, RN as Oncology Nurse Navigator Coralie Keens, MD as Consulting Physician (General Surgery) Nicholas Lose, MD as Consulting Physician (Hematology and Oncology) [name], MD as Consulting Physician (Radiation Oncology)  DIAGNOSIS:    ICD-10-CM   1. Malignant neoplasm of upper-outer quadrant of right breast in female, estrogen receptor negative ([place])  C50.411    Z17.1     SUMMARY OF ONCOLOGIC HISTORY: Oncology History  Malignant neoplasm of upper-outer quadrant of right breast in female, estrogen receptor negative ([place])  03/28/2020 Initial Diagnosis   Patient palpated a right breast mass x2 wks. Mammogram and US showed a 2.0cm mass at the 10 o'clock position in the right breast and two enlarged right axillary lymph nodes, 2.0cm and 1.7cm. 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   04/16/2020 -  Chemotherapy    Patient is on Treatment Plan: BREAST  DOCETAXEL + CARBOPLATIN + TRASTUZUMAB + PERTUZUMAB  (TCHP) Q21D       04/18/2020 Genetic Testing   Negative genetic testing: no pathogenic variants detected in [facility] Multi-Cancer Panel.  Variants of uncertain significance detected in CDH1 at c.1289T>G (p.Val430Gly) and EGFR at c.2873G>A (p.Arg958His).  The report date is April 18, 2020.   The Multi-Cancer Panel offered by [facility] includes sequencing and/or deletion duplication testing of the following 85 genes: AIP, ALK, APC, ATM, AXIN2,BAP1,  BARD1, BLM, BMPR1A, BRCA1, BRCA2, BRIP1, CASR, CDC73, CDH1, CDK4, CDKN1B, CDKN1C, CDKN2A (p14ARF), CDKN2A (p16INK4a), CEBPA, CHEK2, CTNNA1, DICER1, DIS3L2, EGFR, EPCAM (Deletion/duplication testing only), FH, FLCN, GATA2, GPC3, GREM1 (Promoter region deletion/duplication testing only), HOXB13 (c.251G>A, p.Gly84Glu), HRAS, KIT, MAX, MEN1,  MET, MITF (c.952G>A, p.Glu318Lys variant only), MLH1, MSH2, MSH3, MSH6, MUTYH, NBN, NF1, NF2, NTHL1, PALB2, PDGFRA, PHOX2B, PMS2, POLD1, POLE, POT1, PRKAR1A, PTCH1, PTEN, RAD50, RAD51C, RAD51D, RB1, RECQL4, RET, RNF43, RUNX1, SDHAF2, SDHA (sequence changes only), SDHB, SDHC, SDHD, SMAD4, SMARCA4, SMARCB1, SMARCE1, STK11, SUFU, TERC, TERT, TMEM127, TP53, TSC1, TSC2, VHL, WRN and WT1.    08/20/2020 Surgery   Right lumpectomy [name]): no residual carcinoma, 7 right axillary lymph nodes negative for carcinoma.     CHIEF COMPLIANT: Follow-up s/p right lumpectomy   INTERVAL HISTORY: Shannon Kane is a 50 y.o. with above-mentioned history of right breast cancer who completed neoadjuvant chemotherapy. She underwent a right lumpectomy on 08/20/20 with Dr. [name] for which pathology showed no residual carcinoma, 7 right axillary lymph nodes negative for carcinoma.She presents to the clinic todayfor to discuss further treatment.   She is recovering very well from a recent surgery.  Denies any pain or discomfort.  She is here to talk about the future treatment plan.  ALLERGIES:  is allergic to amoxicillin, ciprofloxacin, and penicillins.  MEDICATIONS:  Current Outpatient Medications  Medication Sig Dispense Refill  . Cholecalciferol (VITAMIN D-3 PO) Take 6,000 Units by mouth daily.     . clotrimazole-betamethasone (LOTRISONE) cream clotrimazole-betamethasone 1 %-0.05 % topical cream  APPLY TO AFFECTED AREA TWICE A DAY MORNING AND EVENING FOR 2 WEEKS (Patient not taking: No sig reported)    . diphenoxylate-atropine (LOMOTIL) 2.5-0.025 MG tablet Take 1 tablet by mouth 4 (four) times daily as needed for diarrhea or loose stools. 30 tablet 2  . lidocaine-prilocaine (EMLA) cream Apply to affected area once (Patient taking differently: Apply 1 application topically daily as needed (port access). Apply to affected  area once) 30 g 3  . lisinopril (ZESTRIL) 20 MG tablet TAKE 1 TABLET DAILY (Patient taking  differently: Take 10 mg by mouth daily. Takes at night) 90 tablet 3  . LORazepam (ATIVAN) 0.5 MG tablet Take 1 tablet (0.5 mg total) by mouth at bedtime as needed for sleep. 30 tablet 0  . MAGNESIUM CITRATE PO Take 500 mcg by mouth at bedtime as needed (sleep). Only when having trouble sleeping    . ondansetron (ZOFRAN) 8 MG tablet Take 1 tablet (8 mg total) by mouth 2 (two) times daily as needed (Nausea or vomiting). Start on the third day after chemotherapy. 30 tablet 1  . oxyCODONE (OXY IR/ROXICODONE) 5 MG immediate release tablet Take 1-2 tablets (5-10 mg total) by mouth every 6 (six) hours as needed for moderate pain or severe pain. 25 tablet 0  . pantoprazole (PROTONIX) 20 MG tablet Take 1 tablet (20 mg total) by mouth daily. (Patient taking differently: Take 20 mg by mouth daily as needed for heartburn or indigestion.) 30 tablet 2  . potassium chloride (KLOR-CON) 10 MEQ tablet TAKE 1 TABLET BY MOUTH EVERY DAY 30 tablet 1  . prochlorperazine (COMPAZINE) 10 MG tablet Take 1 tablet (10 mg total) by mouth every 6 (six) hours as needed (Nausea or vomiting). 30 tablet 1  . SYNTHROID 75 MCG tablet TAKE 1 TABLET ON MONDAY THROUGH SUNDAY (Patient taking differently: Take 75 mcg by mouth daily before breakfast.) 90 tablet 3  . vitamin B-12 (CYANOCOBALAMIN) 1000 MCG tablet Take 1,000 mcg by mouth daily.     No current facility-administered medications for this visit.    PHYSICAL EXAMINATION: ECOG PERFORMANCE STATUS: 1 - Symptomatic but completely ambulatory  Vitals:   08/27/20 1450  BP: (!) 144/80  Pulse: 89  Resp: 15  Temp: (!) 97.3 F (36.3 C)  SpO2: 100%   Filed Weights   08/27/20 1450  Weight: 195 lb 12.8 oz (88.8 kg)    LABORATORY DATA:  I have reviewed the data as listed CMP Latest Ref Rng & Units 08/16/2020 07/30/2020 07/09/2020  Glucose 70 - 99 mg/dL 98 97 94  BUN 6 - 20 mg/dL 8 10 10   Creatinine 0.44 - 1.00 mg/dL 0.79 0.73 0.71  Sodium 135 - 145 mmol/L 141 143 142  Potassium  3.5 - 5.1 mmol/L 3.3(L) 3.1(L) 3.2(L)  Chloride 98 - 111 mmol/L 107 109 108  CO2 22 - 32 mmol/L 30 25 27   Calcium 8.9 - 10.3 mg/dL 8.7(L) 8.7(L) 8.5(L)  Total Protein 6.5 - 8.1 g/dL - 7.0 6.3(L)  Total Bilirubin 0.3 - 1.2 mg/dL - 0.3 0.4  Alkaline Phos 38 - 126 U/L - 84 70  AST 15 - 41 U/L - 19 29  ALT 0 - 44 U/L - 15 26    Lab Results  Component Value Date   WBC 4.3 08/16/2020   HGB 11.6 (L) 08/16/2020   HCT 35.8 (L) 08/16/2020   MCV 107.5 (H) 08/16/2020   PLT 131 (L) 08/16/2020   NEUTROABS 5.8 07/30/2020    ASSESSMENT & PLAN:  Malignant neoplasm of upper-outer quadrant of right breast in female, estrogen receptor negative ([place]) 03/28/2020:Patient palpated a right breast mass x2 wks. Mammogram and US showed a 2.0cm mass at the 10 o'clock position in the right breast and two enlarged right axillary lymph nodes, 2.0cm and 1.7cm. 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 Satellite lesion biopsy: DCIS ER 0%, PR  0%  Treatment plan: 1. Neoadjuvant chemotherapy with [place] Perjeta 6 cycles followed by HerceptinPerjeta versus Kadcylamaintenance for 1 year 2. 08/20/2020:Right lumpectomy [name]): no residual carcinoma, 7 right axillary lymph nodes negative for carcinoma. 3. Followed by adjuvant radiation therapy if patient had lumpectomy ----------------------------------------------------------------------------------------------------------------------------------------- Current treatment: Completed 6 cycles of TCH Perjeta, continue with Herceptin Perjeta maintenance Echocardiogram 04/09/2020: EF 60 to 65% She will need a new echocardiogram.  Refer for adjuvant radiation: Patient was very reluctant to consider radiation but she will have a consultation and then make a decision. Return to clinic every 3 weeks for Herceptin Perjeta and every 6 weeks with follow-up with me.    No orders of the defined types were placed in  this encounter.  The patient has a good understanding of the overall plan. she agrees with it. she will call with any problems that may develop before the next visit here.  Total time spent: 30 mins including face to face time and time spent for planning, charting and coordination of care  Rulon Eisenmenger, MD, MPH 08/27/2020  I, Molly Dorshimer, am acting as scribe for Dr. Nicholas Lose.  I have reviewed the above documentation for accuracy and completeness, and I agree with the above.

## 2020-08-27 ENCOUNTER — Inpatient Hospital Stay: Attending: Hematology and Oncology | Admitting: Hematology and Oncology

## 2020-08-27 ENCOUNTER — Other Ambulatory Visit: Payer: Self-pay

## 2020-08-27 VITALS — BP 144/80 | HR 89 | Temp 97.3°F | Resp 15 | Ht 69.0 in | Wt 195.8 lb

## 2020-08-27 DIAGNOSIS — Z171 Estrogen receptor negative status [ER-]: Secondary | ICD-10-CM

## 2020-08-27 DIAGNOSIS — Z5112 Encounter for antineoplastic immunotherapy: Secondary | ICD-10-CM | POA: Insufficient documentation

## 2020-08-27 DIAGNOSIS — C50411 Malignant neoplasm of upper-outer quadrant of right female breast: Secondary | ICD-10-CM | POA: Diagnosis not present

## 2020-08-27 DIAGNOSIS — Z780 Asymptomatic menopausal state: Secondary | ICD-10-CM

## 2020-08-27 DIAGNOSIS — Z5111 Encounter for antineoplastic chemotherapy: Secondary | ICD-10-CM | POA: Diagnosis present

## 2020-08-27 DIAGNOSIS — Z79899 Other long term (current) drug therapy: Secondary | ICD-10-CM | POA: Diagnosis not present

## 2020-08-27 NOTE — Assessment & Plan Note (Signed)
03/28/2020:Patient palpated a right breast mass x2 wks. Mammogram and US showed a 2.0cm mass at the 10 o'clock position in the right breast and two enlarged right axillary lymph nodes, 2.0cm and 1.7cm. 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 Satellite lesion biopsy: DCIS ER 0%, PR 0%  Treatment plan: 1. Neoadjuvant chemotherapy with [place] Perjeta 6 cycles followed by HerceptinPerjeta versus Kadcylamaintenance for 1 year 2. 08/20/2020:Right lumpectomy [name]): no residual carcinoma, 7 right axillary lymph nodes negative for carcinoma. 3. Followed by adjuvant radiation therapy if patient had lumpectomy ----------------------------------------------------------------------------------------------------------------------------------------- Current treatment: Completed 6 cycles of [place] Perjeta, continue with Herceptin Perjeta maintenance Echocardiogram 04/09/2020: EF 60 to 65% She will need a new echocardiogram.  Refer for adjuvant radiation. Return to clinic every 3 weeks for Herceptin Perjeta and every 6 weeks with follow-up with me.

## 2020-08-28 ENCOUNTER — Encounter: Payer: Self-pay | Admitting: *Deleted

## 2020-09-02 ENCOUNTER — Telehealth: Payer: Self-pay | Admitting: Hematology and Oncology

## 2020-09-02 NOTE — Telephone Encounter (Signed)
Scheduled appts per 4/12 los. Pt aware.

## 2020-09-03 ENCOUNTER — Encounter: Payer: Self-pay | Admitting: *Deleted

## 2020-09-04 NOTE — H&P (Signed)
Shannon Kane Appointment: 08/02/2020 9:50 AM Location: [facility] Patient #: [id number] DOB: 03-29-1971 Married / Language: English / Race: Black or African American Female   History of Present Illness (Shannon Kane A. [name] MD; 08/02/2020 10:22 AM) The patient is a 50 year old female who presents with breast cancer.  Chief complaint: Right breast cancer  She has now completed her neoadjuvant therapy. She has had an MRI which now shows a complete radiologic response with complete resolution of the mass in the breast as well as the enlarged lymph nodes. 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 She also had biopsy of lymph nodes which were positive as well. She has been on IV chemotherapy and plans will be after surgery to continue with Herceptin. She reports she is doing well.  Past Surgical History  Breast Biopsy  Bilateral. Gallbladder Surgery - Laparoscopic  Knee Surgery  Right. Tonsillectomy   Diagnostic Studies History  Colonoscopy  within last year Mammogram  within last year Pap Smear  1-5 years ago  Medication History  Medications Reconciled  Social History Alcohol use  Occasional alcohol use. Caffeine use  Tea. No drug use  Tobacco use  Never smoker.  Family History  Anesthetic complications  Family Members In General. Arthritis  Family Members In General. Bleeding disorder  Family Members In General. Cancer  Father. Cerebrovascular Accident  Family Members In General. Cervical Cancer  Family Members In General. Diabetes Mellitus  Mother. Heart Disease  Mother. Hypertension  Family Members In General, Mother. Ovarian Cancer  Family Members In General.  Pregnancy / Birth History  Age at menarche  40 years. Contraceptive History  Contraceptive implant, Oral contraceptives. Gravida  2 Length (months) of breastfeeding  12-24 Maternal age  54-25 Para   2 Regular periods   Other Problems  Back Pain  Cholelithiasis  Hemorrhoids  Hepatitis  High blood pressure  Lump In Breast  Thyroid Disease  Umbilical Hernia Repair     Review of Systems  General Present- Night Sweats. Not Present- Appetite Loss, Chills, Fatigue, Fever, Weight Gain and Weight Loss. HEENT Present- Wears glasses/contact lenses. Not Present- Earache, Hearing Loss, Hoarseness, Nose Bleed, Oral Ulcers, Ringing in the Ears, Seasonal Allergies, Sinus Pain, Sore Throat, Visual Disturbances and Yellow Eyes. Respiratory Not Present- Bloody sputum, Chronic Cough, Difficulty Breathing, Snoring and Wheezing. Breast Present- Breast Mass. Not Present- Breast Pain, Nipple Discharge and Skin Changes. Cardiovascular Present- Leg Cramps. Not Present- Chest Pain, Difficulty Breathing Lying Down, Palpitations, Rapid Heart Rate, Shortness of Breath and Swelling of Extremities. Female Genitourinary Not Present- Frequency, Nocturia, Painful Urination, Pelvic Pain and Urgency. Musculoskeletal Present- Back Pain, Joint Pain, Joint Stiffness and Muscle Weakness. Not Present- Muscle Pain and Swelling of Extremities. Neurological Present- Decreased Memory, Numbness and Tingling. Not Present- Fainting, Headaches, Seizures, Tremor, Trouble walking and Weakness. Psychiatric Not Present- Anxiety, Bipolar, Change in Sleep Pattern, Depression, Fearful and Frequent crying. Endocrine Present- Hair Changes and Hot flashes. Not Present- Cold Intolerance, Excessive Hunger, Heat Intolerance and New Diabetes. Hematology Not Present- Blood Thinners, Easy Bruising, Excessive bleeding, Gland problems, HIV and Persistent Infections. Allergies [name], CNA; 08/02/2020 9:45 AM) Amoxicillin *PENICILLINS*  Not specified Cipro *FLUOROQUINOLONES*  Not Specified Penicillins  Not Specified Allergies Reconciled   Medication History [name], CNA; 08/02/2020 9:45 AM) Cholecalciferol  (1.25 MG(50000 UT) Capsule, Oral) Active. Lisinopril (20MG  Tablet, Oral) Active. Magnesium Citrate (200MG  Tablet, Oral) Active. Synthroid (75MCG Tablet, Oral) Active. Medications  Reconciled    Physical Exam ([name] A. [name] MD; 08/02/2020 10:22 AM) The physical exam findings are as follows: Note: She appears well exam  There are no palpable breast masses or axillary adenopathy on the right side.  I DID NOT DO ANY OTHER EXAM AND I WILL NOT SAY THAT I DID    Assessment & Plan ([name] A. 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 I have reviewed her previous mammograms, pathology results, and her most recent MRI. She has had a complete radiographic response to neoadjuvant therapy. The plan will now be to proceed to the operating room for radioactive seed guided right breast lumpectomy and targeted right axillary lymph node dissection. I discussed reasons for this with her husband in detail and they're ready to proceed with surgery. We discussed the risks of surgery which includes but is not limited to bleeding, infection, need for further procedures margins are positive, cardiopulmonary issues, postoperative seroma formation, postoperative recovery, etc. Surgery will be scheduled as soon as possible.

## 2020-09-09 ENCOUNTER — Other Ambulatory Visit: Payer: Self-pay | Admitting: *Deleted

## 2020-09-09 ENCOUNTER — Encounter: Payer: Self-pay | Admitting: Physical Therapy

## 2020-09-09 ENCOUNTER — Other Ambulatory Visit: Payer: Self-pay

## 2020-09-09 ENCOUNTER — Ambulatory Visit: Attending: Hematology and Oncology | Admitting: Physical Therapy

## 2020-09-09 DIAGNOSIS — Z483 Aftercare following surgery for neoplasm: Secondary | ICD-10-CM

## 2020-09-09 DIAGNOSIS — Z171 Estrogen receptor negative status [ER-]: Secondary | ICD-10-CM

## 2020-09-09 DIAGNOSIS — C50411 Malignant neoplasm of upper-outer quadrant of right female breast: Secondary | ICD-10-CM

## 2020-09-09 NOTE — Patient Instructions (Addendum)
            [facility]         [address], [place] [address]         [phone]         Annia Friendly, PT, CLT   After Breast Cancer Class It is recommended you attend the ABC class to be educated on lymphedema risk reduction. This class is free of charge and lasts for 1 hour. It is a 1-time class.  You are scheduled for May 2nd at 11:00. We will send you a link in your email and it's through [employer].  Scar massage You can begin gentle scar massage to the armpit incision a few minutes each day and then rub coconut oil or vitamin E cream on the area.   Compression garment You should be wearing a really good fitting sports bra until you have no discomfort while exercising.   Home exercise Program Continue the exercises for your arm until you can reach all directions without any tightness.   Follow up PT: It is recommended you return every 3 months for the first 3 years following surgery to be assessed on the SOZO machine for an L-Dex score. This helps prevent clinically significant lymphedema in 95% of patients. These follow up screens are 15 minute appointments that you are not billed for. You are scheduled for July 25th at 8:40. We will be at our new clinic which is [address] [place] [address].  Axillary web syndrome (also called cording) can happen after having breast cancer surgery when lymph nodes in the armpit are removed. It presents as if you have a thin cord in your arm and can run from the armpit all the way down into the forearm. If you've had a sentinel node biopsy, the risk is 1-20% and if you've had an axillary lymph node dissection (more than 7 nodes removed), the risk is 36-72%. The ranges vary depending on the research study.  It most often happens 3-4 weeks post-op but can happen sooner or later. There are several possibilities for what cording actually is. Although no one knows for sure as of yet, it may be  related to lymphatics, veins, or other tissue. Sometimes cording resolves on its own but other times it requires physical therapy with a therapist who specializes in lymphedema and/or cancer rehab. Treatment typically involves stretching, manual techniques, and exercise. Sometimes cords get "released" while stretching or during manual treatment and the patient may experience the sensation of a "pop." This may feel strange but it is not dangerous and is a sign that the cord has released; range of motion may be improved in the process.  Closed Chain: Shoulder Abduction / Adduction - on Wall    One hand on wall, step to side and return. Stepping causes shoulder to abduct and adduct. Step _5__ times, holding 5 seconds, _2-3__ times per day.  http://ss.exer.us/267   Copyright  VHI. All rights reserved.

## 2020-09-09 NOTE — Therapy (Signed)
[place], [place], [address] Phone: [phone]   Fax:  [phone]  Physical Therapy Treatment  Patient Details  Name: Shannon Kane MRN: [id number] Date of Birth: Apr 13, 1971 Referring Provider (PT): Dr. Coralie Keens   Encounter Date: 09/09/2020   PT End of Session - 09/09/20 1642    Visit Number 2    Number of Visits 2    PT Start Time 1536    PT Stop Time 1632    PT Time Calculation (min) 56 min    Activity Tolerance Patient tolerated treatment well    Behavior During Therapy [facility] for tasks assessed/performed           Past Medical History:  Diagnosis Date  . Abnormal glucose   . Breast cancer ([place]) 03/2020   right breast IDC  . Family history of breast cancer 04/03/2020  . Family history of ovarian cancer 04/03/2020  . Hypertension   . Hyperthyroidism    in the past  . Hypothyroidism   . Iron deficiency anemia   . Palpitation   . Spontaneous ecchymoses   . Tachycardia   . Vitamin D deficiency     Past Surgical History:  Procedure Laterality Date  . BREAST BIOPSY    . BREAST EXCISIONAL BIOPSY    . BREAST LUMPECTOMY WITH RADIOACTIVE SEED LOCALIZATION Right 08/20/2020   Procedure: RADIOACTIVE SEED GUIDED RIGHT BREAST LUMPECTOMY;  Surgeon: Coralie Keens, MD;  Location: [facility];  Service: General;  Laterality: Right;  . CHOLECYSTECTOMY    . COLONOSCOPY  05/18/2018  . KNEE SURGERY    . PORTACATH PLACEMENT Left 04/15/2020   Procedure: INSERTION PORT-A-CATH;  Surgeon: Coralie Keens, MD;  Location: [facility];  Service: General;  Laterality: Left;  . RADIOACTIVE SEED GUIDED AXILLARY SENTINEL LYMPH NODE Right 08/20/2020   Procedure: RADIOACTIVE SEED TARGETED RIGHT AXILLARY LYMPH NODE DISSECTION;  Surgeon: Coralie Keens, MD;  Location: [facility];  Service: General;  Laterality: Right;  . TONSILLECTOMY      There were no vitals filed for this visit.   Subjective  Assessment - 09/09/20 1540    Subjective Patient reports she had a right lumpectomy and targeted node dissection (7 negative lymph nodes) on 08/20/2020. She underwent neoadjuvant chemotherapy from 04/16/2020 - 07/30/2020 and she will continue Herceptin and Perjeta. She has a consult for radiation on 09/18/2020.    Pertinent History Patient was diagnosed on 03/14/2020 with right grade 2 invasive ductal carcinoma breast cancer.  She underwent neoadjuvant chemo followed by a right lumpectomy and targeted node dissection (7 negative lymph nodes) on 08/20/2020. It is ER/PR negative and HER2 positive with a Ki67 of 30%. She has a positive axillary lymph node.    Patient Stated Goals See how my arm is doing    Currently in Pain? Yes    Pain Score 8    Up to 8/10 but right now 0/10   Pain Location Axilla    Pain Orientation Right    Pain Descriptors / Indicators Other (Comment)   Sensitivity   Pain Type Surgical pain    Pain Onset 1 to 4 weeks ago    Pain Frequency Intermittent    Aggravating Factors  Reaching behind and up back    Pain Relieving Factors Nothing; it's random              [facility] PT Assessment - 09/09/20 0001      Assessment   Medical Diagnosis s/p right lumpectomy and TAD  Referring Provider (PT) Dr. Coralie Keens    Onset Date/Surgical Date 08/20/20    Hand Dominance Left    Prior Therapy Baselines      Precautions   Precautions Other (comment)    Precaution Comments recent surgery and right arm lymphedema risk      Restrictions   Weight Bearing Restrictions No      Balance Screen   Has the patient fallen in the past 6 months No    Has the patient had a decrease in activity level because of a fear of falling?  No    Is the patient reluctant to leave their home because of a fear of falling?  No      Home Environment   Living Environment Private residence    Living Arrangements Spouse/significant other;Children   Husband and 23 y.o. 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      Cognition   Overall Cognitive Status Within Functional Limits for tasks assessed      Observation/Other Assessments   Observations Incision in axilla (her only incision) appears to be well helaed but is slightly tender to palpation along incision. No edema or redness noted. Palpable but not visible axillary cording present.      Posture/Postural Control   Posture/Postural Control No significant limitations      ROM / Strength   AROM / PROM / Strength AROM      AROM   AROM Assessment Site Shoulder    Right/Left Shoulder Right    Right Shoulder Extension 47 Degrees    Right Shoulder Flexion 157 Degrees    Right Shoulder ABduction 171 Degrees    Right Shoulder Internal Rotation 80 Degrees    Right Shoulder External Rotation 90 Degrees      Strength   Overall Strength Unable to assess;Due to precautions      Palpation   Palpation comment Very thin small cord present in right axilla which she reports is tender in nature but does not feel it limits ROM             LYMPHEDEMA/ONCOLOGY QUESTIONNAIRE - 09/09/20 0001      Type   Cancer Type Right breast cancer      Surgeries   Lumpectomy Date 08/20/20    Sentinel Lymph Node Biopsy Date 08/20/20    Number Lymph Nodes Removed 7      Treatment   Active Chemotherapy Treatment No    Past Chemotherapy Treatment Yes    Date 07/30/20    Active Radiation Treatment No    Past Radiation Treatment No    Current Hormone Treatment No    Past Hormone Therapy No      What other symptoms do you have   Are you Having Heaviness or Tightness Yes    Are you having Pain Yes    Are you having pitting edema No    Is it Hard or Difficult finding clothes  that fit No    Do you have infections No    Is there Decreased scar mobility Yes    Stemmer Sign No      Lymphedema Assessments   Lymphedema Assessments Upper extremities  Right Upper Extremity Lymphedema   10 cm Proximal to Olecranon Process 30 cm    Olecranon Process 26.5 cm    10 cm Proximal to Ulnar Styloid Process 20.9 cm    Just Proximal to Ulnar Styloid Process 15.1 cm    Across Hand at [employer] 19 cm    At [place] of 2nd Digit 5.9 cm      Left Upper Extremity Lymphedema   10 cm Proximal to Olecranon Process 32.2 cm    Olecranon Process 26.9 cm    10 cm Proximal to Ulnar Styloid Process 22.5 cm    Just Proximal to Ulnar Styloid Process 16.2 cm    Across Hand at [employer] 19.8 cm    At [place] of 2nd Digit 5.9 cm              Quick Dash - 09/09/20 0001    Open a tight or new jar Moderate difficulty    Do heavy household chores (wash walls, wash floors) Moderate difficulty    Carry a shopping bag or briefcase Mild difficulty    Wash your back Moderate difficulty    Use a knife to cut food No difficulty    Recreational activities in which you take some force or impact through your arm, shoulder, or hand (golf, hammering, tennis) Moderate difficulty    During the past week, to what extent has your arm, shoulder or hand problem interfered with your normal social activities with family, friends, neighbors, or groups? Slightly    During the past week, to what extent has your arm, shoulder or hand problem limited your work or other regular daily activities Slightly    Arm, shoulder, or hand pain. Mild    Tingling (pins and needles) in your arm, shoulder, or hand Moderate    Difficulty Sleeping Mild difficulty    DASH Score 34.09 %                          PT Education - 09/09/20 1636    Education Details Aftercare including scar massage, education about lymphedema and axillary cording    Person(s) Educated Patient    Methods  Explanation;Demonstration;Handout    Comprehension Returned demonstration;Verbalized understanding               PT Long Term Goals - 09/09/20 1646      PT LONG TERM GOAL #1   Title Patient will demonstrate she has regained full shoulder ROM and function post operatively compared to baselines.    Time 6    Period Months    Status Achieved                 Plan - 09/09/20 1642    Clinical Impression Statement Patient is recovering well s/p right lumpectomy and targeted node dissection when 7 negative nodes were removed. She first underwent neoadjuvant chemotherapy 04/16/2020 - 07/30/2020. She plans to undergo radiation in a few weeks. She has some very mild axillary cording present but was instructed in a stretch to improve that. Overall, her ROM and function is back to baseline and she has returned to work and exercising. We discussed her weight gain which happened during chemotherapy and how that relates to an increase in lymphedema risk as she will be at somewhat high risk due to radiation to the axillary region as she had a positive lymph node at initial diagnosis. She is doing well and plans to participate in  the After Breast Cancer class on 09/16/2020 for lymphedema education but otherwise has no need for PT at this time.    PT Treatment/Interventions ADLs/Self Care Home Management;Therapeutic exercise;Patient/family education    PT Next Visit Plan D/C    PT Home Exercise Plan Post op shoulder ROM HEP    Consulted and Agree with Plan of Care Patient           Patient will benefit from skilled therapeutic intervention in order to improve the following deficits and impairments:  Postural dysfunction,Decreased range of motion,Impaired UE functional use,Pain,Decreased knowledge of precautions  Visit Diagnosis: Malignant neoplasm of upper-outer quadrant of right breast in female, estrogen receptor negative [facility])  Aftercare following surgery for neoplasm     Problem  List Patient Active Problem List   Diagnosis Date Noted  . Port-A-Cath in place 05/07/2020  . Menorrhagia 04/19/2020  . Uterine leiomyoma 04/19/2020  . Genetic testing 04/19/2020  . Family history of ovarian cancer 04/03/2020  . Family history of leukemia 04/03/2020  . Family history of breast cancer 04/03/2020  . Malignant neoplasm of upper-outer quadrant of right breast in female, estrogen receptor negative ([place]) 03/28/2020  . Infiltrating ductal carcinoma of breast ([place]) 03/27/2020  . Overweight (BMI 25.0-29.9) 12/05/2019  . Essential hypertension, benign 05/29/2018  . Iritis of left eye 05/29/2018  . Iron deficiency anemia due to chronic blood loss 05/29/2018  . Primary hypothyroidism 05/29/2018  . Stress incontinence in female 02/16/2016   PHYSICAL THERAPY DISCHARGE SUMMARY  Visits from Start of Care: 2  Current functional level related to goals / functional outcomes: Goals met. See above for objective measurements.   Remaining deficits: Mild axillary cording which is asymptomatic   Education / Equipment: HEP and lymphedema risk reduction education Plan: Patient agrees to discharge.  Patient goals were met. Patient is being discharged due to not returning since the last visit.  ?????         Annia Friendly, [place] 09/09/20 4:53 PM  [place], [place], [address] Phone: [phone]   Fax:  [phone]  Name: Shannon Kane MRN: [id number] Date of Birth: [birth year]

## 2020-09-10 ENCOUNTER — Inpatient Hospital Stay

## 2020-09-10 ENCOUNTER — Other Ambulatory Visit: Payer: Self-pay | Admitting: *Deleted

## 2020-09-10 VITALS — BP 142/94 | HR 75 | Temp 98.4°F | Resp 16 | Wt 195.8 lb

## 2020-09-10 DIAGNOSIS — C50411 Malignant neoplasm of upper-outer quadrant of right female breast: Secondary | ICD-10-CM

## 2020-09-10 DIAGNOSIS — Z5111 Encounter for antineoplastic chemotherapy: Secondary | ICD-10-CM | POA: Diagnosis not present

## 2020-09-10 DIAGNOSIS — Z171 Estrogen receptor negative status [ER-]: Secondary | ICD-10-CM

## 2020-09-10 LAB — CMP (CANCER CENTER ONLY)
ALT: 12 U/L (ref 0–44)
AST: 20 U/L (ref 15–41)
Albumin: 3.8 g/dL (ref 3.5–5.0)
Alkaline Phosphatase: 88 U/L (ref 38–126)
Anion gap: 9 (ref 5–15)
BUN: 7 mg/dL (ref 6–20)
CO2: 26 mmol/L (ref 22–32)
Calcium: 9.2 mg/dL (ref 8.9–10.3)
Chloride: 108 mmol/L (ref 98–111)
Creatinine: 0.78 mg/dL (ref 0.44–1.00)
GFR, Estimated: >60 mL/min (ref >60)
Glucose, Bld: 95 mg/dL (ref 70–99)
Potassium: 3.9 mmol/L (ref 3.5–5.1)
Sodium: 143 mmol/L (ref 135–145)
Total Bilirubin: 0.3 mg/dL (ref 0.3–1.2)
Total Protein: 7.2 g/dL (ref 6.5–8.1)

## 2020-09-10 LAB — CBC WITH DIFFERENTIAL (CANCER CENTER ONLY)
Abs Immature Granulocytes: 0 10*3/uL (ref 0.00–0.07)
Basophils Absolute: 0 10*3/uL (ref 0.0–0.1)
Basophils Relative: 1 %
Eosinophils Absolute: 0 10*3/uL (ref 0.0–0.5)
Eosinophils Relative: 1 %
HCT: 38.7 % (ref 36.0–46.0)
Hemoglobin: 12.6 g/dL (ref 12.0–15.0)
Immature Granulocytes: 0 %
Lymphocytes Relative: 31 %
Lymphs Abs: 1.3 10*3/uL (ref 0.7–4.0)
MCH: 33.2 pg (ref 26.0–34.0)
MCHC: 32.6 g/dL (ref 30.0–36.0)
MCV: 101.8 fL — ABNORMAL HIGH (ref 80.0–100.0)
Monocytes Absolute: 0.4 10*3/uL (ref 0.1–1.0)
Monocytes Relative: 10 %
Neutro Abs: 2.3 10*3/uL (ref 1.7–7.7)
Neutrophils Relative %: 57 %
Platelet Count: 213 10*3/uL (ref 150–400)
RBC: 3.8 MIL/uL — ABNORMAL LOW (ref 3.87–5.11)
RDW: 13.3 % (ref 11.5–15.5)
WBC Count: 4.1 10*3/uL (ref 4.0–10.5)
nRBC: 0 % (ref 0.0–0.2)

## 2020-09-10 MED ORDER — SODIUM CHLORIDE 0.9 % IV SOLN
Freq: Once | INTRAVENOUS | Status: AC
Start: 2020-09-10 — End: 2020-09-10
  Filled 2020-09-10: qty 250

## 2020-09-10 MED ORDER — SODIUM CHLORIDE 0.9% FLUSH
10.0000 mL | INTRAVENOUS | Status: DC | PRN
  Administered 2020-09-10: 10 mL
  Filled 2020-09-10: qty 10

## 2020-09-10 MED ORDER — SODIUM CHLORIDE 0.9 % IV SOLN
420.0000 mg | Freq: Once | INTRAVENOUS | Status: AC
  Administered 2020-09-10: 420 mg via INTRAVENOUS
  Filled 2020-09-10: qty 14

## 2020-09-10 MED ORDER — TRASTUZUMAB-DKST CHEMO 150 MG IV SOLR
6.0000 mg/kg | Freq: Once | INTRAVENOUS | Status: AC
  Administered 2020-09-10: 525 mg via INTRAVENOUS
  Filled 2020-09-10: qty 25

## 2020-09-10 MED ORDER — DIPHENHYDRAMINE HCL 25 MG PO CAPS
50.0000 mg | ORAL_CAPSULE | Freq: Once | ORAL | Status: AC
  Administered 2020-09-10: 50 mg via ORAL

## 2020-09-10 MED ORDER — DIPHENHYDRAMINE HCL 25 MG PO CAPS
ORAL_CAPSULE | ORAL | Status: AC
  Filled 2020-09-10: qty 2

## 2020-09-10 MED ORDER — ACETAMINOPHEN 325 MG PO TABS
ORAL_TABLET | ORAL | Status: AC
  Filled 2020-09-10: qty 2

## 2020-09-10 MED ORDER — HEPARIN SOD (PORK) LOCK FLUSH 100 UNIT/ML IV SOLN
500.0000 [IU] | Freq: Once | INTRAVENOUS | Status: AC | PRN
  Administered 2020-09-10: 500 [IU]
  Filled 2020-09-10: qty 5

## 2020-09-10 MED ORDER — ACETAMINOPHEN 325 MG PO TABS
650.0000 mg | ORAL_TABLET | Freq: Once | ORAL | Status: AC
Start: 2020-09-10 — End: 2020-09-10
  Administered 2020-09-10: 650 mg via ORAL

## 2020-09-10 NOTE — Patient Instructions (Signed)
[facility]  Discharge Instructions: Thank you for choosing [facility] to provide your oncology and hematology care.   If you have a lab appointment with the [facility], please go directly to the [facility] and check in at the registration area.   Wear comfortable clothing and clothing appropriate for easy access to any Portacath or PICC line.   We strive to give you quality time with your provider. You may need to reschedule your appointment if you arrive late (15 or more minutes).  Arriving late affects you and other patients whose appointments are after yours.  Also, if you miss three or more appointments without notifying the office, you may be dismissed from the clinic at the provider's discretion.      For prescription refill requests, have your pharmacy contact our office and allow 72 hours for refills to be completed.    Today you received the following immunotherapy agents- trastuzumab and pertuzumab    To help prevent nausea and vomiting after your treatment, we encourage you to take your nausea medication as directed.  BELOW ARE SYMPTOMS THAT SHOULD BE REPORTED IMMEDIATELY: . *FEVER GREATER THAN 100.4 F (38 C) OR HIGHER . *CHILLS OR SWEATING . *NAUSEA AND VOMITING THAT IS NOT CONTROLLED WITH YOUR NAUSEA MEDICATION . *UNUSUAL SHORTNESS OF BREATH . *UNUSUAL BRUISING OR BLEEDING . *URINARY PROBLEMS (pain or burning when urinating, or frequent urination) . *BOWEL PROBLEMS (unusual diarrhea, constipation, pain near the anus) . TENDERNESS IN MOUTH AND THROAT WITH OR WITHOUT PRESENCE OF ULCERS (sore throat, sores in mouth, or a toothache) . UNUSUAL RASH, SWELLING OR PAIN  . UNUSUAL VAGINAL DISCHARGE OR ITCHING   Items with * indicate a potential emergency and should be followed up as soon as possible or go to the Emergency Department if any problems should occur.  Please show the CHEMOTHERAPY ALERT CARD or IMMUNOTHERAPY ALERT CARD  at check-in to the Emergency Department and triage nurse.  Should you have questions after your visit or need to cancel or reschedule your appointment, please contact [facility]  Dept: [phone]  and follow the prompts.  Office hours are 8:00 a.m. to 4:30 p.m. Monday - Friday. Please note that voicemails left after 4:00 p.m. may not be returned until the following business day.  We are closed weekends and major holidays. You have access to a nurse at all times for urgent questions. Please call the main number to the clinic Dept: [phone] and follow the prompts.   For any non-urgent questions, you may also contact your provider using MyChart. We now offer e-Visits for anyone 6 and older to request care online for non-urgent symptoms. For details visit [email or web address].   Also download the MyChart app! Go to the app store, search "MyChart", open the app, select [facility], and log in with your MyChart username and password.  Due to Covid, a mask is required upon entering the hospital/clinic. If you do not have a mask, one will be given to you upon arrival. For doctor visits, patients may have 1 support person aged 4 or older with them. For treatment visits, patients cannot have anyone with them due to current Covid guidelines and our immunocompromised population.    Pertuzumab injection What is this medicine? PERTUZUMAB (per TOOZ ue mab) is a monoclonal antibody. It is used to treat breast cancer. This medicine may be used for other purposes; ask your health care provider or pharmacist if you have  questions. COMMON BRAND NAME(S): PERJETA What should I tell my health care provider before I take this medicine? They need to know if you have any of these conditions:  heart disease  heart failure  high blood pressure  history of irregular heart beat  recent or ongoing radiation therapy  an unusual or allergic reaction to pertuzumab, other  medicines, foods, dyes, or preservatives  pregnant or trying to get pregnant  breast-feeding How should I use this medicine? This medicine is for infusion into a vein. It is given by a health care professional in a hospital or clinic setting. Talk to your pediatrician regarding the use of this medicine in children. Special care may be needed. Overdosage: If you think you have taken too much of this medicine contact a poison control center or emergency room at once. NOTE: This medicine is only for you. Do not share this medicine with others. What if I miss a dose? It is important not to miss your dose. Call your doctor or health care professional if you are unable to keep an appointment. What may interact with this medicine? Interactions are not expected. Give your health care provider a list of all the medicines, herbs, non-prescription drugs, or dietary supplements you use. Also tell them if you smoke, drink alcohol, or use illegal drugs. Some items may interact with your medicine. This list may not describe all possible interactions. Give your health care provider a list of all the medicines, herbs, non-prescription drugs, or dietary supplements you use. Also tell them if you smoke, drink alcohol, or use illegal drugs. Some items may interact with your medicine. What should I watch for while using this medicine? Your condition will be monitored carefully while you are receiving this medicine. Report any side effects. Continue your course of treatment even though you feel ill unless your doctor tells you to stop. Do not become pregnant while taking this medicine or for 7 months after stopping it. Women should inform their doctor if they wish to become pregnant or think they might be pregnant. Women of child-bearing potential will need to have a negative pregnancy test before starting this medicine. There is a potential for serious side effects to an unborn child. Talk to your health care  professional or pharmacist for more information. Do not breast-feed an infant while taking this medicine or for 7 months after stopping it. Women must use effective birth control with this medicine. Call your doctor or health care professional for advice if you get a fever, chills or sore throat, or other symptoms of a cold or flu. Do not treat yourself. Try to avoid being around people who are sick. You may experience fever, chills, and headache during the infusion. Report any side effects during the infusion to your health care professional. What side effects may I notice from receiving this medicine? Side effects that you should report to your doctor or health care professional as soon as possible:  breathing problems  chest pain or palpitations  dizziness  feeling faint or lightheaded  fever or chills  skin rash, itching or hives  sore throat  swelling of the face, lips, or tongue  swelling of the legs or ankles  unusually weak or tired Side effects that usually do not require medical attention (report to your doctor or health care professional if they continue or are bothersome):  diarrhea  hair loss  nausea, vomiting  tiredness This list may not describe all possible side effects. Call your doctor for  medical advice about side effects. You may report side effects to FDA at 1-800-FDA-1088. Where should I keep my medicine? This drug is given in a hospital or clinic and will not be stored at home. NOTE: This sheet is a summary. It may not cover all possible information. If you have questions about this medicine, talk to your doctor, pharmacist, or health care provider.  2021 Elsevier/Gold Standard (2015-06-06 12:08:50) Trastuzumab injection for infusion What is this medicine? TRASTUZUMAB (tras TOO zoo mab) is a monoclonal antibody. It is used to treat breast cancer and stomach cancer. This medicine may be used for other purposes; ask your health care provider or  pharmacist if you have questions. COMMON BRAND NAME(S): Herceptin, Galvin Proffer, Trazimera What should I tell my health care provider before I take this medicine? They need to know if you have any of these conditions:  heart disease  heart failure  lung or breathing disease, like asthma  an unusual or allergic reaction to trastuzumab, benzyl alcohol, or other medications, foods, dyes, or preservatives  pregnant or trying to get pregnant  breast-feeding How should I use this medicine? This drug is given as an infusion into a vein. It is administered in a hospital or clinic by a specially trained health care professional. Talk to your pediatrician regarding the use of this medicine in children. This medicine is not approved for use in children. Overdosage: If you think you have taken too much of this medicine contact a poison control center or emergency room at once. NOTE: This medicine is only for you. Do not share this medicine with others. What if I miss a dose? It is important not to miss a dose. Call your doctor or health care professional if you are unable to keep an appointment. What may interact with this medicine? This medicine may interact with the following medications:  certain types of chemotherapy, such as daunorubicin, doxorubicin, epirubicin, and idarubicin This list may not describe all possible interactions. Give your health care provider a list of all the medicines, herbs, non-prescription drugs, or dietary supplements you use. Also tell them if you smoke, drink alcohol, or use illegal drugs. Some items may interact with your medicine. What should I watch for while using this medicine? Visit your doctor for checks on your progress. Report any side effects. Continue your course of treatment even though you feel ill unless your doctor tells you to stop. Call your doctor or health care professional for advice if you get a fever, chills or sore  throat, or other symptoms of a cold or flu. Do not treat yourself. Try to avoid being around people who are sick. You may experience fever, chills and shaking during your first infusion. These effects are usually mild and can be treated with other medicines. Report any side effects during the infusion to your health care professional. Fever and chills usually do not happen with later infusions. Do not become pregnant while taking this medicine or for 7 months after stopping it. Women should inform their doctor if they wish to become pregnant or think they might be pregnant. Women of child-bearing potential will need to have a negative pregnancy test before starting this medicine. There is a potential for serious side effects to an unborn child. Talk to your health care professional or pharmacist for more information. Do not breast-feed an infant while taking this medicine or for 7 months after stopping it. Women must use effective birth control with this medicine. What side  effects may I notice from receiving this medicine? Side effects that you should report to your doctor or health care professional as soon as possible:  allergic reactions like skin rash, itching or hives, swelling of the face, lips, or tongue  chest pain or palpitations  cough  dizziness  feeling faint or lightheaded, falls  fever  general ill feeling or flu-like symptoms  signs of worsening heart failure like breathing problems; swelling in your legs and feet  unusually weak or tired Side effects that usually do not require medical attention (report to your doctor or health care professional if they continue or are bothersome):  bone pain  changes in taste  diarrhea  joint pain  nausea/vomiting  weight loss This list may not describe all possible side effects. Call your doctor for medical advice about side effects. You may report side effects to FDA at 1-800-FDA-1088. Where should I keep my medicine? This  drug is given in a hospital or clinic and will not be stored at home. NOTE: This sheet is a summary. It may not cover all possible information. If you have questions about this medicine, talk to your doctor, pharmacist, or health care provider.  2021 Elsevier/Gold Standard (2016-04-28 14:37:52)

## 2020-09-10 NOTE — Progress Notes (Signed)
Ok to proceed with treatment with echo from 04/09/20 per Nicholas Lose, MD

## 2020-09-11 NOTE — Progress Notes (Signed)
Location of Breast Cancer:right breast  Histology per Pathology Report: 08/20/2020    04/23/2020    Receptor Status: 08/20/2020   Did patient present with symptoms (if so, please note symptoms) or was this found on screening mammography?:  04/03/2020 Patient palpated a right breast mass for 2 weeks. Diagnostic mammogram and [place] on 03/14/20 showed a 2.0cm mass at the 10 o'clock position in the right breast and two enlarged right axillary lymph nodes measuring 2.0cm and 1.7cm.   Past/Anticipated interventions by surgeon, if any: 08/20/2020 by Dr [name]   Past/Anticipated interventions by medical oncology, if any:    Lymphedema issues, if any:  no    Pain issues, if any:  no   SAFETY ISSUES:  Prior radiation? no  Pacemaker/ICD? no  Possible current pregnancy?no  Is the patient on methotrexate? no  Current Complaints / other details:  Patient states she is not a former smoker as has been listed elsewhere in her chart.   Vitals:   09/18/20 0838  BP: (!) 133/94  Pulse: 77  Resp: 18  Temp: 98.2 F (36.8 C)  TempSrc: Oral  SpO2: 99%  Weight: 193 lb 4 oz (87.7 kg)  Height: 5\' 9"  (1.753 m)

## 2020-09-17 ENCOUNTER — Other Ambulatory Visit: Payer: Self-pay

## 2020-09-17 ENCOUNTER — Ambulatory Visit (HOSPITAL_COMMUNITY)
Admission: RE | Admit: 2020-09-17 | Discharge: 2020-09-17 | Disposition: A | Discharge status: Home / Self Care | Source: Ambulatory Visit | Attending: Hematology and Oncology | Admitting: Hematology and Oncology

## 2020-09-17 DIAGNOSIS — C50411 Malignant neoplasm of upper-outer quadrant of right female breast: Secondary | ICD-10-CM | POA: Insufficient documentation

## 2020-09-17 DIAGNOSIS — Z171 Estrogen receptor negative status [ER-]: Secondary | ICD-10-CM | POA: Diagnosis not present

## 2020-09-17 DIAGNOSIS — Z01818 Encounter for other preprocedural examination: Secondary | ICD-10-CM | POA: Diagnosis present

## 2020-09-17 DIAGNOSIS — E079 Disorder of thyroid, unspecified: Secondary | ICD-10-CM | POA: Insufficient documentation

## 2020-09-17 DIAGNOSIS — D649 Anemia, unspecified: Secondary | ICD-10-CM | POA: Insufficient documentation

## 2020-09-17 DIAGNOSIS — I313 Pericardial effusion (noninflammatory): Secondary | ICD-10-CM | POA: Insufficient documentation

## 2020-09-17 DIAGNOSIS — Z0189 Encounter for other specified special examinations: Secondary | ICD-10-CM

## 2020-09-17 DIAGNOSIS — I10 Essential (primary) hypertension: Secondary | ICD-10-CM | POA: Insufficient documentation

## 2020-09-17 LAB — ECHOCARDIOGRAM COMPLETE
Area-P 1/2: 2.87 cm2
S' Lateral: 2.4 cm

## 2020-09-17 NOTE — Progress Notes (Signed)
  Echocardiogram 2D Echocardiogram has been performed.  Shannon Kane G Shannon Kane 09/17/2020, 10:05 AM

## 2020-09-18 ENCOUNTER — Encounter: Payer: Self-pay | Admitting: Radiation Oncology

## 2020-09-18 ENCOUNTER — Ambulatory Visit
Admission: RE | Admit: 2020-09-18 | Discharge: 2020-09-18 | Disposition: A | Discharge status: Home / Self Care | Source: Ambulatory Visit | Attending: Radiation Oncology | Admitting: Radiation Oncology

## 2020-09-18 VITALS — BP 133/94 | HR 77 | Temp 98.2°F | Resp 18 | Ht 69.0 in | Wt 193.2 lb

## 2020-09-18 DIAGNOSIS — C50411 Malignant neoplasm of upper-outer quadrant of right female breast: Secondary | ICD-10-CM

## 2020-09-18 DIAGNOSIS — Z9221 Personal history of antineoplastic chemotherapy: Secondary | ICD-10-CM | POA: Diagnosis not present

## 2020-09-18 DIAGNOSIS — I10 Essential (primary) hypertension: Secondary | ICD-10-CM | POA: Insufficient documentation

## 2020-09-18 DIAGNOSIS — Z510 Encounter for antineoplastic radiation therapy: Secondary | ICD-10-CM | POA: Diagnosis present

## 2020-09-18 DIAGNOSIS — C773 Secondary and unspecified malignant neoplasm of axilla and upper limb lymph nodes: Secondary | ICD-10-CM | POA: Insufficient documentation

## 2020-09-18 DIAGNOSIS — C50911 Malignant neoplasm of unspecified site of right female breast: Secondary | ICD-10-CM

## 2020-09-18 DIAGNOSIS — I313 Pericardial effusion (noninflammatory): Secondary | ICD-10-CM | POA: Insufficient documentation

## 2020-09-18 DIAGNOSIS — E079 Disorder of thyroid, unspecified: Secondary | ICD-10-CM | POA: Insufficient documentation

## 2020-09-18 DIAGNOSIS — Z79899 Other long term (current) drug therapy: Secondary | ICD-10-CM | POA: Insufficient documentation

## 2020-09-18 DIAGNOSIS — Z171 Estrogen receptor negative status [ER-]: Secondary | ICD-10-CM | POA: Insufficient documentation

## 2020-09-18 DIAGNOSIS — D649 Anemia, unspecified: Secondary | ICD-10-CM | POA: Insufficient documentation

## 2020-09-18 NOTE — Progress Notes (Signed)
See MD note for nursing evaluation. °

## 2020-09-18 NOTE — Progress Notes (Addendum)
Radiation Oncology         [phone] ________________________________  Name: Shannon Kane MRN: [id number]  Date: 09/18/2020  DOB: Dec 28, 1970  Re-Evaluation Note  CC: [name], MD  Nicholas Lose, MD    ICD-10-CM   1. Malignant neoplasm of upper-outer quadrant of right breast in female, estrogen receptor negative ([place])  C50.411    Z17.1     Diagnosis:  Stage T1c, N1, Right Breast UOQ, Invasive Ductal Carcinoma with high-grade DCIS, ER- / PR- / Her2+, Grade 2  Narrative: The patient returns today to discuss radiation treatment options. She was seen in the multidisciplinary breast clinic on 04/03/2020, at which time it was recommended that she proceed with genetic testing, MRI, neoadjuvant chemotherapy, right lumpectomy with TAD, and adjuvant radiation therapy.  Since consultation, she underwent genetic testing on 04/03/2020. Results were negative.  MRI of bilateral breasts on 04/15/2020 showed the biopsy-proven invasive ductal carcinoma involving the upper outer quadrant of the right breast at posterior depth with a maximum MRI measurement of 2.1 cm. There was also noted to be a satellite mass immediately adjacent (anterior) to the biopsied dominant mass with a maximum measurement of 1.4 cm. Additionally, there were two pathologic right axillary lymph nodes, the largest of which was immediately adjacent (posterosuperior) to the biopsied mass. The second node, which was previously biopsied and proven to be metastatic, was posterior to in contiguous with the largest node. There was no MRI evidence of malignancy involving the left breast.   The patient underwent neoadjuvant chemotherapy with TCH Perjeta x6 cycles beginning on 04/16/2020 under the care of Dr. Lindi Adie. This was followed by one year of Herceptin Perjeta maintenance.  Additional biopsy of the right breast on 04/22/2020 revealed high-grade DCIS with necrosis. Estrogen and progesterone receptor negative.  MRI of bilateral  breasts on 07/31/2020 showed a complete imaging response to neoadjuvant chemotherapy.  The patient underwent a right breast lumpectomy with targeted right axillary lymph node dissection on 08/20/2020 under the care of Dr. [name]. Pathology from the procedure revealed no residual invasive or in-situ carcinoma status post neoadjuvant treatment. One lymph node was negative for carcinoma. Six additional right axillary lymph nodes were biopsied, all of which were negative for carcinoma. Additional medial margin of the right breast was also negative for carcinoma.  On review of systems, the patient reports feeling well. She denies any discomfort along the right breast or axillary region.  She denies any swelling in her right arm or hand.      Allergies:  is allergic to amoxicillin, ciprofloxacin, and penicillins.  Meds: Current Outpatient Medications  Medication Sig Dispense Refill  . Cholecalciferol (VITAMIN D-3 PO) Take 6,000 Units by mouth daily.     Marland Kitchen lidocaine-prilocaine (EMLA) cream Apply 1 application topically as needed.    Marland Kitchen lisinopril (ZESTRIL) 20 MG tablet TAKE 1 TABLET DAILY (Patient taking differently: Take 10 mg by mouth daily. Takes at night) 90 tablet 3  . MAGNESIUM CITRATE PO Take 500 mcg by mouth at bedtime as needed (sleep). Only when having trouble sleeping    . potassium chloride (KLOR-CON) 10 MEQ tablet TAKE 1 TABLET BY MOUTH EVERY DAY 30 tablet 1  . SYNTHROID 75 MCG tablet TAKE 1 TABLET ON MONDAY THROUGH SUNDAY (Patient taking differently: Take 75 mcg by mouth daily before breakfast.) 90 tablet 3  . vitamin B-12 (CYANOCOBALAMIN) 1000 MCG tablet Take 1,000 mcg by mouth daily.    . clotrimazole-betamethasone (LOTRISONE) cream clotrimazole-betamethasone 1 %-0.05 % topical cream  APPLY TO  AFFECTED AREA TWICE A DAY MORNING AND EVENING FOR 2 WEEKS (Patient not taking: No sig reported)    . diphenoxylate-atropine (LOMOTIL) 2.5-0.025 MG tablet Take 1 tablet by mouth 4 (four) times  daily as needed for diarrhea or loose stools. (Patient not taking: Reported on 09/18/2020) 30 tablet 2  . pantoprazole (PROTONIX) 20 MG tablet Take 1 tablet (20 mg total) by mouth daily. (Patient not taking: Reported on 09/18/2020) 30 tablet 2   No current facility-administered medications for this encounter.    Physical Findings: The patient is in no acute distress. Patient is alert and oriented.  Accompanied by her husband on evaluation today  height is _0  (1.753 m) and weight is 193 lb 4 oz (87.7 kg). Her oral temperature is 98.2 F (36.8 C). Her blood pressure is 133/94 (abnormal) and her pulse is 77. Her respiration is 18 and oxygen saturation is 99%.   Lungs are clear to auscultation bilaterally. Heart has regular rate and rhythm. No palpable cervical, supraclavicular, or axillary adenopathy. Abdomen soft, non-tender, normal bowel sounds. Right breast: Well-healing scar in the  upper outer quadrant which encompasses both the lumpectomy site and the targeted axillary dissection site.  She has a faint scar in the periareolar area from her prior benign biopsy. no signs of drainage or infection in the breast. This area has healed well.  There is some induration noted inferior to the scar.  No significant seroma palpated within the breast or axillary region.  Lab Findings: Lab Results  Component Value Date   WBC 4.1 09/10/2020   HGB 12.6 09/10/2020   HCT 38.7 09/10/2020   MCV 101.8 (H) 09/10/2020   PLT 213 09/10/2020    Radiographic Findings: MM Breast Surgical Specimen  Result Date: 08/20/2020 CLINICAL DATA:  Evaluate surgical specimen following RIGHT lumpectomy and targeted RIGHT axillary node dissection for breast cancer and metastatic lymph node. EXAM: SPECIMEN RADIOGRAPH OF THE RIGHT BREAST COMPARISON:  Previous exam(s). FINDINGS: Status post excision of the RIGHT breast. Two separate radioactive seeds and the COIL, BARBELL, and RIBBON biopsy marker clips are present, completely intact,  and were marked for pathology. IMPRESSION: Specimen radiograph of the RIGHT breast. Electronically Signed   By: Margarette Canada M.D.   On: 08/20/2020 11:59   ECHOCARDIOGRAM COMPLETE  Result Date: 09/17/2020    ECHOCARDIOGRAM REPORT   Patient Name:   Shannon Kane Date of Exam: 09/17/2020 Medical Rec #:  [id number]   Height:       69.0 in Accession #:    [id number]  Weight:       195.7 lb Date of Birth:  09/22/1970   BSA:          2.047 m Patient Age:    61 years    BP:           142/89 mmHg Patient Gender: F           HR:           80 bpm. Exam Location:  Outpatient Procedure: 2D Echo, 3D Echo, Cardiac Doppler, Color Doppler and Strain Analysis Indications:    Z51.11 Encounter for antineoplastic chemotheraphy  History:        Patient has prior history of Echocardiogram examinations, most                 recent 04/09/2020. Risk Factors:Hypertension. Cancer. Thyroid                 Disease. Palpitation. Anemia.  Sonographer:  Tiffany Dance Referring Phys: [id number] Nicholas Lose IMPRESSIONS  1. Normal GLS -17.2. Left ventricular ejection fraction, by estimation, is 60 to 65%. The left ventricle has normal function. The left ventricle has no regional wall motion abnormalities. There is mild left ventricular hypertrophy. Left ventricular diastolic parameters were normal.  2. Right ventricular systolic function is normal. The right ventricular size is normal.  3. Left atrial size was moderately dilated.  4. The pericardial effusion is posterior to the left ventricle.  5. The mitral valve is abnormal. Trivial mitral valve regurgitation. No evidence of mitral stenosis.  6. The aortic valve is tricuspid. Aortic valve regurgitation is not visualized. No aortic stenosis is present.  7. The inferior vena cava is normal in size with greater than 50% respiratory variability, suggesting right atrial pressure of 3 mmHg. FINDINGS  Left Ventricle: Normal GLS -17.2. Left ventricular ejection fraction, by estimation, is 60 to 65%. The left  ventricle has normal function. The left ventricle has no regional wall motion abnormalities. The left ventricular internal cavity size was normal  in size. There is mild left ventricular hypertrophy. Left ventricular diastolic parameters were normal. Right Ventricle: The right ventricular size is normal. No increase in right ventricular wall thickness. Right ventricular systolic function is normal. Left Atrium: Left atrial size was moderately dilated. Right Atrium: Right atrial size was normal in size. Pericardium: Trivial pericardial effusion is present. The pericardial effusion is posterior to the left ventricle. Mitral Valve: The mitral valve is abnormal. There is mild thickening of the mitral valve leaflet(s). There is mild calcification of the mitral valve leaflet(s). Mild mitral annular calcification. Trivial mitral valve regurgitation. No evidence of mitral valve stenosis. Tricuspid Valve: The tricuspid valve is normal in structure. Tricuspid valve regurgitation is trivial. No evidence of tricuspid stenosis. Aortic Valve: The aortic valve is tricuspid. Aortic valve regurgitation is not visualized. No aortic stenosis is present. Pulmonic Valve: The pulmonic valve was normal in structure. Pulmonic valve regurgitation is trivial. No evidence of pulmonic stenosis. Aorta: The aortic root is normal in size and structure. Venous: The inferior vena cava is normal in size with greater than 50% respiratory variability, suggesting right atrial pressure of 3 mmHg. IAS/Shunts: No atrial level shunt detected by color flow Doppler.  LEFT VENTRICLE PLAX 2D LVIDd:         4.20 cm Diastology LVIDs:         2.40 cm LV e' medial:    5.66 cm/s LV PW:         1.30 cm LV E/e' medial:  11.1 LV IVS:        1.20 cm LV e' lateral:   8.59 cm/s                        LV E/e' lateral: 7.3                         3D Volume EF:                        3D EF:        65 %                        LV EDV:       127 ml  LV  ESV:       45 ml                        LV SV:        82 ml RIGHT VENTRICLE RV Basal diam:  2.60 cm RV S prime:     12.30 cm/s TAPSE (M-mode): 1.9 cm LEFT ATRIUM             Index       RIGHT ATRIUM           Index LA diam:        4.50 cm 2.20 cm/m  RA Area:     13.20 cm LA Vol (A2C):   36.3 ml 17.73 ml/m RA Volume:   26.70 ml  13.04 ml/m LA Vol (A4C):   29.0 ml 14.17 ml/m LA Biplane Vol: 32.8 ml 16.02 ml/m  AORTIC VALVE LVOT Vmax:   73.90 cm/s LVOT Vmean:  49.000 cm/s LVOT VTI:    0.163 m  AORTA Ao Asc diam: 2.60 cm MITRAL VALVE MV Area (PHT): 2.87 cm    SHUNTS MV Decel Time: 264 msec    Systemic VTI: 0.16 m MV E velocity: 63.10 cm/s MV A velocity: 62.60 cm/s MV E/A ratio:  1.01 Jenkins Rouge MD Electronically signed by Jenkins Rouge MD Signature Date/Time: 09/17/2020/10:31:21 AM    Final    MM RT RADIOACTIVE SEED LOC MAMMO GUIDE  Result Date: 08/19/2020 CLINICAL DATA:  50 year old with biopsy-proven multifocal invasive ductal carcinoma and high-grade DCIS involving the UPPER OUTER QUADRANT the RIGHT breast (ER negative, PR negative, HER 2 Neu positive) and a biopsy-proven metastatic RIGHT axillary lymph node. The patient underwent neoadjuvant chemotherapy with an excellent imaging response to therapy. She presents now for radioactive seed localization of the breast and the axillary lymph node prior to lumpectomy and targeted axillary node excision. Due to the close proximity of the dumbbell-shaped tissue marker clip and the ribbon shaped tissue marker clip at the biopsy sites in the [place], a single seed will be placed at this location. The second seed will be placed within the biopsy-proven metastatic axillary lymph node. The previously identified satellite mass in the axillary tail of the breast was directly between the biopsy-proven malignancy and the lymph node, so the seeds will bracket the area to be excised. (This was discussed by telephone with Dr. [name] earlier today 08/19/2020).  EXAM: # 1) ULTRASOUND GUIDED RADIOACTIVE SEED LOCALIZATION OF THE RIGHT AXILLA # 2) MAMMOGRAPHIC GUIDED RADIOACTIVE SEED LOCALIZATION THE RIGHT BREAST # 3) 2D AND TOMOSYNTHESIS DIAGNOSTIC RIGHT MAMMOGRAM (TO CONFIRM ULTRASOUND SEED PLACEMENT) COMPARISON:  Previous exam(s). FINDINGS: Patient presents for radioactive seed localization prior to RIGHT breast lumpectomy and RIGHT axillary node excision. I met with the patient and we discussed the procedure of seed localization including benefits and alternatives. We discussed the high likelihood of a successful procedure. We discussed the risks of the procedure including infection, bleeding, tissue injury and further surgery. We discussed the low dose of radioactivity involved in the procedure. Informed, written consent was given. The usual time-out protocol was performed immediately prior to the procedure. Initially, using mammographic guidance, sterile technique with chlorhexidine as skin antisepsis, 1% lidocaine as local anesthetic, an I-125 radioactive seed was used to localize the dumbbell-shaped and ribbon shaped clips in the [place] at posterior depth using a lateral approach. Subsequently, using ultrasound guidance, sterile technique with chlorhexidine as skin antisepsis, 1% lidocaine as local anesthetic,  an I-125 radioactive seed was used to localize the RIGHT axillary lymph node and the associated coil shaped tissue marker clip using an inferolateral approach. Because the lymph node has a normal sonographic appearance currently, ultrasound landmarks on the original ultrasound 03/14/2020 reused to identify the node and a BB was placed on the skin. A spot tangential mammographic image was obtained, confirming the identified node was in fact the correct node associated with coil clip. The follow-up mammogram images confirm that the seeds are appropriately positioned within 5 mm of the dumbbell and ribbon clips in the [place] and  immediately adjacent the RIGHT axillary lymph node associated with the coil clip. The seeds are approximately 4.3 cm apart. The images are marked for Dr. [name]. Follow-up survey of the patient confirms the presence of the radioactive seeds. # 1) RIGHT breast: Order number of I-125 seed: [id number] Total activity: 0.253 mCi Reference Date: 08/02/2020 # 2) RIGHT axilla: Order number of I-125 seed: [id number] Total activity: 0.253 mCi Reference Date: 08/02/2020 The patient tolerated the procedure well and was released from the [facility]. She was given instructions regarding seed removal. IMPRESSION: 1. Radioactive seed localization of the dumbbell shaped and ribbon shaped tissue marking clips associated with the biopsy-proven malignancy in the [place] of the RIGHT breast. 2. Radioactive seed localization of the previously biopsied RIGHT axillary lymph node. 3. The seeds are approximately 4.3 cm apart. 4. No apparent complications. Electronically Signed   By: Evangeline Dakin M.D.   On: 08/19/2020 16:12   [place] RT RADIOACTIVE SEED LOC  Result Date: 08/19/2020 CLINICAL DATA:  50 year old with biopsy-proven multifocal invasive ductal carcinoma and high-grade DCIS involving the UPPER OUTER QUADRANT the RIGHT breast (ER negative, PR negative, HER 2 Neu positive) and a biopsy-proven metastatic RIGHT axillary lymph node. The patient underwent neoadjuvant chemotherapy with an excellent imaging response to therapy. She presents now for radioactive seed localization of the breast and the axillary lymph node prior to lumpectomy and targeted axillary node excision. Due to the close proximity of the dumbbell-shaped tissue marker clip and the ribbon shaped tissue marker clip at the biopsy sites in the [place], a single seed will be placed at this location. The second seed will be placed within the biopsy-proven metastatic axillary lymph node. The previously identified satellite mass in the axillary  tail of the breast was directly between the biopsy-proven malignancy and the lymph node, so the seeds will bracket the area to be excised. (This was discussed by telephone with Dr. [name] earlier today 08/19/2020). EXAM: # 1) ULTRASOUND GUIDED RADIOACTIVE SEED LOCALIZATION OF THE RIGHT AXILLA # 2) MAMMOGRAPHIC GUIDED RADIOACTIVE SEED LOCALIZATION THE RIGHT BREAST # 3) 2D AND TOMOSYNTHESIS DIAGNOSTIC RIGHT MAMMOGRAM (TO CONFIRM ULTRASOUND SEED PLACEMENT) COMPARISON:  Previous exam(s). FINDINGS: Patient presents for radioactive seed localization prior to RIGHT breast lumpectomy and RIGHT axillary node excision. I met with the patient and we discussed the procedure of seed localization including benefits and alternatives. We discussed the high likelihood of a successful procedure. We discussed the risks of the procedure including infection, bleeding, tissue injury and further surgery. We discussed the low dose of radioactivity involved in the procedure. Informed, written consent was given. The usual time-out protocol was performed immediately prior to the procedure. Initially, using mammographic guidance, sterile technique with chlorhexidine as skin antisepsis, 1% lidocaine as local anesthetic, an I-125 radioactive seed was used to localize the dumbbell-shaped and ribbon shaped clips in the [place] at posterior depth  using a lateral approach. Subsequently, using ultrasound guidance, sterile technique with chlorhexidine as skin antisepsis, 1% lidocaine as local anesthetic, an I-125 radioactive seed was used to localize the RIGHT axillary lymph node and the associated coil shaped tissue marker clip using an inferolateral approach. Because the lymph node has a normal sonographic appearance currently, ultrasound landmarks on the original ultrasound 03/14/2020 reused to identify the node and a BB was placed on the skin. A spot tangential mammographic image was obtained, confirming the identified node was in  fact the correct node associated with coil clip. The follow-up mammogram images confirm that the seeds are appropriately positioned within 5 mm of the dumbbell and ribbon clips in the [place] and immediately adjacent the RIGHT axillary lymph node associated with the coil clip. The seeds are approximately 4.3 cm apart. The images are marked for Dr. [name]. Follow-up survey of the patient confirms the presence of the radioactive seeds. # 1) RIGHT breast: Order number of I-125 seed: [id number] Total activity: 0.253 mCi Reference Date: 08/02/2020 # 2) RIGHT axilla: Order number of I-125 seed: [id number] Total activity: 0.253 mCi Reference Date: 08/02/2020 The patient tolerated the procedure well and was released from the [place]. She was given instructions regarding seed removal. IMPRESSION: 1. Radioactive seed localization of the dumbbell shaped and ribbon shaped tissue marking clips associated with the biopsy-proven malignancy in the [place] of the RIGHT breast. 2. Radioactive seed localization of the previously biopsied RIGHT axillary lymph node. 3. The seeds are approximately 4.3 cm apart. 4. No apparent complications. Electronically Signed   By: Evangeline Dakin M.D.   On: 08/19/2020 16:12   MM CLIP PLACEMENT RIGHT  Result Date: 08/19/2020 CLINICAL DATA:  50 year old with biopsy-proven multifocal invasive ductal carcinoma and high-grade DCIS involving the UPPER OUTER QUADRANT the RIGHT breast (ER negative, PR negative, HER 2 Neu positive) and a biopsy-proven metastatic RIGHT axillary lymph node. The patient underwent neoadjuvant chemotherapy with an excellent imaging response to therapy. She presents now for radioactive seed localization of the breast and the axillary lymph node prior to lumpectomy and targeted axillary node excision. Due to the close proximity of the dumbbell-shaped tissue marker clip and the ribbon shaped tissue marker clip at the biopsy sites in the [place], a single seed will be placed at this location. The second seed will be placed within the biopsy-proven metastatic axillary lymph node. The previously identified satellite mass in the axillary tail of the breast was directly between the biopsy-proven malignancy and the lymph node, so the seeds will bracket the area to be excised. (This was discussed by telephone with Dr. [name] earlier today 08/19/2020). EXAM: # 1) ULTRASOUND GUIDED RADIOACTIVE SEED LOCALIZATION OF THE RIGHT AXILLA # 2) MAMMOGRAPHIC GUIDED RADIOACTIVE SEED LOCALIZATION THE RIGHT BREAST # 3) 2D AND TOMOSYNTHESIS DIAGNOSTIC RIGHT MAMMOGRAM (TO CONFIRM ULTRASOUND SEED PLACEMENT) COMPARISON:  Previous exam(s). FINDINGS: Patient presents for radioactive seed localization prior to RIGHT breast lumpectomy and RIGHT axillary node excision. I met with the patient and we discussed the procedure of seed localization including benefits and alternatives. We discussed the high likelihood of a successful procedure. We discussed the risks of the procedure including infection, bleeding, tissue injury and further surgery. We discussed the low dose of radioactivity involved in the procedure. Informed, written consent was given. The usual time-out protocol was performed immediately prior to the procedure. Initially, using mammographic guidance, sterile technique with chlorhexidine as skin antisepsis, 1% lidocaine as local anesthetic, an I-125 radioactive  seed was used to localize the dumbbell-shaped and ribbon shaped clips in the [place] at posterior depth using a lateral approach. Subsequently, using ultrasound guidance, sterile technique with chlorhexidine as skin antisepsis, 1% lidocaine as local anesthetic, an I-125 radioactive seed was used to localize the RIGHT axillary lymph node and the associated coil shaped tissue marker clip using an inferolateral approach. Because the lymph node has a normal sonographic appearance currently,  ultrasound landmarks on the original ultrasound 03/14/2020 reused to identify the node and a BB was placed on the skin. A spot tangential mammographic image was obtained, confirming the identified node was in fact the correct node associated with coil clip. The follow-up mammogram images confirm that the seeds are appropriately positioned within 5 mm of the dumbbell and ribbon clips in the [place] and immediately adjacent the RIGHT axillary lymph node associated with the coil clip. The seeds are approximately 4.3 cm apart. The images are marked for Dr. [name]. Follow-up survey of the patient confirms the presence of the radioactive seeds. # 1) RIGHT breast: Order number of I-125 seed: [id number] Total activity: 0.253 mCi Reference Date: 08/02/2020 # 2) RIGHT axilla: Order number of I-125 seed: [id number] Total activity: 0.253 mCi Reference Date: 08/02/2020 The patient tolerated the procedure well and was released from the [place]. She was given instructions regarding seed removal. IMPRESSION: 1. Radioactive seed localization of the dumbbell shaped and ribbon shaped tissue marking clips associated with the biopsy-proven malignancy in the [place] of the RIGHT breast. 2. Radioactive seed localization of the previously biopsied RIGHT axillary lymph node. 3. The seeds are approximately 4.3 cm apart. 4. No apparent complications. Electronically Signed   By: Evangeline Dakin M.D.   On: 08/19/2020 16:12    Impression:  Stage T1c, N1, Right Breast UOQ, Invasive Ductal Carcinoma with high-grade DCIS, ER- / PR- / Her2+, Grade 2  The patient has had an excellent response to her neoadjuvant chemotherapy with complete response noted within the breast and axillary region.  Patient would be a good candidate for adjuvant radiation therapy directed to the right breast.  Would also recommend elective coverage of the axillary area given her node positivity prior to neoadjuvant chemotherapy.  I  discussed the overall treatment course side effects and potential toxicities of radiation therapy in this situation with patient and her husband.  She appears to understand and wishes to proceed with planned course of treatment.  Plan:  Patient is scheduled for CT simulation later today.  Treatments began approximately a week later.  Anticipate 6-1/2 weeks of radiation therapy.  Total time spent in this encounter was 35 minutes which included reviewing the patient's most recent genetic testing, breast MRIs, biopsy, surgery, pathology reports, neoadjuvant chemotherapy, follow-ups, physical examination, and documentation.  -----------------------------------  Blair Promise, PhD, MD  This document serves as a record of services personally performed by [name], MD. It was created on his behalf by Clerance Lav, a trained medical scribe. The creation of this record is based on the scribe's personal observations and the provider's statements to them. This document has been checked and approved by the attending provider.

## 2020-09-23 DIAGNOSIS — Z510 Encounter for antineoplastic radiation therapy: Secondary | ICD-10-CM | POA: Diagnosis not present

## 2020-09-23 NOTE — Addendum Note (Signed)
Encounter addended by: [name], MD on: 09/23/2020 10:32 AM  Actions taken: Clinical Note Signed

## 2020-09-24 ENCOUNTER — Encounter: Payer: Self-pay | Admitting: *Deleted

## 2020-09-25 ENCOUNTER — Ambulatory Visit
Admission: RE | Admit: 2020-09-25 | Discharge: 2020-09-25 | Disposition: A | Discharge status: Home / Self Care | Source: Ambulatory Visit | Attending: Radiation Oncology | Admitting: Radiation Oncology

## 2020-09-25 ENCOUNTER — Other Ambulatory Visit: Payer: Self-pay

## 2020-09-25 DIAGNOSIS — Z510 Encounter for antineoplastic radiation therapy: Secondary | ICD-10-CM | POA: Diagnosis not present

## 2020-09-25 DIAGNOSIS — Z171 Estrogen receptor negative status [ER-]: Secondary | ICD-10-CM

## 2020-09-25 DIAGNOSIS — C50411 Malignant neoplasm of upper-outer quadrant of right female breast: Secondary | ICD-10-CM

## 2020-09-26 ENCOUNTER — Ambulatory Visit
Admission: RE | Admit: 2020-09-26 | Discharge: 2020-09-26 | Disposition: A | Discharge status: Home / Self Care | Source: Ambulatory Visit | Attending: Radiation Oncology | Admitting: Radiation Oncology

## 2020-09-26 DIAGNOSIS — Z510 Encounter for antineoplastic radiation therapy: Secondary | ICD-10-CM | POA: Diagnosis not present

## 2020-09-27 ENCOUNTER — Other Ambulatory Visit: Payer: Self-pay

## 2020-09-27 ENCOUNTER — Ambulatory Visit
Admission: RE | Admit: 2020-09-27 | Discharge: 2020-09-27 | Disposition: A | Discharge status: Home / Self Care | Source: Ambulatory Visit | Attending: Radiation Oncology | Admitting: Radiation Oncology

## 2020-09-27 DIAGNOSIS — Z510 Encounter for antineoplastic radiation therapy: Secondary | ICD-10-CM | POA: Diagnosis not present

## 2020-09-30 ENCOUNTER — Other Ambulatory Visit: Payer: Self-pay

## 2020-09-30 ENCOUNTER — Ambulatory Visit
Admission: RE | Admit: 2020-09-30 | Discharge: 2020-09-30 | Disposition: A | Discharge status: Home / Self Care | Source: Ambulatory Visit | Attending: Radiation Oncology | Admitting: Radiation Oncology

## 2020-09-30 DIAGNOSIS — Z171 Estrogen receptor negative status [ER-]: Secondary | ICD-10-CM

## 2020-09-30 DIAGNOSIS — Z510 Encounter for antineoplastic radiation therapy: Secondary | ICD-10-CM | POA: Diagnosis not present

## 2020-09-30 DIAGNOSIS — C50411 Malignant neoplasm of upper-outer quadrant of right female breast: Secondary | ICD-10-CM

## 2020-09-30 NOTE — Progress Notes (Signed)
Patient Care Team: [name], MD as PCP - General (Internal Medicine) Rockwell Germany, RN as Oncology Nurse Navigator Mauro Kaufmann, RN as Oncology Nurse Navigator Coralie Keens, MD as Consulting Physician (General Surgery) Nicholas Lose, MD as Consulting Physician (Hematology and Oncology) [name], MD as Consulting Physician (Radiation Oncology)  DIAGNOSIS:    ICD-10-CM   1. Malignant neoplasm of upper-outer quadrant of right breast in female, estrogen receptor negative ([place])  C50.411    Z17.1     SUMMARY OF ONCOLOGIC HISTORY: Oncology History  Malignant neoplasm of upper-outer quadrant of right breast in female, estrogen receptor negative ([place])  03/28/2020 Initial Diagnosis   Patient palpated a right breast mass x2 wks. Mammogram and US showed a 2.0cm mass at the 10 o'clock position in the right breast and two enlarged right axillary lymph nodes, 2.0cm and 1.7cm. 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   04/16/2020 - 07/30/2020 Chemotherapy   TCHP X 6 followed by HP maintenance: Path CR       04/18/2020 Genetic Testing   Negative genetic testing: no pathogenic variants detected in [facility] Multi-Cancer Panel.  Variants of uncertain significance detected in CDH1 at c.1289T>G (p.Val430Gly) and EGFR at c.2873G>A (p.Arg958His).  The report date is April 18, 2020.   The Multi-Cancer Panel offered by [facility] includes sequencing and/or deletion duplication testing of the following 85 genes: AIP, ALK, APC, ATM, AXIN2,BAP1,  BARD1, BLM, BMPR1A, BRCA1, BRCA2, BRIP1, CASR, CDC73, CDH1, CDK4, CDKN1B, CDKN1C, CDKN2A (p14ARF), CDKN2A (p16INK4a), CEBPA, CHEK2, CTNNA1, DICER1, DIS3L2, EGFR, EPCAM (Deletion/duplication testing only), FH, FLCN, GATA2, GPC3, GREM1 (Promoter region deletion/duplication testing only), HOXB13 (c.251G>A, p.Gly84Glu), HRAS, KIT, MAX, MEN1, MET, MITF (c.952G>A, p.Glu318Lys variant only),  MLH1, MSH2, MSH3, MSH6, MUTYH, NBN, NF1, NF2, NTHL1, PALB2, PDGFRA, PHOX2B, PMS2, POLD1, POLE, POT1, PRKAR1A, PTCH1, PTEN, RAD50, RAD51C, RAD51D, RB1, RECQL4, RET, RNF43, RUNX1, SDHAF2, SDHA (sequence changes only), SDHB, SDHC, SDHD, SMAD4, SMARCA4, SMARCB1, SMARCE1, STK11, SUFU, TERC, TERT, TMEM127, TP53, TSC1, TSC2, VHL, WRN and WT1.    08/20/2020 Surgery   Right lumpectomy [name]): no residual carcinoma, 7 right axillary lymph nodes negative for carcinoma.   09/10/2020 -  Chemotherapy      Patient is on Antibody Plan: BREAST TRASTUZUMAB + PERTUZUMAB Q21D    09/26/2020 -  Radiation Therapy   Adjuvant radiation     CHIEF COMPLIANT: Follow-up on Herceptin and Perjeta  INTERVAL HISTORY: Shannon Kane is a 50 y.o. with above-mentioned history of right breast cancer who completed neoadjuvant chemotherapy, underwent a right lumpectomy, and is currently on radiation.  She is receiving Herceptin Perjeta maintenance.  She started radiation therapy as well.  Her major complaints are related to loss of nails.  She also has pain in her legs and her left knee.  She thinks she may have plantar fasciitis on the feet but arthritis in the left knee.  ALLERGIES:  is allergic to amoxicillin, ciprofloxacin, and penicillins.  MEDICATIONS:  Current Outpatient Medications  Medication Sig Dispense Refill  . Cholecalciferol (VITAMIN D-3 PO) Take 6,000 Units by mouth daily.     . diphenoxylate-atropine (LOMOTIL) 2.5-0.025 MG tablet Take 1 tablet by mouth 4 (four) times daily as needed for diarrhea or loose stools. (Patient not taking: Reported on 09/18/2020) 30 tablet 2  . lidocaine-prilocaine (EMLA) cream Apply 1 application topically as needed.    Marland Kitchen lisinopril (ZESTRIL) 20 MG tablet TAKE 1 TABLET DAILY (Patient taking differently: Take  10 mg by mouth daily. Takes at night) 90 tablet 3  . MAGNESIUM CITRATE PO Take 500 mcg by mouth at bedtime as needed (sleep). Only when having trouble sleeping    . SYNTHROID 75 MCG  tablet TAKE 1 TABLET ON MONDAY THROUGH SUNDAY (Patient taking differently: Take 75 mcg by mouth daily before breakfast.) 90 tablet 3  . vitamin B-12 (CYANOCOBALAMIN) 1000 MCG tablet Take 1,000 mcg by mouth daily.     No current facility-administered medications for this visit.    PHYSICAL EXAMINATION: ECOG PERFORMANCE STATUS: 1 - Symptomatic but completely ambulatory  Vitals:   10/01/20 1031  BP: (!) 152/92  Pulse: 83  Resp: 18  Temp: (!) 97.3 F (36.3 C)  SpO2: 98%   Filed Weights   10/01/20 1031  Weight: 194 lb 11.2 oz (88.3 kg)    LABORATORY DATA:  I have reviewed the data as listed CMP Latest Ref Rng & Units 09/10/2020 08/16/2020 07/30/2020  Glucose 70 - 99 mg/dL 95 98 97  BUN 6 - 20 mg/dL _0 Creatinine 0.44 - 1.00 mg/dL 0.78 0.79 0.73  Sodium 135 - 145 mmol/L 143 141 143  Potassium 3.5 - 5.1 mmol/L 3.9 3.3(L) 3.1(L)  Chloride 98 - 111 mmol/L 108 107 109  CO2 22 - 32 mmol/L _1 Calcium 8.9 - 10.3 mg/dL 9.2 8.7(L) 8.7(L)  Total Protein 6.5 - 8.1 g/dL 7.2 - 7.0  Total Bilirubin 0.3 - 1.2 mg/dL 0.3 - 0.3  Alkaline Phos 38 - 126 U/L 88 - 84  AST 15 - 41 U/L 20 - 19  ALT 0 - 44 U/L 12 - 15    Lab Results  Component Value Date   WBC 4.3 10/01/2020   HGB 12.6 10/01/2020   HCT 37.0 10/01/2020   MCV 97.6 10/01/2020   PLT 225 10/01/2020   NEUTROABS 2.6 10/01/2020    ASSESSMENT & PLAN:  Malignant neoplasm of upper-outer quadrant of right breast in female, estrogen receptor negative ([place]) 03/28/2020:Patient palpated a right breast mass x2 wks. Mammogram and US showed a 2.0cm mass at the 10 o'clock position in the right breast and two enlarged right axillary lymph nodes, 2.0cm and 1.7cm. 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 Satellite lesion biopsy: DCIS ER 0%, PR 0%  Treatment plan: 1. Neoadjuvant chemotherapy with [place] Perjeta 6 cycles followed by HerceptinPerjeta versus Kadcylamaintenance  for 1 year 2. 08/20/2020:Right lumpectomy [name]): no residual carcinoma, 7 right axillary lymph nodes negative for carcinoma. 3. Followed by adjuvant radiation therapy if patient had lumpectomy ----------------------------------------------------------------------------------------------------------------------------------------- Current treatment: Completed 6 cycles of [place], continue with Herceptin Perjeta maintenance (to be completed 04/08/2021) Echocardiogram 09/17/2020: EF 60 to 65%   We will discontinue potassium supplementation.  Severe leg pains: Possibly due to plantar fasciitis. Nail changes: She has lost one of her toenails and she thinks she may lose several of her fingers nails as well as other toenails as well.  Adjuvant radiation: Started 09/26/2020  Herceptin and Perjeta toxicities: She feels like after Perjeta is complete she gets severe fatigue that lasted for 24 hours.  I discussed with her that if the symptoms get worse then we may discontinue Perjeta.  Return to clinic every 3 weeks for Herceptin Perjeta and every 6 weeks with follow-up with me.     No orders of the defined types were placed in this encounter.  The patient has a good understanding  of the overall plan. she agrees with it. she will call with any problems that may develop before the next visit here.  Total time spent: 30 mins including face to face time and time spent for planning, charting and coordination of care  Rulon Eisenmenger, MD, MPH 10/01/2020  I, Cloyde Reams Dorshimer, am acting as scribe for Dr. Nicholas Lose.  I have reviewed the above documentation for accuracy and completeness, and I agree with the above.

## 2020-10-01 ENCOUNTER — Other Ambulatory Visit: Payer: Self-pay

## 2020-10-01 ENCOUNTER — Inpatient Hospital Stay (HOSPITAL_BASED_OUTPATIENT_CLINIC_OR_DEPARTMENT_OTHER): Admitting: Hematology and Oncology

## 2020-10-01 ENCOUNTER — Ambulatory Visit
Admission: RE | Admit: 2020-10-01 | Discharge: 2020-10-01 | Disposition: A | Discharge status: Home / Self Care | Source: Ambulatory Visit | Attending: Radiation Oncology | Admitting: Radiation Oncology

## 2020-10-01 ENCOUNTER — Inpatient Hospital Stay

## 2020-10-01 ENCOUNTER — Other Ambulatory Visit: Payer: Self-pay | Admitting: Hematology and Oncology

## 2020-10-01 DIAGNOSIS — Z5112 Encounter for antineoplastic immunotherapy: Secondary | ICD-10-CM | POA: Insufficient documentation

## 2020-10-01 DIAGNOSIS — C50411 Malignant neoplasm of upper-outer quadrant of right female breast: Secondary | ICD-10-CM

## 2020-10-01 DIAGNOSIS — Z95828 Presence of other vascular implants and grafts: Secondary | ICD-10-CM

## 2020-10-01 DIAGNOSIS — Z9221 Personal history of antineoplastic chemotherapy: Secondary | ICD-10-CM | POA: Insufficient documentation

## 2020-10-01 DIAGNOSIS — Z923 Personal history of irradiation: Secondary | ICD-10-CM | POA: Insufficient documentation

## 2020-10-01 DIAGNOSIS — C50919 Malignant neoplasm of unspecified site of unspecified female breast: Secondary | ICD-10-CM

## 2020-10-01 DIAGNOSIS — Z171 Estrogen receptor negative status [ER-]: Secondary | ICD-10-CM | POA: Insufficient documentation

## 2020-10-01 DIAGNOSIS — Z510 Encounter for antineoplastic radiation therapy: Secondary | ICD-10-CM | POA: Diagnosis not present

## 2020-10-01 DIAGNOSIS — Z79899 Other long term (current) drug therapy: Secondary | ICD-10-CM | POA: Insufficient documentation

## 2020-10-01 LAB — CMP (CANCER CENTER ONLY)
ALT: 14 U/L (ref 0–44)
AST: 20 U/L (ref 15–41)
Albumin: 3.8 g/dL (ref 3.5–5.0)
Alkaline Phosphatase: 93 U/L (ref 38–126)
Anion gap: 8 (ref 5–15)
BUN: 13 mg/dL (ref 6–20)
CO2: 28 mmol/L (ref 22–32)
Calcium: 9.3 mg/dL (ref 8.9–10.3)
Chloride: 107 mmol/L (ref 98–111)
Creatinine: 0.76 mg/dL (ref 0.44–1.00)
GFR, Estimated: >60 mL/min (ref >60)
Glucose, Bld: 94 mg/dL (ref 70–99)
Potassium: 3.7 mmol/L (ref 3.5–5.1)
Sodium: 143 mmol/L (ref 135–145)
Total Bilirubin: 0.6 mg/dL (ref 0.3–1.2)
Total Protein: 7.4 g/dL (ref 6.5–8.1)

## 2020-10-01 LAB — CBC WITH DIFFERENTIAL (CANCER CENTER ONLY)
Abs Immature Granulocytes: 0.01 10*3/uL (ref 0.00–0.07)
Basophils Absolute: 0 10*3/uL (ref 0.0–0.1)
Basophils Relative: 1 %
Eosinophils Absolute: 0.1 10*3/uL (ref 0.0–0.5)
Eosinophils Relative: 2 %
HCT: 37 % (ref 36.0–46.0)
Hemoglobin: 12.6 g/dL (ref 12.0–15.0)
Immature Granulocytes: 0 %
Lymphocytes Relative: 24 %
Lymphs Abs: 1 10*3/uL (ref 0.7–4.0)
MCH: 33.2 pg (ref 26.0–34.0)
MCHC: 34.1 g/dL (ref 30.0–36.0)
MCV: 97.6 fL (ref 80.0–100.0)
Monocytes Absolute: 0.6 10*3/uL (ref 0.1–1.0)
Monocytes Relative: 13 %
Neutro Abs: 2.6 10*3/uL (ref 1.7–7.7)
Neutrophils Relative %: 60 %
Platelet Count: 225 10*3/uL (ref 150–400)
RBC: 3.79 MIL/uL — ABNORMAL LOW (ref 3.87–5.11)
RDW: 13 % (ref 11.5–15.5)
WBC Count: 4.3 10*3/uL (ref 4.0–10.5)
nRBC: 0 % (ref 0.0–0.2)

## 2020-10-01 MED ORDER — HEPARIN SOD (PORK) LOCK FLUSH 100 UNIT/ML IV SOLN
500.0000 [IU] | Freq: Once | INTRAVENOUS | Status: AC | PRN
  Administered 2020-10-01: 500 [IU]
  Filled 2020-10-01: qty 5

## 2020-10-01 MED ORDER — DIPHENHYDRAMINE HCL 25 MG PO CAPS
50.0000 mg | ORAL_CAPSULE | Freq: Once | ORAL | Status: AC
  Administered 2020-10-01: 50 mg via ORAL

## 2020-10-01 MED ORDER — TRASTUZUMAB-DKST CHEMO 150 MG IV SOLR
6.0000 mg/kg | Freq: Once | INTRAVENOUS | Status: AC
  Administered 2020-10-01: 525 mg via INTRAVENOUS
  Filled 2020-10-01: qty 25

## 2020-10-01 MED ORDER — SODIUM CHLORIDE 0.9 % IV SOLN
420.0000 mg | Freq: Once | INTRAVENOUS | Status: AC
  Administered 2020-10-01: 420 mg via INTRAVENOUS
  Filled 2020-10-01: qty 14

## 2020-10-01 MED ORDER — ALRA NON-METALLIC DEODORANT (RAD-ONC)
1.0000 | Freq: Once | TOPICAL | Status: DC
Start: 2020-10-01 — End: 2020-10-02

## 2020-10-01 MED ORDER — SODIUM CHLORIDE 0.9 % IV SOLN
Freq: Once | INTRAVENOUS | Status: AC
  Filled 2020-10-01: qty 250

## 2020-10-01 MED ORDER — ACETAMINOPHEN 325 MG PO TABS
ORAL_TABLET | ORAL | Status: AC
  Filled 2020-10-01: qty 2

## 2020-10-01 MED ORDER — SODIUM CHLORIDE 0.9% FLUSH
10.0000 mL | INTRAVENOUS | Status: DC | PRN
  Administered 2020-10-01: 10 mL
  Filled 2020-10-01: qty 10

## 2020-10-01 MED ORDER — ACETAMINOPHEN 325 MG PO TABS
650.0000 mg | ORAL_TABLET | Freq: Once | ORAL | Status: AC
  Administered 2020-10-01: 650 mg via ORAL

## 2020-10-01 MED ORDER — DIPHENHYDRAMINE HCL 25 MG PO CAPS
ORAL_CAPSULE | ORAL | Status: AC
  Filled 2020-10-01: qty 2

## 2020-10-01 MED ORDER — SODIUM CHLORIDE 0.9% FLUSH
10.0000 mL | Freq: Once | INTRAVENOUS | Status: AC
  Administered 2020-10-01: 10 mL
  Filled 2020-10-01: qty 10

## 2020-10-01 MED ORDER — RADIAPLEXRX EX GEL
Freq: Once | CUTANEOUS | Status: DC
Start: 2020-10-01 — End: 2020-10-02

## 2020-10-01 MED ORDER — VENLAFAXINE HCL ER 37.5 MG PO CP24: 37.5000 mg | ORAL_CAPSULE | Freq: Every day | ORAL | 3 refills | Status: DC

## 2020-10-01 NOTE — Patient Instructions (Signed)
[facility]  Discharge Instructions: Thank you for choosing [facility] to provide your oncology and hematology care.   If you have a lab appointment with the [facility], please go directly to the [facility] and check in at the registration area.   Wear comfortable clothing and clothing appropriate for easy access to any Portacath or PICC line.   We strive to give you quality time with your provider. You may need to reschedule your appointment if you arrive late (15 or more minutes).  Arriving late affects you and other patients whose appointments are after yours.  Also, if you miss three or more appointments without notifying the office, you may be dismissed from the clinic at the provider's discretion.      For prescription refill requests, have your pharmacy contact our office and allow 72 hours for refills to be completed.    Today you received the following chemotherapy and/or immunotherapy agents herceptin, perjeta.     To help prevent nausea and vomiting after your treatment, we encourage you to take your nausea medication as directed.  BELOW ARE SYMPTOMS THAT SHOULD BE REPORTED IMMEDIATELY: . *FEVER GREATER THAN 100.4 F (38 C) OR HIGHER . *CHILLS OR SWEATING . *NAUSEA AND VOMITING THAT IS NOT CONTROLLED WITH YOUR NAUSEA MEDICATION . *UNUSUAL SHORTNESS OF BREATH . *UNUSUAL BRUISING OR BLEEDING . *URINARY PROBLEMS (pain or burning when urinating, or frequent urination) . *BOWEL PROBLEMS (unusual diarrhea, constipation, pain near the anus) . TENDERNESS IN MOUTH AND THROAT WITH OR WITHOUT PRESENCE OF ULCERS (sore throat, sores in mouth, or a toothache) . UNUSUAL RASH, SWELLING OR PAIN  . UNUSUAL VAGINAL DISCHARGE OR ITCHING   Items with * indicate a potential emergency and should be followed up as soon as possible or go to the Emergency Department if any problems should occur.  Please show the CHEMOTHERAPY ALERT CARD or  IMMUNOTHERAPY ALERT CARD at check-in to the Emergency Department and triage nurse.  Should you have questions after your visit or need to cancel or reschedule your appointment, please contact [facility]  Dept: [phone]  and follow the prompts.  Office hours are 8:00 a.m. to 4:30 p.m. Monday - Friday. Please note that voicemails left after 4:00 p.m. may not be returned until the following business day.  We are closed weekends and major holidays. You have access to a nurse at all times for urgent questions. Please call the main number to the clinic Dept: [phone] and follow the prompts.   For any non-urgent questions, you may also contact your provider using MyChart. We now offer e-Visits for anyone 9 and older to request care online for non-urgent symptoms. For details visit [email or web address].   Also download the MyChart app! Go to the app store, search "MyChart", open the app, select [facility], and log in with your MyChart username and password.  Due to Covid, a mask is required upon entering the hospital/clinic. If you do not have a mask, one will be given to you upon arrival. For doctor visits, patients may have 1 support person aged 79 or older with them. For treatment visits, patients cannot have anyone with them due to current Covid guidelines and our immunocompromised population.

## 2020-10-01 NOTE — Assessment & Plan Note (Signed)
03/28/2020:Patient palpated a right breast mass x2 wks. Mammogram and US showed a 2.0cm mass at the 10 o'clock position in the right breast and two enlarged right axillary lymph nodes, 2.0cm and 1.7cm. 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 Satellite lesion biopsy: DCIS ER 0%, PR 0%  Treatment plan: 1. Neoadjuvant chemotherapy with [place] Perjeta 6 cycles followed by HerceptinPerjeta versus Kadcylamaintenance for 1 year 2. 08/20/2020:Right lumpectomy [name]): no residual carcinoma, 7 right axillary lymph nodes negative for carcinoma. 3. Followed by adjuvant radiation therapy if patient had lumpectomy ----------------------------------------------------------------------------------------------------------------------------------------- Current treatment: Completed 6 cycles of TCH Perjeta, continue with Herceptin Perjeta maintenance Echocardiogram 09/17/2020: EF 60 to 65%   Adjuvant radiation: Started 09/26/2020  Return to clinic every 3 weeks for Herceptin Perjeta and every 6 weeks with follow-up with me.

## 2020-10-02 ENCOUNTER — Telehealth: Payer: Self-pay | Admitting: Hematology and Oncology

## 2020-10-02 ENCOUNTER — Ambulatory Visit
Admission: RE | Admit: 2020-10-02 | Discharge: 2020-10-02 | Disposition: A | Discharge status: Home / Self Care | Source: Ambulatory Visit | Attending: Radiation Oncology | Admitting: Radiation Oncology

## 2020-10-02 ENCOUNTER — Other Ambulatory Visit: Payer: Self-pay

## 2020-10-02 DIAGNOSIS — Z510 Encounter for antineoplastic radiation therapy: Secondary | ICD-10-CM | POA: Diagnosis not present

## 2020-10-02 NOTE — Telephone Encounter (Signed)
Scheduled appointment per 05/17 los. Patient will receive calender.  

## 2020-10-03 ENCOUNTER — Ambulatory Visit
Admission: RE | Admit: 2020-10-03 | Discharge: 2020-10-03 | Disposition: A | Discharge status: Home / Self Care | Source: Ambulatory Visit | Attending: Radiation Oncology | Admitting: Radiation Oncology

## 2020-10-03 DIAGNOSIS — Z510 Encounter for antineoplastic radiation therapy: Secondary | ICD-10-CM | POA: Diagnosis not present

## 2020-10-04 ENCOUNTER — Ambulatory Visit
Admission: RE | Admit: 2020-10-04 | Discharge: 2020-10-04 | Disposition: A | Discharge status: Home / Self Care | Source: Ambulatory Visit | Attending: Radiation Oncology | Admitting: Radiation Oncology

## 2020-10-04 DIAGNOSIS — Z510 Encounter for antineoplastic radiation therapy: Secondary | ICD-10-CM | POA: Diagnosis not present

## 2020-10-07 ENCOUNTER — Ambulatory Visit
Admission: RE | Admit: 2020-10-07 | Discharge: 2020-10-07 | Disposition: A | Discharge status: Home / Self Care | Source: Ambulatory Visit | Attending: Radiation Oncology | Admitting: Radiation Oncology

## 2020-10-07 DIAGNOSIS — Z510 Encounter for antineoplastic radiation therapy: Secondary | ICD-10-CM | POA: Diagnosis not present

## 2020-10-08 ENCOUNTER — Other Ambulatory Visit: Payer: Self-pay

## 2020-10-08 ENCOUNTER — Ambulatory Visit
Admission: RE | Admit: 2020-10-08 | Discharge: 2020-10-08 | Disposition: A | Discharge status: Home / Self Care | Source: Ambulatory Visit | Attending: Radiation Oncology | Admitting: Radiation Oncology

## 2020-10-08 DIAGNOSIS — Z510 Encounter for antineoplastic radiation therapy: Secondary | ICD-10-CM | POA: Diagnosis not present

## 2020-10-09 ENCOUNTER — Other Ambulatory Visit: Payer: Self-pay

## 2020-10-09 ENCOUNTER — Ambulatory Visit
Admission: RE | Admit: 2020-10-09 | Discharge: 2020-10-09 | Disposition: A | Discharge status: Home / Self Care | Source: Ambulatory Visit | Attending: Radiation Oncology | Admitting: Radiation Oncology

## 2020-10-09 DIAGNOSIS — Z510 Encounter for antineoplastic radiation therapy: Secondary | ICD-10-CM | POA: Diagnosis not present

## 2020-10-10 ENCOUNTER — Ambulatory Visit
Admission: RE | Admit: 2020-10-10 | Discharge: 2020-10-10 | Disposition: A | Discharge status: Home / Self Care | Source: Ambulatory Visit | Attending: Radiation Oncology | Admitting: Radiation Oncology

## 2020-10-10 DIAGNOSIS — Z510 Encounter for antineoplastic radiation therapy: Secondary | ICD-10-CM | POA: Diagnosis not present

## 2020-10-11 ENCOUNTER — Ambulatory Visit
Admission: RE | Admit: 2020-10-11 | Discharge: 2020-10-11 | Disposition: A | Discharge status: Home / Self Care | Source: Ambulatory Visit | Attending: Radiation Oncology | Admitting: Radiation Oncology

## 2020-10-11 ENCOUNTER — Other Ambulatory Visit: Payer: Self-pay

## 2020-10-11 DIAGNOSIS — Z510 Encounter for antineoplastic radiation therapy: Secondary | ICD-10-CM | POA: Diagnosis not present

## 2020-10-15 ENCOUNTER — Ambulatory Visit
Admission: RE | Admit: 2020-10-15 | Discharge: 2020-10-15 | Disposition: A | Discharge status: Home / Self Care | Source: Ambulatory Visit | Attending: Radiation Oncology | Admitting: Radiation Oncology

## 2020-10-15 DIAGNOSIS — Z510 Encounter for antineoplastic radiation therapy: Secondary | ICD-10-CM | POA: Diagnosis not present

## 2020-10-16 ENCOUNTER — Ambulatory Visit
Admission: RE | Admit: 2020-10-16 | Discharge: 2020-10-16 | Disposition: A | Discharge status: Home / Self Care | Source: Ambulatory Visit | Attending: Radiation Oncology | Admitting: Radiation Oncology

## 2020-10-16 DIAGNOSIS — Z5112 Encounter for antineoplastic immunotherapy: Secondary | ICD-10-CM | POA: Diagnosis present

## 2020-10-16 DIAGNOSIS — Z170 Estrogen receptor positive status [ER+]: Secondary | ICD-10-CM | POA: Diagnosis not present

## 2020-10-16 DIAGNOSIS — Z171 Estrogen receptor negative status [ER-]: Secondary | ICD-10-CM | POA: Insufficient documentation

## 2020-10-16 DIAGNOSIS — C50911 Malignant neoplasm of unspecified site of right female breast: Secondary | ICD-10-CM | POA: Insufficient documentation

## 2020-10-16 DIAGNOSIS — C50411 Malignant neoplasm of upper-outer quadrant of right female breast: Secondary | ICD-10-CM | POA: Insufficient documentation

## 2020-10-17 ENCOUNTER — Other Ambulatory Visit: Payer: Self-pay

## 2020-10-17 ENCOUNTER — Ambulatory Visit
Admission: RE | Admit: 2020-10-17 | Discharge: 2020-10-17 | Disposition: A | Discharge status: Home / Self Care | Source: Ambulatory Visit | Attending: Radiation Oncology | Admitting: Radiation Oncology

## 2020-10-17 DIAGNOSIS — Z5112 Encounter for antineoplastic immunotherapy: Secondary | ICD-10-CM | POA: Diagnosis not present

## 2020-10-18 ENCOUNTER — Ambulatory Visit
Admission: RE | Admit: 2020-10-18 | Discharge: 2020-10-18 | Disposition: A | Discharge status: Home / Self Care | Source: Ambulatory Visit | Attending: Radiation Oncology | Admitting: Radiation Oncology

## 2020-10-18 DIAGNOSIS — Z5112 Encounter for antineoplastic immunotherapy: Secondary | ICD-10-CM | POA: Diagnosis not present

## 2020-10-19 ENCOUNTER — Encounter: Payer: Self-pay | Admitting: Hematology and Oncology

## 2020-10-21 ENCOUNTER — Encounter: Payer: Self-pay | Admitting: Internal Medicine

## 2020-10-21 ENCOUNTER — Other Ambulatory Visit: Payer: Self-pay

## 2020-10-21 ENCOUNTER — Other Ambulatory Visit: Payer: Self-pay | Admitting: *Deleted

## 2020-10-21 ENCOUNTER — Ambulatory Visit

## 2020-10-21 DIAGNOSIS — Z171 Estrogen receptor negative status [ER-]: Secondary | ICD-10-CM

## 2020-10-21 DIAGNOSIS — C50411 Malignant neoplasm of upper-outer quadrant of right female breast: Secondary | ICD-10-CM

## 2020-10-21 MED ORDER — SYNTHROID 75 MCG PO TABS: 75.0000 ug | ORAL_TABLET | Freq: Every day | ORAL | 1 refills | Status: DC

## 2020-10-22 ENCOUNTER — Other Ambulatory Visit

## 2020-10-22 ENCOUNTER — Encounter: Payer: Self-pay | Admitting: Hematology and Oncology

## 2020-10-22 ENCOUNTER — Inpatient Hospital Stay: Attending: Hematology and Oncology

## 2020-10-22 ENCOUNTER — Ambulatory Visit
Admission: RE | Admit: 2020-10-22 | Discharge: 2020-10-22 | Disposition: A | Discharge status: Home / Self Care | Source: Ambulatory Visit | Attending: Radiation Oncology | Admitting: Radiation Oncology

## 2020-10-22 ENCOUNTER — Other Ambulatory Visit: Payer: Self-pay

## 2020-10-22 ENCOUNTER — Inpatient Hospital Stay

## 2020-10-22 VITALS — BP 149/98 | HR 88 | Temp 98.6°F | Resp 18

## 2020-10-22 DIAGNOSIS — C50411 Malignant neoplasm of upper-outer quadrant of right female breast: Secondary | ICD-10-CM | POA: Insufficient documentation

## 2020-10-22 DIAGNOSIS — Z170 Estrogen receptor positive status [ER+]: Secondary | ICD-10-CM | POA: Insufficient documentation

## 2020-10-22 DIAGNOSIS — Z171 Estrogen receptor negative status [ER-]: Secondary | ICD-10-CM

## 2020-10-22 DIAGNOSIS — Z5112 Encounter for antineoplastic immunotherapy: Secondary | ICD-10-CM | POA: Diagnosis not present

## 2020-10-22 DIAGNOSIS — Z95828 Presence of other vascular implants and grafts: Secondary | ICD-10-CM

## 2020-10-22 LAB — CBC WITH DIFFERENTIAL (CANCER CENTER ONLY)
Abs Immature Granulocytes: 0.02 10*3/uL (ref 0.00–0.07)
Basophils Absolute: 0 10*3/uL (ref 0.0–0.1)
Basophils Relative: 1 %
Eosinophils Absolute: 0.1 10*3/uL (ref 0.0–0.5)
Eosinophils Relative: 2 %
HCT: 37.2 % (ref 36.0–46.0)
Hemoglobin: 12.6 g/dL (ref 12.0–15.0)
Immature Granulocytes: 0 %
Lymphocytes Relative: 15 %
Lymphs Abs: 0.8 10*3/uL (ref 0.7–4.0)
MCH: 32.8 pg (ref 26.0–34.0)
MCHC: 33.9 g/dL (ref 30.0–36.0)
MCV: 96.9 fL (ref 80.0–100.0)
Monocytes Absolute: 0.6 10*3/uL (ref 0.1–1.0)
Monocytes Relative: 12 %
Neutro Abs: 3.6 10*3/uL (ref 1.7–7.7)
Neutrophils Relative %: 70 %
Platelet Count: 215 10*3/uL (ref 150–400)
RBC: 3.84 MIL/uL — ABNORMAL LOW (ref 3.87–5.11)
RDW: 13 % (ref 11.5–15.5)
WBC Count: 5.1 10*3/uL (ref 4.0–10.5)
nRBC: 0 % (ref 0.0–0.2)

## 2020-10-22 LAB — CMP (CANCER CENTER ONLY)
ALT: 13 U/L (ref 0–44)
AST: 20 U/L (ref 15–41)
Albumin: 3.7 g/dL (ref 3.5–5.0)
Alkaline Phosphatase: 94 U/L (ref 38–126)
Anion gap: 11 (ref 5–15)
BUN: 11 mg/dL (ref 6–20)
CO2: 26 mmol/L (ref 22–32)
Calcium: 9.2 mg/dL (ref 8.9–10.3)
Chloride: 105 mmol/L (ref 98–111)
Creatinine: 0.83 mg/dL (ref 0.44–1.00)
GFR, Estimated: >60 mL/min (ref >60)
Glucose, Bld: 113 mg/dL — ABNORMAL HIGH (ref 70–99)
Potassium: 3.5 mmol/L (ref 3.5–5.1)
Sodium: 142 mmol/L (ref 135–145)
Total Bilirubin: 0.5 mg/dL (ref 0.3–1.2)
Total Protein: 7.5 g/dL (ref 6.5–8.1)

## 2020-10-22 MED ORDER — SODIUM CHLORIDE 0.9% FLUSH
10.0000 mL | Freq: Once | INTRAVENOUS | Status: AC
  Administered 2020-10-22: 10 mL
  Filled 2020-10-22: qty 10

## 2020-10-22 MED ORDER — ACETAMINOPHEN 325 MG PO TABS
650.0000 mg | ORAL_TABLET | Freq: Once | ORAL | Status: AC
  Administered 2020-10-22: 650 mg via ORAL

## 2020-10-22 MED ORDER — SODIUM CHLORIDE 0.9 % IV SOLN
420.0000 mg | Freq: Once | INTRAVENOUS | Status: AC
  Administered 2020-10-22: 420 mg via INTRAVENOUS
  Filled 2020-10-22: qty 14

## 2020-10-22 MED ORDER — SODIUM CHLORIDE 0.9 % IV SOLN
Freq: Once | INTRAVENOUS | Status: AC
  Filled 2020-10-22: qty 250

## 2020-10-22 MED ORDER — DIPHENHYDRAMINE HCL 25 MG PO CAPS
50.0000 mg | ORAL_CAPSULE | Freq: Once | ORAL | Status: AC
  Administered 2020-10-22: 50 mg via ORAL

## 2020-10-22 MED ORDER — TRASTUZUMAB-DKST CHEMO 150 MG IV SOLR
6.0000 mg/kg | Freq: Once | INTRAVENOUS | Status: AC
  Administered 2020-10-22: 525 mg via INTRAVENOUS
  Filled 2020-10-22: qty 25

## 2020-10-22 NOTE — Patient Instructions (Signed)
[facility]  Discharge Instructions: Thank you for choosing [facility] to provide your oncology and hematology care.   If you have a lab appointment with the [facility], please go directly to the [facility] and check in at the registration area.   Wear comfortable clothing and clothing appropriate for easy access to any Portacath or PICC line.   We strive to give you quality time with your provider. You may need to reschedule your appointment if you arrive late (15 or more minutes).  Arriving late affects you and other patients whose appointments are after yours.  Also, if you miss three or more appointments without notifying the office, you may be dismissed from the clinic at the provider's discretion.      For prescription refill requests, have your pharmacy contact our office and allow 72 hours for refills to be completed.    Today you received the following chemotherapy and/or immunotherapy agents herceptin, perjeta.     To help prevent nausea and vomiting after your treatment, we encourage you to take your nausea medication as directed.  BELOW ARE SYMPTOMS THAT SHOULD BE REPORTED IMMEDIATELY: . *FEVER GREATER THAN 100.4 F (38 C) OR HIGHER . *CHILLS OR SWEATING . *NAUSEA AND VOMITING THAT IS NOT CONTROLLED WITH YOUR NAUSEA MEDICATION . *UNUSUAL SHORTNESS OF BREATH . *UNUSUAL BRUISING OR BLEEDING . *URINARY PROBLEMS (pain or burning when urinating, or frequent urination) . *BOWEL PROBLEMS (unusual diarrhea, constipation, pain near the anus) . TENDERNESS IN MOUTH AND THROAT WITH OR WITHOUT PRESENCE OF ULCERS (sore throat, sores in mouth, or a toothache) . UNUSUAL RASH, SWELLING OR PAIN  . UNUSUAL VAGINAL DISCHARGE OR ITCHING   Items with * indicate a potential emergency and should be followed up as soon as possible or go to the Emergency Department if any problems should occur.  Please show the CHEMOTHERAPY ALERT CARD or  IMMUNOTHERAPY ALERT CARD at check-in to the Emergency Department and triage nurse.  Should you have questions after your visit or need to cancel or reschedule your appointment, please contact [facility]  Dept: [phone]  and follow the prompts.  Office hours are 8:00 a.m. to 4:30 p.m. Monday - Friday. Please note that voicemails left after 4:00 p.m. may not be returned until the following business day.  We are closed weekends and major holidays. You have access to a nurse at all times for urgent questions. Please call the main number to the clinic Dept: [phone] and follow the prompts.   For any non-urgent questions, you may also contact your provider using MyChart. We now offer e-Visits for anyone 9 and older to request care online for non-urgent symptoms. For details visit [email or web address].   Also download the MyChart app! Go to the app store, search "MyChart", open the app, select [facility], and log in with your MyChart username and password.  Due to Covid, a mask is required upon entering the hospital/clinic. If you do not have a mask, one will be given to you upon arrival. For doctor visits, patients may have 1 support person aged 79 or older with them. For treatment visits, patients cannot have anyone with them due to current Covid guidelines and our immunocompromised population.

## 2020-10-23 ENCOUNTER — Ambulatory Visit
Admission: RE | Admit: 2020-10-23 | Discharge: 2020-10-23 | Disposition: A | Discharge status: Home / Self Care | Source: Ambulatory Visit | Attending: Radiation Oncology | Admitting: Radiation Oncology

## 2020-10-23 ENCOUNTER — Other Ambulatory Visit: Payer: Self-pay

## 2020-10-23 DIAGNOSIS — Z5112 Encounter for antineoplastic immunotherapy: Secondary | ICD-10-CM | POA: Diagnosis not present

## 2020-10-24 ENCOUNTER — Ambulatory Visit
Admission: RE | Admit: 2020-10-24 | Discharge: 2020-10-24 | Disposition: A | Discharge status: Home / Self Care | Source: Ambulatory Visit | Attending: Radiation Oncology | Admitting: Radiation Oncology

## 2020-10-24 DIAGNOSIS — Z5112 Encounter for antineoplastic immunotherapy: Secondary | ICD-10-CM | POA: Diagnosis not present

## 2020-10-25 ENCOUNTER — Other Ambulatory Visit: Payer: Self-pay

## 2020-10-25 ENCOUNTER — Ambulatory Visit
Admission: RE | Admit: 2020-10-25 | Discharge: 2020-10-25 | Disposition: A | Discharge status: Home / Self Care | Source: Ambulatory Visit | Attending: Radiation Oncology | Admitting: Radiation Oncology

## 2020-10-25 DIAGNOSIS — Z5112 Encounter for antineoplastic immunotherapy: Secondary | ICD-10-CM | POA: Diagnosis not present

## 2020-10-28 ENCOUNTER — Other Ambulatory Visit: Payer: Self-pay

## 2020-10-28 ENCOUNTER — Ambulatory Visit
Admission: RE | Admit: 2020-10-28 | Discharge: 2020-10-28 | Disposition: A | Discharge status: Home / Self Care | Source: Ambulatory Visit | Attending: Radiation Oncology | Admitting: Radiation Oncology

## 2020-10-28 DIAGNOSIS — Z5112 Encounter for antineoplastic immunotherapy: Secondary | ICD-10-CM | POA: Diagnosis not present

## 2020-10-29 ENCOUNTER — Other Ambulatory Visit: Payer: Self-pay

## 2020-10-29 ENCOUNTER — Ambulatory Visit: Admitting: Radiation Oncology

## 2020-10-29 ENCOUNTER — Ambulatory Visit
Admission: RE | Admit: 2020-10-29 | Discharge: 2020-10-29 | Disposition: A | Discharge status: Home / Self Care | Source: Ambulatory Visit | Attending: Radiation Oncology | Admitting: Radiation Oncology

## 2020-10-29 DIAGNOSIS — Z5112 Encounter for antineoplastic immunotherapy: Secondary | ICD-10-CM | POA: Diagnosis not present

## 2020-10-30 ENCOUNTER — Other Ambulatory Visit: Payer: Self-pay

## 2020-10-30 ENCOUNTER — Ambulatory Visit
Admission: RE | Admit: 2020-10-30 | Discharge: 2020-10-30 | Disposition: A | Discharge status: Home / Self Care | Source: Ambulatory Visit | Attending: Radiation Oncology | Admitting: Radiation Oncology

## 2020-10-30 DIAGNOSIS — C50411 Malignant neoplasm of upper-outer quadrant of right female breast: Secondary | ICD-10-CM

## 2020-10-30 DIAGNOSIS — Z5112 Encounter for antineoplastic immunotherapy: Secondary | ICD-10-CM | POA: Diagnosis not present

## 2020-10-30 DIAGNOSIS — Z171 Estrogen receptor negative status [ER-]: Secondary | ICD-10-CM

## 2020-10-30 MED ORDER — RADIAPLEXRX EX GEL
1.0000 "application " | Freq: Once | CUTANEOUS | Status: AC
  Administered 2020-10-30: 1 via TOPICAL

## 2020-10-31 ENCOUNTER — Ambulatory Visit
Admission: RE | Admit: 2020-10-31 | Discharge: 2020-10-31 | Disposition: A | Discharge status: Home / Self Care | Source: Ambulatory Visit | Attending: Radiation Oncology | Admitting: Radiation Oncology

## 2020-10-31 ENCOUNTER — Other Ambulatory Visit: Payer: Self-pay

## 2020-10-31 DIAGNOSIS — Z5112 Encounter for antineoplastic immunotherapy: Secondary | ICD-10-CM | POA: Diagnosis not present

## 2020-11-01 ENCOUNTER — Other Ambulatory Visit: Payer: Self-pay

## 2020-11-01 ENCOUNTER — Ambulatory Visit
Admission: RE | Admit: 2020-11-01 | Discharge: 2020-11-01 | Disposition: A | Discharge status: Home / Self Care | Source: Ambulatory Visit | Attending: Radiation Oncology | Admitting: Radiation Oncology

## 2020-11-01 DIAGNOSIS — Z5112 Encounter for antineoplastic immunotherapy: Secondary | ICD-10-CM | POA: Diagnosis not present

## 2020-11-03 ENCOUNTER — Encounter: Payer: Self-pay | Admitting: Hematology and Oncology

## 2020-11-04 ENCOUNTER — Ambulatory Visit
Admission: RE | Admit: 2020-11-04 | Discharge: 2020-11-04 | Disposition: A | Discharge status: Home / Self Care | Source: Ambulatory Visit | Attending: Radiation Oncology | Admitting: Radiation Oncology

## 2020-11-04 DIAGNOSIS — C50911 Malignant neoplasm of unspecified site of right female breast: Secondary | ICD-10-CM

## 2020-11-04 DIAGNOSIS — Z5112 Encounter for antineoplastic immunotherapy: Secondary | ICD-10-CM | POA: Diagnosis not present

## 2020-11-04 MED ORDER — SONAFINE EX EMUL
1.0000 | Freq: Once | CUTANEOUS | Status: AC
Start: 2020-11-04 — End: 2020-11-04
  Administered 2020-11-04: 1 via TOPICAL

## 2020-11-05 ENCOUNTER — Other Ambulatory Visit: Payer: Self-pay

## 2020-11-05 ENCOUNTER — Ambulatory Visit

## 2020-11-05 ENCOUNTER — Ambulatory Visit
Admission: RE | Admit: 2020-11-05 | Discharge: 2020-11-05 | Disposition: A | Discharge status: Home / Self Care | Source: Ambulatory Visit | Attending: Radiation Oncology | Admitting: Radiation Oncology

## 2020-11-05 DIAGNOSIS — Z5112 Encounter for antineoplastic immunotherapy: Secondary | ICD-10-CM | POA: Diagnosis not present

## 2020-11-06 ENCOUNTER — Ambulatory Visit

## 2020-11-06 ENCOUNTER — Ambulatory Visit
Admission: RE | Admit: 2020-11-06 | Discharge: 2020-11-06 | Disposition: A | Discharge status: Home / Self Care | Source: Ambulatory Visit | Attending: Radiation Oncology | Admitting: Radiation Oncology

## 2020-11-06 ENCOUNTER — Other Ambulatory Visit: Payer: Self-pay

## 2020-11-06 DIAGNOSIS — Z5112 Encounter for antineoplastic immunotherapy: Secondary | ICD-10-CM | POA: Diagnosis not present

## 2020-11-07 ENCOUNTER — Ambulatory Visit
Admission: RE | Admit: 2020-11-07 | Discharge: 2020-11-07 | Disposition: A | Discharge status: Home / Self Care | Source: Ambulatory Visit | Attending: Radiation Oncology | Admitting: Radiation Oncology

## 2020-11-07 ENCOUNTER — Encounter: Payer: Self-pay | Admitting: *Deleted

## 2020-11-07 ENCOUNTER — Other Ambulatory Visit: Payer: Self-pay

## 2020-11-07 DIAGNOSIS — Z5112 Encounter for antineoplastic immunotherapy: Secondary | ICD-10-CM | POA: Diagnosis not present

## 2020-11-08 ENCOUNTER — Ambulatory Visit
Admission: RE | Admit: 2020-11-08 | Discharge: 2020-11-08 | Disposition: A | Discharge status: Home / Self Care | Source: Ambulatory Visit | Attending: Radiation Oncology | Admitting: Radiation Oncology

## 2020-11-08 DIAGNOSIS — Z5112 Encounter for antineoplastic immunotherapy: Secondary | ICD-10-CM | POA: Diagnosis not present

## 2020-11-11 ENCOUNTER — Other Ambulatory Visit: Payer: Self-pay

## 2020-11-11 ENCOUNTER — Ambulatory Visit

## 2020-11-11 ENCOUNTER — Encounter: Payer: Self-pay | Admitting: Hematology and Oncology

## 2020-11-11 ENCOUNTER — Ambulatory Visit
Admission: RE | Admit: 2020-11-11 | Discharge: 2020-11-11 | Disposition: A | Discharge status: Home / Self Care | Source: Ambulatory Visit | Attending: Radiation Oncology | Admitting: Radiation Oncology

## 2020-11-11 DIAGNOSIS — Z5112 Encounter for antineoplastic immunotherapy: Secondary | ICD-10-CM | POA: Diagnosis not present

## 2020-11-12 ENCOUNTER — Inpatient Hospital Stay (HOSPITAL_BASED_OUTPATIENT_CLINIC_OR_DEPARTMENT_OTHER): Admitting: Hematology and Oncology

## 2020-11-12 ENCOUNTER — Other Ambulatory Visit

## 2020-11-12 ENCOUNTER — Ambulatory Visit
Admission: RE | Admit: 2020-11-12 | Discharge: 2020-11-12 | Disposition: A | Discharge status: Home / Self Care | Source: Ambulatory Visit | Attending: Radiation Oncology | Admitting: Radiation Oncology

## 2020-11-12 ENCOUNTER — Other Ambulatory Visit: Payer: Self-pay

## 2020-11-12 ENCOUNTER — Inpatient Hospital Stay

## 2020-11-12 DIAGNOSIS — Z171 Estrogen receptor negative status [ER-]: Secondary | ICD-10-CM

## 2020-11-12 DIAGNOSIS — C50411 Malignant neoplasm of upper-outer quadrant of right female breast: Secondary | ICD-10-CM | POA: Diagnosis not present

## 2020-11-12 DIAGNOSIS — Z95828 Presence of other vascular implants and grafts: Secondary | ICD-10-CM

## 2020-11-12 DIAGNOSIS — Z5112 Encounter for antineoplastic immunotherapy: Secondary | ICD-10-CM | POA: Diagnosis not present

## 2020-11-12 LAB — CBC WITH DIFFERENTIAL (CANCER CENTER ONLY)
Abs Immature Granulocytes: 0.01 10*3/uL (ref 0.00–0.07)
Basophils Absolute: 0 10*3/uL (ref 0.0–0.1)
Basophils Relative: 1 %
Eosinophils Absolute: 0.1 10*3/uL (ref 0.0–0.5)
Eosinophils Relative: 2 %
HCT: 38.7 % (ref 36.0–46.0)
Hemoglobin: 13.3 g/dL (ref 12.0–15.0)
Immature Granulocytes: 0 %
Lymphocytes Relative: 12 %
Lymphs Abs: 0.4 10*3/uL — ABNORMAL LOW (ref 0.7–4.0)
MCH: 33 pg (ref 26.0–34.0)
MCHC: 34.4 g/dL (ref 30.0–36.0)
MCV: 96 fL (ref 80.0–100.0)
Monocytes Absolute: 0.5 10*3/uL (ref 0.1–1.0)
Monocytes Relative: 14 %
Neutro Abs: 2.6 10*3/uL (ref 1.7–7.7)
Neutrophils Relative %: 71 %
Platelet Count: 189 10*3/uL (ref 150–400)
RBC: 4.03 MIL/uL (ref 3.87–5.11)
RDW: 13.2 % (ref 11.5–15.5)
WBC Count: 3.6 10*3/uL — ABNORMAL LOW (ref 4.0–10.5)
nRBC: 0 % (ref 0.0–0.2)

## 2020-11-12 LAB — CMP (CANCER CENTER ONLY)
ALT: 16 U/L (ref 0–44)
AST: 21 U/L (ref 15–41)
Albumin: 3.8 g/dL (ref 3.5–5.0)
Alkaline Phosphatase: 91 U/L (ref 38–126)
Anion gap: 8 (ref 5–15)
BUN: 10 mg/dL (ref 6–20)
CO2: 25 mmol/L (ref 22–32)
Calcium: 8.9 mg/dL (ref 8.9–10.3)
Chloride: 107 mmol/L (ref 98–111)
Creatinine: 0.77 mg/dL (ref 0.44–1.00)
GFR, Estimated: >60 mL/min (ref >60)
Glucose, Bld: 89 mg/dL (ref 70–99)
Potassium: 3.8 mmol/L (ref 3.5–5.1)
Sodium: 140 mmol/L (ref 135–145)
Total Bilirubin: 0.5 mg/dL (ref 0.3–1.2)
Total Protein: 7.6 g/dL (ref 6.5–8.1)

## 2020-11-12 MED ORDER — HEPARIN SOD (PORK) LOCK FLUSH 100 UNIT/ML IV SOLN
500.0000 [IU] | Freq: Once | INTRAVENOUS | Status: AC | PRN
Start: 2020-11-12 — End: 2020-11-12
  Administered 2020-11-12: 500 [IU]
  Filled 2020-11-12: qty 5

## 2020-11-12 MED ORDER — DIPHENHYDRAMINE HCL 25 MG PO CAPS: 50.0000 mg | ORAL_CAPSULE | Freq: Once | ORAL | Status: DC

## 2020-11-12 MED ORDER — ACETAMINOPHEN 325 MG PO TABS: 650.0000 mg | ORAL_TABLET | Freq: Once | ORAL | Status: DC

## 2020-11-12 MED ORDER — SODIUM CHLORIDE 0.9 % IV SOLN
Freq: Once | INTRAVENOUS | Status: AC
  Filled 2020-11-12: qty 250

## 2020-11-12 MED ORDER — SODIUM CHLORIDE 0.9% FLUSH
10.0000 mL | Freq: Once | INTRAVENOUS | Status: AC
  Administered 2020-11-12: 10 mL
  Filled 2020-11-12: qty 10

## 2020-11-12 MED ORDER — SODIUM CHLORIDE 0.9 % IV SOLN
6.0000 mg/kg | Freq: Once | INTRAVENOUS | Status: AC
  Administered 2020-11-12: 525 mg via INTRAVENOUS
  Filled 2020-11-12: qty 25

## 2020-11-12 MED ORDER — SODIUM CHLORIDE 0.9% FLUSH
10.0000 mL | INTRAVENOUS | Status: DC | PRN
  Administered 2020-11-12: 10 mL
  Filled 2020-11-12: qty 10

## 2020-11-12 MED ORDER — SODIUM CHLORIDE 0.9 % IV SOLN
420.0000 mg | Freq: Once | INTRAVENOUS | Status: AC
  Administered 2020-11-12: 420 mg via INTRAVENOUS
  Filled 2020-11-12: qty 14

## 2020-11-12 NOTE — Patient Instructions (Signed)
[facility]  Discharge Instructions: Thank you for choosing [facility] to provide your oncology and hematology care.   If you have a lab appointment with the [facility], please go directly to the [facility] and check in at the registration area.   Wear comfortable clothing and clothing appropriate for easy access to any Portacath or PICC line.   We strive to give you quality time with your provider. You may need to reschedule your appointment if you arrive late (15 or more minutes).  Arriving late affects you and other patients whose appointments are after yours.  Also, if you miss three or more appointments without notifying the office, you may be dismissed from the clinic at the provider's discretion.      For prescription refill requests, have your pharmacy contact our office and allow 72 hours for refills to be completed.    Today you received the following chemotherapy and/or immunotherapy agents trastuzumab/pertuzumab       To help prevent nausea and vomiting after your treatment, we encourage you to take your nausea medication as directed.  BELOW ARE SYMPTOMS THAT SHOULD BE REPORTED IMMEDIATELY: *FEVER GREATER THAN 100.4 F (38 C) OR HIGHER *CHILLS OR SWEATING *NAUSEA AND VOMITING THAT IS NOT CONTROLLED WITH YOUR NAUSEA MEDICATION *UNUSUAL SHORTNESS OF BREATH *UNUSUAL BRUISING OR BLEEDING *URINARY PROBLEMS (pain or burning when urinating, or frequent urination) *BOWEL PROBLEMS (unusual diarrhea, constipation, pain near the anus) TENDERNESS IN MOUTH AND THROAT WITH OR WITHOUT PRESENCE OF ULCERS (sore throat, sores in mouth, or a toothache) UNUSUAL RASH, SWELLING OR PAIN  UNUSUAL VAGINAL DISCHARGE OR ITCHING   Items with * indicate a potential emergency and should be followed up as soon as possible or go to the Emergency Department if any problems should occur.  Please show the CHEMOTHERAPY ALERT CARD or IMMUNOTHERAPY ALERT CARD at  check-in to the Emergency Department and triage nurse.  Should you have questions after your visit or need to cancel or reschedule your appointment, please contact [facility]  Dept: [phone]  and follow the prompts.  Office hours are 8:00 a.m. to 4:30 p.m. Monday - Friday. Please note that voicemails left after 4:00 p.m. may not be returned until the following business day.  We are closed weekends and major holidays. You have access to a nurse at all times for urgent questions. Please call the main number to the clinic Dept: [phone] and follow the prompts.   For any non-urgent questions, you may also contact your provider using MyChart. We now offer e-Visits for anyone 12 and older to request care online for non-urgent symptoms. For details visit [email or web address].   Also download the MyChart app! Go to the app store, search "MyChart", open the app, select [facility], and log in with your MyChart username and password.  Due to Covid, a mask is required upon entering the hospital/clinic. If you do not have a mask, one will be given to you upon arrival. For doctor visits, patients may have 1 support person aged 44 or older with them. For treatment visits, patients cannot have anyone with them due to current Covid guidelines and our immunocompromised population.

## 2020-11-12 NOTE — Progress Notes (Signed)
Patient Care Team: [name], MD as PCP - General (Internal Medicine) Rockwell Germany, RN as Oncology Nurse Navigator Mauro Kaufmann, RN as Oncology Nurse Navigator Coralie Keens, MD as Consulting Physician (General Surgery) Nicholas Lose, MD as Consulting Physician (Hematology and Oncology) [name], MD as Consulting Physician (Radiation Oncology)  DIAGNOSIS:  Encounter Diagnosis  Name Primary?   Malignant neoplasm of upper-outer quadrant of right breast in female, estrogen receptor negative ([place])     SUMMARY OF ONCOLOGIC HISTORY: Oncology History  Malignant neoplasm of upper-outer quadrant of right breast in female, estrogen receptor negative ([place])  03/28/2020 Initial Diagnosis   Patient palpated a right breast mass x2 wks. Mammogram and US showed a 2.0cm mass at the 10 o'clock position in the right breast and two enlarged right axillary lymph nodes, 2.0cm and 1.7cm. 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   04/16/2020 - 07/30/2020 Chemotherapy   TCHP X 6 followed by HP maintenance: Path CR        04/18/2020 Genetic Testing   Negative genetic testing: no pathogenic variants detected in [facility] Multi-Cancer Panel.  Variants of uncertain significance detected in CDH1 at c.1289T>G (p.Val430Gly) and EGFR at c.2873G>A (p.Arg958His).  The report date is April 18, 2020.   The Multi-Cancer Panel offered by [facility] includes sequencing and/or deletion duplication testing of the following 85 genes: AIP, ALK, APC, ATM, AXIN2,BAP1,  BARD1, BLM, BMPR1A, BRCA1, BRCA2, BRIP1, CASR, CDC73, CDH1, CDK4, CDKN1B, CDKN1C, CDKN2A (p14ARF), CDKN2A (p16INK4a), CEBPA, CHEK2, CTNNA1, DICER1, DIS3L2, EGFR, EPCAM (Deletion/duplication testing only), FH, FLCN, GATA2, GPC3, GREM1 (Promoter region deletion/duplication testing only), HOXB13 (c.251G>A, p.Gly84Glu), HRAS, KIT, MAX, MEN1, MET, MITF (c.952G>A, p.Glu318Lys variant only),  MLH1, MSH2, MSH3, MSH6, MUTYH, NBN, NF1, NF2, NTHL1, PALB2, PDGFRA, PHOX2B, PMS2, POLD1, POLE, POT1, PRKAR1A, PTCH1, PTEN, RAD50, RAD51C, RAD51D, RB1, RECQL4, RET, RNF43, RUNX1, SDHAF2, SDHA (sequence changes only), SDHB, SDHC, SDHD, SMAD4, SMARCA4, SMARCB1, SMARCE1, STK11, SUFU, TERC, TERT, TMEM127, TP53, TSC1, TSC2, VHL, WRN and WT1.    08/20/2020 Surgery   Right lumpectomy [name]): no residual carcinoma, 7 right axillary lymph nodes negative for carcinoma.   09/10/2020 -  Chemotherapy      Patient is on Antibody Plan: BREAST TRASTUZUMAB + PERTUZUMAB Q21D     09/26/2020 - 11/12/2020 Radiation Therapy   Adjuvant radiation     CHIEF COMPLIANT: Follow-up of breast cancer after adjuvant radiation  INTERVAL HISTORY: Athziry Millican is a 50 year old above-mentioned history of right breast cancer treated with neoadjuvant chemotherapy with [facility] Perjeta pathologic complete response.  She had lumpectomy and continued on Herceptin Perjeta maintenance.  She finishes radiation today.  She continues to be on Herceptin Perjeta maintenance.  This will be completed in November 2022.   ALLERGIES:  is allergic to amoxicillin, ciprofloxacin, and penicillins.  MEDICATIONS:  Current Outpatient Medications  Medication Sig Dispense Refill   Cholecalciferol (VITAMIN D-3 PO) Take 6,000 Units by mouth daily.      diphenoxylate-atropine (LOMOTIL) 2.5-0.025 MG tablet Take 1 tablet by mouth 4 (four) times daily as needed for diarrhea or loose stools. (Patient not taking: Reported on 09/18/2020) 30 tablet 2   lidocaine-prilocaine (EMLA) cream Apply 1 application topically as needed.     lisinopril (ZESTRIL) 20 MG tablet TAKE 1 TABLET DAILY (Patient taking differently: Take 10 mg by mouth daily. Takes at night) 90 tablet 3   MAGNESIUM CITRATE PO Take 500 mcg by mouth at bedtime as needed (sleep).  Only when having trouble sleeping     SYNTHROID 75 MCG tablet Take 1 tablet (75 mcg total) by mouth daily before breakfast. 90  tablet 1   vitamin B-12 (CYANOCOBALAMIN) 1000 MCG tablet Take 1,000 mcg by mouth daily.     No current facility-administered medications for this visit.    PHYSICAL EXAMINATION: ECOG PERFORMANCE STATUS: 1 - Symptomatic but completely ambulatory  There were no vitals filed for this visit. There were no vitals filed for this visit.    LABORATORY DATA:  I have reviewed the data as listed CMP Latest Ref Rng & Units 10/22/2020 10/01/2020 09/10/2020  Glucose 70 - 99 mg/dL 113(H) 94 95  BUN 6 - 20 mg/dL 11 13 7   Creatinine 0.44 - 1.00 mg/dL 0.83 0.76 0.78  Sodium 135 - 145 mmol/L 142 143 143  Potassium 3.5 - 5.1 mmol/L 3.5 3.7 3.9  Chloride 98 - 111 mmol/L 105 107 108  CO2 22 - 32 mmol/L 26 28 26   Calcium 8.9 - 10.3 mg/dL 9.2 9.3 9.2  Total Protein 6.5 - 8.1 g/dL 7.5 7.4 7.2  Total Bilirubin 0.3 - 1.2 mg/dL 0.5 0.6 0.3  Alkaline Phos 38 - 126 U/L 94 93 88  AST 15 - 41 U/L 20 20 20   ALT 0 - 44 U/L 13 14 12     Lab Results  Component Value Date   WBC 3.6 (L) 11/12/2020   HGB 13.3 11/12/2020   HCT 38.7 11/12/2020   MCV 96.0 11/12/2020   PLT 189 11/12/2020   NEUTROABS 2.6 11/12/2020    ASSESSMENT & PLAN:  Malignant neoplasm of upper-outer quadrant of right breast in female, estrogen receptor negative ([place]) 03/28/2020:Patient palpated a right breast mass x2 wks. Mammogram and US showed a 2.0cm mass at the 10 o'clock position in the right breast and two enlarged right axillary lymph nodes, 2.0cm and 1.7cm. 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 Satellite lesion biopsy: DCIS ER 0%, PR 0%   Treatment plan: 1. Neoadjuvant chemotherapy with TCH Perjeta 6 cycles followed by Herceptin Perjeta versus Kadcy[place] maintenance for 1 year 2. 08/20/2020:Right lumpectomy [name]): no residual carcinoma, 7 right axillary lymph nodes negative for carcinoma. 3. Followed by adjuvant radiation therapy if patient had  lumpectomy ----------------------------------------------------------------------------------------------------------------------------------------- Current treatment: Completed 6 cycles of [place], continue with Herceptin Perjeta maintenance (to be completed 04/08/2021) Echocardiogram 09/17/2020: EF 60 to 65%   Severe leg pains: Possibly due to plantar fasciitis. Nail changes: She has lost one of her toenails and she thinks she may lose several of her fingers nails as well as other toenails as well.   Adjuvant radiation: 09/26/2020-11/12/2020   Herceptin and Perjeta toxicities: Denies any diarrhea.  Does complain of fatigue. We discussed the role of antiestrogen therapy.  She is not very keen on taking any further medications.     Return to clinic every 3 weeks for Herceptin Perjeta and every 9 weeks with follow-up with me.     No orders of the defined types were placed in this encounter.  The patient has a good understanding of the overall plan. she agrees with it. she will call with any problems that may develop before the next visit here. Total time spent: 30 mins including face to face time and time spent for planning, charting and co-ordination of care   Harriette Ohara, MD 11/12/20

## 2020-11-12 NOTE — Assessment & Plan Note (Signed)
03/28/2020:Patient palpated a right breast mass x2 wks. Mammogram and US showed a 2.0cm mass at the 10 o'clock position in the right breast and two enlarged right axillary lymph nodes, 2.0cm and 1.7cm. 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 Satellite lesion biopsy: DCIS ER 0%, PR 0%  Treatment plan: 1. Neoadjuvant chemotherapy with [place] Perjeta 6 cycles followed by HerceptinPerjeta versus Kadcylamaintenance for 1 year 2.08/20/2020:Right lumpectomy [name]): no residual carcinoma, 7 right axillary lymph nodes negative for carcinoma. 3. Followed by adjuvant radiation therapy if patient had lumpectomy ----------------------------------------------------------------------------------------------------------------------------------------- Current treatment:Completed 6 cycles of [place] Perjeta, continue with Herceptin Perjeta maintenance (to be completed 04/08/2021) Echocardiogram 09/17/2020: EF 60 to 65%   We will discontinue potassium supplementation.  Severe leg pains: Possibly due to plantar fasciitis. Nail changes: She has lost one of her toenails and she thinks she may lose several of her fingers nails as well as other toenails as well.  Adjuvant radiation: 09/26/2020-11/12/2020  Herceptin and Perjeta toxicities: She feels like after Perjeta is complete she gets severe fatigue that lasted for 24 hours.  I discussed with her that if the symptoms get worse then we may discontinue Perjeta.  Return to clinic every 3 weeks for Herceptin Perjeta and every 6 weeks with follow-up with me.

## 2020-11-13 ENCOUNTER — Telehealth: Payer: Self-pay | Admitting: Hematology and Oncology

## 2020-11-13 NOTE — Telephone Encounter (Signed)
Scheduled per 6/28 los. Pt will receive an updated appt calendar per next visit appt notes

## 2020-11-25 ENCOUNTER — Telehealth: Payer: Self-pay | Admitting: Hematology and Oncology

## 2020-11-25 ENCOUNTER — Ambulatory Visit: Payer: Self-pay

## 2020-11-25 ENCOUNTER — Encounter: Payer: Self-pay | Admitting: Hematology and Oncology

## 2020-11-25 NOTE — Telephone Encounter (Signed)
Patient had COVID-19 symptoms starting 11/19/2020 Therefore recommend moving infusion from 12/03/2020 to 12/10/2020.

## 2020-11-26 ENCOUNTER — Telehealth: Payer: Self-pay | Admitting: Hematology and Oncology

## 2020-11-26 NOTE — Telephone Encounter (Signed)
Scheduled appointment per 07/12 sch msg. Patient is aware. 

## 2020-12-03 ENCOUNTER — Ambulatory Visit

## 2020-12-03 ENCOUNTER — Other Ambulatory Visit

## 2020-12-09 ENCOUNTER — Other Ambulatory Visit: Payer: Self-pay

## 2020-12-09 ENCOUNTER — Ambulatory Visit: Attending: Hematology and Oncology

## 2020-12-09 ENCOUNTER — Encounter: Payer: Self-pay | Admitting: Radiation Oncology

## 2020-12-09 VITALS — Wt 195.4 lb

## 2020-12-09 DIAGNOSIS — Z483 Aftercare following surgery for neoplasm: Secondary | ICD-10-CM | POA: Insufficient documentation

## 2020-12-09 NOTE — Therapy (Addendum)
[place], [place], [address] Phone: [phone]   Fax:  [phone]  Physical Therapy Treatment  Patient Details  Name: Shannon Kane MRN: [id number] Date of Birth: 1970-07-10 Referring Provider (PT): Dr. Coralie Keens   Encounter Date: 12/09/2020   PT End of Session - 12/09/20 0841     Visit Number 2   # unchanged due to screen only   PT Start Time 2353    PT Stop Time 0845    PT Time Calculation (min) 10 min    Activity Tolerance Patient tolerated treatment well    Behavior During Therapy [facility] for tasks assessed/performed             Past Medical History:  Diagnosis Date   Abnormal glucose    Breast cancer ([place]) 03/2020   right breast IDC   Family history of breast cancer 04/03/2020   Family history of ovarian cancer 04/03/2020   History of radiation therapy    Right breast- 09/25/20-11/12/20- Dr. [name]   Hypertension    Hyperthyroidism    in the past   Hypothyroidism    Iron deficiency anemia    Palpitation    Spontaneous ecchymoses    Tachycardia    Vitamin D deficiency     Past Surgical History:  Procedure Laterality Date   BREAST BIOPSY     BREAST EXCISIONAL BIOPSY     BREAST LUMPECTOMY WITH RADIOACTIVE SEED LOCALIZATION Right 08/20/2020   Procedure: RADIOACTIVE SEED GUIDED RIGHT BREAST LUMPECTOMY;  Surgeon: Coralie Keens, MD;  Location: [facility];  Service: General;  Laterality: Right;   CHOLECYSTECTOMY     COLONOSCOPY  05/18/2018   KNEE SURGERY     PORTACATH PLACEMENT Left 04/15/2020   Procedure: INSERTION PORT-A-CATH;  Surgeon: Coralie Keens, MD;  Location: [facility];  Service: General;  Laterality: Left;   RADIOACTIVE SEED [place] Right 08/20/2020   Procedure: RADIOACTIVE SEED TARGETED RIGHT AXILLARY LYMPH NODE DISSECTION;  Surgeon: Coralie Keens, MD;  Location: [facility];  Service: General;  Laterality: Right;    TONSILLECTOMY      Vitals:   12/09/20 0839  Kane: 195 lb 6.4 oz (88.6 kg)     Subjective Assessment - 12/09/20 0839     Subjective Pt returns for her 3 month L-Dex screen.    Pertinent History Patient was diagnosed on 03/14/2020 with right grade 2 invasive ductal carcinoma breast cancer.  She underwent neoadjuvant chemo followed by a right lumpectomy and targeted node dissection (7 negative lymph nodes) on 08/20/2020. It is ER/PR negative and HER2 positive with a Ki67 of 30%. She has a positive axillary lymph node.                    L-DEX FLOWSHEETS - 12/09/20 0800       L-DEX LYMPHEDEMA SCREENING   Measurement Type Unilateral    L-DEX MEASUREMENT EXTREMITY Upper Extremity    POSITION  Standing    DOMINANT SIDE Left    At Risk Side Right    BASELINE SCORE (UNILATERAL) 4.2    L-DEX SCORE (UNILATERAL) 3.9    VALUE CHANGE (UNILAT) -0.3                                    PT Long Term Goals - 09/09/20 1646       PT LONG TERM GOAL #1  Title Patient will demonstrate she has regained full shoulder ROM and function post operatively compared to baselines.    Time 6    Period Months    Status Achieved                   Plan - 12/09/20 8651     Clinical Impression Statement Pt returns for her 3 month L-Dex screen. Her change from baseline of -0.3 is WNLs so no further treatment is required at this time except to cont every 3 month L-Dex screens which pt is agreeable to.    PT Next Visit Plan Cont every 3 month L-Dex screens for up to 2 years from her SLNB.    Consulted and Agree with Plan of Care Patient             Patient will benefit from skilled therapeutic intervention in order to improve the following deficits and impairments:     Visit Diagnosis: Aftercare following surgery for neoplasm     Problem List Patient Active Problem List   Diagnosis Date Noted   Port-A-Cath in place 05/07/2020   Menorrhagia 04/19/2020    Uterine leiomyoma 04/19/2020   Genetic testing 04/19/2020   Family history of ovarian cancer 04/03/2020   Family history of leukemia 04/03/2020   Family history of breast cancer 04/03/2020   Malignant neoplasm of upper-outer quadrant of right breast in female, estrogen receptor negative ([place]) 03/28/2020   Infiltrating ductal carcinoma of breast ([place]) 03/27/2020   Overweight (BMI 25.0-29.9) 12/05/2019   Essential hypertension, benign 05/29/2018   Iritis of left eye 05/29/2018   Iron deficiency anemia due to chronic blood loss 05/29/2018   Primary hypothyroidism 05/29/2018   Stress incontinence in female 02/16/2016    Otelia Limes, PTA 12/09/2020, 7:16 PM  [place], [place], [address] Phone: [phone]   Fax:  [phone]  Name: Shannon Kane MRN: [id number] Date of Birth: 03/28/1971

## 2020-12-10 ENCOUNTER — Inpatient Hospital Stay: Attending: Hematology and Oncology

## 2020-12-10 ENCOUNTER — Inpatient Hospital Stay

## 2020-12-10 VITALS — BP 143/93 | HR 97 | Temp 98.0°F | Resp 18 | Wt 194.0 lb

## 2020-12-10 DIAGNOSIS — Z5112 Encounter for antineoplastic immunotherapy: Secondary | ICD-10-CM | POA: Insufficient documentation

## 2020-12-10 DIAGNOSIS — Z95828 Presence of other vascular implants and grafts: Secondary | ICD-10-CM

## 2020-12-10 DIAGNOSIS — Z171 Estrogen receptor negative status [ER-]: Secondary | ICD-10-CM | POA: Insufficient documentation

## 2020-12-10 DIAGNOSIS — Z923 Personal history of irradiation: Secondary | ICD-10-CM | POA: Diagnosis not present

## 2020-12-10 DIAGNOSIS — Z79899 Other long term (current) drug therapy: Secondary | ICD-10-CM | POA: Diagnosis not present

## 2020-12-10 DIAGNOSIS — C50411 Malignant neoplasm of upper-outer quadrant of right female breast: Secondary | ICD-10-CM

## 2020-12-10 LAB — CBC WITH DIFFERENTIAL (CANCER CENTER ONLY)
Abs Immature Granulocytes: 0.01 10*3/uL (ref 0.00–0.07)
Basophils Absolute: 0 10*3/uL (ref 0.0–0.1)
Basophils Relative: 1 %
Eosinophils Absolute: 0.1 10*3/uL (ref 0.0–0.5)
Eosinophils Relative: 2 %
HCT: 36.8 % (ref 36.0–46.0)
Hemoglobin: 12.7 g/dL (ref 12.0–15.0)
Immature Granulocytes: 0 %
Lymphocytes Relative: 20 %
Lymphs Abs: 0.7 10*3/uL (ref 0.7–4.0)
MCH: 32.1 pg (ref 26.0–34.0)
MCHC: 34.5 g/dL (ref 30.0–36.0)
MCV: 92.9 fL (ref 80.0–100.0)
Monocytes Absolute: 0.5 10*3/uL (ref 0.1–1.0)
Monocytes Relative: 13 %
Neutro Abs: 2.2 10*3/uL (ref 1.7–7.7)
Neutrophils Relative %: 64 %
Platelet Count: 199 10*3/uL (ref 150–400)
RBC: 3.96 MIL/uL (ref 3.87–5.11)
RDW: 13.6 % (ref 11.5–15.5)
WBC Count: 3.5 10*3/uL — ABNORMAL LOW (ref 4.0–10.5)
nRBC: 0 % (ref 0.0–0.2)

## 2020-12-10 LAB — CMP (CANCER CENTER ONLY)
ALT: 22 U/L (ref 0–44)
AST: 32 U/L (ref 15–41)
Albumin: 3.9 g/dL (ref 3.5–5.0)
Alkaline Phosphatase: 87 U/L (ref 38–126)
Anion gap: 9 (ref 5–15)
BUN: 10 mg/dL (ref 6–20)
CO2: 26 mmol/L (ref 22–32)
Calcium: 9.5 mg/dL (ref 8.9–10.3)
Chloride: 107 mmol/L (ref 98–111)
Creatinine: 0.83 mg/dL (ref 0.44–1.00)
GFR, Estimated: >60 mL/min (ref >60)
Glucose, Bld: 88 mg/dL (ref 70–99)
Potassium: 3.7 mmol/L (ref 3.5–5.1)
Sodium: 142 mmol/L (ref 135–145)
Total Bilirubin: 0.6 mg/dL (ref 0.3–1.2)
Total Protein: 7.8 g/dL (ref 6.5–8.1)

## 2020-12-10 MED ORDER — HEPARIN SOD (PORK) LOCK FLUSH 100 UNIT/ML IV SOLN
500.0000 [IU] | Freq: Once | INTRAVENOUS | Status: AC | PRN
Start: 2020-12-10 — End: 2020-12-10
  Administered 2020-12-10: 500 [IU]
  Filled 2020-12-10: qty 5

## 2020-12-10 MED ORDER — DIPHENHYDRAMINE HCL 25 MG PO CAPS: 50.0000 mg | ORAL_CAPSULE | Freq: Once | ORAL | Status: DC

## 2020-12-10 MED ORDER — SODIUM CHLORIDE 0.9% FLUSH
10.0000 mL | Freq: Once | INTRAVENOUS | Status: AC
  Administered 2020-12-10: 10 mL
  Filled 2020-12-10: qty 10

## 2020-12-10 MED ORDER — SODIUM CHLORIDE 0.9% FLUSH
10.0000 mL | INTRAVENOUS | Status: DC | PRN
  Administered 2020-12-10: 10 mL
  Filled 2020-12-10: qty 10

## 2020-12-10 MED ORDER — ACETAMINOPHEN 325 MG PO TABS: 650.0000 mg | ORAL_TABLET | Freq: Once | ORAL | Status: DC

## 2020-12-10 MED ORDER — SODIUM CHLORIDE 0.9 % IV SOLN
420.0000 mg | Freq: Once | INTRAVENOUS | Status: AC
  Administered 2020-12-10: 420 mg via INTRAVENOUS
  Filled 2020-12-10: qty 14

## 2020-12-10 MED ORDER — SODIUM CHLORIDE 0.9 % IV SOLN
Freq: Once | INTRAVENOUS | Status: AC
  Filled 2020-12-10: qty 250

## 2020-12-10 MED ORDER — TRASTUZUMAB-DKST CHEMO 150 MG IV SOLR
6.0000 mg/kg | Freq: Once | INTRAVENOUS | Status: AC
  Administered 2020-12-10: 525 mg via INTRAVENOUS
  Filled 2020-12-10: qty 25

## 2020-12-10 NOTE — Patient Instructions (Signed)
[facility] ONCOLOGY  Discharge Instructions: Thank you for choosing [facility] to provide your oncology and hematology care.   If you have a lab appointment with the [facility], please go directly to the [facility] and check in at the registration area.   Wear comfortable clothing and clothing appropriate for easy access to any Portacath or PICC line.   We strive to give you quality time with your provider. You may need to reschedule your appointment if you arrive late (15 or more minutes).  Arriving late affects you and other patients whose appointments are after yours.  Also, if you miss three or more appointments without notifying the office, you may be dismissed from the clinic at the provider's discretion.      For prescription refill requests, have your pharmacy contact our office and allow 72 hours for refills to be completed.    Today you received the following chemotherapy and/or immunotherapy agents trastuzumab/pertuzumab       To help prevent nausea and vomiting after your treatment, we encourage you to take your nausea medication as directed.  BELOW ARE SYMPTOMS THAT SHOULD BE REPORTED IMMEDIATELY: *FEVER GREATER THAN 100.4 F (38 C) OR HIGHER *CHILLS OR SWEATING *NAUSEA AND VOMITING THAT IS NOT CONTROLLED WITH YOUR NAUSEA MEDICATION *UNUSUAL SHORTNESS OF BREATH *UNUSUAL BRUISING OR BLEEDING *URINARY PROBLEMS (pain or burning when urinating, or frequent urination) *BOWEL PROBLEMS (unusual diarrhea, constipation, pain near the anus) TENDERNESS IN MOUTH AND THROAT WITH OR WITHOUT PRESENCE OF ULCERS (sore throat, sores in mouth, or a toothache) UNUSUAL RASH, SWELLING OR PAIN  UNUSUAL VAGINAL DISCHARGE OR ITCHING   Items with * indicate a potential emergency and should be followed up as soon as possible or go to the Emergency Department if any problems should occur.  Please show the CHEMOTHERAPY ALERT CARD or IMMUNOTHERAPY ALERT CARD at  check-in to the Emergency Department and triage nurse.  Should you have questions after your visit or need to cancel or reschedule your appointment, please contact [facility]  Dept: [phone]  and follow the prompts.  Office hours are 8:00 a.m. to 4:30 p.m. Monday - Friday. Please note that voicemails left after 4:00 p.m. may not be returned until the following business day.  We are closed weekends and major holidays. You have access to a nurse at all times for urgent questions. Please call the main number to the clinic Dept: [phone] and follow the prompts.   For any non-urgent questions, you may also contact your provider using MyChart. We now offer e-Visits for anyone 51 and older to request care online for non-urgent symptoms. For details visit [email or web address].   Also download the MyChart app! Go to the app store, search "MyChart", open the app, select [facility], and log in with your MyChart username and password.  Due to Covid, a mask is required upon entering the hospital/clinic. If you do not have a mask, one will be given to you upon arrival. For doctor visits, patients may have 1 support person aged 56 or older with them. For treatment visits, patients cannot have anyone with them due to current Covid guidelines and our immunocompromised population.

## 2020-12-13 NOTE — Progress Notes (Incomplete)
  Radiation Oncology         (336) [phone] ________________________________  Patient Name: Shannon Kane MRN: [id number] DOB: 1971/01/14 Referring Physician: [name] (Profile Not Attached) Date of Service: 11/12/2020 [facility], [place]  End Of Treatment Note  Diagnoses: C50.411-Malignant neoplasm of upper-outer quadrant of right female breast Z17.1-Estrogen receptor negative status [ER-]  Cancer Staging: Stage T1c, N1, Right Breast UOQ, Invasive Ductal Carcinoma with high-grade DCIS, ER- / PR- / Her2+, Grade 2  Intent: Curative  Radiation Treatment Dates: 09/25/2020 through 11/12/2020 Site Technique Total Dose (Gy) Dose per Fx (Gy) Completed Fx Beam Energies  Breast, Right: Breast_Rt 3D 50.4/50.4 1.8 28/28 6X, 10X  Breast, Right: Breast_Rt_Bst 3D 10/10 2 5/5 6X, 10X  Sclav-RT: SCV_Rt 3D 50.4/50.4 1.8 28/28 6X, 10X   Narrative: The patient tolerated radiation therapy relatively well. She reports having a good energy level. Patient continues to have pain to the right breast. Hyperpigmentation is noted to the patients right shoulder and right breast. The patient reports peeling to the right nipple as well as mild swelling and tenderness. She is currently on sonafine as directed.  The right breast area shows no active moist desquamation at this time.  Areas of hyperpigmentation changes and erythema were noted. No signs of infection were visualized.  Plan: The patient will follow-up with radiation oncology in one month .  ________________________________________________ -----------------------------------  Blair Promise, PhD, MD  This document serves as a record of services personally performed by [name], MD. It was created on his behalf by Roney Mans, a trained medical scribe. The creation of this record is based on the scribe's personal observations and the provider's statements to them. This document has been checked and approved by the attending  provider.

## 2020-12-15 NOTE — Progress Notes (Signed)
Radiation Oncology         [phone] ________________________________  Name: Shannon Kane MRN: [id number]  Date: 12/16/2020  DOB: Oct 03, 1970  Follow-Up Visit Note  CC: [name], MD  [name], MD    ICD-10-CM   1. Malignant neoplasm of upper-outer quadrant of right breast in female, estrogen receptor negative ([place])  C50.411    Z17.1       Diagnosis: Stage T1c, N1, Right Breast UOQ, Invasive Ductal Carcinoma with high-grade DCIS, ER- / PR- / Her2+, Grade 2   Interval Since Last Radiation: 1 month and 4 days  Intent: Curative  Radiation Treatment Dates: 09/25/2020 through 11/12/2020 Site Technique Total Dose (Gy) Dose per Fx (Gy) Completed Fx Beam Energies  Breast, Right: Breast_Rt 3D 50.4/50.4 1.8 28/28 6X, 10X  Breast, Right: Breast_Rt_Bst 3D 10/10 2 5/5 6X, 10X  Sclav-RT: SCV_Rt 3D 50.4/50.4 1.8 28/28 6X, 10X   Narrative:  The patient returns today for routine follow-up. She tolerated her recent radiation treatment relatively well. Upon physical examination on the day of her final treatment, the patient had notable hyperpigmentation to her right breast and right shoulder. The patient also reported pain to this area, as well as peeling, swelling, and tenderness to the right nipple.   Of note: the patient took a home COVID test on 11/25/20 which came back positive. The patient called Dr. Lindi Adie to enquire about antiviral treatment options.  She reports only feeling under the weather for approximately a week.  She should finish her immunotherapy in late November.  She denies any significant pain or tenderness in the right breast at this time.  She denies any nipple discharge or bleeding.  She reports getting a good result from her [place] evaluation recently.   Allergies:  is allergic to amoxicillin, ciprofloxacin, and penicillins.  Meds: Current Outpatient Medications  Medication Sig Dispense Refill   Cholecalciferol (VITAMIN D-3 PO) Take 6,000 Units by mouth daily.       lidocaine-prilocaine (EMLA) cream Apply 1 application topically as needed.     MAGNESIUM CITRATE PO Take 500 mcg by mouth at bedtime as needed (sleep). Only when having trouble sleeping     SYNTHROID 75 MCG tablet Take 1 tablet (75 mcg total) by mouth daily before breakfast. 90 tablet 1   vitamin B-12 (CYANOCOBALAMIN) 1000 MCG tablet Take 1,000 mcg by mouth daily.     No current facility-administered medications for this encounter.    Physical Findings: The patient is in no acute distress. Patient is alert and oriented.  height is 5' 9" (1.753 m) and weight is 196 lb 4 oz (89 kg). Her temporal temperature is 96.7 F (35.9 C) (abnormal). Her blood pressure is 149/102 (abnormal) and her pulse is 86. Her respiration is 18 and oxygen saturation is 100%. .  No significant changes. Lungs are clear to auscultation bilaterally. Heart has regular rate and rhythm. No palpable cervical, supraclavicular, or axillary adenopathy. Abdomen soft, non-tender, normal bowel sounds.  Left breast: no palpable mass, nipple discharge or bleeding.  Right breast shows some hyperpigmentation changes.  Skin is healed well at this time.  No dominant mass appreciated breast nipple discharge or bleeding.  She also has some hyperpigmentation changes in the lateral right supraclavicular region    Lab Findings: Lab Results  Component Value Date   WBC 3.5 (L) 12/10/2020   HGB 12.7 12/10/2020   HCT 36.8 12/10/2020   MCV 92.9 12/10/2020   PLT 199 12/10/2020    Radiographic Findings: No results found.  Impression:  Stage T1c, N1, Right Breast UOQ, Invasive Ductal Carcinoma with high-grade DCIS, ER- / PR- / Her2+, Grade 2   The patient is recovering from the effects of radiation.  No evidence of recurrence on clinical exam today  Plan: As needed follow-up in radiation oncology.  Patient will continue close follow-up with medical oncology as above.    ____________________________________  Blair Promise, PhD,  MD   This document serves as a record of services personally performed by [name], MD. It was created on his behalf by Roney Mans, a trained medical scribe. The creation of this record is based on the scribe's personal observations and the provider's statements to them. This document has been checked and approved by the attending provider.

## 2020-12-16 ENCOUNTER — Encounter: Payer: Self-pay | Admitting: Radiation Oncology

## 2020-12-16 ENCOUNTER — Ambulatory Visit
Admission: RE | Admit: 2020-12-16 | Discharge: 2020-12-16 | Disposition: A | Discharge status: Home / Self Care | Source: Ambulatory Visit | Attending: Radiation Oncology | Admitting: Radiation Oncology

## 2020-12-16 ENCOUNTER — Other Ambulatory Visit: Payer: Self-pay

## 2020-12-16 VITALS — BP 149/102 | HR 86 | Temp 96.7°F | Resp 18 | Ht 69.0 in | Wt 196.2 lb

## 2020-12-16 DIAGNOSIS — Z853 Personal history of malignant neoplasm of breast: Secondary | ICD-10-CM | POA: Diagnosis present

## 2020-12-16 DIAGNOSIS — Z923 Personal history of irradiation: Secondary | ICD-10-CM | POA: Diagnosis not present

## 2020-12-16 DIAGNOSIS — Z79899 Other long term (current) drug therapy: Secondary | ICD-10-CM | POA: Diagnosis not present

## 2020-12-16 DIAGNOSIS — C50411 Malignant neoplasm of upper-outer quadrant of right female breast: Secondary | ICD-10-CM

## 2020-12-16 DIAGNOSIS — U071 COVID-19: Secondary | ICD-10-CM | POA: Insufficient documentation

## 2020-12-16 DIAGNOSIS — Z171 Estrogen receptor negative status [ER-]: Secondary | ICD-10-CM

## 2020-12-16 HISTORY — DX: Personal history of irradiation: Z92.3

## 2020-12-16 NOTE — Progress Notes (Signed)
Shannon Kane is here today for follow up post radiation to the breast.   Breast Side:right   They completed their radiation on: 11/12/2020   Does the patient complain of any of the following: Post radiation skin issues: denies Breast Tenderness: denies Breast Swelling: denies Lymphadema: denies Range of Motion limitations: almost 100% back to baseline Fatigue post radiation: denies fatigue but takes muscles longer to recover after exercise Appetite good/fair/poor: good  Additional comments if applicable: bilateral foot pain/tenderness. Must wear shoes at all times.  Vitals:   12/16/20 1548  BP: (!) 149/102  Pulse: 86  Resp: 18  Temp: (!) 96.7 F (35.9 C)  TempSrc: Temporal  SpO2: 100%  Weight: 196 lb 4 oz (89 kg)  Height: '5\' 9"'$  (1.753 m)

## 2020-12-23 ENCOUNTER — Encounter: Payer: Self-pay | Admitting: *Deleted

## 2020-12-24 ENCOUNTER — Ambulatory Visit

## 2020-12-24 ENCOUNTER — Ambulatory Visit: Admitting: Hematology and Oncology

## 2020-12-24 ENCOUNTER — Other Ambulatory Visit

## 2020-12-31 ENCOUNTER — Other Ambulatory Visit: Payer: Self-pay

## 2020-12-31 ENCOUNTER — Other Ambulatory Visit: Payer: Self-pay | Admitting: *Deleted

## 2020-12-31 ENCOUNTER — Telehealth: Payer: Self-pay | Admitting: *Deleted

## 2020-12-31 ENCOUNTER — Inpatient Hospital Stay: Attending: Hematology and Oncology

## 2020-12-31 VITALS — BP 144/97 | HR 84 | Temp 98.8°F | Resp 14 | Wt 193.8 lb

## 2020-12-31 DIAGNOSIS — Z171 Estrogen receptor negative status [ER-]: Secondary | ICD-10-CM | POA: Insufficient documentation

## 2020-12-31 DIAGNOSIS — C50411 Malignant neoplasm of upper-outer quadrant of right female breast: Secondary | ICD-10-CM | POA: Insufficient documentation

## 2020-12-31 DIAGNOSIS — Z5112 Encounter for antineoplastic immunotherapy: Secondary | ICD-10-CM | POA: Diagnosis present

## 2020-12-31 DIAGNOSIS — Z79899 Other long term (current) drug therapy: Secondary | ICD-10-CM | POA: Insufficient documentation

## 2020-12-31 DIAGNOSIS — Z923 Personal history of irradiation: Secondary | ICD-10-CM | POA: Diagnosis not present

## 2020-12-31 DIAGNOSIS — M25572 Pain in left ankle and joints of left foot: Secondary | ICD-10-CM

## 2020-12-31 MED ORDER — SODIUM CHLORIDE 0.9% FLUSH
10.0000 mL | INTRAVENOUS | Status: DC | PRN
  Administered 2020-12-31: 10 mL

## 2020-12-31 MED ORDER — ACETAMINOPHEN 325 MG PO TABS: 650.0000 mg | ORAL_TABLET | Freq: Once | ORAL | Status: DC

## 2020-12-31 MED ORDER — DIPHENHYDRAMINE HCL 25 MG PO CAPS: 50.0000 mg | ORAL_CAPSULE | Freq: Once | ORAL | Status: DC

## 2020-12-31 MED ORDER — SODIUM CHLORIDE 0.9 % IV SOLN
420.0000 mg | Freq: Once | INTRAVENOUS | Status: AC
  Administered 2020-12-31: 420 mg via INTRAVENOUS
  Filled 2020-12-31: qty 14

## 2020-12-31 MED ORDER — HEPARIN SOD (PORK) LOCK FLUSH 100 UNIT/ML IV SOLN
500.0000 [IU] | Freq: Once | INTRAVENOUS | Status: AC | PRN
  Administered 2020-12-31: 500 [IU]

## 2020-12-31 MED ORDER — SODIUM CHLORIDE 0.9 % IV SOLN: Freq: Once | INTRAVENOUS | Status: AC

## 2020-12-31 MED ORDER — TRASTUZUMAB-DKST CHEMO 150 MG IV SOLR
6.0000 mg/kg | Freq: Once | INTRAVENOUS | Status: AC
  Administered 2020-12-31: 525 mg via INTRAVENOUS
  Filled 2020-12-31: qty 25

## 2020-12-31 NOTE — Progress Notes (Signed)
Per Dr. Lindi Adie, ok to proceed with outdated echo.

## 2020-12-31 NOTE — Patient Instructions (Signed)
[facility] ONCOLOGY  Discharge Instructions: Thank you for choosing [facility] to provide your oncology and hematology care.   If you have a lab appointment with the [facility], please go directly to the [facility] and check in at the registration area.   Wear comfortable clothing and clothing appropriate for easy access to any Portacath or PICC line.   We strive to give you quality time with your provider. You may need to reschedule your appointment if you arrive late (15 or more minutes).  Arriving late affects you and other patients whose appointments are after yours.  Also, if you miss three or more appointments without notifying the office, you may be dismissed from the clinic at the provider's discretion.      For prescription refill requests, have your pharmacy contact our office and allow 72 hours for refills to be completed.    Today you received the following chemotherapy and/or immunotherapy agents trastuzumab/pertuzumab       To help prevent nausea and vomiting after your treatment, we encourage you to take your nausea medication as directed.  BELOW ARE SYMPTOMS THAT SHOULD BE REPORTED IMMEDIATELY: *FEVER GREATER THAN 100.4 F (38 C) OR HIGHER *CHILLS OR SWEATING *NAUSEA AND VOMITING THAT IS NOT CONTROLLED WITH YOUR NAUSEA MEDICATION *UNUSUAL SHORTNESS OF BREATH *UNUSUAL BRUISING OR BLEEDING *URINARY PROBLEMS (pain or burning when urinating, or frequent urination) *BOWEL PROBLEMS (unusual diarrhea, constipation, pain near the anus) TENDERNESS IN MOUTH AND THROAT WITH OR WITHOUT PRESENCE OF ULCERS (sore throat, sores in mouth, or a toothache) UNUSUAL RASH, SWELLING OR PAIN  UNUSUAL VAGINAL DISCHARGE OR ITCHING   Items with * indicate a potential emergency and should be followed up as soon as possible or go to the Emergency Department if any problems should occur.  Please show the CHEMOTHERAPY ALERT CARD or IMMUNOTHERAPY ALERT CARD at  check-in to the Emergency Department and triage nurse.  Should you have questions after your visit or need to cancel or reschedule your appointment, please contact [facility]  Dept: [phone]  and follow the prompts.  Office hours are 8:00 a.m. to 4:30 p.m. Monday - Friday. Please note that voicemails left after 4:00 p.m. may not be returned until the following business day.  We are closed weekends and major holidays. You have access to a nurse at all times for urgent questions. Please call the main number to the clinic Dept: [phone] and follow the prompts.   For any non-urgent questions, you may also contact your provider using MyChart. We now offer e-Visits for anyone 15 and older to request care online for non-urgent symptoms. For details visit [email or web address].   Also download the MyChart app! Go to the app store, search "MyChart", open the app, select [facility], and log in with your MyChart username and password.  Due to Covid, a mask is required upon entering the hospital/clinic. If you do not have a mask, one will be given to you upon arrival. For doctor visits, patients may have 1 support person aged 48 or older with them. For treatment visits, patients cannot have anyone with them due to current Covid guidelines and our immunocompromised population.

## 2020-12-31 NOTE — Telephone Encounter (Signed)
This RN spoke with pt per her request regarding " why am I not scheduled for lab and I thought I was going to be seen today ?"  This RN informed her per MD - labs are stable - and not requested per treatment today- visit was rescheduled to next visit.  This RN inquired if she was having any current issues.  Carlina states main issue is " cramps in the mid region of my back that when I reach up - catches and causes spasms"   She also notes joint pain in bilateral knees.  Neither discomfort interferes with overall ADL's nor does it wake her at night.  She is wondering if this is a side effect of the treatment .  She denies diarrhea " just 8 ot 9 times I go to the bathroom I am passing stool " She states stool is formed.  This RN discussed above not a direct side effect of the drugs she is receiving  but more of a cumulation of the therapy.  Discussed use of tylenol and ibuprofen for discomfort.  Per increased in BM- she would prefer to " keep things as is " and not try to lessen " just keep things moving "  Note she also twisted her right ankle with noted swelling . She is able to bear weight.  The above will be given to MD for review upon his return to the office for any further recommendations.  Pt is scheduled for MD visit with next treatment.

## 2021-01-03 ENCOUNTER — Other Ambulatory Visit: Payer: Self-pay | Admitting: Hematology and Oncology

## 2021-01-03 DIAGNOSIS — Z780 Asymptomatic menopausal state: Secondary | ICD-10-CM

## 2021-01-07 ENCOUNTER — Ambulatory Visit
Admission: RE | Admit: 2021-01-07 | Discharge: 2021-01-07 | Disposition: A | Discharge status: Home / Self Care | Source: Ambulatory Visit | Attending: Hematology and Oncology | Admitting: Hematology and Oncology

## 2021-01-07 ENCOUNTER — Other Ambulatory Visit: Payer: Self-pay

## 2021-01-07 DIAGNOSIS — Z780 Asymptomatic menopausal state: Secondary | ICD-10-CM

## 2021-01-08 ENCOUNTER — Encounter: Payer: Self-pay | Admitting: Internal Medicine

## 2021-01-08 ENCOUNTER — Encounter: Payer: Self-pay | Admitting: Hematology and Oncology

## 2021-01-08 ENCOUNTER — Ambulatory Visit (INDEPENDENT_AMBULATORY_CARE_PROVIDER_SITE_OTHER): Admitting: Internal Medicine

## 2021-01-08 VITALS — BP 152/94 | HR 93 | Temp 98.2°F | Ht 68.6 in | Wt 194.4 lb

## 2021-01-08 DIAGNOSIS — C50411 Malignant neoplasm of upper-outer quadrant of right female breast: Secondary | ICD-10-CM

## 2021-01-08 DIAGNOSIS — I10 Essential (primary) hypertension: Secondary | ICD-10-CM

## 2021-01-08 DIAGNOSIS — E039 Hypothyroidism, unspecified: Secondary | ICD-10-CM

## 2021-01-08 DIAGNOSIS — K625 Hemorrhage of anus and rectum: Secondary | ICD-10-CM | POA: Diagnosis not present

## 2021-01-08 DIAGNOSIS — R7309 Other abnormal glucose: Secondary | ICD-10-CM

## 2021-01-08 DIAGNOSIS — E663 Overweight: Secondary | ICD-10-CM

## 2021-01-08 DIAGNOSIS — Z8616 Personal history of COVID-19: Secondary | ICD-10-CM

## 2021-01-08 DIAGNOSIS — Z171 Estrogen receptor negative status [ER-]: Secondary | ICD-10-CM

## 2021-01-08 MED ORDER — VALSARTAN 160 MG PO TABS: 160.0000 mg | ORAL_TABLET | Freq: Every day | ORAL | 11 refills | Status: DC

## 2021-01-08 NOTE — Patient Instructions (Signed)

## 2021-01-08 NOTE — Progress Notes (Signed)
I,Tianna Badgett,acting as a Education administrator for [name], MD.,have documented all relevant documentation on the behalf of [name], MD,as directed by  [name], MD while in the presence of [name], MD.  This visit occurred during the SARS-CoV-2 public health emergency.  Safety protocols were in place, including screening questions prior to the visit, additional usage of staff PPE, and extensive cleaning of exam room while observing appropriate contact time as indicated for disinfecting solutions.  Subjective:     Patient ID: Shannon Kane , female    DOB: 1971-02-17 , 50 y.o.   MRN: [id number]   Chief Complaint  Patient presents with   Hypertension    HPI  The patient is here today for a follow-up for her blood pressure.  She reports compliance with meds. She denies having headaches, chest pain and shortness of breath.   Hypertension This is a chronic problem. The current episode started more than 1 year ago. The problem has been gradually improving since onset. The problem is controlled. Pertinent negatives include no blurred vision, chest pain, orthopnea, palpitations or shortness of breath. Past treatments include ACE inhibitors. The current treatment provides moderate improvement. Identifiable causes of hypertension include a thyroid problem.  Thyroid Problem Presents for follow-up visit. Patient reports no cold intolerance, leg swelling, palpitations or weight gain. The symptoms have been stable.    Past Medical History:  Diagnosis Date   Abnormal glucose    Breast cancer ([place]) 03/2020   right breast IDC   Family history of breast cancer 04/03/2020   Family history of ovarian cancer 04/03/2020   History of radiation therapy    Right breast- 09/25/20-11/12/20- Dr. [name]   Hypertension    Hyperthyroidism    in the past   Hypothyroidism    Iron deficiency anemia    Palpitation    Spontaneous ecchymoses    Tachycardia    Vitamin D deficiency       Family History  Problem Relation Age of Onset   Diabetes Mother    Hypertension Mother    Leukemia Father        dx > 29   Ovarian cancer Maternal Grandmother 63   Bone cancer Paternal Grandfather        dx unknown age   Cancer Maternal Uncle        Mouth and throad; dx > 45   Breast cancer Other        MGM's niece, dx unknown age   Cancer Maternal Uncle        unknown type; dx > 50     Current Outpatient Medications:    Cholecalciferol (VITAMIN D-3 PO), Take 6,000 Units by mouth daily. , Disp: , Rfl:    lidocaine-prilocaine (EMLA) cream, Apply 1 application topically as needed., Disp: , Rfl:    MAGNESIUM CITRATE PO, Take 500 mcg by mouth at bedtime as needed (sleep). Only when having trouble sleeping, Disp: , Rfl:    SYNTHROID 75 MCG tablet, Take 1 tablet (75 mcg total) by mouth daily before breakfast., Disp: 90 tablet, Rfl: 1   valsartan (DIOVAN) 160 MG tablet, Take 1 tablet (160 mg total) by mouth daily., Disp: 30 tablet, Rfl: 11   vitamin B-12 (CYANOCOBALAMIN) 1000 MCG tablet, Take 1,000 mcg by mouth daily., Disp: , Rfl:    Allergies  Allergen Reactions   Amoxicillin     Tolerates ANCEF   Ciprofloxacin Itching   Penicillins Hives    Tolerates ANCEF  Review of Systems  Constitutional: Negative.  Negative for weight gain.  Eyes:  Negative for blurred vision.  Respiratory: Negative.  Negative for shortness of breath.   Cardiovascular: Negative.  Negative for chest pain, palpitations and orthopnea.  Gastrointestinal:  Positive for blood in stool.       She noticed blood on her toilet paper on Monday. It only occurred once. She admits her stool was hard at that time. No further episodes.   Endocrine: Negative for cold intolerance.  Neurological: Negative.     Today's Vitals   01/08/21 1414  BP: (!) 152/94  Pulse: 93  Temp: 98.2 F (36.8 C)  TempSrc: Oral  Weight: 194 lb 6.4 oz (88.2 kg)  Height: 5' 8.6" (1.742 m)   Body mass index is 29.04 kg/m.  Wt  Readings from Last 3 Encounters:  01/08/21 194 lb 6.4 oz (88.2 kg)  12/31/20 193 lb 12 oz (87.9 kg)  12/16/20 196 lb 4 oz (89 kg)   BP Readings from Last 3 Encounters:  01/08/21 (!) 152/94  12/31/20 (!) 144/97  12/16/20 (!) 149/102    Objective:  Physical Exam Vitals and nursing note reviewed.  Constitutional:      Appearance: Normal appearance.  HENT:     Head: Normocephalic and atraumatic.  Cardiovascular:     Rate and Rhythm: Normal rate and regular rhythm.     Heart sounds: Normal heart sounds.  Pulmonary:     Effort: Pulmonary effort is normal.     Breath sounds: Normal breath sounds.  Musculoskeletal:     Cervical back: Normal range of motion.  Skin:    General: Skin is warm.  Neurological:     General: No focal deficit present.     Mental Status: She is alert.  Psychiatric:        Mood and Affect: Mood normal.        Behavior: Behavior normal.        Assessment And Plan:     1. Essential hypertension, benign Comments: Chronic, uncontrolled - pt has stopped meds. I will d/c lisinopril and start valsartan '160mg'$  nightly. She will rto in 2 weeks for a nurse visit and 6 wks to see me.   2. Primary hypothyroidism Comments: I will check thyroid panel and adjust meds as needed.  - Thyroid Panel With TSH  3. BRBPR (bright red blood per rectum) Comments: Pt advised to let me know if this occurs again. Possibly due to constipation. If recurrent, I will refer her to GI for further evaluation.   4. Other abnormal glucose Comments: I will check an a1c/insulin level today. Encouraged to limit her intake of sweetened beverages.  - Hemoglobin A1c - Insulin, random(561)  5. Malignant neoplasm of upper-outer quadrant of right breast in female, estrogen receptor negative ([place]) As per Oncology. I appreciate their input.   6. Overweight (BMI 25.0-29.9) She is encouraged to aim for at least 150 minutes of exercise per week.   7. Personal history of COVID-19 Comments:  States she had COVID July 2022. She is fully vaccinated, boosted x 1.   Patient was given opportunity to ask questions. Patient verbalized understanding of the plan and was able to repeat key elements of the plan. All questions were answered to their satisfaction.   I, [name], MD, have reviewed all documentation for this visit. The documentation on 01/08/21 for the exam, diagnosis, procedures, and orders are all accurate and complete.   IF YOU HAVE BEEN REFERRED TO A  SPECIALIST, IT MAY TAKE 1-2 WEEKS TO SCHEDULE/PROCESS THE REFERRAL. IF YOU HAVE NOT HEARD FROM US/SPECIALIST IN TWO WEEKS, PLEASE GIVE [place] A CALL AT [phone] X [phone].   THE PATIENT IS ENCOURAGED TO PRACTICE SOCIAL DISTANCING DUE TO THE COVID-19 PANDEMIC.

## 2021-01-09 LAB — HEMOGLOBIN A1C
Est. average glucose Bld gHb Est-mCnc: 123 mg/dL
Hgb A1c MFr Bld: 5.9 % — ABNORMAL HIGH (ref 4.8–5.6)

## 2021-01-09 LAB — THYROID PANEL WITH TSH
Free Thyroxine Index: 2.3 (ref 1.2–4.9)
T3 Uptake Ratio: 26 % (ref 24–39)
T4, Total: 8.7 ug/dL (ref 4.5–12.0)
TSH: 2.63 u[IU]/mL (ref 0.450–4.500)

## 2021-01-09 LAB — INSULIN, RANDOM: INSULIN: 16.3 u[IU]/mL (ref 2.6–24.9)

## 2021-01-14 ENCOUNTER — Ambulatory Visit: Admitting: Hematology and Oncology

## 2021-01-14 ENCOUNTER — Ambulatory Visit

## 2021-01-14 ENCOUNTER — Other Ambulatory Visit

## 2021-01-19 NOTE — Progress Notes (Signed)
Patient Care Team: [name], MD as PCP - General (Internal Medicine) Rockwell Germany, RN as Oncology Nurse Navigator Mauro Kaufmann, RN as Oncology Nurse Navigator Coralie Keens, MD as Consulting Physician (General Surgery) Nicholas Lose, MD as Consulting Physician (Hematology and Oncology) [name], MD as Consulting Physician (Radiation Oncology)  DIAGNOSIS:    ICD-10-CM   1. Malignant neoplasm of upper-outer quadrant of right breast in female, estrogen receptor negative ([place])  C50.411 ECHOCARDIOGRAM COMPLETE   Z17.1       SUMMARY OF ONCOLOGIC HISTORY: Oncology History  Malignant neoplasm of upper-outer quadrant of right breast in female, estrogen receptor negative ([place])  03/28/2020 Initial Diagnosis   Patient palpated a right breast mass x2 wks. Mammogram and US showed a 2.0cm mass at the 10 o'clock position in the right breast and two enlarged right axillary lymph nodes, 2.0cm and 1.7cm. 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   04/16/2020 - 07/30/2020 Chemotherapy   TCHP X 6 followed by HP maintenance: Path CR        04/18/2020 Genetic Testing   Negative genetic testing: no pathogenic variants detected in [facility] Multi-Cancer Panel.  Variants of uncertain significance detected in CDH1 at c.1289T>G (p.Val430Gly) and EGFR at c.2873G>A (p.Arg958His).  The report date is April 18, 2020.   The Multi-Cancer Panel offered by [facility] includes sequencing and/or deletion duplication testing of the following 85 genes: AIP, ALK, APC, ATM, AXIN2,BAP1,  BARD1, BLM, BMPR1A, BRCA1, BRCA2, BRIP1, CASR, CDC73, CDH1, CDK4, CDKN1B, CDKN1C, CDKN2A (p14ARF), CDKN2A (p16INK4a), CEBPA, CHEK2, CTNNA1, DICER1, DIS3L2, EGFR, EPCAM (Deletion/duplication testing only), FH, FLCN, GATA2, GPC3, GREM1 (Promoter region deletion/duplication testing only), HOXB13 (c.251G>A, p.Gly84Glu), HRAS, KIT, MAX, MEN1, MET, MITF (c.952G>A,  p.Glu318Lys variant only), MLH1, MSH2, MSH3, MSH6, MUTYH, NBN, NF1, NF2, NTHL1, PALB2, PDGFRA, PHOX2B, PMS2, POLD1, POLE, POT1, PRKAR1A, PTCH1, PTEN, RAD50, RAD51C, RAD51D, RB1, RECQL4, RET, RNF43, RUNX1, SDHAF2, SDHA (sequence changes only), SDHB, SDHC, SDHD, SMAD4, SMARCA4, SMARCB1, SMARCE1, STK11, SUFU, TERC, TERT, TMEM127, TP53, TSC1, TSC2, VHL, WRN and WT1.    08/20/2020 Surgery   Right lumpectomy [name]): no residual carcinoma, 7 right axillary lymph nodes negative for carcinoma.   09/10/2020 -  Chemotherapy      Patient is on Antibody Plan: BREAST TRASTUZUMAB + PERTUZUMAB Q21D     09/26/2020 - 11/12/2020 Radiation Therapy   Adjuvant radiation     CHIEF COMPLIANT: Cycle 7 Herceptin Perjeta   INTERVAL HISTORY: Shannon Kane is a 50 y.o. with above-mentioned history of right breast cancer treated with lumpectomy, radiation, and neoadjuvant chemotherapy with [place], currently on Herceptin Perjeta maintenance. She presents to the clinic today for treatment.  She is tolerating Herceptin Perjeta fairly well.  Nails are coming back.  ALLERGIES:  is allergic to amoxicillin, ciprofloxacin, and penicillins.  MEDICATIONS:  Current Outpatient Medications  Medication Sig Dispense Refill   Cholecalciferol (VITAMIN D-3 PO) Take 6,000 Units by mouth daily.      lidocaine-prilocaine (EMLA) cream Apply 1 application topically as needed.     MAGNESIUM CITRATE PO Take 500 mcg by mouth at bedtime as needed (sleep). Only when having trouble sleeping     SYNTHROID 75 MCG tablet Take 1 tablet (75 mcg total) by mouth daily before breakfast. 90 tablet 1   valsartan (DIOVAN) 160 MG tablet Take 1 tablet (160 mg total) by mouth daily. 30 tablet 11   vitamin B-12 (CYANOCOBALAMIN) 1000 MCG tablet Take 1,000 mcg  by mouth daily.     No current facility-administered medications for this visit.    PHYSICAL EXAMINATION: ECOG PERFORMANCE STATUS: 1 - Symptomatic but completely ambulatory  Vitals:   01/21/21  0958 01/21/21 0959  BP: (!) 160/102 (!) 148/105  Pulse: 86   Resp: 18   Temp: (!) 97.2 F (36.2 C)   SpO2: 99%    Filed Weights   01/21/21 0958  Weight: 193 lb 14.4 oz (88 kg)      LABORATORY DATA:  I have reviewed the data as listed CMP Latest Ref Rng & Units 12/10/2020 11/12/2020 10/22/2020  Glucose 70 - 99 mg/dL 88 89 113(H)  BUN 6 - 20 mg/dL 10 10 11   Creatinine 0.44 - 1.00 mg/dL 0.83 0.77 0.83  Sodium 135 - 145 mmol/L 142 140 142  Potassium 3.5 - 5.1 mmol/L 3.7 3.8 3.5  Chloride 98 - 111 mmol/L 107 107 105  CO2 22 - 32 mmol/L 26 25 26   Calcium 8.9 - 10.3 mg/dL 9.5 8.9 9.2  Total Protein 6.5 - 8.1 g/dL 7.8 7.6 7.5  Total Bilirubin 0.3 - 1.2 mg/dL 0.6 0.5 0.5  Alkaline Phos 38 - 126 U/L 87 91 94  AST 15 - 41 U/L 32 21 20  ALT 0 - 44 U/L 22 16 13     Lab Results  Component Value Date   WBC 3.5 (L) 12/10/2020   HGB 12.7 12/10/2020   HCT 36.8 12/10/2020   MCV 92.9 12/10/2020   PLT 199 12/10/2020   NEUTROABS 2.2 12/10/2020    ASSESSMENT & PLAN:  Malignant neoplasm of upper-outer quadrant of right breast in female, estrogen receptor negative ([place]) 03/28/2020:Patient palpated a right breast mass x2 wks. Mammogram and US showed a 2.0cm mass at the 10 o'clock position in the right breast and two enlarged right axillary lymph nodes, 2.0cm and 1.7cm. 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 Satellite lesion biopsy: DCIS ER 0%, PR 0%   Treatment plan: 1. Neoadjuvant chemotherapy with TCH Perjeta 6 cycles followed by Herceptin Perjeta versus Kadcyla maintenance for 1 year 2. 08/20/2020:Right lumpectomy [name]): no residual carcinoma, 7 right axillary lymph nodes negative for carcinoma. 3. Followed by adjuvant radiation therapy if patient had lumpectomy ----------------------------------------------------------------------------------------------------------------------------------------- Current treatment:  Completed 6 cycles of [place], continue with Herceptin Perjeta maintenance (to be completed 04/08/2021) Echocardiogram 09/17/2020: EF 60 to 65%   Severe leg pains: Possibly due to plantar fasciitis. Nail changes: She has lost one of her toenails and she thinks she may lose several of her fingers nails as well as other toenails as well.   Adjuvant radiation: 09/26/2020-11/12/2020   Herceptin and Perjeta toxicities: Denies any diarrhea.  Does complain of fatigue. Letrozole counseling: I discussed with her extensively about risks and benefits of letrozole.  She is not keen on taking the medication I instructed her to take half a tablet daily for 2 weeks and then increase to 1 tablet daily if she is tolerating it well.  I sent a prescription for 30 tablets.  If she is tolerating it well we will send a 90-day supply to [facility].   Return to clinic every 3 weeks for Herceptin Perjeta and every 9 weeks with follow-up with me.      Orders Placed This Encounter  Procedures   ECHOCARDIOGRAM COMPLETE    Standing Status:   Future    Standing Expiration Date:   01/17/2022    Order  Specific Question:   Where should this test be performed    Answer:   [facility]    Order Specific Question:   Perflutren DEFINITY (image enhancing agent) should be administered unless hypersensitivity or allergy exist    Answer:   Administer Perflutren    Order Specific Question:   Is a special reader required? (athlete or structural heart)    Answer:   No    Order Specific Question:   Does this study need to be read by the Structural team/Level 3 readers?    Answer:   No    Order Specific Question:   Reason for exam-Echo    Answer:   Chemo  Z09    Order Specific Question:   Other Comments    Answer:   On HER2 directed therapy, last ECHO 09/17/20   The patient has a good understanding of the overall plan. she agrees with it. she will call with any problems that may develop before the next visit here.  Total time  spent: 30 mins including face to face time and time spent for planning, charting and coordination of care  Rulon Eisenmenger, MD, MPH 01/21/2021  I, Thana Ates, am acting as scribe for Dr. Nicholas Lose.  I have reviewed the above documentation for accuracy and completeness, and I agree with the above.

## 2021-01-21 ENCOUNTER — Inpatient Hospital Stay: Attending: Hematology and Oncology

## 2021-01-21 ENCOUNTER — Other Ambulatory Visit: Payer: Self-pay

## 2021-01-21 ENCOUNTER — Inpatient Hospital Stay (HOSPITAL_BASED_OUTPATIENT_CLINIC_OR_DEPARTMENT_OTHER): Admitting: Hematology and Oncology

## 2021-01-21 ENCOUNTER — Inpatient Hospital Stay

## 2021-01-21 VITALS — BP 144/95 | HR 82

## 2021-01-21 VITALS — BP 148/105 | HR 86 | Temp 97.2°F | Resp 18 | Ht 68.6 in | Wt 193.9 lb

## 2021-01-21 DIAGNOSIS — Z171 Estrogen receptor negative status [ER-]: Secondary | ICD-10-CM | POA: Insufficient documentation

## 2021-01-21 DIAGNOSIS — C50411 Malignant neoplasm of upper-outer quadrant of right female breast: Secondary | ICD-10-CM

## 2021-01-21 DIAGNOSIS — Z79899 Other long term (current) drug therapy: Secondary | ICD-10-CM | POA: Insufficient documentation

## 2021-01-21 DIAGNOSIS — Z5112 Encounter for antineoplastic immunotherapy: Secondary | ICD-10-CM | POA: Insufficient documentation

## 2021-01-21 DIAGNOSIS — Z9221 Personal history of antineoplastic chemotherapy: Secondary | ICD-10-CM | POA: Insufficient documentation

## 2021-01-21 DIAGNOSIS — Z923 Personal history of irradiation: Secondary | ICD-10-CM | POA: Diagnosis not present

## 2021-01-21 DIAGNOSIS — Z95828 Presence of other vascular implants and grafts: Secondary | ICD-10-CM

## 2021-01-21 LAB — CMP (CANCER CENTER ONLY)
ALT: 25 U/L (ref 0–44)
AST: 25 U/L (ref 15–41)
Albumin: 3.7 g/dL (ref 3.5–5.0)
Alkaline Phosphatase: 105 U/L (ref 38–126)
Anion gap: 9 (ref 5–15)
BUN: 11 mg/dL (ref 6–20)
CO2: 25 mmol/L (ref 22–32)
Calcium: 9.1 mg/dL (ref 8.9–10.3)
Chloride: 108 mmol/L (ref 98–111)
Creatinine: 0.85 mg/dL (ref 0.44–1.00)
GFR, Estimated: >60 mL/min (ref >60)
Glucose, Bld: 98 mg/dL (ref 70–99)
Potassium: 3.7 mmol/L (ref 3.5–5.1)
Sodium: 142 mmol/L (ref 135–145)
Total Bilirubin: 0.4 mg/dL (ref 0.3–1.2)
Total Protein: 7.3 g/dL (ref 6.5–8.1)

## 2021-01-21 LAB — CBC WITH DIFFERENTIAL (CANCER CENTER ONLY)
Abs Immature Granulocytes: 0.01 10*3/uL (ref 0.00–0.07)
Basophils Absolute: 0 10*3/uL (ref 0.0–0.1)
Basophils Relative: 1 %
Eosinophils Absolute: 0.1 10*3/uL (ref 0.0–0.5)
Eosinophils Relative: 2 %
HCT: 38 % (ref 36.0–46.0)
Hemoglobin: 12.7 g/dL (ref 12.0–15.0)
Immature Granulocytes: 0 %
Lymphocytes Relative: 18 %
Lymphs Abs: 0.7 10*3/uL (ref 0.7–4.0)
MCH: 31.3 pg (ref 26.0–34.0)
MCHC: 33.4 g/dL (ref 30.0–36.0)
MCV: 93.6 fL (ref 80.0–100.0)
Monocytes Absolute: 0.4 10*3/uL (ref 0.1–1.0)
Monocytes Relative: 11 %
Neutro Abs: 2.5 10*3/uL (ref 1.7–7.7)
Neutrophils Relative %: 68 %
Platelet Count: 204 10*3/uL (ref 150–400)
RBC: 4.06 MIL/uL (ref 3.87–5.11)
RDW: 13.6 % (ref 11.5–15.5)
WBC Count: 3.7 10*3/uL — ABNORMAL LOW (ref 4.0–10.5)
nRBC: 0 % (ref 0.0–0.2)

## 2021-01-21 MED ORDER — SODIUM CHLORIDE 0.9 % IV SOLN
Freq: Once | INTRAVENOUS | Status: AC
Start: 2021-01-21 — End: 2021-01-21

## 2021-01-21 MED ORDER — HEPARIN SOD (PORK) LOCK FLUSH 100 UNIT/ML IV SOLN
500.0000 [IU] | Freq: Once | INTRAVENOUS | Status: AC | PRN
  Administered 2021-01-21: 500 [IU]

## 2021-01-21 MED ORDER — SODIUM CHLORIDE 0.9 % IV SOLN
420.0000 mg | Freq: Once | INTRAVENOUS | Status: AC
  Administered 2021-01-21: 420 mg via INTRAVENOUS
  Filled 2021-01-21: qty 14

## 2021-01-21 MED ORDER — DIPHENHYDRAMINE HCL 25 MG PO CAPS: 50.0000 mg | ORAL_CAPSULE | Freq: Once | ORAL | Status: DC

## 2021-01-21 MED ORDER — SODIUM CHLORIDE 0.9% FLUSH
10.0000 mL | INTRAVENOUS | Status: DC | PRN
  Administered 2021-01-21: 10 mL

## 2021-01-21 MED ORDER — ACETAMINOPHEN 325 MG PO TABS: 650.0000 mg | ORAL_TABLET | Freq: Once | ORAL | Status: DC

## 2021-01-21 MED ORDER — SODIUM CHLORIDE 0.9% FLUSH
10.0000 mL | Freq: Once | INTRAVENOUS | Status: AC
  Administered 2021-01-21: 10 mL

## 2021-01-21 MED ORDER — TRASTUZUMAB-DKST CHEMO 150 MG IV SOLR
6.0000 mg/kg | Freq: Once | INTRAVENOUS | Status: AC
  Administered 2021-01-21: 525 mg via INTRAVENOUS
  Filled 2021-01-21: qty 25

## 2021-01-21 MED ORDER — LETROZOLE 2.5 MG PO TABS: 2.5000 mg | ORAL_TABLET | Freq: Every day | ORAL | 0 refills | Status: DC

## 2021-01-21 NOTE — Patient Instructions (Signed)
[facility]COL[facility]  Discharge Instructions: Thank you for choosing [facility] to provide your oncology and hematology care.   If you have a lab appointment with the [facility], please go directly to the [facility] and check in at the registration area.   Wear comfortable clothing and clothing appropriate for easy access to any Portacath or PICC line.   We strive to give you quality time with your provider. You may need to reschedule your appointment if you arrive late (15 or more minutes).  Arriving late affects you and other patients whose appointments are after yours.  Also, if you miss three or more appointments without notifying the office, you may be dismissed from the clinic at the provider's discretion.      For prescription refill requests, have your pharmacy contact our office and allow 72 hours for refills to be completed.    Today you received the following chemotherapy and/or immunotherapy agents : Perjeta, Herceptin      To help prevent nausea and vomiting after your treatment, we encourage you to take your nausea medication as directed.  BELOW ARE SYMPTOMS THAT SHOULD BE REPORTED IMMEDIATELY: *FEVER GREATER THAN 100.4 F (38 C) OR HIGHER *CHILLS OR SWEATING *NAUSEA AND VOMITING THAT IS NOT CONTROLLED WITH YOUR NAUSEA MEDICATION *UNUSUAL SHORTNESS OF BREATH *UNUSUAL BRUISING OR BLEEDING *URINARY PROBLEMS (pain or burning when urinating, or frequent urination) *BOWEL PROBLEMS (unusual diarrhea, constipation, pain near the anus) TENDERNESS IN MOUTH AND THROAT WITH OR WITHOUT PRESENCE OF ULCERS (sore throat, sores in mouth, or a toothache) UNUSUAL RASH, SWELLING OR PAIN  UNUSUAL VAGINAL DISCHARGE OR ITCHING   Items with * indicate a potential emergency and should be followed up as soon as possible or go to the Emergency Department if any problems should occur.  Please show the CHEMOTHERAPY ALERT CARD or IMMUNOTHERAPY ALERT CARD at  check-in to the Emergency Department and triage nurse.  Should you have questions after your visit or need to cancel or reschedule your appointment, please contact [facility]  Dept: [phone]  and follow the prompts.  Office hours are 8:00 a.m. to 4:30 p.m. Monday - Friday. Please note that voicemails left after 4:00 p.m. may not be returned until the following business day.  We are closed weekends and major holidays. You have access to a nurse at all times for urgent questions. Please call the main number to the clinic Dept: [phone] and follow the prompts.   For any non-urgent questions, you may also contact your provider using MyChart. We now offer e-Visits for anyone 31 and older to request care online for non-urgent symptoms. For details visit [email or web address].   Also download the MyChart app! Go to the app store, search "MyChart", open the app, select [facility], and log in with your MyChart username and password.  Due to Covid, a mask is required upon entering the hospital/clinic. If you do not have a mask, one will be given to you upon arrival. For doctor visits, patients may have 1 support person aged 80 or older with them. For treatment visits, patients cannot have anyone with them due to current Covid guidelines and our immunocompromised population.

## 2021-01-21 NOTE — Assessment & Plan Note (Signed)
03/28/2020:Patient palpated a right breast mass x2 wks. Mammogram and US showed a 2.0cm mass at the 10 o'clock position in the right breast and two enlarged right axillary lymph nodes, 2.0cm and 1.7cm. 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 Satellite lesion biopsy: DCIS ER 0%, PR 0%  Treatment plan: 1. Neoadjuvant chemotherapy with [place] Perjeta 6 cycles followed by HerceptinPerjeta versus Kadcylamaintenance for 1 year 2.08/20/2020:Right lumpectomy [name]): no residual carcinoma, 7 right axillary lymph nodes negative for carcinoma. 3. Followed by adjuvant radiation therapy if patient had lumpectomy ----------------------------------------------------------------------------------------------------------------------------------------- Current treatment:Completed 6 cycles of [place], continue with Herceptin Perjeta maintenance(to be completed 04/08/2021) Echocardiogram5/07/2020: EF 60 to 65%  Severe leg pains: Possibly due to plantar fasciitis. Nail changes: She has lost one of her toenails and she thinks she may lose several of her fingers nails as well as other toenails as well.  Adjuvant radiation:09/26/2020-11/12/2020  Herceptin and Perjeta toxicities: Denies any diarrhea.  Does complain of fatigue. We discussed the role of antiestrogen therapy.  She is not very keen on taking any further medications.    Return to clinic every 3 weeks for Herceptin Perjeta and every 9 weeks with follow-up with me.

## 2021-01-22 ENCOUNTER — Telehealth: Payer: Self-pay | Admitting: Hematology and Oncology

## 2021-01-22 NOTE — Telephone Encounter (Signed)
Scheduled per 9/6 los. Called pt and left a msg

## 2021-01-23 ENCOUNTER — Encounter: Payer: Self-pay | Admitting: *Deleted

## 2021-01-28 ENCOUNTER — Ambulatory Visit

## 2021-01-28 ENCOUNTER — Other Ambulatory Visit: Payer: Self-pay

## 2021-01-28 VITALS — BP 130/92 | HR 82 | Temp 98.4°F | Ht 68.6 in

## 2021-01-28 DIAGNOSIS — I10 Essential (primary) hypertension: Secondary | ICD-10-CM

## 2021-01-28 NOTE — Progress Notes (Signed)
The patient was told to check her bp at home 3 times per week, send the weekly readings and the readings from her next appointment in a couple weeks.

## 2021-02-03 ENCOUNTER — Encounter: Payer: Self-pay | Admitting: Internal Medicine

## 2021-02-03 ENCOUNTER — Other Ambulatory Visit: Payer: Self-pay | Admitting: Internal Medicine

## 2021-02-03 MED ORDER — VALSARTAN 160 MG PO TABS: 160.0000 mg | ORAL_TABLET | Freq: Every day | ORAL | 2 refills | Status: DC

## 2021-02-04 ENCOUNTER — Ambulatory Visit (HOSPITAL_COMMUNITY)
Admission: RE | Admit: 2021-02-04 | Discharge: 2021-02-04 | Disposition: A | Discharge status: Home / Self Care | Source: Ambulatory Visit | Attending: Hematology and Oncology | Admitting: Hematology and Oncology

## 2021-02-04 ENCOUNTER — Other Ambulatory Visit: Payer: Self-pay

## 2021-02-04 DIAGNOSIS — Z0189 Encounter for other specified special examinations: Secondary | ICD-10-CM

## 2021-02-04 DIAGNOSIS — Z01818 Encounter for other preprocedural examination: Secondary | ICD-10-CM | POA: Insufficient documentation

## 2021-02-04 DIAGNOSIS — C50411 Malignant neoplasm of upper-outer quadrant of right female breast: Secondary | ICD-10-CM | POA: Insufficient documentation

## 2021-02-04 DIAGNOSIS — Z171 Estrogen receptor negative status [ER-]: Secondary | ICD-10-CM

## 2021-02-04 DIAGNOSIS — I10 Essential (primary) hypertension: Secondary | ICD-10-CM | POA: Diagnosis not present

## 2021-02-11 ENCOUNTER — Other Ambulatory Visit: Payer: Self-pay | Admitting: Hematology and Oncology

## 2021-02-11 ENCOUNTER — Encounter: Payer: Self-pay | Admitting: Internal Medicine

## 2021-02-11 ENCOUNTER — Inpatient Hospital Stay

## 2021-02-11 ENCOUNTER — Other Ambulatory Visit: Payer: Self-pay

## 2021-02-11 VITALS — BP 154/96 | HR 70 | Temp 98.5°F | Resp 17

## 2021-02-11 DIAGNOSIS — Z5112 Encounter for antineoplastic immunotherapy: Secondary | ICD-10-CM | POA: Diagnosis not present

## 2021-02-11 DIAGNOSIS — Z171 Estrogen receptor negative status [ER-]: Secondary | ICD-10-CM

## 2021-02-11 DIAGNOSIS — C50411 Malignant neoplasm of upper-outer quadrant of right female breast: Secondary | ICD-10-CM

## 2021-02-11 MED ORDER — SODIUM CHLORIDE 0.9 % IV SOLN: Freq: Once | INTRAVENOUS | Status: AC

## 2021-02-11 MED ORDER — SODIUM CHLORIDE 0.9 % IV SOLN
420.0000 mg | Freq: Once | INTRAVENOUS | Status: AC
  Administered 2021-02-11: 420 mg via INTRAVENOUS
  Filled 2021-02-11: qty 14

## 2021-02-11 MED ORDER — HEPARIN SOD (PORK) LOCK FLUSH 100 UNIT/ML IV SOLN
500.0000 [IU] | Freq: Once | INTRAVENOUS | Status: AC | PRN
  Administered 2021-02-11: 500 [IU]

## 2021-02-11 MED ORDER — ACETAMINOPHEN 325 MG PO TABS
650.0000 mg | ORAL_TABLET | Freq: Once | ORAL | Status: AC
  Administered 2021-02-11: 650 mg via ORAL
  Filled 2021-02-11: qty 2

## 2021-02-11 MED ORDER — TRASTUZUMAB-DKST CHEMO 150 MG IV SOLR
6.0000 mg/kg | Freq: Once | INTRAVENOUS | Status: AC
  Administered 2021-02-11: 525 mg via INTRAVENOUS
  Filled 2021-02-11: qty 25

## 2021-02-11 MED ORDER — DIPHENHYDRAMINE HCL 25 MG PO CAPS
50.0000 mg | ORAL_CAPSULE | Freq: Once | ORAL | Status: AC
  Administered 2021-02-11: 50 mg via ORAL
  Filled 2021-02-11: qty 2

## 2021-02-11 MED ORDER — SODIUM CHLORIDE 0.9% FLUSH
10.0000 mL | INTRAVENOUS | Status: DC | PRN
  Administered 2021-02-11: 10 mL

## 2021-02-11 NOTE — Progress Notes (Signed)
Patient complains of pain and discomfort in the right breast which is not improving.  Therefore I would like to obtain a mammogram and ultrasound of the right breast for further evaluation.

## 2021-02-11 NOTE — Patient Instructions (Signed)
[facility] ONCOLOG[facility]  Discharge Instructions: Thank you for choosing [facility] to provide your oncology and hematology care.   If you have a lab appointment with the [facility], please go directly to the [facility] and check in at the registration area.   Wear comfortable clothing and clothing appropriate for easy access to any Portacath or PICC line.   We strive to give you quality time with your provider. You may need to reschedule your appointment if you arrive late (15 or more minutes).  Arriving late affects you and other patients whose appointments are after yours.  Also, if you miss three or more appointments without notifying the office, you may be dismissed from the clinic at the provider's discretion.      For prescription refill requests, have your pharmacy contact our office and allow 72 hours for refills to be completed.    Today you received the following chemotherapy and/or immunotherapy agents:  trastuzumab/pertuzumab.      To help prevent nausea and vomiting after your treatment, we encourage you to take your nausea medication as directed.  BELOW ARE SYMPTOMS THAT SHOULD BE REPORTED IMMEDIATELY: *FEVER GREATER THAN 100.4 F (38 C) OR HIGHER *CHILLS OR SWEATING *NAUSEA AND VOMITING THAT IS NOT CONTROLLED WITH YOUR NAUSEA MEDICATION *UNUSUAL SHORTNESS OF BREATH *UNUSUAL BRUISING OR BLEEDING *URINARY PROBLEMS (pain or burning when urinating, or frequent urination) *BOWEL PROBLEMS (unusual diarrhea, constipation, pain near the anus) TENDERNESS IN MOUTH AND THROAT WITH OR WITHOUT PRESENCE OF ULCERS (sore throat, sores in mouth, or a toothache) UNUSUAL RASH, SWELLING OR PAIN  UNUSUAL VAGINAL DISCHARGE OR ITCHING   Items with * indicate a potential emergency and should be followed up as soon as possible or go to the Emergency Department if any problems should occur.  Please show the CHEMOTHERAPY ALERT CARD or IMMUNOTHERAPY ALERT CARD  at check-in to the Emergency Department and triage nurse.  Should you have questions after your visit or need to cancel or reschedule your appointment, please contact [facility]  Dept: [phone]  and follow the prompts.  Office hours are 8:00 a.m. to 4:30 p.m. Monday - Friday. Please note that voicemails left after 4:00 p.m. may not be returned until the following business day.  We are closed weekends and major holidays. You have access to a nurse at all times for urgent questions. Please call the main number to the clinic Dept: [phone] and follow the prompts.   For any non-urgent questions, you may also contact your provider using MyChart. We now offer e-Visits for anyone 46 and older to request care online for non-urgent symptoms. For details visit [email or web address].   Also download the MyChart app! Go to the app store, search "MyChart", open the app, select [facility], and log in with your MyChart username and password.  Due to Covid, a mask is required upon entering the hospital/clinic. If you do not have a mask, one will be given to you upon arrival. For doctor visits, patients may have 1 support person aged 59 or older with them. For treatment visits, patients cannot have anyone with them due to current Covid guidelines and our immunocompromised population.

## 2021-02-17 ENCOUNTER — Other Ambulatory Visit: Payer: Self-pay | Admitting: Hematology and Oncology

## 2021-02-22 ENCOUNTER — Encounter: Payer: Self-pay | Admitting: Hematology and Oncology

## 2021-02-27 ENCOUNTER — Other Ambulatory Visit: Payer: Self-pay

## 2021-02-27 ENCOUNTER — Ambulatory Visit
Admission: RE | Admit: 2021-02-27 | Discharge: 2021-02-27 | Disposition: A | Discharge status: Home / Self Care | Source: Ambulatory Visit | Attending: Hematology and Oncology | Admitting: Hematology and Oncology

## 2021-02-27 ENCOUNTER — Ambulatory Visit

## 2021-02-27 ENCOUNTER — Encounter: Payer: Self-pay | Admitting: Hematology and Oncology

## 2021-02-27 DIAGNOSIS — C50411 Malignant neoplasm of upper-outer quadrant of right female breast: Secondary | ICD-10-CM

## 2021-02-27 DIAGNOSIS — Z171 Estrogen receptor negative status [ER-]: Secondary | ICD-10-CM

## 2021-02-27 HISTORY — DX: Personal history of antineoplastic chemotherapy: Z92.21

## 2021-03-03 NOTE — Progress Notes (Signed)
Patient Care Team: [name], MD as PCP - General (Internal Medicine) Rockwell Germany, RN as Oncology Nurse Navigator Mauro Kaufmann, RN as Oncology Nurse Navigator Coralie Keens, MD as Consulting Physician (General Surgery) Nicholas Lose, MD as Consulting Physician (Hematology and Oncology) [name], MD as Consulting Physician (Radiation Oncology)  DIAGNOSIS:    ICD-10-CM   1. Malignant neoplasm of upper-outer quadrant of right breast in female, estrogen receptor negative ([place])  C50.411    Z17.1       SUMMARY OF ONCOLOGIC HISTORY: Oncology History  Malignant neoplasm of upper-outer quadrant of right breast in female, estrogen receptor negative ([place])  03/28/2020 Initial Diagnosis   Patient palpated a right breast mass x2 wks. Mammogram and US showed a 2.0cm mass at the 10 o'clock position in the right breast and two enlarged right axillary lymph nodes, 2.0cm and 1.7cm. 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   04/16/2020 - 07/30/2020 Chemotherapy   TCHP x6 cycles, pathologic complete response, Herceptin Perjeta maintenance    04/18/2020 Genetic Testing   Negative genetic testing: no pathogenic variants detected in [facility] Multi-Cancer Panel.  Variants of uncertain significance detected in CDH1 at c.1289T>G (p.Val430Gly) and EGFR at c.2873G>A (p.Arg958His).  The report date is April 18, 2020.   The Multi-Cancer Panel offered by [facility] includes sequencing and/or deletion duplication testing of the following 85 genes: AIP, ALK, APC, ATM, AXIN2,BAP1,  BARD1, BLM, BMPR1A, BRCA1, BRCA2, BRIP1, CASR, CDC73, CDH1, CDK4, CDKN1B, CDKN1C, CDKN2A (p14ARF), CDKN2A (p16INK4a), CEBPA, CHEK2, CTNNA1, DICER1, DIS3L2, EGFR, EPCAM (Deletion/duplication testing only), FH, FLCN, GATA2, GPC3, GREM1 (Promoter region deletion/duplication testing only), HOXB13 (c.251G>A, p.Gly84Glu), HRAS, KIT, MAX, MEN1, MET, MITF (c.952G>A,  p.Glu318Lys variant only), MLH1, MSH2, MSH3, MSH6, MUTYH, NBN, NF1, NF2, NTHL1, PALB2, PDGFRA, PHOX2B, PMS2, POLD1, POLE, POT1, PRKAR1A, PTCH1, PTEN, RAD50, RAD51C, RAD51D, RB1, RECQL4, RET, RNF43, RUNX1, SDHAF2, SDHA (sequence changes only), SDHB, SDHC, SDHD, SMAD4, SMARCA4, SMARCB1, SMARCE1, STK11, SUFU, TERC, TERT, TMEM127, TP53, TSC1, TSC2, VHL, WRN and WT1.    08/20/2020 Surgery   Right lumpectomy [name]): no residual carcinoma, 7 right axillary lymph nodes negative for carcinoma.   09/10/2020 -  Chemotherapy   Patient is on Treatment Plan : BREAST Trastuzumab + Pertuzumab q21d     09/26/2020 - 11/12/2020 Radiation Therapy   Adjuvant radiation     CHIEF COMPLIANT: Cycle 9 Herceptin Perjeta   INTERVAL HISTORY: Shannon Kane is a 50 y.o. with above-mentioned history of right breast cancer treated with lumpectomy, radiation, and neoadjuvant chemotherapy with [place], currently on Herceptin Perjeta maintenance. She presents to the clinic today for treatment.  She is tolerating Herceptin Perjeta fairly well.  She has 2 more treatments left.  She cannot tolerate the full tablet of anastrozole daily and therefore she is taking half a tablet a day.  She is doing much better with a half a tablet with less joint aches and pains.  ALLERGIES:  is allergic to amoxicillin, ciprofloxacin, and penicillins.  MEDICATIONS:  Current Outpatient Medications  Medication Sig Dispense Refill   Cholecalciferol (VITAMIN D-3 PO) Take 6,000 Units by mouth daily.      letrozole (FEMARA) 2.5 MG tablet TAKE 1 TABLET BY MOUTH EVERY DAY 30 tablet 0   lidocaine-prilocaine (EMLA) cream Apply 1 application topically as needed.     MAGNESIUM CITRATE PO Take 500 mcg by mouth at bedtime as needed (sleep). Only when having trouble sleeping  SYNTHROID 75 MCG tablet Take 1 tablet (75 mcg total) by mouth daily before breakfast. 90 tablet 1   valsartan (DIOVAN) 160 MG tablet Take 1 tablet (160 mg total) by mouth daily. 90  tablet 2   vitamin B-12 (CYANOCOBALAMIN) 1000 MCG tablet Take 1,000 mcg by mouth daily.     No current facility-administered medications for this visit.    PHYSICAL EXAMINATION: ECOG PERFORMANCE STATUS: 1 - Symptomatic but completely ambulatory  Vitals:   03/04/21 0928  BP: (!) 156/84  Pulse: 86  Resp: 18  Temp: (!) 97.3 F (36.3 C)  SpO2: 98%   Filed Weights   03/04/21 0928  Weight: 190 lb 6.4 oz (86.4 kg)     LABORATORY DATA:  I have reviewed the data as listed CMP Latest Ref Rng & Units 01/21/2021 12/10/2020 11/12/2020  Glucose 70 - 99 mg/dL 98 88 89  BUN 6 - 20 mg/dL 11 10 10   Creatinine 0.44 - 1.00 mg/dL 0.85 0.83 0.77  Sodium 135 - 145 mmol/L 142 142 140  Potassium 3.5 - 5.1 mmol/L 3.7 3.7 3.8  Chloride 98 - 111 mmol/L 108 107 107  CO2 22 - 32 mmol/L 25 26 25   Calcium 8.9 - 10.3 mg/dL 9.1 9.5 8.9  Total Protein 6.5 - 8.1 g/dL 7.3 7.8 7.6  Total Bilirubin 0.3 - 1.2 mg/dL 0.4 0.6 0.5  Alkaline Phos 38 - 126 U/L 105 87 91  AST 15 - 41 U/L 25 32 21  ALT 0 - 44 U/L 25 22 16     Lab Results  Component Value Date   WBC 4.1 03/04/2021   HGB 12.7 03/04/2021   HCT 38.2 03/04/2021   MCV 92.5 03/04/2021   PLT 204 03/04/2021   NEUTROABS 2.7 03/04/2021    ASSESSMENT & PLAN:  Malignant neoplasm of upper-outer quadrant of right breast in female, estrogen receptor negative ([place]) 03/28/2020:Patient palpated a right breast mass x2 wks. Mammogram and US showed a 2.0cm mass at the 10 o'clock position in the right breast and two enlarged right axillary lymph nodes, 2.0cm and 1.7cm. 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 Satellite lesion biopsy: DCIS ER 0%, PR 0%   Treatment plan: 1. Neoadjuvant chemotherapy with TCH Perjeta 6 cycles followed by Herceptin Perjeta maintenance for 1 year 2. 08/20/2020:Right lumpectomy [name]): no residual carcinoma, 7 right axillary lymph nodes negative for carcinoma. 3. Adjuvant  radiation: 09/26/2020-11/12/2020 ----------------------------------------------------------------------------------------------------------------------------------------- Current treatment: Completed 6 cycles of [place], continue with Herceptin Perjeta maintenance   Echocardiogram 09/17/2020: EF 60 to 65%   Severe leg pains: Possibly due to plantar fasciitis. Nail changes: Her big toenail appears to be getting ready to come out. Insect bites: Probably ant bites. Anastrozole toxicities: Joint stiffness which are improved with taking half a tablet daily. Her daughter is a Equities trader at [employer]   Return to clinic in 3 weeks for Herceptin Perjeta and in 6 weeks for her last treatment.   No orders of the defined types were placed in this encounter.  The patient has a good understanding of the overall plan. she agrees with it. she will call with any problems that may develop before the next visit here.  Total time spent: 30 mins including face to face time and time spent for planning, charting and coordination of care  Rulon Eisenmenger, MD, MPH 03/04/2021  I, Thana Ates, am acting as scribe for Dr. Nicholas Lose.  I have reviewed  the above documentation for accuracy and completeness, and I agree with the above.

## 2021-03-04 ENCOUNTER — Inpatient Hospital Stay (HOSPITAL_BASED_OUTPATIENT_CLINIC_OR_DEPARTMENT_OTHER): Admitting: Hematology and Oncology

## 2021-03-04 ENCOUNTER — Other Ambulatory Visit: Payer: Self-pay

## 2021-03-04 ENCOUNTER — Other Ambulatory Visit: Payer: Self-pay | Admitting: Surgery

## 2021-03-04 ENCOUNTER — Inpatient Hospital Stay: Attending: Hematology and Oncology

## 2021-03-04 ENCOUNTER — Inpatient Hospital Stay

## 2021-03-04 DIAGNOSIS — Z95828 Presence of other vascular implants and grafts: Secondary | ICD-10-CM

## 2021-03-04 DIAGNOSIS — Z171 Estrogen receptor negative status [ER-]: Secondary | ICD-10-CM | POA: Insufficient documentation

## 2021-03-04 DIAGNOSIS — Z5112 Encounter for antineoplastic immunotherapy: Secondary | ICD-10-CM | POA: Insufficient documentation

## 2021-03-04 DIAGNOSIS — C50411 Malignant neoplasm of upper-outer quadrant of right female breast: Secondary | ICD-10-CM

## 2021-03-04 LAB — CBC WITH DIFFERENTIAL (CANCER CENTER ONLY)
Abs Immature Granulocytes: 0 10*3/uL (ref 0.00–0.07)
Basophils Absolute: 0 10*3/uL (ref 0.0–0.1)
Basophils Relative: 1 %
Eosinophils Absolute: 0.2 10*3/uL (ref 0.0–0.5)
Eosinophils Relative: 5 %
HCT: 38.2 % (ref 36.0–46.0)
Hemoglobin: 12.7 g/dL (ref 12.0–15.0)
Immature Granulocytes: 0 %
Lymphocytes Relative: 18 %
Lymphs Abs: 0.7 10*3/uL (ref 0.7–4.0)
MCH: 30.8 pg (ref 26.0–34.0)
MCHC: 33.2 g/dL (ref 30.0–36.0)
MCV: 92.5 fL (ref 80.0–100.0)
Monocytes Absolute: 0.4 10*3/uL (ref 0.1–1.0)
Monocytes Relative: 11 %
Neutro Abs: 2.7 10*3/uL (ref 1.7–7.7)
Neutrophils Relative %: 65 %
Platelet Count: 204 10*3/uL (ref 150–400)
RBC: 4.13 MIL/uL (ref 3.87–5.11)
RDW: 13.5 % (ref 11.5–15.5)
WBC Count: 4.1 10*3/uL (ref 4.0–10.5)
nRBC: 0 % (ref 0.0–0.2)

## 2021-03-04 LAB — CMP (CANCER CENTER ONLY)
ALT: 18 U/L (ref 0–44)
AST: 21 U/L (ref 15–41)
Albumin: 3.8 g/dL (ref 3.5–5.0)
Alkaline Phosphatase: 99 U/L (ref 38–126)
Anion gap: 10 (ref 5–15)
BUN: 11 mg/dL (ref 6–20)
CO2: 25 mmol/L (ref 22–32)
Calcium: 9.3 mg/dL (ref 8.9–10.3)
Chloride: 108 mmol/L (ref 98–111)
Creatinine: 0.79 mg/dL (ref 0.44–1.00)
GFR, Estimated: >60 mL/min (ref >60)
Glucose, Bld: 103 mg/dL — ABNORMAL HIGH (ref 70–99)
Potassium: 3.8 mmol/L (ref 3.5–5.1)
Sodium: 143 mmol/L (ref 135–145)
Total Bilirubin: 0.5 mg/dL (ref 0.3–1.2)
Total Protein: 7.4 g/dL (ref 6.5–8.1)

## 2021-03-04 MED ORDER — SODIUM CHLORIDE 0.9% FLUSH
10.0000 mL | Freq: Once | INTRAVENOUS | Status: AC
  Administered 2021-03-04: 10 mL

## 2021-03-04 MED ORDER — TRASTUZUMAB-DKST CHEMO 150 MG IV SOLR
6.0000 mg/kg | Freq: Once | INTRAVENOUS | Status: AC
  Administered 2021-03-04: 525 mg via INTRAVENOUS
  Filled 2021-03-04: qty 25

## 2021-03-04 MED ORDER — DIPHENHYDRAMINE HCL 25 MG PO CAPS: 50.0000 mg | ORAL_CAPSULE | Freq: Once | ORAL | Status: DC

## 2021-03-04 MED ORDER — SODIUM CHLORIDE 0.9 % IV SOLN: Freq: Once | INTRAVENOUS | Status: AC

## 2021-03-04 MED ORDER — SODIUM CHLORIDE 0.9% FLUSH
10.0000 mL | INTRAVENOUS | Status: DC | PRN
Start: 2021-03-04 — End: 2021-03-04
  Administered 2021-03-04: 10 mL

## 2021-03-04 MED ORDER — SODIUM CHLORIDE 0.9 % IV SOLN
420.0000 mg | Freq: Once | INTRAVENOUS | Status: AC
  Administered 2021-03-04: 420 mg via INTRAVENOUS
  Filled 2021-03-04: qty 14

## 2021-03-04 MED ORDER — ACETAMINOPHEN 325 MG PO TABS: 650.0000 mg | ORAL_TABLET | Freq: Once | ORAL | Status: DC

## 2021-03-04 MED ORDER — HEPARIN SOD (PORK) LOCK FLUSH 100 UNIT/ML IV SOLN
500.0000 [IU] | Freq: Once | INTRAVENOUS | Status: AC | PRN
  Administered 2021-03-04: 500 [IU]

## 2021-03-04 NOTE — Assessment & Plan Note (Signed)
03/28/2020:Patient palpated a right breast mass x2 wks. Mammogram and US showed a 2.0cm mass at the 10 o'clock position in the right breast and two enlarged right axillary lymph nodes, 2.0cm and 1.7cm. 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 Satellite lesion biopsy: DCIS ER 0%, PR 0%  Treatment plan: 1. Neoadjuvant chemotherapy with TCH Perjeta 6 cycles followed by HerceptinPerjeta maintenance for 1 year 2.08/20/2020:Right lumpectomy [name]): no residual carcinoma, 7 right axillary lymph nodes negative for carcinoma. 3. Adjuvant radiation:09/26/2020-11/12/2020 ----------------------------------------------------------------------------------------------------------------------------------------- Current treatment:Completed 6 cycles of [place], continue with Herceptin Perjeta maintenance(to be completed 04/04/2021) Echocardiogram5/07/2020: EF 60 to 65%  Severe leg pains: Possibly due to plantar fasciitis. Nail changes: She has lost one of her toenails and she thinks she may lose several of her fingers nails as well as other toenails as well.  I briefly discussed the role of antiestrogen therapy because her original tumor was 10% weakly positive from the estrogen receptor.

## 2021-03-04 NOTE — Patient Instructions (Signed)
[facility] ONCOLOGY  Discharge Instructions: Thank you for choosing [facility] to provide your oncology and hematology care.   If you have a lab appointment with the [facility], please go directly to the [facility] and check in at the registration area.   Wear comfortable clothing and clothing appropriate for easy access to any Portacath or PICC line.   We strive to give you quality time with your provider. You may need to reschedule your appointment if you arrive late (15 or more minutes).  Arriving late affects you and other patients whose appointments are after yours.  Also, if you miss three or more appointments without notifying the office, you may be dismissed from the clinic at the provider's discretion.      For prescription refill requests, have your pharmacy contact our office and allow 72 hours for refills to be completed.    Today you received the following chemotherapy and/or immunotherapy agents :  Trastuzumab & Pertuzumab.      To help prevent nausea and vomiting after your treatment, we encourage you to take your nausea medication as directed.  BELOW ARE SYMPTOMS THAT SHOULD BE REPORTED IMMEDIATELY: *FEVER GREATER THAN 100.4 F (38 C) OR HIGHER *CHILLS OR SWEATING *NAUSEA AND VOMITING THAT IS NOT CONTROLLED WITH YOUR NAUSEA MEDICATION *UNUSUAL SHORTNESS OF BREATH *UNUSUAL BRUISING OR BLEEDING *URINARY PROBLEMS (pain or burning when urinating, or frequent urination) *BOWEL PROBLEMS (unusual diarrhea, constipation, pain near the anus) TENDERNESS IN MOUTH AND THROAT WITH OR WITHOUT PRESENCE OF ULCERS (sore throat, sores in mouth, or a toothache) UNUSUAL RASH, SWELLING OR PAIN  UNUSUAL VAGINAL DISCHARGE OR ITCHING   Items with * indicate a potential emergency and should be followed up as soon as possible or go to the Emergency Department if any problems should occur.  Please show the CHEMOTHERAPY ALERT CARD or IMMUNOTHERAPY ALERT  CARD at check-in to the Emergency Department and triage nurse.  Should you have questions after your visit or need to cancel or reschedule your appointment, please contact [facility]  Dept: [phone]  and follow the prompts.  Office hours are 8:00 a.m. to 4:30 p.m. Monday - Friday. Please note that voicemails left after 4:00 p.m. may not be returned until the following business day.  We are closed weekends and major holidays. You have access to a nurse at all times for urgent questions. Please call the main number to the clinic Dept: [phone] and follow the prompts.   For any non-urgent questions, you may also contact your provider using MyChart. We now offer e-Visits for anyone 43 and older to request care online for non-urgent symptoms. For details visit [email or web address].   Also download the MyChart app! Go to the app store, search "MyChart", open the app, select [facility], and log in with your MyChart username and password.  Due to Covid, a mask is required upon entering the hospital/clinic. If you do not have a mask, one will be given to you upon arrival. For doctor visits, patients may have 1 support person aged 63 or older with them. For treatment visits, patients cannot have anyone with them due to current Covid guidelines and our immunocompromised population.

## 2021-03-04 NOTE — Progress Notes (Signed)
Pt had ECHO done 02/04/21 but has not been read.  Verified with Dr Lindi Adie, [place] to treat.  Pt not having any heart related symptoms.

## 2021-03-05 ENCOUNTER — Other Ambulatory Visit

## 2021-03-06 LAB — ECHOCARDIOGRAM COMPLETE
AR max vel: 2.8 cm2
AV Area VTI: 2.7 cm2
AV Area mean vel: 2.82 cm2
AV Mean grad: 3 mmHg
AV Peak grad: 5.2 mmHg
Ao pk vel: 1.14 m/s
Area-P 1/2: 3.79 cm2
S' Lateral: 2.5 cm
Single Plane A4C EF: 56.1 %

## 2021-03-10 ENCOUNTER — Other Ambulatory Visit: Payer: Self-pay | Admitting: *Deleted

## 2021-03-10 ENCOUNTER — Ambulatory Visit: Attending: Hematology and Oncology

## 2021-03-10 ENCOUNTER — Other Ambulatory Visit: Payer: Self-pay

## 2021-03-10 VITALS — Wt 189.4 lb

## 2021-03-10 DIAGNOSIS — Z171 Estrogen receptor negative status [ER-]: Secondary | ICD-10-CM

## 2021-03-10 DIAGNOSIS — Z483 Aftercare following surgery for neoplasm: Secondary | ICD-10-CM | POA: Insufficient documentation

## 2021-03-10 DIAGNOSIS — C50411 Malignant neoplasm of upper-outer quadrant of right female breast: Secondary | ICD-10-CM

## 2021-03-10 DIAGNOSIS — I890 Lymphedema, not elsewhere classified: Secondary | ICD-10-CM

## 2021-03-10 NOTE — Therapy (Signed)
[facility] @ [place], [place], [address] Phone: [phone]   Fax:  [phone]  Physical Therapy Treatment  Patient Details  Name: Shannon Kane MRN: [id number] Date of Birth: 12-18-1970 Referring Provider (PT): Dr. Coralie Keens   Encounter Date: 03/10/2021   PT End of Session - 03/10/21 0830     Visit Number 2   # unchanged due to screen only   PT Start Time 0823    PT Stop Time 0832    PT Time Calculation (min) 9 min    Activity Tolerance Patient tolerated treatment well    Behavior During Therapy [facility] for tasks assessed/performed             Past Medical History:  Diagnosis Date   Abnormal glucose    Breast cancer ([place] [facility]) 03/2020   right breast IDC   Family history of breast cancer 04/03/2020   Family history of ovarian cancer 04/03/2020   History of radiation therapy    Right breast- 09/25/20-11/12/20- Dr. [name]   Hypertension    Hyperthyroidism    in the past   Hypothyroidism    Iron deficiency anemia    Palpitation    Personal history of chemotherapy    Spontaneous ecchymoses    Tachycardia    Vitamin D deficiency     Past Surgical History:  Procedure Laterality Date   BREAST BIOPSY     BREAST EXCISIONAL BIOPSY     BREAST LUMPECTOMY Right 08/20/2020   w/ rad w/ chemo   BREAST LUMPECTOMY WITH RADIOACTIVE SEED LOCALIZATION Right 08/20/2020   Procedure: RADIOACTIVE SEED GUIDED RIGHT BREAST LUMPECTOMY;  Surgeon: Coralie Keens, MD;  Location: [facility];  Service: General;  Laterality: Right;   CHOLECYSTECTOMY     COLONOSCOPY  05/18/2018   KNEE SURGERY     PORTACATH PLACEMENT Left 04/15/2020   Procedure: INSERTION PORT-A-CATH;  Surgeon: Coralie Keens, MD;  Location: [facility];  Service: General;  Laterality: Left;   RADIOACTIVE SEED [place] Right 08/20/2020   Procedure: RADIOACTIVE SEED TARGETED RIGHT AXILLARY LYMPH NODE  DISSECTION;  Surgeon: Coralie Keens, MD;  Location: [facility];  Service: General;  Laterality: Right;   TONSILLECTOMY      Vitals:   03/10/21 0824  Weight: 189 lb 6 oz (85.9 kg)     Subjective Assessment - 03/10/21 0824     Subjective Pt returns for her 3 month L-Dex screen. "I've started notciing some firmness in my breast and my skin looks like an orange peel since completing radiation."    Pertinent History Patient was diagnosed on 03/14/2020 with right grade 2 invasive ductal carcinoma breast cancer.  She underwent neoadjuvant chemo followed by a right lumpectomy and targeted node dissection (7 negative lymph nodes) on 08/20/2020. It is ER/PR negative and HER2 positive with a Ki67 of 30%. She has a positive axillary lymph node.                    L-DEX FLOWSHEETS - 03/10/21 0800       L-DEX LYMPHEDEMA SCREENING   Measurement Type Unilateral    L-DEX MEASUREMENT EXTREMITY Upper Extremity    POSITION  Standing    DOMINANT SIDE Left    At Risk Side Right    BASELINE SCORE (UNILATERAL) 4.2    L-DEX SCORE (UNILATERAL) -2.4    VALUE CHANGE (UNILAT) -6.6  PT Long Term Goals - 09/09/20 1646       PT LONG TERM GOAL #1   Title Patient will demonstrate she has regained full shoulder ROM and function post operatively compared to baselines.    Time 6    Period Months    Status Achieved                   Plan - 03/10/21 9798     Clinical Impression Statement Pt returns for her 3 month L-Dex screen. Her change from baseline of -6.6 is well WNLs. She reports noticing peau del'orange of her Rt medial breast and an area of firmness at same area. Educated her about breast lymphedema and how this is common after completing raditaion recently as she has and that we can treat her for this with manaul lymph drainage and for her to ear her compression bra 24/7 (just wearing for exercise now). Pt verbalized  understanding and is interested in pursuing PT at this time so request for reeferral sent to [facility] for Dr. Lindi Adie and pt to schedule eval today along with her next 3 month L-Dex screen.    PT Next Visit Plan Eval for Rt breast lymphedema. Cont every 3 month L-Dex screens for up to 2 years from her SLNB.    Consulted and Agree with Plan of Care Patient             Patient will benefit from skilled therapeutic intervention in order to improve the following deficits and impairments:     Visit Diagnosis: Aftercare following surgery for neoplasm     Problem List Patient Active Problem List   Diagnosis Date Noted   Port-A-Cath in place 05/07/2020   Menorrhagia 04/19/2020   Uterine leiomyoma 04/19/2020   Genetic testing 04/19/2020   Family history of ovarian cancer 04/03/2020   Family history of leukemia 04/03/2020   Family history of breast cancer 04/03/2020   Malignant neoplasm of upper-outer quadrant of right breast in female, estrogen receptor negative ([place]) 03/28/2020   Infiltrating ductal carcinoma of breast ([place]) 03/27/2020   Overweight (BMI 25.0-29.9) 12/05/2019   Essential hypertension, benign 05/29/2018   Iritis of left eye 05/29/2018   Iron deficiency anemia due to chronic blood loss 05/29/2018   Primary hypothyroidism 05/29/2018   Stress incontinence in female 02/16/2016    Otelia Limes, PTA 03/10/2021, 8:44 AM  [facility] @ [place], [place], [address] Phone: [phone]   Fax:  [phone]  Name: Shannon Kane MRN: [id number] Date of Birth: 08-Oct-1970

## 2021-03-16 ENCOUNTER — Other Ambulatory Visit: Payer: Self-pay | Admitting: Hematology and Oncology

## 2021-03-17 ENCOUNTER — Encounter: Payer: Self-pay | Admitting: Hematology and Oncology

## 2021-03-25 ENCOUNTER — Ambulatory Visit: Attending: Hematology and Oncology | Admitting: Rehabilitation

## 2021-03-25 ENCOUNTER — Inpatient Hospital Stay

## 2021-03-25 ENCOUNTER — Other Ambulatory Visit: Payer: Self-pay

## 2021-03-25 ENCOUNTER — Encounter: Payer: Self-pay | Admitting: Rehabilitation

## 2021-03-25 VITALS — BP 147/90 | HR 90 | Temp 98.9°F | Resp 16 | Wt 189.2 lb

## 2021-03-25 DIAGNOSIS — I890 Lymphedema, not elsewhere classified: Secondary | ICD-10-CM | POA: Insufficient documentation

## 2021-03-25 DIAGNOSIS — Z483 Aftercare following surgery for neoplasm: Secondary | ICD-10-CM | POA: Diagnosis present

## 2021-03-25 DIAGNOSIS — C50411 Malignant neoplasm of upper-outer quadrant of right female breast: Secondary | ICD-10-CM | POA: Diagnosis present

## 2021-03-25 DIAGNOSIS — Z5112 Encounter for antineoplastic immunotherapy: Secondary | ICD-10-CM | POA: Insufficient documentation

## 2021-03-25 DIAGNOSIS — L599 Disorder of the skin and subcutaneous tissue related to radiation, unspecified: Secondary | ICD-10-CM | POA: Insufficient documentation

## 2021-03-25 DIAGNOSIS — Z171 Estrogen receptor negative status [ER-]: Secondary | ICD-10-CM | POA: Diagnosis present

## 2021-03-25 MED ORDER — SODIUM CHLORIDE 0.9 % IV SOLN
420.0000 mg | Freq: Once | INTRAVENOUS | Status: AC
  Administered 2021-03-25: 420 mg via INTRAVENOUS
  Filled 2021-03-25: qty 14

## 2021-03-25 MED ORDER — SODIUM CHLORIDE 0.9 % IV SOLN: Freq: Once | INTRAVENOUS | Status: AC

## 2021-03-25 MED ORDER — SODIUM CHLORIDE 0.9% FLUSH
10.0000 mL | INTRAVENOUS | Status: DC | PRN
  Administered 2021-03-25: 10 mL

## 2021-03-25 MED ORDER — HEPARIN SOD (PORK) LOCK FLUSH 100 UNIT/ML IV SOLN
500.0000 [IU] | Freq: Once | INTRAVENOUS | Status: AC | PRN
  Administered 2021-03-25: 500 [IU]

## 2021-03-25 MED ORDER — TRASTUZUMAB-DKST CHEMO 150 MG IV SOLR
6.0000 mg/kg | Freq: Once | INTRAVENOUS | Status: AC
  Administered 2021-03-25: 525 mg via INTRAVENOUS
  Filled 2021-03-25: qty 25

## 2021-03-25 MED ORDER — ACETAMINOPHEN 325 MG PO TABS
650.0000 mg | ORAL_TABLET | Freq: Once | ORAL | Status: AC
  Administered 2021-03-25: 650 mg via ORAL
  Filled 2021-03-25: qty 2

## 2021-03-25 MED ORDER — DIPHENHYDRAMINE HCL 25 MG PO CAPS
50.0000 mg | ORAL_CAPSULE | Freq: Once | ORAL | Status: AC
  Administered 2021-03-25: 50 mg via ORAL
  Filled 2021-03-25: qty 2

## 2021-03-25 NOTE — Patient Instructions (Signed)
[facility] ONCOLOGY  Discharge Instructions: Thank you for choosing [facility] to provide your oncology and hematology care.   If you have a lab appointment with the [facility], please go directly to the [facility] and check in at the registration area.   Wear comfortable clothing and clothing appropriate for easy access to any Portacath or PICC line.   We strive to give you quality time with your provider. You may need to reschedule your appointment if you arrive late (15 or more minutes).  Arriving late affects you and other patients whose appointments are after yours.  Also, if you miss three or more appointments without notifying the office, you may be dismissed from the clinic at the provider's discretion.      For prescription refill requests, have your pharmacy contact our office and allow 72 hours for refills to be completed.    Today you received the following chemotherapy and/or immunotherapy agents Herceptin and Perjeta      To help prevent nausea and vomiting after your treatment, we encourage you to take your nausea medication as directed.  BELOW ARE SYMPTOMS THAT SHOULD BE REPORTED IMMEDIATELY: *FEVER GREATER THAN 100.4 F (38 C) OR HIGHER *CHILLS OR SWEATING *NAUSEA AND VOMITING THAT IS NOT CONTROLLED WITH YOUR NAUSEA MEDICATION *UNUSUAL SHORTNESS OF BREATH *UNUSUAL BRUISING OR BLEEDING *URINARY PROBLEMS (pain or burning when urinating, or frequent urination) *BOWEL PROBLEMS (unusual diarrhea, constipation, pain near the anus) TENDERNESS IN MOUTH AND THROAT WITH OR WITHOUT PRESENCE OF ULCERS (sore throat, sores in mouth, or a toothache) UNUSUAL RASH, SWELLING OR PAIN  UNUSUAL VAGINAL DISCHARGE OR ITCHING   Items with * indicate a potential emergency and should be followed up as soon as possible or go to the Emergency Department if any problems should occur.  Please show the CHEMOTHERAPY ALERT CARD or IMMUNOTHERAPY ALERT CARD at  check-in to the Emergency Department and triage nurse.  Should you have questions after your visit or need to cancel or reschedule your appointment, please contact [facility]  Dept: [phone]  and follow the prompts.  Office hours are 8:00 a.m. to 4:30 p.m. Monday - Friday. Please note that voicemails left after 4:00 p.m. may not be returned until the following business day.  We are closed weekends and major holidays. You have access to a nurse at all times for urgent questions. Please call the main number to the clinic Dept: [phone] and follow the prompts.   For any non-urgent questions, you may also contact your provider using MyChart. We now offer e-Visits for anyone 85 and older to request care online for non-urgent symptoms. For details visit [email or web address].   Also download the MyChart app! Go to the app store, search "MyChart", open the app, select [facility], and log in with your MyChart username and password.  Due to Covid, a mask is required upon entering the hospital/clinic. If you do not have a mask, one will be given to you upon arrival. For doctor visits, patients may have 1 support person aged 64 or older with them. For treatment visits, patients cannot have anyone with them due to current Covid guidelines and our immunocompromised population.

## 2021-03-25 NOTE — Progress Notes (Signed)
Ok to run perjeta over 1 hour

## 2021-03-25 NOTE — Therapy (Signed)
[facility] @ [facility] [place], [place], [address] Phone: [phone]   Fax:  [phone]  Physical Therapy Treatment  Patient Details  Name: Shannon Kane MRN: [id number] Date of Birth: 1971/03/30 Referring Provider (PT): Dr. Coralie Keens   Encounter Date: 03/25/2021   PT End of Session - 03/25/21 1559     Visit Number 3    Number of Visits 9    Date for PT Re-Evaluation 05/20/21    PT Start Time 1500    PT Stop Time 8250    PT Time Calculation (min) 49 min    Activity Tolerance Patient tolerated treatment well    Behavior During Therapy [facility] for tasks assessed/performed             Past Medical History:  Diagnosis Date   Abnormal glucose    Breast cancer ([place]) 03/2020   right breast IDC   Family history of breast cancer 04/03/2020   Family history of ovarian cancer 04/03/2020   History of radiation therapy    Right breast- 09/25/20-11/12/20- Dr. [name]   Hypertension    Hyperthyroidism    in the past   Hypothyroidism    Iron deficiency anemia    Palpitation    Personal history of chemotherapy    Spontaneous ecchymoses    Tachycardia    Vitamin D deficiency     Past Surgical History:  Procedure Laterality Date   BREAST BIOPSY     BREAST EXCISIONAL BIOPSY     BREAST LUMPECTOMY Right 08/20/2020   w/ rad w/ chemo   BREAST LUMPECTOMY WITH RADIOACTIVE SEED LOCALIZATION Right 08/20/2020   Procedure: RADIOACTIVE SEED GUIDED RIGHT BREAST LUMPECTOMY;  Surgeon: Coralie Keens, MD;  Location: [facility];  Service: General;  Laterality: Right;   CHOLECYSTECTOMY     COLONOSCOPY  05/18/2018   KNEE SURGERY     PORTACATH PLACEMENT Left 04/15/2020   Procedure: INSERTION PORT-A-CATH;  Surgeon: Coralie Keens, MD;  Location: [facility];  Service: General;  Laterality: Left;   RADIOACTIVE SEED GUIDED AXILLARY SENTINEL LYMPH NODE Right 08/20/2020   Procedure: RADIOACTIVE SEED TARGETED RIGHT  AXILLARY LYMPH NODE DISSECTION;  Surgeon: Coralie Keens, MD;  Location: [place] [facility];  Service: General;  Laterality: Right;   TONSILLECTOMY      There were no vitals filed for this visit.   Subjective Assessment - 03/25/21 1456     Subjective I started noticing it 6-9 weeks ago.  It is a bit swollen and uncomfortable. I have a compression bra I wear 24/7.    Pertinent History Patient was diagnosed on 03/14/2020 with right grade 2 invasive ductal carcinoma breast cancer.  She underwent neoadjuvant chemo followed by a right lumpectomy and targeted node dissection (7 negative lymph nodes) on 08/20/2020. It is ER/PR negative and HER2 positive with a Ki67 of 30%. She has a positive axillary lymph node. Still has port on the left side for infusion for Herception, Projeta.    Currently in Pain? No/denies    Pain Score --   it is just sore when i touch it               [facility] PT Assessment - 03/25/21 0001       Assessment   Medical Diagnosis s/p right lumpectomy and SLNB    Referring Provider (PT) Dr. 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      Cognition   Overall Cognitive Status Within Functional Limits for tasks assessed      Observation/Other Assessments   Observations Rt breast higher, darker from radiation and peau d orange skin more  medial breast, some puffiness in Rt axilla      Palpation   Palpation comment scar tissue vs density from incision towards breast and fibrosis medial/inferior breast                           [facility] Adult PT Treatment/Exercise - 03/25/21 0001       Manual Therapy   Manual Therapy Manual Lymphatic Drainage (MLD);Myofascial release;Soft tissue mobilization    Manual therapy comments gave pt small dott peach foam and chip pack for use in bra    Soft tissue mobilization to the Rt pectoralis, latissimus prior to MLD    Myofascial Release to the Rt axilla prior to MLD    Manual Lymphatic Drainage (MLD) with pt permission: clavicular, bil axillary, and Rt inguinal nodes, interaxillary pathway and Rt axilloinguinal pathway and then Rt breast and repeating all steps in reverse.  PT performed and then hand over hand cueing.  Given handout today                     PT Education - 03/25/21 1559     Education Details self MLD, scar massage review    Person(s) Educated Patient    Methods Explanation;Demonstration;Tactile cues;Verbal cues;Handout    Comprehension Verbalized understanding;Returned demonstration;Verbal cues required;Tactile cues required;Need further instruction                 PT Long Term Goals - 03/25/21 1656       PT LONG TERM GOAL #1   Title Pt will decrease breast heaviness and discomfort by at least 50%    Time 8    Period Weeks    Status New      PT LONG TERM GOAL #2   Title Pt will be ind with self MLD to the Rt breast    Time 8    Period Weeks    Status New                   Plan - 03/25/21 1652     Clinical Impression Statement Pt returns to PT for treatment on her Rt breast continued edema, fibrosis, and tenderness post radiation.  Pt has a compression bra that she has been using.  Started MLD and education on self MLD.  Also noted some increased scar tissue/need for MFR and STM to the axillary region and  pectoralis/latissimus.  Pt is very active and plays softball and exercises and she has noticed some bil lat tightness and almost spasm.  Discussed foam roll release as pt has  done this in the past and will check /add some thoracic rotation work to [employer]    Stability/Clinical Decision Making Stable/Uncomplicated    Clinical Decision Making Low    Rehab Potential Excellent    PT Frequency 1x / week    PT Duration 8 weeks    PT Treatment/Interventions ADLs/Self Care Home Management;Therapeutic exercise;Patient/family education;Manual techniques;Taping    PT Next Visit Plan how is breast after self MD and use of foam x a few weeks?  Continue Rt axillary MFR/STM for decongestion and continue MLD to the Rt breast.  Cont every 3 month L-Dex screens for up to 2 years from her SLNB.    Consulted and Agree with Plan of Care Patient             Patient will benefit from skilled therapeutic intervention in order to improve the following deficits and impairments:  Postural dysfunction, Impaired UE functional use, Pain, Decreased knowledge of precautions, Increased edema  Visit Diagnosis: Aftercare following surgery for neoplasm  Malignant neoplasm of upper-outer quadrant of right breast in female, estrogen receptor negative ([place])  Disorder of the skin and subcutaneous tissue related to radiation, unspecified     Problem List Patient Active Problem List   Diagnosis Date Noted   Port-A-Cath in place 05/07/2020   Menorrhagia 04/19/2020   Uterine leiomyoma 04/19/2020   Genetic testing 04/19/2020   Family history of ovarian cancer 04/03/2020   Family history of leukemia 04/03/2020   Family history of breast cancer 04/03/2020   Malignant neoplasm of upper-outer quadrant of right breast in female, estrogen receptor negative ([place]) 03/28/2020   Infiltrating ductal carcinoma of breast ([place]) 03/27/2020   Overweight (BMI 25.0-29.9) 12/05/2019   Essential hypertension, benign 05/29/2018   Iritis of  left eye 05/29/2018   Iron deficiency anemia due to chronic blood loss 05/29/2018   Primary hypothyroidism 05/29/2018   Stress incontinence in female 02/16/2016    Stark Bray, PT 03/25/2021, 4:57 PM  [facility] @ [place], [place], [address] Phone: [phone]   Fax:  [phone]  Name: Shannon Kane MRN: [id number] Date of Birth: 11/26/1970

## 2021-04-03 ENCOUNTER — Ambulatory Visit: Admitting: Physical Therapy

## 2021-04-03 ENCOUNTER — Other Ambulatory Visit: Payer: Self-pay

## 2021-04-03 DIAGNOSIS — Z483 Aftercare following surgery for neoplasm: Secondary | ICD-10-CM | POA: Diagnosis not present

## 2021-04-03 DIAGNOSIS — L599 Disorder of the skin and subcutaneous tissue related to radiation, unspecified: Secondary | ICD-10-CM

## 2021-04-03 DIAGNOSIS — C50411 Malignant neoplasm of upper-outer quadrant of right female breast: Secondary | ICD-10-CM

## 2021-04-03 NOTE — Therapy (Signed)
[facility] @ [place], [place], [address] Phone: [phone]   Fax:  [phone]  Physical Therapy Treatment  Patient Details  Name: Shannon Kane MRN: [id number] Date of Birth: 04-11-1971 Referring Provider (PT): Dr. Coralie Keens   Encounter Date: 04/03/2021   PT End of Session - 04/03/21 1054     Visit Number 4    Number of Visits 9    Date for PT Re-Evaluation 05/20/21    PT Start Time 1000    PT Stop Time 6384    PT Time Calculation (min) 45 min    Activity Tolerance Patient tolerated treatment well    Behavior During Therapy [facility] for tasks assessed/performed             Past Medical History:  Diagnosis Date   Abnormal glucose    Breast cancer ([place]) 03/2020   right breast IDC   Family history of breast cancer 04/03/2020   Family history of ovarian cancer 04/03/2020   History of radiation therapy    Right breast- 09/25/20-11/12/20- Dr. [name]   Hypertension    Hyperthyroidism    in the past   Hypothyroidism    Iron deficiency anemia    Palpitation    Personal history of chemotherapy    Spontaneous ecchymoses    Tachycardia    Vitamin D deficiency     Past Surgical History:  Procedure Laterality Date   BREAST BIOPSY     BREAST EXCISIONAL BIOPSY     BREAST LUMPECTOMY Right 08/20/2020   w/ rad w/ chemo   BREAST LUMPECTOMY WITH RADIOACTIVE SEED LOCALIZATION Right 08/20/2020   Procedure: RADIOACTIVE SEED GUIDED RIGHT BREAST LUMPECTOMY;  Surgeon: Coralie Keens, MD;  Location: [facility];  Service: General;  Laterality: Right;   CHOLECYSTECTOMY     COLONOSCOPY  05/18/2018   KNEE SURGERY     PORTACATH PLACEMENT Left 04/15/2020   Procedure: INSERTION PORT-A-CATH;  Surgeon: Coralie Keens, MD;  Location: [facility];  Service: General;  Laterality: Left;   RADIOACTIVE SEED GUIDED AXILLARY SENTINEL LYMPH NODE Right 08/20/2020   Procedure: RADIOACTIVE SEED TARGETED RIGHT  AXILLARY LYMPH NODE DISSECTION;  Surgeon: Coralie Keens, MD;  Location: [facility];  Service: General;  Laterality: Right;   TONSILLECTOMY      There were no vitals filed for this visit.   Subjective Assessment - 04/03/21 1009     Subjective I've been wearing the patch and it has helped. She notices the swelling has gone down.  She has been having some upset stomach after her injection. [facility]    Pertinent History Patient was diagnosed on 03/14/2020 with right grade 2 invasive ductal carcinoma breast cancer.  She underwent neoadjuvant chemo followed by a right lumpectomy and targeted node dissection (7 negative lymph nodes) on 08/20/2020. It is ER/PR negative and HER2 positive with a Ki67 of 30%. She has a positive axillary lymph node. Still has port on the left side for infusion for Herception, Projeta.    Currently in Pain? No/denies                               [facility] Adult PT Treatment/Exercise - 04/03/21 0001       Manual Therapy   Manual Therapy Manual Lymphatic Drainage (MLD)    Manual therapy comments gave pt small dotted peach  foam over white foam patch to wear at lateral breast/axilla inside bra in addition to the patch she is responding well to at inferior breasst    Manual Lymphatic Drainage (MLD) reviewed self techinque with hand over hand technique and mirror for visual cues. short neck on right side, did not do abdominal sequence as pt is having problems with upset stomach/diarrhea today. left axillary nodes and anteiror interaxillay anastamosis avoiding port on left side, right superior breast to left axilla, right inferior and lateral breast to latereral chest and back. Pt to sidleying with arm overhead and pillow to back for self instruction in axillary and lateral breast area, then posterior interaxillay anastamosis and trunk                     PT Education - 04/03/21 1054     Education  Details self MLD    Person(s) Educated Patient    Methods Explanation;Tactile cues    Comprehension Verbalized understanding;Returned demonstration;Need further instruction                 PT Long Term Goals - 04/03/21 1057       PT LONG TERM GOAL #1   Title Pt will decrease breast heaviness and discomfort by at least 50%    Time 8    Period Weeks    Status On-going      PT LONG TERM GOAL #2   Title Pt will be ind with self MLD to the Rt breast    Time 8    Period Weeks    Status On-going                   Plan - 04/03/21 1055     Clinical Impression Statement Pt is improving with decreased fibrosis in right breast at area of foam patch evident today.  Reinforced self MLD especilly to lateral and axillary area and added another foam patch to this area.  Pt is doing well with home follow up and anticipate she will continue to improve    Rehab Potential Excellent    PT Frequency 1x / week    PT Duration 8 weeks    PT Treatment/Interventions ADLs/Self Care Home Management;Therapeutic exercise;Patient/family education;Manual techniques;Taping    PT Next Visit Plan how is breast after self MD and use of foam x a few weeks?  Continue Rt axillary MFR/STM for decongestion and continue MLD to the Rt breast.  Cont every 3 month L-Dex screens for up to 2 years from her SLNB.    Consulted and Agree with Plan of Care Patient             Patient will benefit from skilled therapeutic intervention in order to improve the following deficits and impairments:  Postural dysfunction, Impaired UE functional use, Pain, Decreased knowledge of precautions, Increased edema  Visit Diagnosis: Aftercare following surgery for neoplasm  Malignant neoplasm of upper-outer quadrant of right breast in female, estrogen receptor negative ([place])  Disorder of the skin and subcutaneous tissue related to radiation, unspecified     Problem List Patient Active Problem List   Diagnosis Date  Noted   Port-A-Cath in place 05/07/2020   Menorrhagia 04/19/2020   Uterine leiomyoma 04/19/2020   Genetic testing 04/19/2020   Family history of ovarian cancer 04/03/2020   Family history of leukemia 04/03/2020   Family history of breast cancer 04/03/2020   Malignant neoplasm of upper-outer quadrant of right breast in female, estrogen receptor negative ([place]) 03/28/2020  Infiltrating ductal carcinoma of breast ([place]) 03/27/2020   Overweight (BMI 25.0-29.9) 12/05/2019   Essential hypertension, benign 05/29/2018   Iritis of left eye 05/29/2018   Iron deficiency anemia due to chronic blood loss 05/29/2018   Primary hypothyroidism 05/29/2018   Stress incontinence in female 02/16/2016   Donato Heinz. Owens Shark PT  Norwood Levo, PT 04/03/2021, 10:58 AM  [facility] @ [place], [place], [address] Phone: [phone]   Fax:  [phone]  Name: Dorothia Passmore MRN: [id number] Date of Birth: Nov 15, 1970

## 2021-04-08 ENCOUNTER — Encounter: Payer: Self-pay | Admitting: Rehabilitation

## 2021-04-08 ENCOUNTER — Ambulatory Visit: Admitting: Rehabilitation

## 2021-04-08 ENCOUNTER — Other Ambulatory Visit: Payer: Self-pay

## 2021-04-08 DIAGNOSIS — Z483 Aftercare following surgery for neoplasm: Secondary | ICD-10-CM | POA: Diagnosis not present

## 2021-04-08 DIAGNOSIS — C50411 Malignant neoplasm of upper-outer quadrant of right female breast: Secondary | ICD-10-CM

## 2021-04-08 DIAGNOSIS — L599 Disorder of the skin and subcutaneous tissue related to radiation, unspecified: Secondary | ICD-10-CM

## 2021-04-08 NOTE — Therapy (Signed)
[facility] @ [address] [place], [place], [address] Phone: [phone]   Fax:  [phone]  Physical Therapy Treatment  Patient Details  Name: Shannon Kane MRN: [id number] Date of Birth: 1971/01/28 Referring Provider (PT): Dr. Coralie Keens   Encounter Date: 04/08/2021   PT End of Session - 04/08/21 1148     Visit Number 5    Number of Visits 9    Date for PT Re-Evaluation 05/20/21    PT Start Time 1100    PT Stop Time 1146    PT Time Calculation (min) 46 min    Activity Tolerance Patient tolerated treatment well    Behavior During Therapy [facility] for tasks assessed/performed             Past Medical History:  Diagnosis Date   Abnormal glucose    Breast cancer ([place]) 03/2020   right breast IDC   Family history of breast cancer 04/03/2020   Family history of ovarian cancer 04/03/2020   History of radiation therapy    Right breast- 09/25/20-11/12/20- Dr. [name]   Hypertension    Hyperthyroidism    in the past   Hypothyroidism    Iron deficiency anemia    Palpitation    Personal history of chemotherapy    Spontaneous ecchymoses    Tachycardia    Vitamin D deficiency     Past Surgical History:  Procedure Laterality Date   BREAST BIOPSY     BREAST EXCISIONAL BIOPSY     BREAST LUMPECTOMY Right 08/20/2020   w/ rad w/ chemo   BREAST LUMPECTOMY WITH RADIOACTIVE SEED LOCALIZATION Right 08/20/2020   Procedure: RADIOACTIVE SEED GUIDED RIGHT BREAST LUMPECTOMY;  Surgeon: Coralie Keens, MD;  Location: [facility];  Service: General;  Laterality: Right;   CHOLECYSTECTOMY     COLONOSCOPY  05/18/2018   KNEE SURGERY     PORTACATH PLACEMENT Left 04/15/2020   Procedure: INSERTION PORT-A-CATH;  Surgeon: Coralie Keens, MD;  Location: [facility];  Service: General;  Laterality: Left;   RADIOACTIVE SEED GUIDED AXILLARY SENTINEL LYMPH NODE Right 08/20/2020   Procedure: RADIOACTIVE SEED TARGETED RIGHT  AXILLARY LYMPH NODE DISSECTION;  Surgeon: Coralie Keens, MD;  Location: [facility];  Service: General;  Laterality: Right;   TONSILLECTOMY      There were no vitals filed for this visit.   Subjective Assessment - 04/08/21 1103     Subjective Much better.    Pertinent History Patient was diagnosed on 03/14/2020 with right grade 2 invasive ductal carcinoma breast cancer.  She underwent neoadjuvant chemo followed by a right lumpectomy and targeted node dissection (7 negative lymph nodes) on 08/20/2020. It is ER/PR negative and HER2 positive with a Ki67 of 30%. She has a positive axillary lymph node. Still has port on the left side for infusion for Herception, Projeta.    Currently in Pain? No/denies                               [facility] Adult PT Treatment/Exercise - 04/08/21 0001       Manual Therapy   Myofascial Release to the Rt axilla    Manual Lymphatic Drainage (MLD) PT performed bil axillary nodes and anteiror interaxillary anastamosis avoiding port on left side, Rt inguinal nodes and Rt axillo inguinal pathway then right superior breast to left axilla, right inferior and lateral breast to latereral chest and back. Pt to sidleying with arm  overhead and pillow to back for self instruction in axillary and lateral breast area, then posterior interaxillay anastamosis and trunk                          PT Long Term Goals - 04/03/21 1057       PT LONG TERM GOAL #1   Title Pt will decrease breast heaviness and discomfort by at least 50%    Time 8    Period Weeks    Status On-going      PT LONG TERM GOAL #2   Title Pt will be ind with self MLD to the Rt breast    Time 8    Period Weeks    Status On-going                   Plan - 04/08/21 1148     Clinical Impression Statement Pt is doing well.  Continued with POC still with breast softening evident with MLD.    PT Next Visit Plan Continue Rt axillary MFR/STM for decongestion and continue  MLD to the Rt breast.  Cont every 3 month L-Dex screens for up to 2 years from her SLNB.             Patient will benefit from skilled therapeutic intervention in order to improve the following deficits and impairments:     Visit Diagnosis: Aftercare following surgery for neoplasm  Malignant neoplasm of upper-outer quadrant of right breast in female, estrogen receptor negative ([place])  Disorder of the skin and subcutaneous tissue related to radiation, unspecified     Problem List Patient Active Problem List   Diagnosis Date Noted   Port-A-Cath in place 05/07/2020   Menorrhagia 04/19/2020   Uterine leiomyoma 04/19/2020   Genetic testing 04/19/2020   Family history of ovarian cancer 04/03/2020   Family history of leukemia 04/03/2020   Family history of breast cancer 04/03/2020   Malignant neoplasm of upper-outer quadrant of right breast in female, estrogen receptor negative ([place]) 03/28/2020   Infiltrating ductal carcinoma of breast ([place]) 03/27/2020   Overweight (BMI 25.0-29.9) 12/05/2019   Essential hypertension, benign 05/29/2018   Iritis of left eye 05/29/2018   Iron deficiency anemia due to chronic blood loss 05/29/2018   Primary hypothyroidism 05/29/2018   Stress incontinence in female 02/16/2016    Stark Bray, PT 04/08/2021, 11:49 AM  [facility] @ [place], [place], [address] Phone: [phone]   Fax:  [phone]  Name: Shannon Kane MRN: [id number] Date of Birth: 1970/12/20

## 2021-04-09 ENCOUNTER — Encounter: Payer: Self-pay | Admitting: Hematology and Oncology

## 2021-04-14 NOTE — Assessment & Plan Note (Signed)
03/28/2020:Patient palpated a right breast mass x2 wks. Mammogram and US showed a 2.0cm mass at the 10 o'clock position in the right breast and two enlarged right axillary lymph nodes, 2.0cm and 1.7cm. 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 Satellite lesion biopsy: DCIS ER 0%, PR 0%  Treatment plan: 1. Neoadjuvant chemotherapy with TCH Perjeta 6 cycles followed by HerceptinPerjeta maintenance for 1 year 2.08/20/2020:Right lumpectomy Shannon Kane): no residual carcinoma, 7 right axillary lymph nodes negative for carcinoma. 3. Adjuvant radiation:09/26/2020-11/12/2020 ----------------------------------------------------------------------------------------------------------------------------------------- Current treatment:Completed 6 cycles of TCH Perjeta, continue with Herceptin Perjeta maintenance  Echocardiogram5/07/2020: EF 60 to 65%  Severe leg pains: Possibly due to plantar fasciitis. Nail changes: Her big toenail appears to be getting ready to come out. Insect bites: Probably ant bites. Anastrozole toxicities: Joint stiffness which are improved with taking half a tablet daily. Her daughter is a Equities trader at [employer]   Return to clinic in 3 weeks for Herceptin Perjeta and in 6 weeks for her last treatment.

## 2021-04-14 NOTE — Progress Notes (Signed)
Patient Care Team: [name], MD as PCP - General (Internal Medicine) Rockwell Germany, RN as Oncology Nurse Navigator Mauro Kaufmann, RN as Oncology Nurse Navigator Coralie Keens, MD as Consulting Physician (General Surgery) Nicholas Lose, MD as Consulting Physician (Hematology and Oncology) [name], MD as Consulting Physician (Radiation Oncology)  DIAGNOSIS:    ICD-10-CM   1. Malignant neoplasm of upper-outer quadrant of right breast in female, estrogen receptor negative ([place])  C50.411    Z17.1       SUMMARY OF ONCOLOGIC HISTORY: Oncology History  Malignant neoplasm of upper-outer quadrant of right breast in female, estrogen receptor negative ([place])  03/28/2020 Initial Diagnosis   Patient palpated a right breast mass x2 wks. Mammogram and US showed a 2.0cm mass at the 10 o'clock position in the right breast and two enlarged right axillary lymph nodes, 2.0cm and 1.7cm. 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   04/16/2020 - 07/30/2020 Chemotherapy   TCHP x6 cycles, pathologic complete response, Herceptin Perjeta maintenance    04/18/2020 Genetic Testing   Negative genetic testing: no pathogenic variants detected in [facility] Multi-Cancer Panel.  Variants of uncertain significance detected in CDH1 at c.1289T>G (p.Val430Gly) and EGFR at c.2873G>A (p.Arg958His).  The report date is April 18, 2020.   The Multi-Cancer Panel offered by [facility] includes sequencing and/or deletion duplication testing of the following 85 genes: AIP, ALK, APC, ATM, AXIN2,BAP1,  BARD1, BLM, BMPR1A, BRCA1, BRCA2, BRIP1, CASR, CDC73, CDH1, CDK4, CDKN1B, CDKN1C, CDKN2A (p14ARF), CDKN2A (p16INK4a), CEBPA, CHEK2, CTNNA1, DICER1, DIS3L2, EGFR, EPCAM (Deletion/duplication testing only), FH, FLCN, GATA2, GPC3, GREM1 (Promoter region deletion/duplication testing only), HOXB13 (c.251G>A, p.Gly84Glu), HRAS, KIT, MAX, MEN1, MET, MITF (c.952G>A,  p.Glu318Lys variant only), MLH1, MSH2, MSH3, MSH6, MUTYH, NBN, NF1, NF2, NTHL1, PALB2, PDGFRA, PHOX2B, PMS2, POLD1, POLE, POT1, PRKAR1A, PTCH1, PTEN, RAD50, RAD51C, RAD51D, RB1, RECQL4, RET, RNF43, RUNX1, SDHAF2, SDHA (sequence changes only), SDHB, SDHC, SDHD, SMAD4, SMARCA4, SMARCB1, SMARCE1, STK11, SUFU, TERC, TERT, TMEM127, TP53, TSC1, TSC2, VHL, WRN and WT1.    08/20/2020 Surgery   Right lumpectomy [name]): no residual carcinoma, 7 right axillary lymph nodes negative for carcinoma.   09/10/2020 -  Chemotherapy   Patient is on Treatment Plan : BREAST Trastuzumab + Pertuzumab q21d     09/26/2020 - 11/12/2020 Radiation Therapy   Adjuvant radiation     CHIEF COMPLIANT:  Herceptin Perjeta  INTERVAL HISTORY: Shannon Kane is a 50 y.o. with above-mentioned history of right breast cancer treated with lumpectomy, radiation, and neoadjuvant chemotherapy with [place], currently on Herceptin Perjeta maintenance. She presents to the clinic today for treatment.  She is taking letrozole half a tablet every other day and appears to be tolerating it reasonably well.  ALLERGIES:  is allergic to amoxicillin, ciprofloxacin, and penicillins.  MEDICATIONS:  Current Outpatient Medications  Medication Sig Dispense Refill   Cholecalciferol (VITAMIN D-3 PO) Take 5,000 Units by mouth daily.     letrozole (FEMARA) 2.5 MG tablet Take 0.5 tablets (1.25 mg total) by mouth daily. 45 tablet 3   lidocaine-prilocaine (EMLA) cream Apply 1 application topically as needed.     MAGNESIUM CITRATE PO Take 500 mcg by mouth at bedtime as needed (sleep). Only when having trouble sleeping     SYNTHROID 75 MCG tablet Take 1 tablet (75 mcg total) by mouth daily before breakfast. 90 tablet 1   valsartan (DIOVAN) 160 MG tablet Take 1 tablet (160 mg total) by mouth  daily. 90 tablet 2   vitamin B-12 (CYANOCOBALAMIN) 1000 MCG tablet Take 1,000 mcg by mouth daily.     No current facility-administered medications for this visit.     PHYSICAL EXAMINATION: ECOG PERFORMANCE STATUS: 1 - Symptomatic but completely ambulatory  Vitals:   04/15/21 0953  BP: (!) 179/110  Pulse: 87  Resp: 18  Temp: (!) 97.5 F (36.4 C)  SpO2: 97%   Filed Weights   04/15/21 0953  Weight: 191 lb (86.6 kg)    LABORATORY DATA:  I have reviewed the data as listed CMP Latest Ref Rng & Units 03/04/2021 01/21/2021 12/10/2020  Glucose 70 - 99 mg/dL 103(H) 98 88  BUN 6 - 20 mg/dL 11 11 10   Creatinine 0.44 - 1.00 mg/dL 0.79 0.85 0.83  Sodium 135 - 145 mmol/L 143 142 142  Potassium 3.5 - 5.1 mmol/L 3.8 3.7 3.7  Chloride 98 - 111 mmol/L 108 108 107  CO2 22 - 32 mmol/L 25 25 26   Calcium 8.9 - 10.3 mg/dL 9.3 9.1 9.5  Total Protein 6.5 - 8.1 g/dL 7.4 7.3 7.8  Total Bilirubin 0.3 - 1.2 mg/dL 0.5 0.4 0.6  Alkaline Phos 38 - 126 U/L 99 105 87  AST 15 - 41 U/L 21 25 32  ALT 0 - 44 U/L 18 25 22     Lab Results  Component Value Date   WBC 5.1 04/15/2021   HGB 12.8 04/15/2021   HCT 36.7 04/15/2021   MCV 91.3 04/15/2021   PLT 201 04/15/2021   NEUTROABS 3.6 04/15/2021    ASSESSMENT & PLAN:  Malignant neoplasm of upper-outer quadrant of right breast in female, estrogen receptor negative ([place]) 03/28/2020:Patient palpated a right breast mass x2 wks. Mammogram and US showed a 2.0cm mass at the 10 o'clock position in the right breast and two enlarged right axillary lymph nodes, 2.0cm and 1.7cm. 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 Satellite lesion biopsy: DCIS ER 0%, PR 0%   Treatment plan: 1. Neoadjuvant chemotherapy with TCH Perjeta 6 cycles followed by Herceptin Perjeta maintenance for 1 year 2. 08/20/2020:Right lumpectomy [name]): no residual carcinoma, 7 right axillary lymph nodes negative for carcinoma. 3. Adjuvant radiation: 09/26/2020-11/12/2020 4.  Antiestrogen therapy with letrozole (ER 10% weakly positive)  ----------------------------------------------------------------------------------------------------------------------------------------- Current treatment: Completed 6 cycles of TCH Perjeta, continue with Herceptin Perjeta maintenance   Echocardiogram 09/17/2020: EF 60 to 65% letrozole toxicities: She is able to tolerate half a tablet every other day very well  Nail changes: Her big toenail appears to be getting ready to come out.  Her daughter is a Equities trader at [employer]   Return to clinic in 3 months for survivorship care plan visit      No orders of the defined types were placed in this encounter.  The patient has a good understanding of the overall plan. she agrees with it. she will call with any problems that may develop before the next visit here.  Total time spent: 30 mins including face to face time and time spent for planning, charting and coordination of care  Rulon Eisenmenger, MD, MPH 04/15/2021  I, Thana Ates, am acting as scribe for Dr. Nicholas Lose.  I have reviewed the above documentation for accuracy and completeness, and I agree with the above.

## 2021-04-15 ENCOUNTER — Inpatient Hospital Stay

## 2021-04-15 ENCOUNTER — Other Ambulatory Visit: Payer: Self-pay

## 2021-04-15 ENCOUNTER — Telehealth: Payer: Self-pay | Admitting: Hematology and Oncology

## 2021-04-15 ENCOUNTER — Encounter: Payer: Self-pay | Admitting: *Deleted

## 2021-04-15 ENCOUNTER — Inpatient Hospital Stay (HOSPITAL_BASED_OUTPATIENT_CLINIC_OR_DEPARTMENT_OTHER): Admitting: Hematology and Oncology

## 2021-04-15 VITALS — BP 165/110 | HR 95 | Resp 17

## 2021-04-15 DIAGNOSIS — Z483 Aftercare following surgery for neoplasm: Secondary | ICD-10-CM | POA: Diagnosis not present

## 2021-04-15 DIAGNOSIS — C50411 Malignant neoplasm of upper-outer quadrant of right female breast: Secondary | ICD-10-CM | POA: Diagnosis not present

## 2021-04-15 DIAGNOSIS — Z171 Estrogen receptor negative status [ER-]: Secondary | ICD-10-CM

## 2021-04-15 DIAGNOSIS — Z95828 Presence of other vascular implants and grafts: Secondary | ICD-10-CM

## 2021-04-15 LAB — CBC WITH DIFFERENTIAL (CANCER CENTER ONLY)
Abs Immature Granulocytes: 0.01 10*3/uL (ref 0.00–0.07)
Basophils Absolute: 0 10*3/uL (ref 0.0–0.1)
Basophils Relative: 1 %
Eosinophils Absolute: 0.1 10*3/uL (ref 0.0–0.5)
Eosinophils Relative: 2 %
HCT: 36.7 % (ref 36.0–46.0)
Hemoglobin: 12.8 g/dL (ref 12.0–15.0)
Immature Granulocytes: 0 %
Lymphocytes Relative: 17 %
Lymphs Abs: 0.9 10*3/uL (ref 0.7–4.0)
MCH: 31.8 pg (ref 26.0–34.0)
MCHC: 34.9 g/dL (ref 30.0–36.0)
MCV: 91.3 fL (ref 80.0–100.0)
Monocytes Absolute: 0.5 10*3/uL (ref 0.1–1.0)
Monocytes Relative: 10 %
Neutro Abs: 3.6 10*3/uL (ref 1.7–7.7)
Neutrophils Relative %: 70 %
Platelet Count: 201 10*3/uL (ref 150–400)
RBC: 4.02 MIL/uL (ref 3.87–5.11)
RDW: 13.5 % (ref 11.5–15.5)
WBC Count: 5.1 10*3/uL (ref 4.0–10.5)
nRBC: 0 % (ref 0.0–0.2)

## 2021-04-15 LAB — CMP (CANCER CENTER ONLY)
ALT: 20 U/L (ref 0–44)
AST: 21 U/L (ref 15–41)
Albumin: 4 g/dL (ref 3.5–5.0)
Alkaline Phosphatase: 91 U/L (ref 38–126)
Anion gap: 5 (ref 5–15)
BUN: 11 mg/dL (ref 6–20)
CO2: 28 mmol/L (ref 22–32)
Calcium: 8.9 mg/dL (ref 8.9–10.3)
Chloride: 107 mmol/L (ref 98–111)
Creatinine: 0.77 mg/dL (ref 0.44–1.00)
GFR, Estimated: >60 mL/min (ref >60)
Glucose, Bld: 96 mg/dL (ref 70–99)
Potassium: 3.2 mmol/L — ABNORMAL LOW (ref 3.5–5.1)
Sodium: 140 mmol/L (ref 135–145)
Total Bilirubin: 0.6 mg/dL (ref 0.3–1.2)
Total Protein: 7.6 g/dL (ref 6.5–8.1)

## 2021-04-15 MED ORDER — ACETAMINOPHEN 325 MG PO TABS
650.0000 mg | ORAL_TABLET | Freq: Once | ORAL | Status: AC
  Administered 2021-04-15: 650 mg via ORAL

## 2021-04-15 MED ORDER — TRASTUZUMAB-DKST CHEMO 150 MG IV SOLR
6.0000 mg/kg | Freq: Once | INTRAVENOUS | Status: AC
  Administered 2021-04-15: 525 mg via INTRAVENOUS
  Filled 2021-04-15: qty 25

## 2021-04-15 MED ORDER — SODIUM CHLORIDE 0.9% FLUSH
10.0000 mL | INTRAVENOUS | Status: DC | PRN
  Administered 2021-04-15: 10 mL

## 2021-04-15 MED ORDER — SODIUM CHLORIDE 0.9 % IV SOLN: Freq: Once | INTRAVENOUS | Status: AC

## 2021-04-15 MED ORDER — SODIUM CHLORIDE 0.9% FLUSH
10.0000 mL | Freq: Once | INTRAVENOUS | Status: AC
Start: 2021-04-15 — End: 2021-04-15
  Administered 2021-04-15: 10 mL

## 2021-04-15 MED ORDER — LETROZOLE 2.5 MG PO TABS: 1.2500 mg | ORAL_TABLET | ORAL | 3 refills | Status: DC

## 2021-04-15 MED ORDER — SODIUM CHLORIDE 0.9 % IV SOLN
420.0000 mg | Freq: Once | INTRAVENOUS | Status: AC
  Administered 2021-04-15: 420 mg via INTRAVENOUS
  Filled 2021-04-15: qty 14

## 2021-04-15 MED ORDER — LETROZOLE 2.5 MG PO TABS: 2.5000 mg | ORAL_TABLET | Freq: Every day | ORAL | 0 refills | Status: DC

## 2021-04-15 MED ORDER — HEPARIN SOD (PORK) LOCK FLUSH 100 UNIT/ML IV SOLN
500.0000 [IU] | Freq: Once | INTRAVENOUS | Status: AC | PRN
  Administered 2021-04-15: 500 [IU]

## 2021-04-15 MED ORDER — DIPHENHYDRAMINE HCL 25 MG PO CAPS
50.0000 mg | ORAL_CAPSULE | Freq: Once | ORAL | Status: AC
  Administered 2021-04-15: 50 mg via ORAL

## 2021-04-15 NOTE — Telephone Encounter (Signed)
Scheduled appointment per 11/29 los. Left message.

## 2021-04-15 NOTE — Progress Notes (Signed)
Ok to treat with BP 163/103 per Dr Lindi Adie

## 2021-04-15 NOTE — Patient Instructions (Signed)
[facility] ONCOLOGY  Discharge Instructions: Thank you for choosing [facility] to provide your oncology and hematology care.   If you have a lab appointment with the [facility], please go directly to the [facility] and check in at the registration area.   Wear comfortable clothing and clothing appropriate for easy access to any Portacath or PICC line.   We strive to give you quality time with your provider. You may need to reschedule your appointment if you arrive late (15 or more minutes).  Arriving late affects you and other patients whose appointments are after yours.  Also, if you miss three or more appointments without notifying the office, you may be dismissed from the clinic at the provider's discretion.      For prescription refill requests, have your pharmacy contact our office and allow 72 hours for refills to be completed.    Today you received the following chemotherapy and/or immunotherapy agents: trastuzumab and pertuzumab      To help prevent nausea and vomiting after your treatment, we encourage you to take your nausea medication as directed.  BELOW ARE SYMPTOMS THAT SHOULD BE REPORTED IMMEDIATELY: *FEVER GREATER THAN 100.4 F (38 C) OR HIGHER *CHILLS OR SWEATING *NAUSEA AND VOMITING THAT IS NOT CONTROLLED WITH YOUR NAUSEA MEDICATION *UNUSUAL SHORTNESS OF BREATH *UNUSUAL BRUISING OR BLEEDING *URINARY PROBLEMS (pain or burning when urinating, or frequent urination) *BOWEL PROBLEMS (unusual diarrhea, constipation, pain near the anus) TENDERNESS IN MOUTH AND THROAT WITH OR WITHOUT PRESENCE OF ULCERS (sore throat, sores in mouth, or a toothache) UNUSUAL RASH, SWELLING OR PAIN  UNUSUAL VAGINAL DISCHARGE OR ITCHING   Items with * indicate a potential emergency and should be followed up as soon as possible or go to the Emergency Department if any problems should occur.  Please show the CHEMOTHERAPY ALERT CARD or IMMUNOTHERAPY ALERT  CARD at check-in to the Emergency Department and triage nurse.  Should you have questions after your visit or need to cancel or reschedule your appointment, please contact [facility]  Dept: [phone]  and follow the prompts.  Office hours are 8:00 a.m. to 4:30 p.m. Monday - Friday. Please note that voicemails left after 4:00 p.m. may not be returned until the following business day.  We are closed weekends and major holidays. You have access to a nurse at all times for urgent questions. Please call the main number to the clinic Dept: [phone] and follow the prompts.   For any non-urgent questions, you may also contact your provider using MyChart. We now offer e-Visits for anyone 16 and older to request care online for non-urgent symptoms. For details visit [email or web address].   Also download the MyChart app! Go to the app store, search "MyChart", open the app, select [facility], and log in with your MyChart username and password.  Due to Covid, a mask is required upon entering the hospital/clinic. If you do not have a mask, one will be given to you upon arrival. For doctor visits, patients may have 1 support person aged 29 or older with them. For treatment visits, patients cannot have anyone with them due to current Covid guidelines and our immunocompromised population.

## 2021-04-16 ENCOUNTER — Ambulatory Visit (INDEPENDENT_AMBULATORY_CARE_PROVIDER_SITE_OTHER): Admitting: Internal Medicine

## 2021-04-16 ENCOUNTER — Encounter: Payer: Self-pay | Admitting: Internal Medicine

## 2021-04-16 VITALS — BP 130/74 | HR 70 | Temp 98.5°F | Ht 67.8 in | Wt 192.2 lb

## 2021-04-16 DIAGNOSIS — I10 Essential (primary) hypertension: Secondary | ICD-10-CM | POA: Diagnosis not present

## 2021-04-16 DIAGNOSIS — J0100 Acute maxillary sinusitis, unspecified: Secondary | ICD-10-CM | POA: Diagnosis not present

## 2021-04-16 DIAGNOSIS — H5789 Other specified disorders of eye and adnexa: Secondary | ICD-10-CM

## 2021-04-16 DIAGNOSIS — E663 Overweight: Secondary | ICD-10-CM

## 2021-04-16 DIAGNOSIS — E876 Hypokalemia: Secondary | ICD-10-CM | POA: Insufficient documentation

## 2021-04-16 DIAGNOSIS — E039 Hypothyroidism, unspecified: Secondary | ICD-10-CM | POA: Diagnosis not present

## 2021-04-16 MED ORDER — SYNTHROID 75 MCG PO TABS: 75.0000 ug | ORAL_TABLET | Freq: Every day | ORAL | 1 refills | Status: DC

## 2021-04-16 MED ORDER — DOXYCYCLINE HYCLATE 100 MG PO TABS: 100.0000 mg | ORAL_TABLET | Freq: Two times a day (BID) | ORAL | 0 refills | Status: DC

## 2021-04-16 NOTE — Progress Notes (Signed)
I,Tianna Badgett,acting as a Education administrator for [name], MD.,have documented all relevant documentation on the behalf of [name], MD,as directed by  [name], MD while in the presence of [name], MD.  This visit occurred during the SARS-CoV-2 public health emergency.  Safety protocols were in place, including screening questions prior to the visit, additional usage of staff PPE, and extensive cleaning of exam room while observing appropriate contact time as indicated for disinfecting solutions.  Subjective:     Patient ID: Shannon Kane , female    DOB: December 25, 1970 , 50 y.o.   MRN: [id number]   Chief Complaint  Patient presents with   Hypertension    HPI  The patient is here today for a follow-up for her blood pressure.  She reports compliance with meds. She denies having headaches, chest pain and shortness of breath.   Hypertension This is a chronic problem. The current episode started more than 1 year ago. The problem has been gradually improving since onset. The problem is controlled. Pertinent negatives include no blurred vision, chest pain, orthopnea, palpitations or shortness of breath. Past treatments include ACE inhibitors. The current treatment provides moderate improvement. Identifiable causes of hypertension include a thyroid problem.  Thyroid Problem Presents for follow-up visit. Patient reports no cold intolerance, leg swelling, palpitations or weight gain. The symptoms have been stable.    Past Medical History:  Diagnosis Date   Abnormal glucose    Breast cancer ([facility]) 03/2020   right breast IDC   Family history of breast cancer 04/03/2020   Family history of ovarian cancer 04/03/2020   History of radiation therapy    Right breast- 09/25/20-11/12/20- Dr. [name]   Hypertension    Hyperthyroidism    in the past   Hypothyroidism    Iron deficiency anemia    Palpitation    Personal history of chemotherapy    Spontaneous ecchymoses     Tachycardia    Vitamin D deficiency      Family History  Problem Relation Age of Onset   Diabetes Mother    Hypertension Mother    Leukemia Father        dx > 83   Ovarian cancer Maternal Grandmother 21   Bone cancer Paternal Grandfather        dx unknown age   Cancer Maternal Uncle        Mouth and throad; dx > 51   Breast cancer Other        MGM's niece, dx unknown age   Cancer Maternal Uncle        unknown type; dx > 50     Current Outpatient Medications:    Cholecalciferol (VITAMIN D-3 PO), Take 5,000 Units by mouth daily., Disp: , Rfl:    doxycycline (VIBRA-TABS) 100 MG tablet, Take 1 tablet (100 mg total) by mouth 2 (two) times daily., Disp: 20 tablet, Rfl: 0   letrozole (FEMARA) 2.5 MG tablet, Take 1 tablet (2.5 mg total) by mouth daily., Disp: 90 tablet, Rfl: 0   lidocaine-prilocaine (EMLA) cream, Apply 1 application topically as needed. (Patient not taking: Reported on 04/22/2021), Disp: , Rfl:    MAGNESIUM CITRATE PO, Take 500 mcg by mouth at bedtime as needed (sleep). Only when having trouble sleeping, Disp: , Rfl:    valsartan (DIOVAN) 160 MG tablet, Take 1 tablet (160 mg total) by mouth daily., Disp: 90 tablet, Rfl: 2   vitamin B-12 (CYANOCOBALAMIN) 1000 MCG tablet, Take 1,000 mcg by mouth  daily., Disp: , Rfl:    amLODipine (NORVASC) 2.5 MG tablet, Take 1 tablet (2.5 mg total) by mouth daily., Disp: 90 tablet, Rfl: 3   SYNTHROID 75 MCG tablet, Take 1 tablet (75 mcg total) by mouth daily before breakfast., Disp: 90 tablet, Rfl: 1   Allergies  Allergen Reactions   Amoxicillin     Tolerates ANCEF   Ciprofloxacin Itching   Penicillins Hives    Tolerates ANCEF      Review of Systems  Constitutional: Negative.  Negative for weight gain.  HENT:  Positive for congestion and sinus pressure. Negative for tinnitus.   Eyes:  Positive for redness. Negative for blurred vision.       She denies visual disturbance and headaches. Reports h/o iritis. Has drops she can use for  her red eye.   Respiratory: Negative.  Negative for shortness of breath.   Cardiovascular: Negative.  Negative for chest pain, palpitations and orthopnea.  Gastrointestinal: Negative.   Endocrine: Negative for cold intolerance.  Neurological: Negative.     Today's Vitals   04/16/21 1542  BP: 130/74  Pulse: 70  Temp: 98.5 F (36.9 C)  TempSrc: Oral  Weight: 192 lb 3.2 oz (87.2 kg)  Height: 5' 7.8" (1.722 m)   Body mass index is 29.4 kg/m.  Wt Readings from Last 3 Encounters:  04/30/21 193 lb 5.5 oz (87.7 kg)  04/22/21 191 lb (86.6 kg)  04/16/21 192 lb 3.2 oz (87.2 kg)   BP Readings from Last 3 Encounters:  04/30/21 (!) 141/86  04/22/21 (!) 140/98  04/16/21 130/74     Objective:  Physical Exam Vitals and nursing note reviewed.  Constitutional:      Appearance: Normal appearance.  HENT:     Head: Normocephalic and atraumatic.     Comments: Maxillary sinuses tender to percussion    Nose:     Comments: Masked     Mouth/Throat:     Comments: Masked  Eyes:     Extraocular Movements: Extraocular movements intact.     Conjunctiva/sclera:     Left eye: Left conjunctiva is injected.  Cardiovascular:     Rate and Rhythm: Normal rate and regular rhythm.     Heart sounds: Normal heart sounds.  Pulmonary:     Effort: Pulmonary effort is normal.     Breath sounds: Normal breath sounds.  Musculoskeletal:     Cervical back: Normal range of motion.  Skin:    General: Skin is warm.  Neurological:     General: No focal deficit present.     Mental Status: She is alert.  Psychiatric:        Mood and Affect: Mood normal.        Behavior: Behavior normal.        Assessment And Plan:     1. Essential hypertension, benign Comments: Chronic, controlled. Encouraged to continue checking her BP readings at home. Importance of salt restriction stressed to patient.   2. Primary hypothyroidism Comments: Chronic, labs from August 2022 reviewed. She will c/w Synthroid 23mg  daily.  3. Redness of eye, left Comments: Pt has h/o iritis. She is using drops from previous episode. She is advised to contact her Ophthalmologist for further evaluation.   4. Acute non-recurrent maxillary sinusitis Comments: I will send rx doxy, encouraged to take full abx course.   5. Hypokalemia Comments: BMP results reviewed from 04/15/21, she is encouraged to increase intake of potassium rich foods.  - BMP8+EGFR; Future  6. Overweight (BMI 25.0-29.9)  Comments: She is encouraged to strive to exercise at least 30 minutes five days per week.    Patient was given opportunity to ask questions. Patient verbalized understanding of the plan and was able to repeat key elements of the plan. All questions were answered to their satisfaction.    I, [name], MD, have reviewed all documentation for this visit. The documentation on 04/16/21 for the exam, diagnosis, procedures, and orders are all accurate and complete.   IF YOU HAVE BEEN REFERRED TO A SPECIALIST, IT MAY TAKE 1-2 WEEKS TO SCHEDULE/PROCESS THE REFERRAL. IF YOU HAVE NOT HEARD FROM US/SPECIALIST IN TWO WEEKS, PLEASE GIVE [place] A CALL AT [phone] X [phone].   THE PATIENT IS ENCOURAGED TO PRACTICE SOCIAL DISTANCING DUE TO THE COVID-19 PANDEMIC.

## 2021-04-16 NOTE — Patient Instructions (Signed)

## 2021-04-17 ENCOUNTER — Other Ambulatory Visit: Payer: Self-pay

## 2021-04-17 ENCOUNTER — Encounter: Payer: Self-pay | Admitting: Rehabilitation

## 2021-04-17 ENCOUNTER — Ambulatory Visit: Attending: Hematology and Oncology | Admitting: Rehabilitation

## 2021-04-17 DIAGNOSIS — Z171 Estrogen receptor negative status [ER-]: Secondary | ICD-10-CM | POA: Insufficient documentation

## 2021-04-17 DIAGNOSIS — C50411 Malignant neoplasm of upper-outer quadrant of right female breast: Secondary | ICD-10-CM | POA: Insufficient documentation

## 2021-04-17 DIAGNOSIS — Z483 Aftercare following surgery for neoplasm: Secondary | ICD-10-CM | POA: Insufficient documentation

## 2021-04-17 DIAGNOSIS — L599 Disorder of the skin and subcutaneous tissue related to radiation, unspecified: Secondary | ICD-10-CM | POA: Diagnosis present

## 2021-04-17 NOTE — Therapy (Signed)
[facility] @ [address] [place], [place], [address] Phone: [phone]   Fax:  [phone]  Physical Therapy Treatment  Patient Details  Name: Shannon Kane MRN: [id number] Date of Birth: 1970/10/20 Referring Provider (PT): Dr. Coralie Keens   Encounter Date: 04/17/2021   PT End of Session - 04/17/21 0845     Visit Number 6    Number of Visits 9    Date for PT Re-Evaluation 05/20/21    PT Start Time 0801    PT Stop Time 0845    PT Time Calculation (min) 44 min    Activity Tolerance Patient tolerated treatment well    Behavior During Therapy [facility] for tasks assessed/performed             Past Medical History:  Diagnosis Date   Abnormal glucose    Breast cancer ([facility]) 03/2020   right breast IDC   Family history of breast cancer 04/03/2020   Family history of ovarian cancer 04/03/2020   History of radiation therapy    Right breast- 09/25/20-11/12/20- Dr. [name]   Hypertension    Hyperthyroidism    in the past   Hypothyroidism    Iron deficiency anemia    Palpitation    Personal history of chemotherapy    Spontaneous ecchymoses    Tachycardia    Vitamin D deficiency     Past Surgical History:  Procedure Laterality Date   BREAST BIOPSY     BREAST EXCISIONAL BIOPSY     BREAST LUMPECTOMY Right 08/20/2020   w/ rad w/ chemo   BREAST LUMPECTOMY WITH RADIOACTIVE SEED LOCALIZATION Right 08/20/2020   Procedure: RADIOACTIVE SEED GUIDED RIGHT BREAST LUMPECTOMY;  Surgeon: Coralie Keens, MD;  Location: [facility];  Service: General;  Laterality: Right;   CHOLECYSTECTOMY     COLONOSCOPY  05/18/2018   KNEE SURGERY     PORTACATH PLACEMENT Left 04/15/2020   Procedure: INSERTION PORT-A-CATH;  Surgeon: Coralie Keens, MD;  Location: [facility];  Service: General;  Laterality: Left;   RADIOACTIVE SEED GUIDED AXILLARY SENTINEL LYMPH NODE Right 08/20/2020   Procedure: RADIOACTIVE SEED TARGETED RIGHT  AXILLARY LYMPH NODE DISSECTION;  Surgeon: Coralie Keens, MD;  Location: [facility];  Service: General;  Laterality: Right;   TONSILLECTOMY      There were no vitals filed for this visit.   Subjective Assessment - 04/17/21 0804     Subjective I had my last treatment yesterday.  I have some low potassium from all my loose bowels and high BP I am supposed to use pedialyte    Pertinent History Patient was diagnosed on 03/14/2020 with right grade 2 invasive ductal carcinoma breast cancer.  She underwent neoadjuvant chemo followed by a right lumpectomy and targeted node dissection (7 negative lymph nodes) on 08/20/2020. It is ER/PR negative and HER2 positive with a Ki67 of 30%. She has a positive axillary lymph node. Still has port on the left side for infusion for Herception, Projeta.    Currently in Pain? No/denies                               [facility] Adult PT Treatment/Exercise - 04/17/21 0001       Manual Therapy   Myofascial Release to the Rt axilla    Manual Lymphatic Drainage (MLD) PT performed bil axillary nodes and anteiror interaxillary anastamosis avoiding port on left side, Rt inguinal nodes and Rt axillo  inguinal pathway then right superior breast to left axilla, right inferior and lateral breast to latereral chest and back. Pt to sidleying with arm overhead and pillow to back for self instruction in axillary and lateral breast area, then posterior interaxillay anastamosis and trunk                          PT Long Term Goals - 04/03/21 1057       PT LONG TERM GOAL #1   Title Pt will decrease breast heaviness and discomfort by at least 50%    Time 8    Period Weeks    Status On-going      PT LONG TERM GOAL #2   Title Pt will be ind with self MLD to the Rt breast    Time 8    Period Weeks    Status On-going                   Plan - 04/17/21 0845     Clinical Impression Statement Pt has now finished chemotherapy infusions and  breast cancer treatments.  Pt is doing well and continues to improve with STm and MLD.  Reports no cramping lately.    PT Frequency 1x / week    PT Duration 8 weeks    PT Treatment/Interventions ADLs/Self Care Home Management;Therapeutic exercise;Patient/family education;Manual techniques;Taping    PT Next Visit Plan any swelling on the flight?  review self MLD, Continue Rt axillary MFR/STM for decongestion and continue MLD to the Rt breast.  Cont every 3 month L-Dex screens for up to 2 years from her SLNB.             Patient will benefit from skilled therapeutic intervention in order to improve the following deficits and impairments:     Visit Diagnosis: Aftercare following surgery for neoplasm  Malignant neoplasm of upper-outer quadrant of right breast in female, estrogen receptor negative ([place])  Disorder of the skin and subcutaneous tissue related to radiation, unspecified     Problem List Patient Active Problem List   Diagnosis Date Noted   Hypokalemia 04/16/2021   Port-A-Cath in place 05/07/2020   Menorrhagia 04/19/2020   Uterine leiomyoma 04/19/2020   Genetic testing 04/19/2020   Family history of ovarian cancer 04/03/2020   Family history of leukemia 04/03/2020   Family history of breast cancer 04/03/2020   Malignant neoplasm of upper-outer quadrant of right breast in female, estrogen receptor negative ([place]) 03/28/2020   Infiltrating ductal carcinoma of breast ([place]) 03/27/2020   Overweight (BMI 25.0-29.9) 12/05/2019   Essential hypertension, benign 05/29/2018   Iritis of left eye 05/29/2018   Iron deficiency anemia due to chronic blood loss 05/29/2018   Primary hypothyroidism 05/29/2018   Stress incontinence in female 02/16/2016    Stark Bray, PT 04/17/2021, 8:46 AM  [facility] @ [address] [place], [place], [address] Phone: [phone]   Fax:  [phone]  Name: Shannon Kane MRN: [id number] Date  of Birth: 22-Mar-1971

## 2021-04-18 ENCOUNTER — Encounter: Payer: Self-pay | Admitting: Internal Medicine

## 2021-04-21 ENCOUNTER — Encounter: Admitting: Rehabilitation

## 2021-04-22 ENCOUNTER — Ambulatory Visit

## 2021-04-22 ENCOUNTER — Ambulatory Visit: Admitting: Rehabilitation

## 2021-04-22 ENCOUNTER — Encounter (HOSPITAL_BASED_OUTPATIENT_CLINIC_OR_DEPARTMENT_OTHER): Payer: Self-pay | Admitting: Surgery

## 2021-04-22 ENCOUNTER — Encounter: Payer: Self-pay | Admitting: Rehabilitation

## 2021-04-22 ENCOUNTER — Other Ambulatory Visit: Payer: Self-pay

## 2021-04-22 VITALS — BP 140/98 | HR 84 | Temp 98.1°F | Ht 67.0 in | Wt 191.0 lb

## 2021-04-22 DIAGNOSIS — C50411 Malignant neoplasm of upper-outer quadrant of right female breast: Secondary | ICD-10-CM

## 2021-04-22 DIAGNOSIS — Z171 Estrogen receptor negative status [ER-]: Secondary | ICD-10-CM

## 2021-04-22 DIAGNOSIS — Z483 Aftercare following surgery for neoplasm: Secondary | ICD-10-CM

## 2021-04-22 DIAGNOSIS — I10 Essential (primary) hypertension: Secondary | ICD-10-CM

## 2021-04-22 DIAGNOSIS — Z79899 Other long term (current) drug therapy: Secondary | ICD-10-CM

## 2021-04-22 DIAGNOSIS — L599 Disorder of the skin and subcutaneous tissue related to radiation, unspecified: Secondary | ICD-10-CM

## 2021-04-22 LAB — HM PAP SMEAR

## 2021-04-22 MED ORDER — AMLODIPINE BESYLATE 2.5 MG PO TABS: 2.5000 mg | ORAL_TABLET | Freq: Every day | ORAL | 3 refills | Status: DC

## 2021-04-22 NOTE — Progress Notes (Signed)

## 2021-04-22 NOTE — Progress Notes (Signed)
Pt here today for BPC & to check BMP. She currently takes valsartan 160 at night anywhere from 8-10:30. Being she is taking doxycyline at the moment as well, she tries to make sure she takes all meds correctly. She did bring in her machine from home. 149/101 P: 87. She has not had machine for long.   BP Readings from Last 3 Encounters:  04/22/21 (!) 140/98  04/16/21 130/74  04/15/21 (!) 165/110   Provider added medication amlodipine 2.5 to take in the am. Patient has physical sch for February of next year.

## 2021-04-22 NOTE — Therapy (Signed)
[facility] @ [place], [place], [address] Phone: [phone]   Fax:  [phone]  Physical Therapy Treatment  Patient Details  Name: Shannon Kane MRN: [id number] Date of Birth: October 18, 1970 Referring Provider (PT): Dr. Coralie Keens   Encounter Date: 04/22/2021   PT End of Session - 04/22/21 1343     Visit Number 7    Number of Visits 9    Date for PT Re-Evaluation 05/20/21    PT Start Time 7341    PT Stop Time 1343    PT Time Calculation (min) 38 min    Activity Tolerance Patient tolerated treatment well    Behavior During Therapy [facility] for tasks assessed/performed             Past Medical History:  Diagnosis Date   Abnormal glucose    Breast cancer ([place]) 03/2020   right breast IDC   Family history of breast cancer 04/03/2020   Family history of ovarian cancer 04/03/2020   History of radiation therapy    Right breast- 09/25/20-11/12/20- Dr. [name]   Hypertension    Hyperthyroidism    in the past   Hypothyroidism    Iron deficiency anemia    Palpitation    Personal history of chemotherapy    Spontaneous ecchymoses    Tachycardia    Vitamin D deficiency     Past Surgical History:  Procedure Laterality Date   BREAST BIOPSY     BREAST EXCISIONAL BIOPSY     BREAST LUMPECTOMY Right 08/20/2020   w/ rad w/ chemo   BREAST LUMPECTOMY WITH RADIOACTIVE SEED LOCALIZATION Right 08/20/2020   Procedure: RADIOACTIVE SEED GUIDED RIGHT BREAST LUMPECTOMY;  Surgeon: Coralie Keens, MD;  Location: [place];  Service: General;  Laterality: Right;   CHOLECYSTECTOMY     COLONOSCOPY  05/18/2018   KNEE SURGERY     PORTACATH PLACEMENT Left 04/15/2020   Procedure: INSERTION PORT-A-CATH;  Surgeon: Coralie Keens, MD;  Location: [place];  Service: General;  Laterality: Left;   RADIOACTIVE SEED GUIDED AXILLARY SENTINEL LYMPH NODE Right 08/20/2020   Procedure: RADIOACTIVE SEED TARGETED RIGHT  AXILLARY LYMPH NODE DISSECTION;  Surgeon: Coralie Keens, MD;  Location: [place];  Service: General;  Laterality: Right;   TONSILLECTOMY      There were no vitals filed for this visit.   Subjective Assessment - 04/22/21 1303     Subjective I am having alot of cramps this week.  Port comes out week    Pertinent History Patient was diagnosed on 03/14/2020 with right grade 2 invasive ductal carcinoma breast cancer.  She underwent neoadjuvant chemo followed by a right lumpectomy and targeted node dissection (7 negative lymph nodes) on 08/20/2020. It is ER/PR negative and HER2 positive with a Ki67 of 30%. She has a positive axillary lymph node. Still has port on the left side for infusion for Herception, Projeta.    Currently in Pain? No/denies                               [facility] Adult PT Treatment/Exercise - 04/22/21 0001       Manual Therapy   Myofascial Release to the Rt axilla    Manual Lymphatic Drainage (MLD) PT performed bil axillary nodes and anteiror interaxillary anastamosis avoiding port on left side, Rt inguinal nodes and Rt axillo inguinal pathway then right superior breast to left axilla, right inferior and lateral  breast to latereral chest and back. Pt to sidleying with arm overhead and pillow to back for self instruction in axillary and lateral breast area, then posterior interaxillay anastamosis and trunk                          PT Long Term Goals - 04/03/21 1057       PT LONG TERM GOAL #1   Title Pt will decrease breast heaviness and discomfort by at least 50%    Time 8    Period Weeks    Status On-going      PT LONG TERM GOAL #2   Title Pt will be ind with self MLD to the Rt breast    Time 8    Period Weeks    Status On-going                   Plan - 04/22/21 1343     Clinical Impression Statement Some more cramps this week in the latissimus region.  Still with overall Rt breast edema improving with MT    PT Next  Visit Plan review self MLD, Continue Rt axillary MFR/STM for decongestion and continue MLD to the Rt breast.  Cont every 3 month L-Dex screens for up to 2 years from her SLNB.             Patient will benefit from skilled therapeutic intervention in order to improve the following deficits and impairments:     Visit Diagnosis: Aftercare following surgery for neoplasm  Malignant neoplasm of upper-outer quadrant of right breast in female, estrogen receptor negative ([place])  Disorder of the skin and subcutaneous tissue related to radiation, unspecified     Problem List Patient Active Problem List   Diagnosis Date Noted   Hypokalemia 04/16/2021   Port-A-Cath in place 05/07/2020   Menorrhagia 04/19/2020   Uterine leiomyoma 04/19/2020   Genetic testing 04/19/2020   Family history of ovarian cancer 04/03/2020   Family history of leukemia 04/03/2020   Family history of breast cancer 04/03/2020   Malignant neoplasm of upper-outer quadrant of right breast in female, estrogen receptor negative ([place]) 03/28/2020   Infiltrating ductal carcinoma of breast ([place]) 03/27/2020   Overweight (BMI 25.0-29.9) 12/05/2019   Essential hypertension, benign 05/29/2018   Iritis of left eye 05/29/2018   Iron deficiency anemia due to chronic blood loss 05/29/2018   Primary hypothyroidism 05/29/2018   Stress incontinence in female 02/16/2016    Stark Bray, PT 04/22/2021, 1:45 PM  [facility] @ [place], [place], [address] Phone: [phone]   Fax:  [phone]  Name: Ingrid Shifrin MRN: [id number] Date of Birth: 1971/02/04

## 2021-04-23 ENCOUNTER — Encounter: Payer: Self-pay | Admitting: Hematology and Oncology

## 2021-04-23 LAB — BMP8+EGFR
BUN/Creatinine Ratio: 12 (ref 9–23)
BUN: 10 mg/dL (ref 6–24)
CO2: 26 mmol/L (ref 20–29)
Calcium: 9.4 mg/dL (ref 8.7–10.2)
Chloride: 104 mmol/L (ref 96–106)
Creatinine, Ser: 0.85 mg/dL (ref 0.57–1.00)
Glucose: 85 mg/dL (ref 70–99)
Potassium: 4 mmol/L (ref 3.5–5.2)
Sodium: 142 mmol/L (ref 134–144)
eGFR: 82 mL/min/{1.73_m2} (ref >59)

## 2021-04-25 ENCOUNTER — Encounter: Admitting: Rehabilitation

## 2021-04-29 NOTE — H&P (Signed)
Shannon Kane is an 50 y.o. female.   Chief Complaint: port no longer needed HPI: This is a 50 year old female who has completed her treatment for breast cancer.  Her Port-A-Cath is no longer needed so will be removed.  She is doing well  Past Medical History:  Diagnosis Date   Abnormal glucose    Breast cancer ([place]) 03/2020   right breast IDC   Family history of breast cancer 04/03/2020   Family history of ovarian cancer 04/03/2020   History of radiation therapy    Right breast- 09/25/20-11/12/20- Dr. [name]   Hypertension    Hyperthyroidism    in the past   Hypothyroidism    Iron deficiency anemia    Palpitation    Personal history of chemotherapy    Spontaneous ecchymoses    Tachycardia    Vitamin D deficiency     Past Surgical History:  Procedure Laterality Date   BREAST BIOPSY     BREAST EXCISIONAL BIOPSY     BREAST LUMPECTOMY Right 08/20/2020   w/ rad w/ chemo   BREAST LUMPECTOMY WITH RADIOACTIVE SEED LOCALIZATION Right 08/20/2020   Procedure: RADIOACTIVE SEED GUIDED RIGHT BREAST LUMPECTOMY;  Surgeon: Coralie Keens, MD;  Location: [place];  Service: General;  Laterality: Right;   CHOLECYSTECTOMY     COLONOSCOPY  05/18/2018   KNEE SURGERY     PORTACATH PLACEMENT Left 04/15/2020   Procedure: INSERTION PORT-A-CATH;  Surgeon: Coralie Keens, MD;  Location: [place];  Service: General;  Laterality: Left;   RADIOACTIVE SEED GUIDED AXILLARY SENTINEL LYMPH NODE Right 08/20/2020   Procedure: RADIOACTIVE SEED TARGETED RIGHT AXILLARY LYMPH NODE DISSECTION;  Surgeon: Coralie Keens, MD;  Location: [place];  Service: General;  Laterality: Right;   TONSILLECTOMY      Family History  Problem Relation Age of Onset   Diabetes Mother    Hypertension Mother    Leukemia Father        dx > 65   Ovarian cancer Maternal Grandmother 42   Bone cancer Paternal Grandfather        dx unknown age   Cancer Maternal Uncle        Mouth and throad; dx > 11    Breast cancer Other        MGM's niece, dx unknown age   Cancer Maternal Uncle        unknown type; dx > 50   Social History:  reports that she has never smoked. She has never used smokeless tobacco. She reports current alcohol use. She reports that she does not use drugs.  Allergies:  Allergies  Allergen Reactions   Amoxicillin     Tolerates ANCEF   Ciprofloxacin Itching   Penicillins Hives    Tolerates ANCEF     No medications prior to admission.    No results found for this or any previous visit (from the past 48 hour(s)). No results found.  Review of Systems  All other systems reviewed and are negative.  Height 5\' 8"  (1.727 m), weight 86.2 kg, last menstrual period 12/14/2019. Physical Exam Constitutional:      Appearance: Normal appearance.  Cardiovascular:     Rate and Rhythm: Normal rate and regular rhythm.  Pulmonary:     Effort: Pulmonary effort is normal.     Breath sounds: Normal breath sounds.  Abdominal:     General: Abdomen is flat.     Tenderness: There is no abdominal tenderness.  Neurological:     Mental Status:  She is alert.     Assessment/Plan History of breast cancer with treatment completed and Port-A-Cath no longer needed  We will proceed to the operating room for Port-A-Cath removal.  We discussed the risk which includes but is not limited to bleeding, infection, and injury to surrounding structures.  She understands and wishes to proceed.  Surgery will be scheduled.  Coralie Keens, MD 04/29/2021, 8:23 AM

## 2021-04-30 ENCOUNTER — Encounter (HOSPITAL_BASED_OUTPATIENT_CLINIC_OR_DEPARTMENT_OTHER): Payer: Self-pay | Admitting: Surgery

## 2021-04-30 ENCOUNTER — Other Ambulatory Visit: Payer: Self-pay

## 2021-04-30 ENCOUNTER — Encounter (HOSPITAL_BASED_OUTPATIENT_CLINIC_OR_DEPARTMENT_OTHER)
Admission: RE | Disposition: A | Discharge status: Home / Self Care | Payer: Self-pay | Source: Home / Self Care | Attending: Surgery

## 2021-04-30 ENCOUNTER — Ambulatory Visit (HOSPITAL_BASED_OUTPATIENT_CLINIC_OR_DEPARTMENT_OTHER)
Admission: RE | Admit: 2021-04-30 | Discharge: 2021-04-30 | Disposition: A | Discharge status: Home / Self Care | Attending: Surgery | Admitting: Surgery

## 2021-04-30 ENCOUNTER — Ambulatory Visit (HOSPITAL_BASED_OUTPATIENT_CLINIC_OR_DEPARTMENT_OTHER): Admitting: Certified Registered"

## 2021-04-30 DIAGNOSIS — C50919 Malignant neoplasm of unspecified site of unspecified female breast: Secondary | ICD-10-CM | POA: Diagnosis not present

## 2021-04-30 DIAGNOSIS — E039 Hypothyroidism, unspecified: Secondary | ICD-10-CM | POA: Insufficient documentation

## 2021-04-30 DIAGNOSIS — Z452 Encounter for adjustment and management of vascular access device: Secondary | ICD-10-CM | POA: Insufficient documentation

## 2021-04-30 DIAGNOSIS — D649 Anemia, unspecified: Secondary | ICD-10-CM | POA: Diagnosis not present

## 2021-04-30 DIAGNOSIS — I10 Essential (primary) hypertension: Secondary | ICD-10-CM | POA: Diagnosis not present

## 2021-04-30 HISTORY — PX: PORT-A-CATH REMOVAL: SHX5289

## 2021-04-30 SURGERY — REMOVAL PORT-A-CATH
Anesthesia: Monitor Anesthesia Care | Site: Breast

## 2021-04-30 MED ORDER — MIDAZOLAM HCL 5 MG/5ML IJ SOLN
INTRAMUSCULAR | Status: DC | PRN
  Administered 2021-04-30: 2 mg via INTRAVENOUS

## 2021-04-30 MED ORDER — PROPOFOL 10 MG/ML IV BOLUS
INTRAVENOUS | Status: AC
  Filled 2021-04-30: qty 20

## 2021-04-30 MED ORDER — BUPIVACAINE-EPINEPHRINE (PF) 0.5% -1:200000 IJ SOLN
INTRAMUSCULAR | Status: AC
  Filled 2021-04-30: qty 150

## 2021-04-30 MED ORDER — FENTANYL CITRATE (PF) 100 MCG/2ML IJ SOLN
INTRAMUSCULAR | Status: AC
  Filled 2021-04-30: qty 2

## 2021-04-30 MED ORDER — ACETAMINOPHEN 500 MG PO TABS
ORAL_TABLET | ORAL | Status: AC
  Filled 2021-04-30: qty 2

## 2021-04-30 MED ORDER — LACTATED RINGERS IV SOLN: INTRAVENOUS | Status: DC

## 2021-04-30 MED ORDER — MIDAZOLAM HCL 2 MG/2ML IJ SOLN
INTRAMUSCULAR | Status: AC
  Filled 2021-04-30: qty 2

## 2021-04-30 MED ORDER — HYDROMORPHONE HCL 1 MG/ML IJ SOLN: 0.2500 mg | INTRAMUSCULAR | Status: DC | PRN

## 2021-04-30 MED ORDER — ONDANSETRON HCL 4 MG/2ML IJ SOLN
INTRAMUSCULAR | Status: DC | PRN
  Administered 2021-04-30: 4 mg via INTRAVENOUS

## 2021-04-30 MED ORDER — CHLORHEXIDINE GLUCONATE CLOTH 2 % EX PADS: 6.0000 | MEDICATED_PAD | Freq: Once | CUTANEOUS | Status: DC

## 2021-04-30 MED ORDER — LIDOCAINE HCL (PF) 1 % IJ SOLN
INTRAMUSCULAR | Status: AC
  Filled 2021-04-30: qty 30

## 2021-04-30 MED ORDER — CEFAZOLIN SODIUM-DEXTROSE 2-3 GM-%(50ML) IV SOLR: INTRAVENOUS | Status: DC | PRN

## 2021-04-30 MED ORDER — OXYCODONE HCL 5 MG PO TABS: 5.0000 mg | ORAL_TABLET | Freq: Once | ORAL | Status: DC | PRN

## 2021-04-30 MED ORDER — PROMETHAZINE HCL 25 MG/ML IJ SOLN: 6.2500 mg | INTRAMUSCULAR | Status: DC | PRN

## 2021-04-30 MED ORDER — ACETAMINOPHEN 500 MG PO TABS
1000.0000 mg | ORAL_TABLET | ORAL | Status: AC
  Administered 2021-04-30: 08:00:00 1000 mg via ORAL

## 2021-04-30 MED ORDER — LIDOCAINE 2% (20 MG/ML) 5 ML SYRINGE
INTRAMUSCULAR | Status: AC
  Filled 2021-04-30: qty 5

## 2021-04-30 MED ORDER — LIDOCAINE 2% (20 MG/ML) 5 ML SYRINGE
INTRAMUSCULAR | Status: DC | PRN
  Administered 2021-04-30: 40 mg via INTRAVENOUS

## 2021-04-30 MED ORDER — FENTANYL CITRATE (PF) 100 MCG/2ML IJ SOLN
INTRAMUSCULAR | Status: DC | PRN
  Administered 2021-04-30: 25 ug via INTRAVENOUS
  Administered 2021-04-30: 50 ug via INTRAVENOUS
  Administered 2021-04-30: 25 ug via INTRAVENOUS

## 2021-04-30 MED ORDER — LIDOCAINE HCL (PF) 1 % IJ SOLN
INTRAMUSCULAR | Status: DC | PRN
  Administered 2021-04-30: 6 mL

## 2021-04-30 MED ORDER — OXYCODONE HCL 5 MG/5ML PO SOLN: 5.0000 mg | Freq: Once | ORAL | Status: DC | PRN

## 2021-04-30 MED ORDER — PROPOFOL 500 MG/50ML IV EMUL
INTRAVENOUS | Status: AC
  Filled 2021-04-30: qty 50

## 2021-04-30 MED ORDER — PROPOFOL 500 MG/50ML IV EMUL
INTRAVENOUS | Status: DC | PRN
  Administered 2021-04-30: 200 ug/kg/min via INTRAVENOUS

## 2021-04-30 MED ORDER — CEFAZOLIN SODIUM-DEXTROSE 2-4 GM/100ML-% IV SOLN
INTRAVENOUS | Status: AC
  Filled 2021-04-30: qty 100

## 2021-04-30 SURGICAL SUPPLY — 29 items
ADH SKN CLS APL DERMABOND .7 (GAUZE/BANDAGES/DRESSINGS) ×1
APL PRP STRL LF DISP 70% ISPRP (MISCELLANEOUS) ×1
BLADE SURG 15 STRL LF DISP TIS (BLADE) ×2 IMPLANT
BLADE SURG 15 STRL SS (BLADE) ×2
CHLORAPREP W/TINT 26 (MISCELLANEOUS) ×3 IMPLANT
COVER BACK TABLE 60X90IN (DRAPES) ×3 IMPLANT
COVER MAYO STAND STRL (DRAPES) ×3 IMPLANT
DERMABOND ADVANCED (GAUZE/BANDAGES/DRESSINGS) ×1
DERMABOND ADVANCED .7 DNX12 (GAUZE/BANDAGES/DRESSINGS) ×2 IMPLANT
DRAPE LAPAROTOMY 100X72 PEDS (DRAPES) ×3 IMPLANT
DRAPE UTILITY XL STRL (DRAPES) ×3 IMPLANT
ELECT REM PT RETURN 9FT ADLT (ELECTROSURGICAL) ×2
ELECTRODE REM PT RTRN 9FT ADLT (ELECTROSURGICAL) ×2 IMPLANT
GLOVE SURG SIGNA 7.5 PF LTX (GLOVE) ×3 IMPLANT
GLOVE SURG UNDER POLY LF SZ7 (GLOVE) ×1 IMPLANT
GOWN STRL REUS W/ TWL LRG LVL3 (GOWN DISPOSABLE) ×2 IMPLANT
GOWN STRL REUS W/ TWL XL LVL3 (GOWN DISPOSABLE) ×2 IMPLANT
GOWN STRL REUS W/TWL LRG LVL3 (GOWN DISPOSABLE) ×2
GOWN STRL REUS W/TWL XL LVL3 (GOWN DISPOSABLE) ×2
NDL HYPO 25X1 1.5 SAFETY (NEEDLE) ×2 IMPLANT
NEEDLE HYPO 25X1 1.5 SAFETY (NEEDLE) ×2 IMPLANT
NS IRRIG 1000ML POUR BTL (IV SOLUTION) IMPLANT
PACK BASIN DAY SURGERY FS (CUSTOM PROCEDURE TRAY) ×3 IMPLANT
PENCIL SMOKE EVACUATOR (MISCELLANEOUS) ×3 IMPLANT
SUT MNCRL AB 4-0 PS2 18 (SUTURE) ×3 IMPLANT
SUT VIC AB 3-0 SH 27 (SUTURE) ×2
SUT VIC AB 3-0 SH 27X BRD (SUTURE) ×2 IMPLANT
SYR CONTROL 10ML LL (SYRINGE) ×3 IMPLANT
TOWEL GREEN STERILE FF (TOWEL DISPOSABLE) ×3 IMPLANT

## 2021-04-30 NOTE — Transfer of Care (Signed)
Immediate Anesthesia Transfer of Care Note  Patient: Donell Sievert  Procedure(s) Performed: REMOVAL PORT-A-CATH (Breast)  Patient Location: PACU  Anesthesia Type:MAC  Level of Consciousness: drowsy  Airway & Oxygen Therapy: Patient Spontanous Breathing and Patient connected to face mask oxygen  Post-op Assessment: Report given to RN and Post -op Vital signs reviewed and stable  Post vital signs: Reviewed and stable  Last Vitals:  Vitals Value Taken Time  BP    Temp 36.5 C 04/30/21 0847  Pulse 80 04/30/21 0847  Resp 12 04/30/21 0847  SpO2 98 % 04/30/21 0847    Last Pain:  Vitals:   04/30/21 0749  TempSrc: Oral  PainSc: 0-No pain         Complications: No notable events documented.

## 2021-04-30 NOTE — Anesthesia Postprocedure Evaluation (Signed)
Anesthesia Post Note  Patient: Shannon Kane  Procedure(s) Performed: REMOVAL PORT-A-CATH (Breast)     Patient location during evaluation: PACU Anesthesia Type: MAC Level of consciousness: awake and alert Pain management: pain level controlled Vital Signs Assessment: post-procedure vital signs reviewed and stable Respiratory status: spontaneous breathing, nonlabored ventilation and respiratory function stable Cardiovascular status: blood pressure returned to baseline and stable Postop Assessment: no apparent nausea or vomiting Anesthetic complications: no   No notable events documented.  Last Vitals:  Vitals:   04/30/21 0915 04/30/21 0925  BP: 124/81 (!) 141/86  Pulse: 73 74  Resp: 13 16  Temp:  36.4 C  SpO2: 95% 99%    Last Pain:  Vitals:   04/30/21 0925  TempSrc:   PainSc: 0-No pain                 Lynda Rainwater

## 2021-04-30 NOTE — Interval H&P Note (Signed)
History and Physical Interval Note:no change in H and P  04/30/2021 8:04 AM  Shannon Kane  has presented today for surgery, with the diagnosis of PORT NO LONGER NEEDED, BREAST CANCER THERAPY COMPLETED.  The various methods of treatment have been discussed with the patient and family. After consideration of risks, benefits and other options for treatment, the patient has consented to  Procedure(s): REMOVAL PORT-A-CATH (N/A) as a surgical intervention.  The patient's history has been reviewed, patient examined, no change in status, stable for surgery.  I have reviewed the patient's chart and labs.  Questions were answered to the patient's satisfaction.     Coralie Keens

## 2021-04-30 NOTE — Anesthesia Preprocedure Evaluation (Signed)
Anesthesia Evaluation  Patient identified by MRN, date of birth, ID band Patient awake    Reviewed: Allergy & Precautions, H&P , NPO status , Patient's Chart, lab work & pertinent test results, reviewed documented beta blocker date and time   History of Anesthesia Complications (+) PONV  Airway Mallampati: II  TM Distance: >3 FB Neck ROM: full    Dental no notable dental hx. (+) Teeth Intact, Dental Advisory Given   Pulmonary    Pulmonary exam normal breath sounds clear to auscultation       Cardiovascular Exercise Tolerance: Good hypertension, Pt. on medications  Rhythm:regular Rate:Normal  EKG: 07/10/20: NSR at 78 bpm   CV: Echo 04/09/20: IMPRESSIONS  1. Left ventricular ejection fraction, by estimation, is 60 to 65%. The  left ventricle has normal function. The left ventricle has no regional  wall motion abnormalities. Left ventricular diastolic parameters were  normal. The average left ventricular  global longitudinal strain is -18.3 %. The global longitudinal strain is  normal.  2. Right ventricular systolic function is normal. The right ventricular  size is normal.  3. Left atrial size was mildly dilated.  4. The mitral valve is normal in structure. No evidence of mitral valve  regurgitation. No evidence of mitral stenosis.  5. The aortic valve is normal in structure. Aortic valve regurgitation is  not visualized. No aortic stenosis is present.  6. The inferior vena cava is normal in size with greater than 50%  respiratory variability, suggesting right atrial pressure of 3 mmHg.     Neuro/Psych    GI/Hepatic   Endo/Other  Hypothyroidism   Renal/GU      Musculoskeletal   Abdominal   Peds  Hematology  (+) Blood dyscrasia, anemia ,   Anesthesia Other Findings   Reproductive/Obstetrics                             Anesthesia Physical  Anesthesia Plan  ASA:  2  Anesthesia Plan: MAC   Post-op Pain Management: GA combined w/ Regional for post-op pain   Induction: Intravenous  PONV Risk Score and Plan: 3 and Ondansetron, Dexamethasone, Treatment may vary due to age or medical condition and Midazolam  Airway Management Planned: Simple Face Mask  Additional Equipment:   Intra-op Plan:   Post-operative Plan:   Informed Consent: I have reviewed the patients History and Physical, chart, labs and discussed the procedure including the risks, benefits and alternatives for the proposed anesthesia with the patient or authorized representative who has indicated his/her understanding and acceptance.     Dental Advisory Given  Plan Discussed with: CRNA and Anesthesiologist  Anesthesia Plan Comments: (PAT note written by Myra Gianotti, PA-C. )        Anesthesia Quick Evaluation

## 2021-04-30 NOTE — Discharge Instructions (Addendum)
You may shower starting tomorrow  Ice pack, Tylenol, and ibuprofen for pain  No vigorous activity for around 4 days    Post Anesthesia Home Care Instructions  Activity: Get plenty of rest for the remainder of the day. A responsible individual must stay with you for 24 hours following the procedure.  For the next 24 hours, DO NOT: -Drive a car -Paediatric nurse -Drink alcoholic beverages -Take any medication unless instructed by your physician -Make any legal decisions or sign important papers.  Meals: Start with liquid foods such as gelatin or soup. Progress to regular foods as tolerated. Avoid greasy, spicy, heavy foods. If nausea and/or vomiting occur, drink only clear liquids until the nausea and/or vomiting subsides. Call your physician if vomiting continues.  Special Instructions/Symptoms: Your throat may feel dry or sore from the anesthesia or the breathing tube placed in your throat during surgery. If this causes discomfort, gargle with warm salt water. The discomfort should disappear within 24 hours.  If you had a scopolamine patch placed behind your ear for the management of post- operative nausea and/or vomiting:  1. The medication in the patch is effective for 72 hours, after which it should be removed.  Wrap patch in a tissue and discard in the trash. Wash hands thoroughly with soap and water. 2. You may remove the patch earlier than 72 hours if you experience unpleasant side effects which may include dry mouth, dizziness or visual disturbances. 3. Avoid touching the patch. Wash your hands with soap and water after contact with the patch.      Next dose of Tylenol can be given at 2:00pm if needed.

## 2021-04-30 NOTE — Op Note (Signed)
REMOVAL PORT-A-CATH  Procedure Note  Shannon Kane 04/30/2021   Pre-op Diagnosis: PORT NO LONGER NEEDED, BREAST CANCER THERAPY COMPLETED     Post-op Diagnosis: same   Procedure(s): REMOVAL PORT-A-CATH  Surgeon(s): Coralie Keens, MD  Anesthesia: Monitor Anesthesia Care  Staff:  Circulator: Izora Ribas, RN Scrub Person: Lorenza Burton, CST  Estimated Blood Loss: Minimal               Procedure: The patient was brought to operating identifies correct patient.  She is placed upon the operating table and anesthesia was induced.  Her left chest at the Port-A-Cath site was then prepped and draped in usual sterile fashion.  I anesthetized the skin of the old scar with lidocaine with epinephrine.  I then made an elliptical incision with a scalpel to excise the old scar.  I then excised the scar with electrocautery.  Using the cautery I then dissected down to the port which was easily identified.  I excised the previous sutures and then was able to easily remove the port and catheter in its entirety.  I then closed the catheter tract with a figure-of-eight 3-0 Vicryl suture.  I then closed the subcutaneous tissue with interrupted 3-0 Vicryl sutures and closed the skin with a running 4-0 Monocryl.  Dermabond was then applied.  The patient tolerated the procedure well.  All the counts were correct at the end of the procedure.  The patient was then taken in stable condition from the operating room to the recovery room.          Coralie Keens   Date: 04/30/2021  Time: 8:42 AM

## 2021-05-01 ENCOUNTER — Encounter (HOSPITAL_BASED_OUTPATIENT_CLINIC_OR_DEPARTMENT_OTHER): Payer: Self-pay | Admitting: Surgery

## 2021-05-02 ENCOUNTER — Ambulatory Visit: Admitting: Physical Therapy

## 2021-05-02 ENCOUNTER — Other Ambulatory Visit: Payer: Self-pay

## 2021-05-02 ENCOUNTER — Encounter: Payer: Self-pay | Admitting: Physical Therapy

## 2021-05-02 DIAGNOSIS — L599 Disorder of the skin and subcutaneous tissue related to radiation, unspecified: Secondary | ICD-10-CM

## 2021-05-02 DIAGNOSIS — Z483 Aftercare following surgery for neoplasm: Secondary | ICD-10-CM | POA: Diagnosis not present

## 2021-05-02 DIAGNOSIS — C50411 Malignant neoplasm of upper-outer quadrant of right female breast: Secondary | ICD-10-CM

## 2021-05-02 DIAGNOSIS — Z171 Estrogen receptor negative status [ER-]: Secondary | ICD-10-CM

## 2021-05-02 NOTE — Therapy (Signed)
[facility] @ [place], [place], [address] Phone: [phone]   Fax:  [phone]  Physical Therapy Treatment  Patient Details  Name: Shannon Kane MRN: [id number] Date of Birth: 1970/07/12 Referring Provider (PT): Dr. Coralie Keens   Encounter Date: 05/02/2021   PT End of Session - 05/02/21 1203     Visit Number 8    Number of Visits 9    Date for PT Re-Evaluation 05/20/21    PT Start Time 1103    PT Stop Time 1747    PT Time Calculation (min) 42 min    Activity Tolerance Patient tolerated treatment well    Behavior During Therapy [facility] for tasks assessed/performed             Past Medical History:  Diagnosis Date   Abnormal glucose    Breast cancer ([place]) 03/2020   right breast IDC   Family history of breast cancer 04/03/2020   Family history of ovarian cancer 04/03/2020   History of radiation therapy    Right breast- 09/25/20-11/12/20- Dr. [name]   Hypertension    Hyperthyroidism    in the past   Hypothyroidism    Iron deficiency anemia    Palpitation    Personal history of chemotherapy    Spontaneous ecchymoses    Tachycardia    Vitamin D deficiency     Past Surgical History:  Procedure Laterality Date   BREAST BIOPSY     BREAST EXCISIONAL BIOPSY     BREAST LUMPECTOMY Right 08/20/2020   w/ rad w/ chemo   BREAST LUMPECTOMY WITH RADIOACTIVE SEED LOCALIZATION Right 08/20/2020   Procedure: RADIOACTIVE SEED GUIDED RIGHT BREAST LUMPECTOMY;  Surgeon: Coralie Keens, MD;  Location: [place];  Service: General;  Laterality: Right;   CHOLECYSTECTOMY     COLONOSCOPY  05/18/2018   KNEE SURGERY     PORT-A-CATH REMOVAL N/A 04/30/2021   Procedure: REMOVAL PORT-A-CATH;  Surgeon: Coralie Keens, MD;  Location: [place];  Service: General;  Laterality: N/A;   PORTACATH PLACEMENT Left 04/15/2020   Procedure: INSERTION PORT-A-CATH;  Surgeon: Coralie Keens, MD;  Location:  [place];  Service: General;  Laterality: Left;   RADIOACTIVE SEED GUIDED AXILLARY SENTINEL LYMPH NODE Right 08/20/2020   Procedure: RADIOACTIVE SEED TARGETED RIGHT AXILLARY LYMPH NODE DISSECTION;  Surgeon: Coralie Keens, MD;  Location: [place];  Service: General;  Laterality: Right;   TONSILLECTOMY      There were no vitals filed for this visit.   Subjective Assessment - 05/02/21 1105     Subjective Pt had her port removed on Wednesday and it is still a [place] sore. [place]. 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  She underwent neoadjuvant chemo followed by a right lumpectomy and targeted node dissection (7 negative lymph nodes) on 08/20/2020. It is ER/PR negative and HER2 positive with a Ki67 of 30%. She has a positive axillary lymph node. Still has port on the left side for infusion for Herception, Projeta. Port removed on 04/30/2021    Currently in Pain? No/denies                               [facility] Adult PT Treatment/Exercise -  05/02/21 0001       Manual Therapy   Soft tissue mobilization with cocoa butter to tight upper trap and lats mucslce on the right    Myofascial Release to the Rt axilla    Manual Lymphatic Drainage (MLD) PT performed bil axillary nodes and anteiror interaxillary anastamosis avoiding port on left side, Rt inguinal nodes and Rt axillo inguinal pathway then right superior breast to left axilla, right inferior and lateral breast to latereral chest and back. Pt to sidleying with arm overhead and pillow to back for self instruction in axillary and lateral breast area, then posterior interaxillay anastamosis and trunk                          PT Long Term Goals - 04/03/21 1057       PT LONG TERM GOAL #1    Title Pt will decrease breast heaviness and discomfort by at least 50%    Time 8    Period Weeks    Status On-going      PT LONG TERM GOAL #2   Title Pt will be ind with self MLD to the Rt breast    Time 8    Period Weeks    Status On-going                   Plan - 05/02/21 1203     Clinical Impression Statement Pt is improving overall,  She will be ready to discharge from PT next session    Stability/Clinical Decision Making Stable/Uncomplicated    Rehab Potential Excellent    PT Frequency 1x / week    PT Duration 8 weeks    PT Treatment/Interventions ADLs/Self Care Home Management;Therapeutic exercise;Patient/family education;Manual techniques;Taping    PT Next Visit Plan Goal check and review self MLD, Continue Rt axillary MFR/STM for decongestion and continue MLD to the Rt breast. Discharge from PT but  Cont every 3 month L-Dex screens for up to 2 years from her SLNB.    Consulted and Agree with Plan of Care Patient             Patient will benefit from skilled therapeutic intervention in order to improve the following deficits and impairments:  Postural dysfunction, Impaired UE functional use, Pain, Decreased knowledge of precautions, Increased edema  Visit Diagnosis: Aftercare following surgery for neoplasm  Malignant neoplasm of upper-outer quadrant of right breast in female, estrogen receptor negative ([place])  Disorder of the skin and subcutaneous tissue related to radiation, unspecified     Problem List Patient Active Problem List   Diagnosis Date Noted   Hypokalemia 04/16/2021   Port-A-Cath in place 05/07/2020   Menorrhagia 04/19/2020   Uterine leiomyoma 04/19/2020   Genetic testing 04/19/2020   Family history of ovarian cancer 04/03/2020   Family history of leukemia 04/03/2020   Family history of breast cancer 04/03/2020   Malignant neoplasm of upper-outer quadrant of right breast in female, estrogen receptor negative ([place]) 03/28/2020    Infiltrating ductal carcinoma of breast ([place]) 03/27/2020   Overweight (BMI 25.0-29.9) 12/05/2019   Essential hypertension, benign 05/29/2018   Iritis of left eye 05/29/2018   Iron deficiency anemia due to chronic blood loss 05/29/2018   Primary hypothyroidism 05/29/2018   Stress incontinence in female 02/16/2016    Norwood Levo, PT 05/02/2021, 12:05 PM  [facility] @ [address], [place], [address] Phone: [phone]   Fax:  [phone]  Name: Shannon Kane MRN: [id number] Date of Birth: [birth year]

## 2021-05-08 ENCOUNTER — Ambulatory Visit: Admitting: Rehabilitation

## 2021-05-08 ENCOUNTER — Encounter: Payer: Self-pay | Admitting: Rehabilitation

## 2021-05-08 ENCOUNTER — Other Ambulatory Visit: Payer: Self-pay

## 2021-05-08 DIAGNOSIS — Z483 Aftercare following surgery for neoplasm: Secondary | ICD-10-CM | POA: Diagnosis not present

## 2021-05-08 DIAGNOSIS — L599 Disorder of the skin and subcutaneous tissue related to radiation, unspecified: Secondary | ICD-10-CM

## 2021-05-08 DIAGNOSIS — Z171 Estrogen receptor negative status [ER-]: Secondary | ICD-10-CM

## 2021-05-08 DIAGNOSIS — C50411 Malignant neoplasm of upper-outer quadrant of right female breast: Secondary | ICD-10-CM

## 2021-05-08 NOTE — Therapy (Signed)
[facility] @ [address] [place], [place], [address] Phone: [phone]   Fax:  [phone]  Physical Therapy Treatment  Patient Details  Name: Shannon Kane MRN: [id number] Date of Birth: 26-Jun-1970 Referring Provider (PT): Dr. Coralie Keens   Encounter Date: 05/08/2021   PT End of Session - 05/08/21 1242     Visit Number 9    Number of Visits 9    Date for PT Re-Evaluation 05/20/21    PT Start Time 1200    PT Stop Time 6384    PT Time Calculation (min) 41 min    Activity Tolerance Patient tolerated treatment well             Past Medical History:  Diagnosis Date   Abnormal glucose    Breast cancer ([place]) 03/2020   right breast IDC   Family history of breast cancer 04/03/2020   Family history of ovarian cancer 04/03/2020   History of radiation therapy    Right breast- 09/25/20-11/12/20- Dr. [name]   Hypertension    Hyperthyroidism    in the past   Hypothyroidism    Iron deficiency anemia    Palpitation    Personal history of chemotherapy    Spontaneous ecchymoses    Tachycardia    Vitamin D deficiency     Past Surgical History:  Procedure Laterality Date   BREAST BIOPSY     BREAST EXCISIONAL BIOPSY     BREAST LUMPECTOMY Right 08/20/2020   w/ rad w/ chemo   BREAST LUMPECTOMY WITH RADIOACTIVE SEED LOCALIZATION Right 08/20/2020   Procedure: RADIOACTIVE SEED GUIDED RIGHT BREAST LUMPECTOMY;  Surgeon: Coralie Keens, MD;  Location: [facility];  Service: General;  Laterality: Right;   CHOLECYSTECTOMY     COLONOSCOPY  05/18/2018   KNEE SURGERY     PORT-A-CATH REMOVAL N/A 04/30/2021   Procedure: REMOVAL PORT-A-CATH;  Surgeon: Coralie Keens, MD;  Location: [facility];  Service: General;  Laterality: N/A;   PORTACATH PLACEMENT Left 04/15/2020   Procedure: INSERTION PORT-A-CATH;  Surgeon: Coralie Keens, MD;  Location: [facility];  Service: General;  Laterality:  Left;   RADIOACTIVE SEED GUIDED AXILLARY SENTINEL LYMPH NODE Right 08/20/2020   Procedure: RADIOACTIVE SEED TARGETED RIGHT AXILLARY LYMPH NODE DISSECTION;  Surgeon: Coralie Keens, MD;  Location: [facility];  Service: General;  Laterality: Right;   TONSILLECTOMY      There were no vitals filed for this visit.   Subjective Assessment - 05/08/21 1202     Subjective I feel ready for the last day    Pertinent History Patient was diagnosed on 03/14/2020 with right grade 2 invasive ductal carcinoma breast cancer.  She underwent neoadjuvant chemo followed by a right lumpectomy and targeted node dissection (7 negative lymph nodes) on 08/20/2020. It is ER/PR negative and HER2 positive with a Ki67 of 30%. She has a positive axillary lymph node. Still has port on the left side for infusion for Herception, Projeta. Port removed on 04/30/2021    Currently in Pain? No/denies                               [facility] Adult PT Treatment/Exercise - 05/08/21 0001       Manual Therapy   Soft tissue mobilization with cocoa butter to tight upper trap and lats muscle on the right in supine and sidelying with instruction to husband on doing this at home  Myofascial Release to the Rt axilla    Manual Lymphatic Drainage (MLD) PT performed Rt axillary nodes , lateral breast to latereral chest and back. Pt to sidleying with arm overhead and pillow to back for self instruction in axillary and lateral breast area, then posterior interaxillay anastamosis and trunk with poterior route instruction to husband                          PT Long Term Goals - 05/08/21 1203       PT LONG TERM GOAL #1   Title Pt will decrease breast heaviness and discomfort by at least 50%    Status Achieved      PT LONG TERM GOAL #2   Title Pt will be ind with self MLD to the Rt breast    Status Achieved                   Plan - 05/08/21 1242     Clinical Impression Statement pt is ready for DC  with all goals met    PT Treatment/Interventions ADLs/Self Care Home Management;Therapeutic exercise;Patient/family education;Manual techniques;Taping    PT Next Visit Plan cont SOZO    Consulted and Agree with Plan of Care Patient;Family member/caregiver             Patient will benefit from skilled therapeutic intervention in order to improve the following deficits and impairments:  Postural dysfunction, Impaired UE functional use, Pain, Decreased knowledge of precautions, Increased edema  Visit Diagnosis: Aftercare following surgery for neoplasm  Malignant neoplasm of upper-outer quadrant of right breast in female, estrogen receptor negative ([facility])  Disorder of the skin and subcutaneous tissue related to radiation, unspecified     Problem List Patient Active Problem List   Diagnosis Date Noted   Hypokalemia 04/16/2021   Port-A-Cath in place 05/07/2020   Menorrhagia 04/19/2020   Uterine leiomyoma 04/19/2020   Genetic testing 04/19/2020   Family history of ovarian cancer 04/03/2020   Family history of leukemia 04/03/2020   Family history of breast cancer 04/03/2020   Malignant neoplasm of upper-outer quadrant of right breast in female, estrogen receptor negative ([facility]) 03/28/2020   Infiltrating ductal carcinoma of breast ([facility]) 03/27/2020   Overweight (BMI 25.0-29.9) 12/05/2019   Essential hypertension, benign 05/29/2018   Iritis of left eye 05/29/2018   Iron deficiency anemia due to chronic blood loss 05/29/2018   Primary hypothyroidism 05/29/2018   Stress incontinence in female 02/16/2016    Stark Bray, PT 05/08/2021, 12:43 PM  [facility] @ [address] [place], [place], [address] Phone: [phone]   Fax:  [phone]  Name: Shannon Kane MRN: [id number] Date of Birth: 07-30-1970

## 2021-05-16 ENCOUNTER — Encounter: Admitting: Rehabilitation

## 2021-06-09 ENCOUNTER — Other Ambulatory Visit: Payer: Self-pay

## 2021-06-09 ENCOUNTER — Ambulatory Visit: Attending: Hematology and Oncology

## 2021-06-09 VITALS — Wt 192.4 lb

## 2021-06-09 DIAGNOSIS — Z483 Aftercare following surgery for neoplasm: Secondary | ICD-10-CM | POA: Insufficient documentation

## 2021-06-09 NOTE — Therapy (Signed)
[facility] @ [address] [place], [place], [address] Phone: [phone]   Fax:  [phone]  Physical Therapy Treatment  Patient Details  Name: Shannon Kane MRN: [id number] Date of Birth: [birth year] Referring Provider (PT): Dr. Coralie Keens   Encounter Date: 06/09/2021   PT End of Session - 06/09/21 1014     Visit Number 9   # unchanged due to screen only   PT Start Time 1011    PT Stop Time 1018    PT Time Calculation (min) 7 min    Activity Tolerance Patient tolerated treatment well    Behavior During Therapy [facility] for tasks assessed/performed             Past Medical History:  Diagnosis Date   Abnormal glucose    Breast cancer ([facility]) 03/2020   right breast IDC   Family history of breast cancer 04/03/2020   Family history of ovarian cancer 04/03/2020   History of radiation therapy    Right breast- 09/25/20-11/12/20- Dr. [name]   Hypertension    Hyperthyroidism    in the past   Hypothyroidism    Iron deficiency anemia    Palpitation    Personal history of chemotherapy    Spontaneous ecchymoses    Tachycardia    Vitamin D deficiency     Past Surgical History:  Procedure Laterality Date   BREAST BIOPSY     BREAST EXCISIONAL BIOPSY     BREAST LUMPECTOMY Right 08/20/2020   w/ rad w/ chemo   BREAST LUMPECTOMY WITH RADIOACTIVE SEED LOCALIZATION Right 08/20/2020   Procedure: RADIOACTIVE SEED GUIDED RIGHT BREAST LUMPECTOMY;  Surgeon: Coralie Keens, MD;  Location: [facility];  Service: General;  Laterality: Right;   CHOLECYSTECTOMY     COLONOSCOPY  05/18/2018   KNEE SURGERY     PORT-A-CATH REMOVAL N/A 04/30/2021   Procedure: REMOVAL PORT-A-CATH;  Surgeon: Coralie Keens, MD;  Location: [facility];  Service: General;  Laterality: N/A;   PORTACATH PLACEMENT Left 04/15/2020   Procedure: INSERTION PORT-A-CATH;  Surgeon: Coralie Keens, MD;  Location: [facility];   Service: General;  Laterality: Left;   RADIOACTIVE SEED GUIDED AXILLARY SENTINEL LYMPH NODE Right 08/20/2020   Procedure: RADIOACTIVE SEED TARGETED RIGHT AXILLARY LYMPH NODE DISSECTION;  Surgeon: Coralie Keens, MD;  Location: [facility];  Service: General;  Laterality: Right;   TONSILLECTOMY      Vitals:   06/09/21 1013  Weight: 192 lb 6 oz (87.3 kg)     Subjective Assessment - 06/09/21 1013     Subjective Pt returns for her 3 month L-Dex screen.    Pertinent History Patient was diagnosed on 03/14/2020 with right grade 2 invasive ductal carcinoma breast cancer.  She underwent neoadjuvant chemo followed by a right lumpectomy and targeted node dissection (7 negative lymph nodes) on 08/20/2020. It is ER/PR negative and HER2 positive with a Ki67 of 30%. She has a positive axillary lymph node. Still has port on the left side for infusion for Herception, Projeta. Port removed on 04/30/2021                    L-DEX FLOWSHEETS - 06/09/21 1000       L-DEX LYMPHEDEMA SCREENING   Measurement Type Unilateral    L-DEX MEASUREMENT EXTREMITY Upper Extremity    POSITION  Standing    DOMINANT SIDE Left    At Risk Side Right    BASELINE SCORE (UNILATERAL) 4.2  L-DEX SCORE (UNILATERAL) 2.4    VALUE CHANGE (UNILAT) -1.8                                     PT Long Term Goals - 05/08/21 1203       PT LONG TERM GOAL #1   Title Pt will decrease breast heaviness and discomfort by at least 50%    Status Achieved      PT LONG TERM GOAL #2   Title Pt will be ind with self MLD to the Rt breast    Status Achieved                   Plan - 06/09/21 1015     Clinical Impression Statement Pt returns for her 3 month L-Dex screen. Her change from baseline of -1.8 is WNLs so no further is required at this time except to cont every 3 month L-Dex screens which pt is agreeable to.    PT Next Visit Plan Cont every 3 month L-Dex screens for up to 2 years from her  targeted node dissection (~08/21/2022)    Consulted and Agree with Plan of Care Patient             Patient will benefit from skilled therapeutic intervention in order to improve the following deficits and impairments:     Visit Diagnosis: Aftercare following surgery for neoplasm     Problem List Patient Active Problem List   Diagnosis Date Noted   Hypokalemia 04/16/2021   Port-A-Cath in place 05/07/2020   Menorrhagia 04/19/2020   Uterine leiomyoma 04/19/2020   Genetic testing 04/19/2020   Family history of ovarian cancer 04/03/2020   Family history of leukemia 04/03/2020   Family history of breast cancer 04/03/2020   Malignant neoplasm of upper-outer quadrant of right breast in female, estrogen receptor negative ([facility]) 03/28/2020   Infiltrating ductal carcinoma of breast ([place]) 03/27/2020   Overweight (BMI 25.0-29.9) 12/05/2019   Essential hypertension, benign 05/29/2018   Iritis of left eye 05/29/2018   Iron deficiency anemia due to chronic blood loss 05/29/2018   Primary hypothyroidism 05/29/2018   Stress incontinence in female 02/16/2016    Otelia Limes, PTA 06/09/2021, 10:20 AM  [facility] @ [address] [place], [place], [address] Phone: [phone]   Fax:  [phone]  Name: Meigan Pates MRN: [id number] Date of Birth: 1970-06-19

## 2021-07-14 ENCOUNTER — Telehealth: Payer: Self-pay | Admitting: *Deleted

## 2021-07-15 ENCOUNTER — Inpatient Hospital Stay: Attending: Adult Health | Admitting: Adult Health

## 2021-07-15 ENCOUNTER — Ambulatory Visit (INDEPENDENT_AMBULATORY_CARE_PROVIDER_SITE_OTHER): Admitting: Internal Medicine

## 2021-07-15 ENCOUNTER — Encounter: Payer: Self-pay | Admitting: Adult Health

## 2021-07-15 ENCOUNTER — Other Ambulatory Visit: Payer: Self-pay

## 2021-07-15 ENCOUNTER — Ambulatory Visit (HOSPITAL_BASED_OUTPATIENT_CLINIC_OR_DEPARTMENT_OTHER)
Admission: RE | Admit: 2021-07-15 | Discharge: 2021-07-15 | Disposition: A | Discharge status: Home / Self Care | Source: Ambulatory Visit | Attending: Adult Health | Admitting: Adult Health

## 2021-07-15 ENCOUNTER — Encounter: Payer: Self-pay | Admitting: Internal Medicine

## 2021-07-15 VITALS — BP 126/94 | HR 113 | Temp 98.1°F | Resp 18 | Ht 68.2 in | Wt 195.4 lb

## 2021-07-15 VITALS — BP 130/80 | HR 97 | Temp 98.1°F | Ht 68.2 in | Wt 192.6 lb

## 2021-07-15 DIAGNOSIS — K5904 Chronic idiopathic constipation: Secondary | ICD-10-CM | POA: Insufficient documentation

## 2021-07-15 DIAGNOSIS — M8580 Other specified disorders of bone density and structure, unspecified site: Secondary | ICD-10-CM | POA: Diagnosis not present

## 2021-07-15 DIAGNOSIS — E039 Hypothyroidism, unspecified: Secondary | ICD-10-CM | POA: Diagnosis not present

## 2021-07-15 DIAGNOSIS — I10 Essential (primary) hypertension: Secondary | ICD-10-CM | POA: Diagnosis not present

## 2021-07-15 DIAGNOSIS — R238 Other skin changes: Secondary | ICD-10-CM

## 2021-07-15 DIAGNOSIS — K5901 Slow transit constipation: Secondary | ICD-10-CM

## 2021-07-15 DIAGNOSIS — Z803 Family history of malignant neoplasm of breast: Secondary | ICD-10-CM | POA: Insufficient documentation

## 2021-07-15 DIAGNOSIS — C50211 Malignant neoplasm of upper-inner quadrant of right female breast: Secondary | ICD-10-CM | POA: Diagnosis present

## 2021-07-15 DIAGNOSIS — R1310 Dysphagia, unspecified: Secondary | ICD-10-CM | POA: Insufficient documentation

## 2021-07-15 DIAGNOSIS — Z171 Estrogen receptor negative status [ER-]: Secondary | ICD-10-CM | POA: Insufficient documentation

## 2021-07-15 DIAGNOSIS — Z808 Family history of malignant neoplasm of other organs or systems: Secondary | ICD-10-CM | POA: Diagnosis not present

## 2021-07-15 DIAGNOSIS — Z0000 Encounter for general adult medical examination without abnormal findings: Secondary | ICD-10-CM | POA: Diagnosis not present

## 2021-07-15 DIAGNOSIS — K5900 Constipation, unspecified: Secondary | ICD-10-CM | POA: Insufficient documentation

## 2021-07-15 DIAGNOSIS — K219 Gastro-esophageal reflux disease without esophagitis: Secondary | ICD-10-CM | POA: Insufficient documentation

## 2021-07-15 DIAGNOSIS — K921 Melena: Secondary | ICD-10-CM

## 2021-07-15 DIAGNOSIS — C50411 Malignant neoplasm of upper-outer quadrant of right female breast: Secondary | ICD-10-CM | POA: Insufficient documentation

## 2021-07-15 DIAGNOSIS — Z23 Encounter for immunization: Secondary | ICD-10-CM

## 2021-07-15 DIAGNOSIS — Z6829 Body mass index (BMI) 29.0-29.9, adult: Secondary | ICD-10-CM

## 2021-07-15 DIAGNOSIS — R7309 Other abnormal glucose: Secondary | ICD-10-CM

## 2021-07-15 DIAGNOSIS — Z806 Family history of leukemia: Secondary | ICD-10-CM | POA: Insufficient documentation

## 2021-07-15 DIAGNOSIS — Z8041 Family history of malignant neoplasm of ovary: Secondary | ICD-10-CM | POA: Diagnosis not present

## 2021-07-15 DIAGNOSIS — R0602 Shortness of breath: Secondary | ICD-10-CM

## 2021-07-15 LAB — POCT URINALYSIS DIPSTICK
Bilirubin, UA: NEGATIVE
Glucose, UA: NEGATIVE
Ketones, UA: NEGATIVE
Leukocytes, UA: NEGATIVE
Nitrite, UA: NEGATIVE
Protein, UA: NEGATIVE
Spec Grav, UA: 1.02 (ref 1.010–1.025)
Urobilinogen, UA: 0.2 E.U./dL
pH, UA: 6 (ref 5.0–8.0)

## 2021-07-15 LAB — POC HEMOCCULT BLD/STL (OFFICE/1-CARD/DIAGNOSTIC)
Card #1 Date: 2282023
Fecal Occult Blood, POC: NEGATIVE

## 2021-07-15 MED ORDER — VALACYCLOVIR HCL 1 G PO TABS: 1000.0000 mg | ORAL_TABLET | Freq: Three times a day (TID) | ORAL | 0 refills | Status: AC

## 2021-07-15 NOTE — Progress Notes (Signed)
Right upper extremity venous duplex has been completed. Preliminary results can be found in CV Proc through chart review.  Results were given to [facility] at [facility] NP office.  07/15/21 3:19 PM Shannon Kane RVT

## 2021-07-15 NOTE — Patient Instructions (Signed)

## 2021-07-15 NOTE — Progress Notes (Signed)
Shannon Kane,acting as a Education administrator for [name], MD.,have documented all relevant documentation on the behalf of [name], MD,as directed by  [name], MD while in the presence of [name], MD.  This visit occurred during the SARS-CoV-2 public health emergency.  Safety protocols were in place, including screening questions prior to the visit, additional usage of staff PPE, and extensive cleaning of exam room while observing appropriate contact time as indicated for disinfecting solutions.  Subjective:     Patient ID: Shannon Kane , female    DOB: 11-13-1970 , 51 y.o.   MRN: [id number]   Chief Complaint  Patient presents with   Annual Exam    HPI  She is here today for a full physical examination.  She is followed by her GYN, Dr. [name], for her pelvic exams. Patient is concerned about having blood in her stool on 2/14 and 2/26. She states she saw blood on toilet tissue. She does admit to some constipation. Denies h/o hemorrhoids.  Hypertension This is a chronic problem. The current episode started more than 1 year ago. The problem has been gradually improving since onset. The problem is controlled. Associated symptoms include shortness of breath. Pertinent negatives include no blurred vision, chest pain, headaches or palpitations. Past treatments include angiotensin blockers. The current treatment provides moderate improvement. There are no compliance problems.     Past Medical History:  Diagnosis Date   Abnormal glucose    Breast cancer ([facility]) 03/2020   right breast IDC   Family history of breast cancer 04/03/2020   Family history of ovarian cancer 04/03/2020   History of radiation therapy    Right breast- 09/25/20-11/12/20- Dr. [name]   Hypertension    Hyperthyroidism    in the past   Hypothyroidism    Iron deficiency anemia    Palpitation    Personal history of chemotherapy    Spontaneous ecchymoses    Tachycardia    Vitamin D deficiency       Family History  Problem Relation Age of Onset   Diabetes Mother    Hypertension Mother    Leukemia Father        dx > 61   Ovarian cancer Maternal Grandmother 24   Bone cancer Paternal Grandfather        dx unknown age   Cancer Maternal Uncle        Mouth and throad; dx > 40   Breast cancer Other        MGM's niece, dx unknown age   Cancer Maternal Uncle        unknown type; dx > 50     Current Outpatient Medications:    amLODipine (NORVASC) 2.5 MG tablet, Take 1 tablet (2.5 mg total) by mouth daily., Disp: 90 tablet, Rfl: 3   Cholecalciferol (VITAMIN D-3 PO), Take 5,000 Units by mouth daily., Disp: , Rfl:    letrozole (FEMARA) 2.5 MG tablet, Take 1 tablet (2.5 mg total) by mouth daily., Disp: 90 tablet, Rfl: 0   MAGNESIUM CITRATE PO, Take 500 mcg by mouth at bedtime as needed (sleep). Only when having trouble sleeping, Disp: , Rfl:    SYNTHROID 75 MCG tablet, Take 1 tablet (75 mcg total) by mouth daily before breakfast., Disp: 90 tablet, Rfl: 1   valACYclovir (VALTREX) 1000 MG tablet, Take 1 tablet (1,000 mg total) by mouth 3 (three) times daily for 7 days., Disp: 21 tablet, Rfl: 0   valsartan (DIOVAN) 160 MG  tablet, Take 1 tablet (160 mg total) by mouth daily., Disp: 90 tablet, Rfl: 2   vitamin B-12 (CYANOCOBALAMIN) 1000 MCG tablet, Take 1,000 mcg by mouth daily., Disp: , Rfl:    Allergies  Allergen Reactions   Amoxicillin     Tolerates ANCEF   Ciprofloxacin Itching   Penicillins Hives    Tolerates ANCEF       The patient states she uses post menopausal status for birth control. Last LMP was Patient's last menstrual period was 12/14/2019.. Negative for Dysmenorrhea. Negative for: breast discharge, breast lump(s), breast pain and breast self exam. Associated symptoms include abnormal vaginal bleeding. Pertinent negatives include abnormal bleeding (hematology), anxiety, decreased libido, depression, difficulty falling sleep, dyspareunia, history of infertility, nocturia,  sexual dysfunction, sleep disturbances, urinary incontinence, urinary urgency, vaginal discharge and vaginal itching. Diet regular.The patient states her exercise level is  moderate.  . The patient's tobacco use is:  Social History   Tobacco Use  Smoking Status Never  Smokeless Tobacco Never  . She has been exposed to passive smoke. The patient's alcohol use is:  Social History   Substance and Sexual Activity  Alcohol Use Yes   Alcohol/week: 0.0 standard drinks   Comment: social    Review of Systems  Constitutional: Negative.   HENT: Negative.    Eyes: Negative.  Negative for blurred vision.  Respiratory:  Positive for shortness of breath.   Cardiovascular: Negative.  Negative for chest pain and palpitations.  Gastrointestinal:  Positive for blood in stool and constipation.  Endocrine: Negative.   Genitourinary: Negative.   Musculoskeletal: Negative.   Skin: Negative.   Allergic/Immunologic: Negative.   Neurological: Negative.  Negative for headaches.  Hematological: Negative.   Psychiatric/Behavioral: Negative.      Today's Vitals   07/15/21 0833  BP: 130/80  Pulse: 97  Temp: 98.1 F (36.7 C)  Weight: 192 lb 9.6 oz (87.4 kg)  Height: 5' 8.2" (1.732 m)  PainSc: 0-No pain   Body mass index is 29.11 kg/m.  Wt Readings from Last 3 Encounters:  07/15/21 195 lb 6.4 oz (88.6 kg)  07/15/21 192 lb 9.6 oz (87.4 kg)  06/09/21 192 lb 6 oz (87.3 kg)     Objective:  Physical Exam Vitals and nursing note reviewed.  Constitutional:      Appearance: Normal appearance.  HENT:     Head: Normocephalic and atraumatic.     Right Ear: Tympanic membrane, ear canal and external ear normal.     Left Ear: Tympanic membrane, ear canal and external ear normal.     Nose:     Comments: Masked     Mouth/Throat:     Comments: Masked  Eyes:     Extraocular Movements: Extraocular movements intact.     Conjunctiva/sclera: Conjunctivae normal.     Pupils: Pupils are equal, round, and  reactive to light.  Cardiovascular:     Rate and Rhythm: Normal rate and regular rhythm.     Pulses: Normal pulses.     Heart sounds: Normal heart sounds.  Pulmonary:     Effort: Pulmonary effort is normal.     Breath sounds: Normal breath sounds.  Abdominal:     General: Abdomen is flat. Bowel sounds are normal.     Palpations: Abdomen is soft.  Genitourinary:    Comments: deferred Musculoskeletal:        General: Normal range of motion.     Cervical back: Normal range of motion and neck supple.  Skin:  General: Skin is warm and dry.     Findings: Rash present.     Comments: Vesicular rash on right buttocks  Neurological:     General: No focal deficit present.     Mental Status: She is alert and oriented to person, place, and time.  Psychiatric:        Mood and Affect: Mood normal.        Behavior: Behavior normal.     Assessment And Plan:     1. Encounter for general adult medical examination w/o abnormal findings Comments: A full exam was performed. Importance of monthly self breast exams was discussed with the patient. PATIENT IS ADVISED TO GET 30-45 MINUTES REGULAR EXERCISE NO LESS THAN FOUR TO FIVE DAYS PER WEEK - BOTH WEIGHTBEARING EXERCISES AND AEROBIC ARE RECOMMENDED.  PATIENT IS ADVISED TO FOLLOW A HEALTHY DIET WITH AT LEAST SIX FRUITS/VEGGIES PER DAY, DECREASE INTAKE OF RED MEAT, AND TO INCREASE FISH INTAKE TO TWO DAYS PER WEEK.  MEATS/FISH SHOULD NOT BE FRIED, BAKED OR BROILED IS PREFERABLE.  IT IS ALSO IMPORTANT TO CUT BACK ON YOUR SUGAR INTAKE. PLEASE AVOID ANYTHING WITH ADDED SUGAR, CORN SYRUP OR OTHER SWEETENERS. IF YOU MUST USE A SWEETENER, YOU CAN TRY STEVIA. IT IS ALSO IMPORTANT TO AVOID ARTIFICIALLY SWEETENERS AND DIET BEVERAGES. LASTLY, I SUGGEST WEARING SPF 50 SUNSCREEN ON EXPOSED PARTS AND ESPECIALLY WHEN IN THE DIRECT SUNLIGHT FOR AN EXTENDED PERIOD OF TIME.  PLEASE AVOID FAST FOOD RESTAURANTS AND INCREASE YOUR WATER INTAKE.   2. Essential hypertension,  benign Comments: Chronic, controlled. EKG performed, NSR w/ LAE - this is NOT a new finding. Encouraged to follow low sodium diet, no med changes. F/u in six months.  - POCT Urinalysis Dipstick (81002) - Microalbumin / Creatinine Urine Ratio - EKG 12-Lead - CBC - CMP14+EGFR - Lipid panel  3. Primary hypothyroidism Comments: I will check thyroid panel and adjust meds as needed.  She will f/u in six months.  - TSH - T4, Free  4. Blood in stool Comments: I will check CBC. Rectal exam performed today. Brown stool in vault and guiaic negative.  - CBC - POC Hemoccult Bld/Stl (1-Cd Office Dx)  5. Shortness of breath Comments: She denies orthopnea. She has completed chemo w/ Herceptin, likely needs repeat echo. I will also refer her to Cardiology for further evaluation.   6. Vesicular rash Comments: I will send rx Valtrex 1gm tid x 7 days. I will test for HSV as requested.  - Varicella zoster antibody, IgM - valACYclovir (VALTREX) 1000 MG tablet; Take 1 tablet (1,000 mg total) by mouth 3 (three) times daily for 7 days.  Dispense: 21 tablet; Refill: 0 - HSV(herpes simplex vrs) 1+2 ab-IgG  7. Malignant neoplasm of upper-outer quadrant of right breast in female, estrogen receptor negative ([place]) Comments: She is s/p lumpectomy, XRT and chemo. Now on adjuvant therapy w/ letrozole.   8. Slow transit constipation Comments: She is encouraged to incorporate fiber supplement daily and to stay well hydrated. She will let me know if her sx persist.   9. Other abnormal glucose Comments: Her a1c has been elevated in the past. I will recheck an a1c today. She is encouraged to limit her intake of sweetened beverages, including diet drinks.  - Hemoglobin A1c  10. BMI 29.0-29.9,adult Comments: She is encouraged to aim for at least 150 minutes of exercise per week.   Patient was given opportunity to ask questions. Patient verbalized understanding of the plan and was able to  repeat key elements of the  plan. All questions were answered to their satisfaction.   I, [name], MD, have reviewed all documentation for this visit. The documentation on 07/15/21 for the exam, diagnosis, procedures, and orders are all accurate and complete.  THE PATIENT IS ENCOURAGED TO PRACTICE SOCIAL DISTANCING DUE TO THE COVID-19 PANDEMIC.

## 2021-07-15 NOTE — Progress Notes (Signed)
SURVIVORSHIP VISIT:   BRIEF ONCOLOGIC HISTORY:  Oncology History  Malignant neoplasm of upper-outer quadrant of right breast in female, estrogen receptor negative ([place])  03/28/2020 Initial Diagnosis   Patient palpated a right breast mass x2 wks. Mammogram and US showed a 2.0cm mass at the 10 o'clock position in the right breast and two enlarged right axillary lymph nodes, 2.0cm and 1.7cm. 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   04/16/2020 - 07/30/2020 Chemotherapy   TCHP x6 cycles, pathologic complete response, Herceptin Perjeta maintenance    04/18/2020 Genetic Testing   Negative genetic testing: no pathogenic variants detected in [facility] Multi-Cancer Panel.  Variants of uncertain significance detected in CDH1 at c.1289T>G (p.Val430Gly) and EGFR at c.2873G>A (p.Arg958His).  The report date is April 18, 2020.   The Multi-Cancer Panel offered by [facility] includes sequencing and/or deletion duplication testing of the following 85 genes: AIP, ALK, APC, ATM, AXIN2,BAP1,  BARD1, BLM, BMPR1A, BRCA1, BRCA2, BRIP1, CASR, CDC73, CDH1, CDK4, CDKN1B, CDKN1C, CDKN2A (p14ARF), CDKN2A (p16INK4a), CEBPA, CHEK2, CTNNA1, DICER1, DIS3L2, EGFR, EPCAM (Deletion/duplication testing only), FH, FLCN, GATA2, GPC3, GREM1 (Promoter region deletion/duplication testing only), HOXB13 (c.251G>A, p.Gly84Glu), HRAS, KIT, MAX, MEN1, MET, MITF (c.952G>A, p.Glu318Lys variant only), MLH1, MSH2, MSH3, MSH6, MUTYH, NBN, NF1, NF2, NTHL1, PALB2, PDGFRA, PHOX2B, PMS2, POLD1, POLE, POT1, PRKAR1A, PTCH1, PTEN, RAD50, RAD51C, RAD51D, RB1, RECQL4, RET, RNF43, RUNX1, SDHAF2, SDHA (sequence changes only), SDHB, SDHC, SDHD, SMAD4, SMARCA4, SMARCB1, SMARCE1, STK11, SUFU, TERC, TERT, TMEM127, TP53, TSC1, TSC2, VHL, WRN and WT1.    08/20/2020 Surgery   Right lumpectomy Shannon Kane): no residual carcinoma, 7 right axillary lymph nodes negative for carcinoma.   09/10/2020 - 04/15/2021  Chemotherapy   Patient is on Treatment Plan : BREAST Trastuzumab + Pertuzumab q21d      09/26/2020 - 11/12/2020 Radiation Therapy   Adjuvant radiation   01/21/2021 -  Anti-estrogen oral therapy   Letrozole     INTERVAL HISTORY:  Shannon Kane to review her survivorship care plan detailing her treatment course for breast cancer, as well as monitoring long-term side effects of that treatment, education regarding health maintenance, screening, and overall wellness and health promotion.     Overall, Shannon Kane reports feeling moderately well.  She continues on antiestrogen therapy with letrozole.  She is tolerating this well.  She notes some mild right cervical swelling in her neck.  She denies any recent illness or wound on the right side of her upper body.  REVIEW OF SYSTEMS:  Review of Systems  Constitutional:  Negative for appetite change, chills, fatigue, fever and unexpected weight change.  HENT:   Negative for hearing loss, lump/mass and trouble swallowing.   Eyes:  Negative for eye problems and icterus.  Respiratory:  Negative for chest tightness, cough and shortness of breath.   Cardiovascular:  Negative for chest pain, leg swelling and palpitations.  Gastrointestinal:  Negative for abdominal distention, abdominal pain, constipation, diarrhea, nausea and vomiting.  Endocrine: Negative for hot flashes.  Genitourinary:  Negative for difficulty urinating.   Musculoskeletal:  Negative for arthralgias.  Skin:  Negative for itching and rash.  Neurological:  Negative for dizziness, extremity weakness, headaches and numbness.  Hematological:  Negative for adenopathy. Does not bruise/bleed easily.  Psychiatric/Behavioral:  Negative for depression. The patient is not nervous/anxious.   Breast: Denies any new nodularity, masses, tenderness, nipple changes, or nipple discharge.      ONCOLOGY TREATMENT TEAM:  1. Surgeon:  Dr.  Ninfa Kane at [facility] 2. Medical Oncologist: Dr.  Lindi Adie 3. Radiation Oncologist: Dr. Sondra Come    PAST MEDICAL/SURGICAL HISTORY:  Past Medical History:  Diagnosis Date   Abnormal glucose    Breast cancer ([place]) 03/2020   right breast IDC   Family history of breast cancer 04/03/2020   Family history of ovarian cancer 04/03/2020   History of radiation therapy    Right breast- 09/25/20-11/12/20- Dr. [name]   Hypertension    Hyperthyroidism    in the past   Hypothyroidism    Iron deficiency anemia    Palpitation    Personal history of chemotherapy    Spontaneous ecchymoses    Tachycardia    Vitamin D deficiency    Past Surgical History:  Procedure Laterality Date   BREAST BIOPSY     BREAST EXCISIONAL BIOPSY     BREAST LUMPECTOMY Right 08/20/2020   w/ rad w/ chemo   BREAST LUMPECTOMY WITH RADIOACTIVE SEED LOCALIZATION Right 08/20/2020   Procedure: RADIOACTIVE SEED GUIDED RIGHT BREAST LUMPECTOMY;  Surgeon: Coralie Keens, MD;  Location: [place];  Service: General;  Laterality: Right;   CHOLECYSTECTOMY     COLONOSCOPY  05/18/2018   KNEE SURGERY     PORT-A-CATH REMOVAL N/A 04/30/2021   Procedure: REMOVAL PORT-A-CATH;  Surgeon: Coralie Keens, MD;  Location: [place];  Service: General;  Laterality: N/A;   PORTACATH PLACEMENT Left 04/15/2020   Procedure: INSERTION PORT-A-CATH;  Surgeon: Coralie Keens, MD;  Location: [place];  Service: General;  Laterality: Left;   RADIOACTIVE SEED GUIDED AXILLARY SENTINEL LYMPH NODE Right 08/20/2020   Procedure: RADIOACTIVE SEED TARGETED RIGHT AXILLARY LYMPH NODE DISSECTION;  Surgeon: Coralie Keens, MD;  Location: [place];  Service: General;  Laterality: Right;   TONSILLECTOMY       ALLERGIES:  Allergies  Allergen Reactions   Amoxicillin     Tolerates ANCEF   Ciprofloxacin Itching   Penicillins Hives    Tolerates ANCEF      CURRENT MEDICATIONS:  Outpatient Encounter Medications as of 07/15/2021  Medication Sig   amLODipine (NORVASC)  2.5 MG tablet Take 1 tablet (2.5 mg total) by mouth daily.   Cholecalciferol (VITAMIN D-3 PO) Take 5,000 Units by mouth daily.   letrozole (FEMARA) 2.5 MG tablet Take 1 tablet (2.5 mg total) by mouth daily.   MAGNESIUM CITRATE PO Take 500 mcg by mouth at bedtime as needed (sleep). Only when having trouble sleeping   SYNTHROID 75 MCG tablet Take 1 tablet (75 mcg total) by mouth daily before breakfast.   valsartan (DIOVAN) 160 MG tablet Take 1 tablet (160 mg total) by mouth daily.   vitamin B-12 (CYANOCOBALAMIN) 1000 MCG tablet Take 1,000 mcg by mouth daily.   valACYclovir (VALTREX) 1000 MG tablet Take 1 tablet (1,000 mg total) by mouth 3 (three) times daily for 7 days.   [DISCONTINUED] doxycycline (VIBRA-TABS) 100 MG tablet Take 1 tablet (100 mg total) by mouth 2 (two) times daily.   [DISCONTINUED] lidocaine-prilocaine (EMLA) cream Apply 1 application topically as needed. (Patient not taking: Reported on 04/22/2021)   [DISCONTINUED] prochlorperazine (COMPAZINE) 10 MG tablet Take 1 tablet (10 mg total) by mouth every 6 (six) hours as needed (Nausea or vomiting).   No facility-administered encounter medications on file as of 07/15/2021.     ONCOLOGIC FAMILY HISTORY:  Family History  Problem Relation Age of Onset   Diabetes Mother    Hypertension Mother    Leukemia Father  dx > 50   Ovarian cancer Maternal Grandmother 59   Bone cancer Paternal Grandfather        dx unknown age   Cancer Maternal Uncle        Mouth and throad; dx > 20   Breast cancer Other        MGM's niece, dx unknown age   62 Maternal Uncle        unknown type; dx > 50     GENETIC COUNSELING/TESTING: See above  SOCIAL HISTORY:  Social History   Socioeconomic History   Marital status: Married    Spouse name: Not on file   Number of children: Not on file   Years of education: Not on file   Highest education level: Not on file  Occupational History   Not on file  Tobacco Use   Smoking status: Never    Smokeless tobacco: Never  Vaping Use   Vaping Use: Never used  Substance and Sexual Activity   Alcohol use: Yes    Alcohol/week: 0.0 standard drinks    Comment: social   Drug use: Never   Sexual activity: Not Currently    Birth control/protection: None  Other Topics Concern   Not on file  Social History Narrative   Not on file   Social Determinants of Health   Financial Resource Strain: Not on file  Food Insecurity: Not on file  Transportation Needs: Not on file  Physical Activity: Not on file  Stress: Not on file  Social Connections: Not on file  Intimate Partner Violence: Not on file     OBSERVATIONS/OBJECTIVE:  BP (!) 126/94 (BP Location: Left Arm, Patient Position: Sitting, Cuff Size: Large)    Pulse (!) 113    Temp 98.1 F (36.7 C) (Tympanic)    Resp 18    Ht 5' 8.2" (1.732 m)    Wt 195 lb 6.4 oz (88.6 kg)    LMP 12/14/2019    SpO2 100%    BMI 29.54 kg/m  GENERAL: Patient is a well appearing female in no acute distress HEENT:  Sclerae anicteric.  Oropharynx clear and moist. No ulcerations or evidence of oropharyngeal candidiasis. Neck is supple.  NODES:  No cervical, supraclavicular, or axillary lymphadenopathy palpated.  BREAST EXAM: Right breast status postlumpectomy and radiation no sign of local recurrence left breast is benign LUNGS:  Clear to auscultation bilaterally.  No wheezes or rhonchi. HEART:  Regular rate and rhythm. No murmur appreciated. ABDOMEN:  Soft, nontender.  Positive, normoactive bowel sounds. No organomegaly palpated. MSK:  No focal spinal tenderness to palpation. Full range of motion bilaterally in the upper extremities. EXTREMITIES:  No peripheral edema.   SKIN:  Clear with no obvious rashes or skin changes. No nail dyscrasia. NEURO:  Nonfocal. Well oriented.  Appropriate affect.   LABORATORY DATA:  None for this visit.  DIAGNOSTIC IMAGING:  None for this visit.      ASSESSMENT AND PLAN:  Ms.. Kane is a pleasant 51 y.o. 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  She presents to the Survivorship Clinic for our initial meeting and routine follow-up post-completion of treatment for breast cancer.    1. Stage 2A right breast cancer:  Shannon Kane is continuing to recover from definitive treatment for breast cancer. She will follow-up with her medical oncologist, Dr. Lindi Adie  in 6 months with history and physical exam per surveillance protocol.  She will continue her anti-estrogen therapy with letrozole. Thus far, she is tolerating the letrozole well, with minimal side effects. She was instructed to make Dr. Lindi Adie or myself aware if she begins to experience any worsening side effects of the medication and I could see her back in clinic to help manage those side effects, as needed. Her mammogram is due October 2023; orders placed today. Today, a comprehensive survivorship care plan and treatment summary was reviewed with the patient today detailing her breast cancer diagnosis, treatment course, potential late/long-term effects of treatment, appropriate follow-up care with recommendations for the future, and patient education resources.  A copy of this summary, along with a letter will be sent to the patients primary care provider via mail/fax/In Basket message after todays visit.    2. Neck swelling: We will get Doppler of the right upper extremity to further evaluate.  If negative will consider CT neck.  I did order an echocardiogram since it had been a few months since she had had her most recent echo while being on Herceptin based therapy.  3. Bone health:  Given Shannon Kane's age/history of breast cancer and her current treatment regimen including anti-estrogen therapy with letrozole, she is at  risk for bone demineralization.  Her last DEXA scan was August 2022, which showed osteopenia with a T score of -1.9 in the lumbar spine.  She will repeat this in August 2024.  She was given education on specific activities to promote bone health.  4. Cancer screening:  Due to Shannon Kane's history and her age, she should receive screening for skin cancers, colon cancer, and gynecologic cancers.  The information and recommendations are listed on the patient's comprehensive care plan/treatment summary and were reviewed in detail with the patient.    5. Health maintenance and wellness promotion: Shannon Kane was encouraged to consume 5-7 servings of fruits and vegetables per day. We reviewed the "Nutrition Rainbow" handout. She was also encouraged to engage in moderate to vigorous exercise for 30 minutes per day most days of the week. We discussed the [employer] fitness program, which is designed for cancer survivors to help them become more physically fit after cancer treatments.  She was instructed to limit her alcohol consumption and continue to abstain from tobacco use.     6. Support services/counseling: It is not uncommon for this period of the patient's cancer care trajectory to be one of many emotions and stressors.  She was given information regarding our available services and encouraged to contact me with any questions or for help enrolling in any of our support group/programs.    Follow up instructions:    -Return to cancer center in 6 months for follow-up with Dr. Lindi Adie -Mammogram due in October 2023 -Follow up with surgery in 1 year -She is welcome to return back to the Survivorship Clinic at any time; no additional follow-up needed at this time.  -Consider referral back to survivorship as a long-term survivor for continued surveillance  The patient was provided an opportunity to ask questions and all were answered. The patient agreed with the plan and demonstrated an understanding of  the instructions.   Total encounter time: 45 minutes in face-to-face visit time, chart review, lab review, order entry, care coordination, and documentation of the encounter.    Wilber Bihari, NP 07/15/21 11:23 AM Medical Oncology and Hematology [facility] [place], [place] [address]  Tel. [phone]    Fax. [phone]  *Total Encounter Time as defined by the Centers for Medicare and Medicaid Services includes, in addition to the face-to-face time of a patient visit (documented in the note above) non-face-to-face time: obtaining and reviewing outside history, ordering and reviewing medications, tests or procedures, care coordination (communications with other health care professionals or caregivers) and documentation in the medical record.

## 2021-07-16 ENCOUNTER — Telehealth: Payer: Self-pay | Admitting: Adult Health

## 2021-07-16 ENCOUNTER — Ambulatory Visit (HOSPITAL_COMMUNITY)

## 2021-07-16 LAB — LIPID PANEL
Chol/HDL Ratio: 3.8 ratio (ref 0.0–4.4)
Cholesterol, Total: 212 mg/dL — ABNORMAL HIGH (ref 100–199)
HDL: 56 mg/dL (ref >39)
LDL Chol Calc (NIH): 141 mg/dL — ABNORMAL HIGH (ref 0–99)
Triglycerides: 83 mg/dL (ref 0–149)
VLDL Cholesterol Cal: 15 mg/dL (ref 5–40)

## 2021-07-16 LAB — MICROALBUMIN / CREATININE URINE RATIO
Creatinine, Urine: 182.9 mg/dL
Microalb/Creat Ratio: 6 mg/g creat (ref 0–29)
Microalbumin, Urine: 11 ug/mL

## 2021-07-16 LAB — CMP14+EGFR
ALT: 16 IU/L (ref 0–32)
AST: 22 IU/L (ref 0–40)
Albumin/Globulin Ratio: 1.5 (ref 1.2–2.2)
Albumin: 4.6 g/dL (ref 3.8–4.9)
Alkaline Phosphatase: 103 IU/L (ref 44–121)
BUN/Creatinine Ratio: 16 (ref 9–23)
BUN: 14 mg/dL (ref 6–24)
Bilirubin Total: 0.4 mg/dL (ref 0.0–1.2)
CO2: 25 mmol/L (ref 20–29)
Calcium: 10 mg/dL (ref 8.7–10.2)
Chloride: 106 mmol/L (ref 96–106)
Creatinine, Ser: 0.88 mg/dL (ref 0.57–1.00)
Globulin, Total: 3.1 g/dL (ref 1.5–4.5)
Glucose: 101 mg/dL — ABNORMAL HIGH (ref 70–99)
Potassium: 4.6 mmol/L (ref 3.5–5.2)
Sodium: 145 mmol/L — ABNORMAL HIGH (ref 134–144)
Total Protein: 7.7 g/dL (ref 6.0–8.5)
eGFR: 79 mL/min/{1.73_m2} (ref >59)

## 2021-07-16 LAB — HSV(HERPES SIMPLEX VRS) I + II AB-IGG
HSV 1 Glycoprotein G Ab, IgG: 31.1 index — ABNORMAL HIGH (ref 0.00–0.90)
HSV 2 IgG, Type Spec: >23.6 index — ABNORMAL HIGH (ref 0.00–0.90)

## 2021-07-16 LAB — CBC
Hematocrit: 42.2 % (ref 34.0–46.6)
Hemoglobin: 14.4 g/dL (ref 11.1–15.9)
MCH: 31.6 pg (ref 26.6–33.0)
MCHC: 34.1 g/dL (ref 31.5–35.7)
MCV: 93 fL (ref 79–97)
Platelets: 228 10*3/uL (ref 150–450)
RBC: 4.55 x10E6/uL (ref 3.77–5.28)
RDW: 13.1 % (ref 11.7–15.4)
WBC: 5 10*3/uL (ref 3.4–10.8)

## 2021-07-16 LAB — HEMOGLOBIN A1C
Est. average glucose Bld gHb Est-mCnc: 137 mg/dL
Hgb A1c MFr Bld: 6.4 % — ABNORMAL HIGH (ref 4.8–5.6)

## 2021-07-16 LAB — VARICELLA ZOSTER ANTIBODY, IGM: Varicella IgM: <0.91 index (ref 0.00–0.90)

## 2021-07-16 LAB — TSH: TSH: 2.04 u[IU]/mL (ref 0.450–4.500)

## 2021-07-16 LAB — T4, FREE: Free T4: 1.25 ng/dL (ref 0.82–1.77)

## 2021-07-16 NOTE — Telephone Encounter (Signed)
Scheduled appointment per 2/28 los. Left a voicemail. Patient will be mailed an updated calendar. ?

## 2021-07-20 ENCOUNTER — Encounter: Payer: Self-pay | Admitting: Adult Health

## 2021-07-20 ENCOUNTER — Encounter: Payer: Self-pay | Admitting: Internal Medicine

## 2021-07-21 ENCOUNTER — Telehealth: Payer: Self-pay

## 2021-07-21 NOTE — Telephone Encounter (Signed)
Called pt regarding mychart message.  Pt concerned about cholesterol/weight changes and side effects from letrozole.  I explained to pt how medication is working to prevent recurrence of cancer.  Pt agreed to continue taking medication for a few more months and observe changes in cholesterol - pt will have labs drawn again at PCP and then call us regarding results and her decision for risk benefit of continuing on meds.  Pt verbalized understanding and thanks.  ?

## 2021-07-22 ENCOUNTER — Other Ambulatory Visit: Payer: Self-pay

## 2021-07-22 ENCOUNTER — Ambulatory Visit (HOSPITAL_COMMUNITY)
Admission: RE | Admit: 2021-07-22 | Discharge: 2021-07-22 | Disposition: A | Discharge status: Home / Self Care | Source: Ambulatory Visit | Attending: Adult Health | Admitting: Adult Health

## 2021-07-22 DIAGNOSIS — Z0189 Encounter for other specified special examinations: Secondary | ICD-10-CM | POA: Diagnosis not present

## 2021-07-22 DIAGNOSIS — Z01818 Encounter for other preprocedural examination: Secondary | ICD-10-CM | POA: Diagnosis not present

## 2021-07-22 DIAGNOSIS — C50411 Malignant neoplasm of upper-outer quadrant of right female breast: Secondary | ICD-10-CM | POA: Insufficient documentation

## 2021-07-22 DIAGNOSIS — Z171 Estrogen receptor negative status [ER-]: Secondary | ICD-10-CM | POA: Diagnosis not present

## 2021-07-22 LAB — ECHOCARDIOGRAM COMPLETE
AR max vel: 2.02 cm2
AV Area VTI: 2.28 cm2
AV Area mean vel: 1.99 cm2
AV Mean grad: 3 mmHg
AV Peak grad: 6.2 mmHg
Ao pk vel: 1.24 m/s
Area-P 1/2: 3.21 cm2
Calc EF: 62 %
S' Lateral: 2.2 cm
Single Plane A2C EF: 62 %
Single Plane A4C EF: 62.6 %

## 2021-07-22 NOTE — Progress Notes (Signed)
? ?  Echocardiogram ?2D Echocardiogram has been performed. ? ?Beryle Beams ?07/22/2021, 2:35 PM ?

## 2021-07-23 ENCOUNTER — Other Ambulatory Visit: Payer: Self-pay

## 2021-07-23 DIAGNOSIS — R3129 Other microscopic hematuria: Secondary | ICD-10-CM

## 2021-08-04 ENCOUNTER — Other Ambulatory Visit: Payer: Self-pay

## 2021-08-04 DIAGNOSIS — I10 Essential (primary) hypertension: Secondary | ICD-10-CM

## 2021-08-04 MED ORDER — AMLODIPINE BESYLATE 2.5 MG PO TABS: 2.5000 mg | ORAL_TABLET | Freq: Every day | ORAL | 1 refills | Status: DC

## 2021-09-08 ENCOUNTER — Ambulatory Visit: Attending: Hematology and Oncology

## 2021-09-08 VITALS — Wt 194.2 lb

## 2021-09-08 DIAGNOSIS — Z483 Aftercare following surgery for neoplasm: Secondary | ICD-10-CM | POA: Insufficient documentation

## 2021-09-08 NOTE — Therapy (Signed)
?OUTPATIENT PHYSICAL THERAPY SOZO SCREENING NOTE ? ? ?Patient Name: Shannon Kane ?MRN: [id number] ?DOB:12-14-1970, 51 y.o., female ?Today's Date: 09/08/2021 ? ?PCP: [name], MD ?REFERRING PROVIDER: Nicholas Lose, MD ? ? PT End of Session - 09/08/21 1637   ? ? Visit Number 9   # unchanged due to screen only  ? PT Start Time 8676   ? PT Stop Time 1640   ? PT Time Calculation (min) 5 min   ? Activity Tolerance Patient tolerated treatment well   ? Behavior During Therapy [facility] for tasks assessed/performed   ? ?  ?  ? ?  ? ? ?Past Medical History:  ?Diagnosis Date  ? Abnormal glucose   ? Breast cancer ([place]) 03/2020  ? right breast IDC  ? Family history of breast cancer 04/03/2020  ? Family history of ovarian cancer 04/03/2020  ? History of radiation therapy   ? Right breast- 09/25/20-11/12/20- Dr. [name]  ? Hypertension   ? Hyperthyroidism   ? in the past  ? Hypothyroidism   ? Iron deficiency anemia   ? Palpitation   ? Personal history of chemotherapy   ? Spontaneous ecchymoses   ? Tachycardia   ? Vitamin D deficiency   ? ?Past Surgical History:  ?Procedure Laterality Date  ? BREAST BIOPSY    ? BREAST EXCISIONAL BIOPSY    ? BREAST LUMPECTOMY Right 08/20/2020  ? w/ rad w/ chemo  ? BREAST LUMPECTOMY WITH RADIOACTIVE SEED LOCALIZATION Right 08/20/2020  ? Procedure: RADIOACTIVE SEED GUIDED RIGHT BREAST LUMPECTOMY;  Surgeon: Coralie Keens, MD;  Location: [place];  Service: General;  Laterality: Right;  ? CHOLECYSTECTOMY    ? COLONOSCOPY  05/18/2018  ? KNEE SURGERY    ? PORT-A-CATH REMOVAL N/A 04/30/2021  ? Procedure: REMOVAL PORT-A-CATH;  Surgeon: Coralie Keens, MD;  Location: [place];  Service: General;  Laterality: N/A;  ? PORTACATH PLACEMENT Left 04/15/2020  ? Procedure: INSERTION PORT-A-CATH;  Surgeon: Coralie Keens, MD;  Location: [place];  Service: General;  Laterality: Left;  ? RADIOACTIVE SEED GUIDED AXILLARY SENTINEL LYMPH NODE Right 08/20/2020  ? Procedure:  RADIOACTIVE SEED TARGETED RIGHT AXILLARY LYMPH NODE DISSECTION;  Surgeon: Coralie Keens, MD;  Location: [place];  Service: General;  Laterality: Right;  ? TONSILLECTOMY    ? ?Patient Active Problem List  ? Diagnosis Date Noted  ? Constipation 07/15/2021  ? Chronic idiopathic constipation 07/15/2021  ? Dysphagia 07/15/2021  ? Gastroesophageal reflux disease 07/15/2021  ? Uterine leiomyoma 04/19/2020  ? Genetic testing 04/19/2020  ? Family history of ovarian cancer 04/03/2020  ? Family history of leukemia 04/03/2020  ? Family history of breast cancer 04/03/2020  ? Malignant neoplasm of upper-outer quadrant of right breast in female, estrogen receptor negative ([place]) 03/28/2020  ? Overweight (BMI 25.0-29.9) 12/05/2019  ? Essential hypertension, benign 05/29/2018  ? Iritis of left eye 05/29/2018  ? Iron deficiency anemia due to chronic blood loss 05/29/2018  ? Primary hypothyroidism 05/29/2018  ? ? ?REFERRING DIAG: right breast cancer at risk for lymphedema ? ?THERAPY DIAG:  ?Aftercare following surgery for neoplasm ? ?PERTINENT HISTORY: Patient was diagnosed on 03/14/2020 with right grade 2 invasive ductal carcinoma breast cancer.  She underwent neoadjuvant chemo followed by a right lumpectomy and targeted node dissection (7 negative lymph nodes) on 08/20/2020. It is ER/PR negative and HER2 positive with a Ki67 of 30%. She has a positive axillary lymph node. Still has port on the left side for infusion for Herception, Projeta. Port removed  on 04/30/2021  ? ?PRECAUTIONS: right UE Lymphedema risk, None ? ?SUBJECTIVE: Pt returns for her 3 month L-Dex screen.  ? ?PAIN:  ?Are you having pain? No ? ?SOZO SCREENING: ?Patient was assessed today using the SOZO machine to determine the lymphedema index score. This was compared to her baseline score. It was determined that she is within the recommended range when compared to her baseline and no further action is needed at this time. She will continue SOZO screenings. These are  done every 3 months for 2 years post operatively followed by every 6 months for 2 years, and then annually. ? ? ?Otelia Limes, PTA ?09/08/2021, 4:39 PM ? ?  ? ?

## 2021-10-03 ENCOUNTER — Other Ambulatory Visit: Payer: Self-pay | Admitting: Internal Medicine

## 2021-11-22 ENCOUNTER — Other Ambulatory Visit: Payer: Self-pay | Admitting: Cardiology

## 2021-11-22 LAB — GLUCOSE, POCT (MANUAL RESULT ENTRY): POC Glucose: 136 mg/dl — AB (ref 70–99)

## 2021-11-25 ENCOUNTER — Encounter (HOSPITAL_COMMUNITY): Payer: Self-pay

## 2021-11-25 LAB — LIPID PANEL W/O CHOL/HDL RATIO
Cholesterol, Total: 194 mg/dL (ref 100–199)
HDL: 53 mg/dL (ref >39)
LDL Chol Calc (NIH): 126 mg/dL — ABNORMAL HIGH (ref 0–99)
Triglycerides: 82 mg/dL (ref 0–149)
VLDL Cholesterol Cal: 15 mg/dL (ref 5–40)

## 2021-11-26 ENCOUNTER — Other Ambulatory Visit: Payer: Self-pay

## 2021-11-26 IMAGING — MG DIGITAL DIAGNOSTIC BILAT W/ TOMO W/ CAD
8 of 13 series · 8 of 37 positions shown · non-contrast
Comparison: Previous exam(s).

CLINICAL DATA: 50-year-old female presenting for annual exam.
History of right breast cancer status post lumpectomy in August 2020.

EXAM:
DIGITAL DIAGNOSTIC BILATERAL MAMMOGRAM WITH TOMOSYNTHESIS AND CAD
TECHNIQUE: Bilateral digital diagnostic mammography and breast tomosynthesis
was performed. The images were evaluated with computer-aided
detection.

[R MLO]
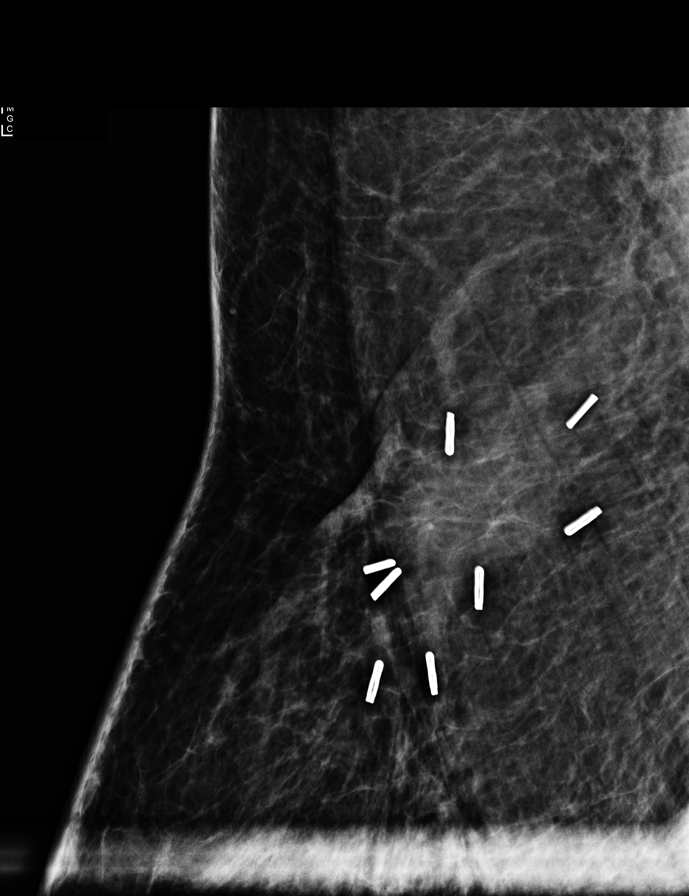

[L CC synth-2D]
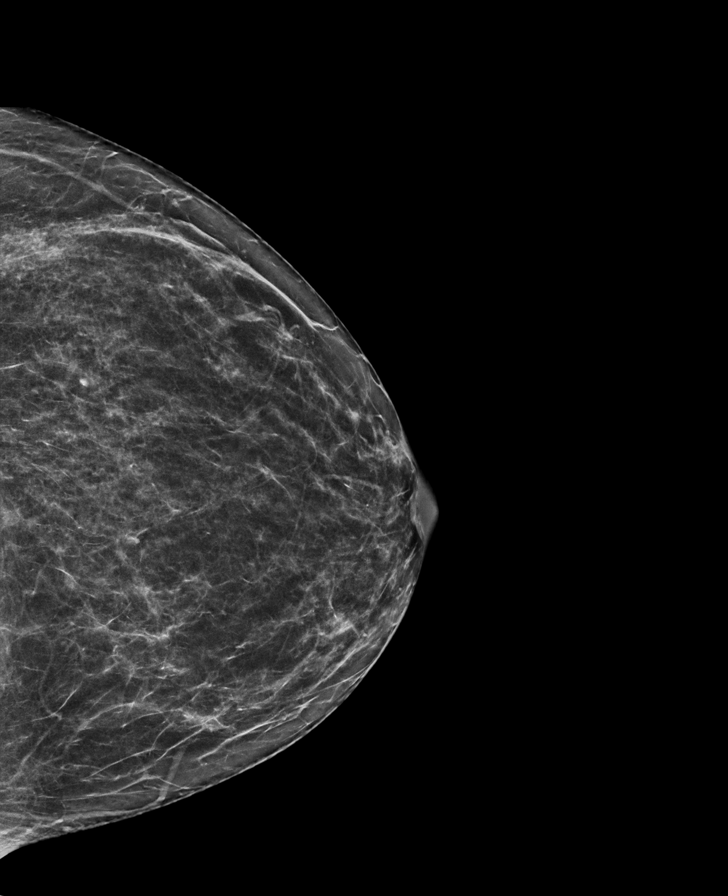

[R MLO synth-2D]
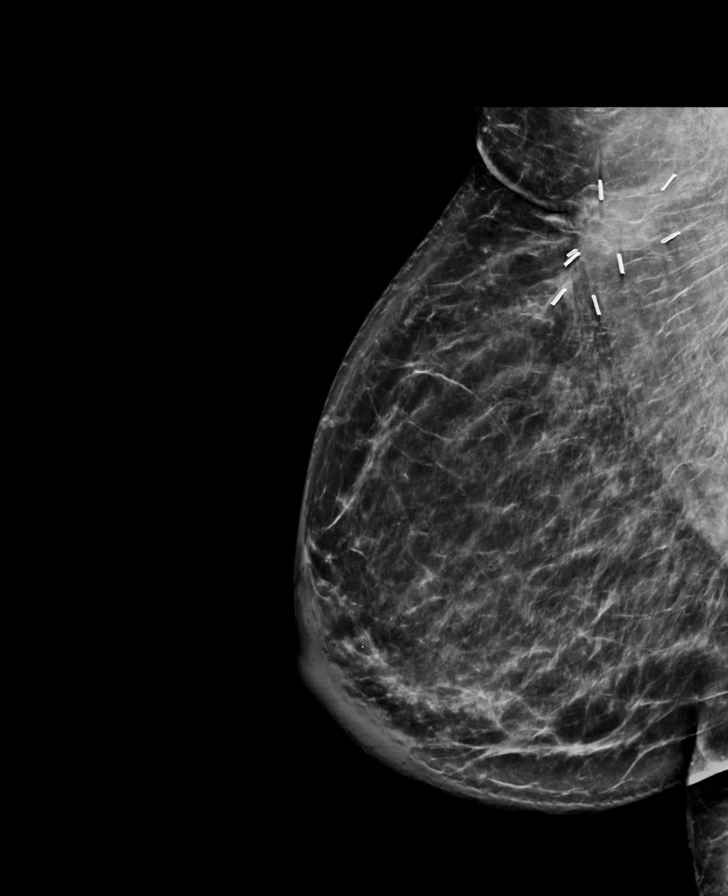

[L MLO synth-2D (1 of 2)]
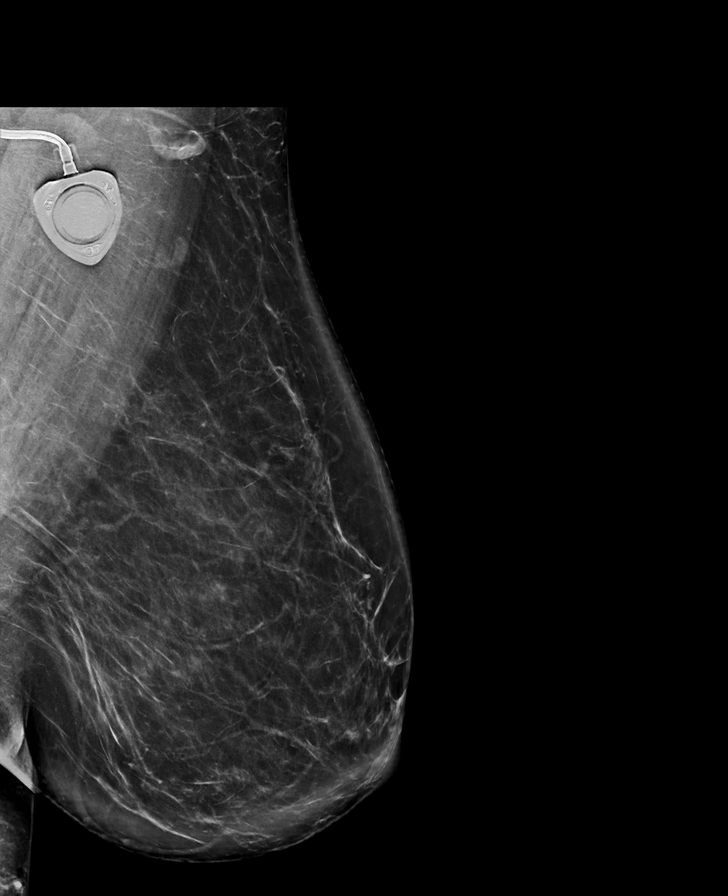

[L MLO synth-2D (2 of 2)]
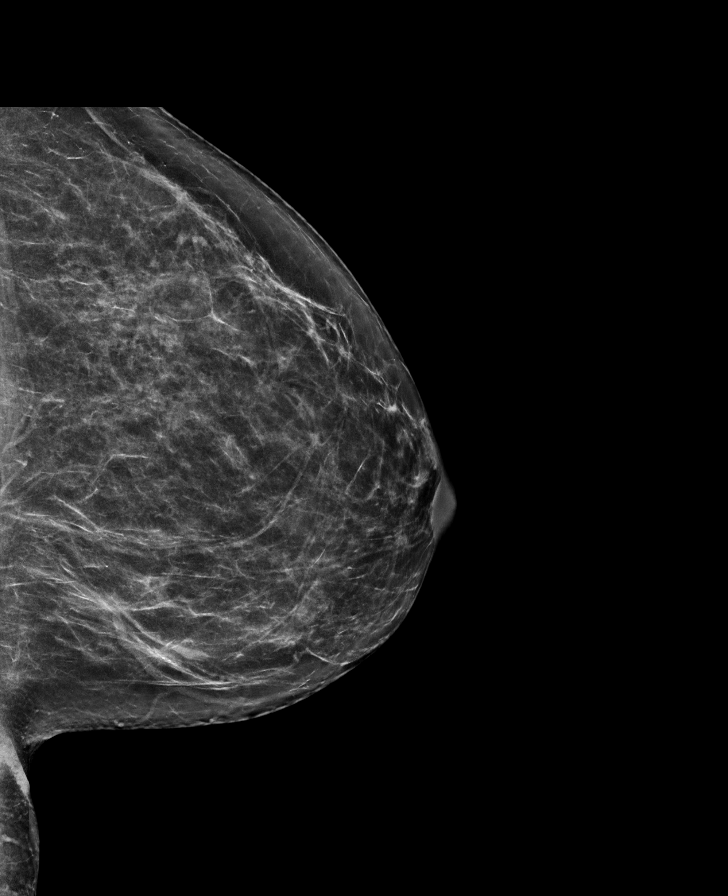

[R XCCL synth-2D]
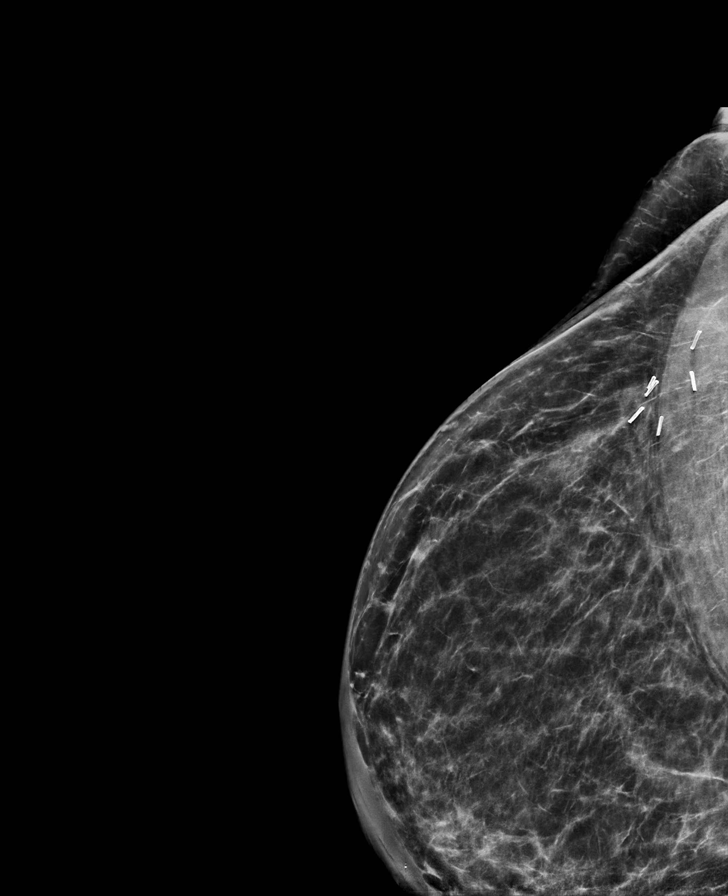

[R CC synth-2D]
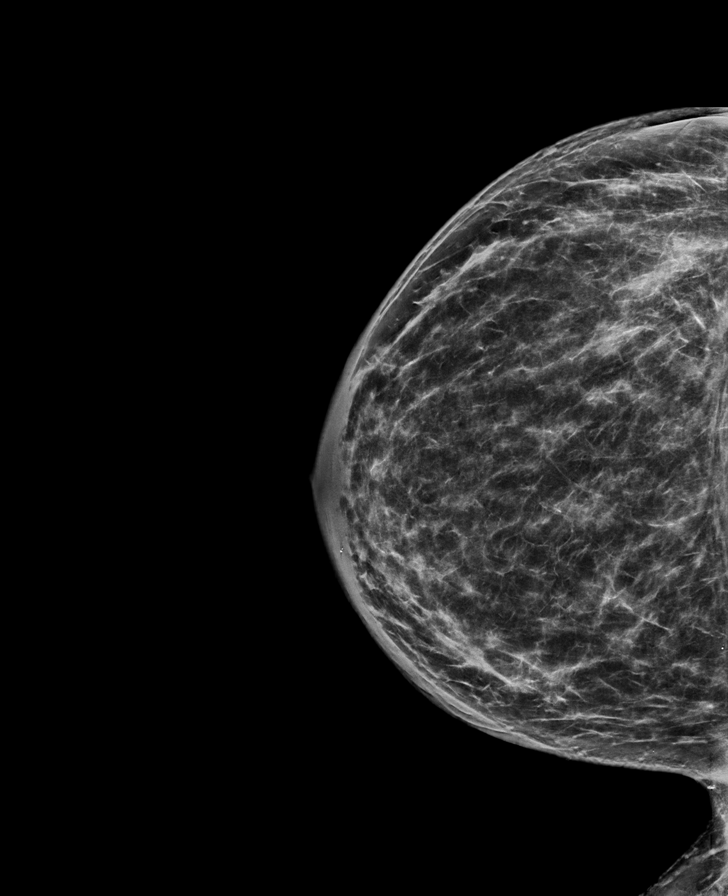

[R CC tomo · tomo slice 42/83.0]
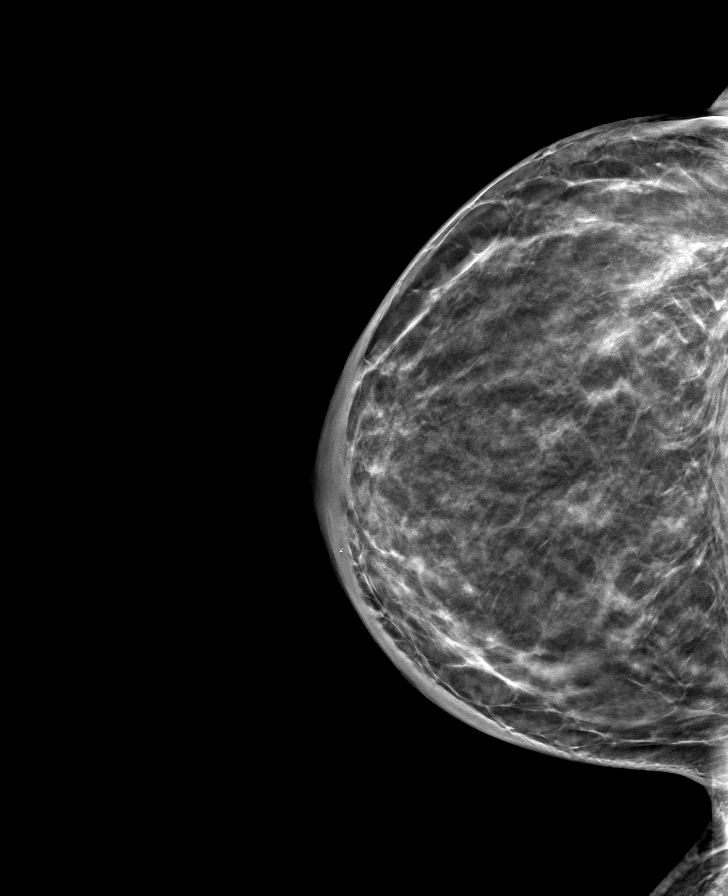

[8 of 37 positions shown; findings below may reference images not displayed]

ACR Breast Density Category b: There are scattered areas of
fibroglandular density.
FINDINGS: Right breast: A spot 2D magnification view of the lumpectomy site
was performed in addition to standard views. There are expected
postsurgical changes. No suspicious mass, distortion, or
microcalcifications are identified to suggest presence of
malignancy.

Left breast: No suspicious mass, distortion, or microcalcifications
are identified to suggest presence of malignancy.
IMPRESSION: Benign postsurgical changes in the right breast. No mammographic
evidence of malignancy bilaterally.

RECOMMENDATION:
Diagnostic bilateral mammogram in 1 year.

I have discussed the findings and recommendations with the patient.
If applicable, a reminder letter will be sent to the patient
regarding the next appointment.

BI-RADS CATEGORY  2: Benign.

## 2021-11-27 LAB — SPECIMEN STATUS REPORT

## 2021-11-27 LAB — FECAL OCCULT BLOOD, IMMUNOCHEMICAL: Fecal Occult Bld: NEGATIVE

## 2021-12-10 ENCOUNTER — Telehealth: Payer: Self-pay

## 2021-12-10 NOTE — Telephone Encounter (Signed)
Called patient via to give lab results from free screening.  Total Cholesterol: 194 Triglycerides: 82 HDL Cholesterol: 53 LDL Cholesterol: 126  FIT Test- Negative  Patient voiced understanding. Patient will FU with PCP on 01/12/22 and Oncologist on 01/13/22.

## 2021-12-29 ENCOUNTER — Ambulatory Visit: Attending: Hematology and Oncology

## 2021-12-29 VITALS — Wt 199.5 lb

## 2021-12-29 DIAGNOSIS — Z483 Aftercare following surgery for neoplasm: Secondary | ICD-10-CM | POA: Insufficient documentation

## 2021-12-29 NOTE — Therapy (Signed)
OUTPATIENT PHYSICAL THERAPY SOZO SCREENING NOTE   Patient Name: Shannon Kane MRN: [id number] DOB:1971-02-16, 51 y.o., female Today's Date: 12/29/2021  PCP: [name], MD REFERRING PROVIDER: Nicholas Lose, MD   PT End of Session - 12/29/21 0813     Visit Number 9   # unchanged due to screen only   PT Start Time 0811    PT Stop Time 0819    PT Time Calculation (min) 8 min    Activity Tolerance Patient tolerated treatment well    Behavior During Therapy [facility] for tasks assessed/performed             Past Medical History:  Diagnosis Date   Abnormal glucose    Breast cancer ([place]) 03/2020   right breast IDC   Family history of breast cancer 04/03/2020   Family history of ovarian cancer 04/03/2020   History of radiation therapy    Right breast- 09/25/20-11/12/20- Dr. [name]   Hypertension    Hyperthyroidism    in the past   Hypothyroidism    Iron deficiency anemia    Palpitation    Personal history of chemotherapy    Spontaneous ecchymoses    Tachycardia    Vitamin D deficiency    Past Surgical History:  Procedure Laterality Date   BREAST BIOPSY     BREAST EXCISIONAL BIOPSY     BREAST LUMPECTOMY Right 08/20/2020   w/ rad w/ chemo   BREAST LUMPECTOMY WITH RADIOACTIVE SEED LOCALIZATION Right 08/20/2020   Procedure: RADIOACTIVE SEED GUIDED RIGHT BREAST LUMPECTOMY;  Surgeon: Coralie Keens, MD;  Location: [place];  Service: General;  Laterality: Right;   CHOLECYSTECTOMY     COLONOSCOPY  05/18/2018   KNEE SURGERY     PORT-A-CATH REMOVAL N/A 04/30/2021   Procedure: REMOVAL PORT-A-CATH;  Surgeon: Coralie Keens, MD;  Location: [place];  Service: General;  Laterality: N/A;   PORTACATH PLACEMENT Left 04/15/2020   Procedure: INSERTION PORT-A-CATH;  Surgeon: Coralie Keens, MD;  Location: [place];  Service: General;  Laterality: Left;   RADIOACTIVE SEED GUIDED AXILLARY SENTINEL LYMPH NODE Right 08/20/2020   Procedure:  RADIOACTIVE SEED TARGETED RIGHT AXILLARY LYMPH NODE DISSECTION;  Surgeon: Coralie Keens, MD;  Location: [place];  Service: General;  Laterality: Right;   TONSILLECTOMY     Patient Active Problem List   Diagnosis Date Noted   Constipation 07/15/2021   Chronic idiopathic constipation 07/15/2021   Dysphagia 07/15/2021   Gastroesophageal reflux disease 07/15/2021   Uterine leiomyoma 04/19/2020   Genetic testing 04/19/2020   Family history of ovarian cancer 04/03/2020   Family history of leukemia 04/03/2020   Family history of breast cancer 04/03/2020   Malignant neoplasm of upper-outer quadrant of right breast in female, estrogen receptor negative ([place]) 03/28/2020   Overweight (BMI 25.0-29.9) 12/05/2019   Essential hypertension, benign 05/29/2018   Iritis of left eye 05/29/2018   Iron deficiency anemia due to chronic blood loss 05/29/2018   Primary hypothyroidism 05/29/2018    REFERRING DIAG: right breast cancer at risk for lymphedema  THERAPY DIAG:  Aftercare following surgery for neoplasm  PERTINENT HISTORY: Patient was diagnosed on 03/14/2020 with right grade 2 invasive ductal carcinoma breast cancer.  She underwent neoadjuvant chemo followed by a right lumpectomy and targeted node dissection (7 negative lymph nodes) on 08/20/2020. It is ER/PR negative and HER2 positive with a Ki67 of 30%. She has a positive axillary lymph node. Still has port on the left side for infusion for Herception, Projeta. Port removed  on 04/30/2021   PRECAUTIONS: right UE Lymphedema risk, None  SUBJECTIVE: Pt returns for her 3 month L-Dex screen.   PAIN:  Are you having pain? No  SOZO SCREENING: Patient was assessed today using the SOZO machine to determine the lymphedema index score. This was compared to her baseline score. It was determined that she is within the recommended range when compared to her baseline and no further action is needed at this time. She will continue SOZO screenings. These are  done every 3 months for 2 years post operatively followed by every 6 months for 2 years, and then annually.   L-DEX FLOWSHEETS - 12/29/21 0800       L-DEX LYMPHEDEMA SCREENING   Measurement Type Unilateral    L-DEX MEASUREMENT EXTREMITY Upper Extremity    POSITION  Standing    DOMINANT SIDE Left    At Risk Side Right    BASELINE SCORE (UNILATERAL) 4.2    L-DEX SCORE (UNILATERAL) -2.1    VALUE CHANGE (UNILAT) -6.3             L-DEX FLOWSHEETS - 12/29/21 0800       L-DEX LYMPHEDEMA SCREENING   Measurement Type Unilateral    L-DEX MEASUREMENT EXTREMITY Upper Extremity    POSITION  Standing    DOMINANT SIDE Left    At Risk Side Right    BASELINE SCORE (UNILATERAL) 4.2    L-DEX SCORE (UNILATERAL) -2.1    VALUE CHANGE (UNILAT) -6.3             L-DEX FLOWSHEETS - 12/29/21 0800       L-DEX LYMPHEDEMA SCREENING   Measurement Type Unilateral    L-DEX MEASUREMENT EXTREMITY Upper Extremity    POSITION  Standing    DOMINANT SIDE Left    At Risk Side Right    BASELINE SCORE (UNILATERAL) 4.2    L-DEX SCORE (UNILATERAL) -2.1    VALUE CHANGE (UNILAT) -6.3             Otelia Limes, PTA 12/29/2021, 8:19 AM

## 2022-01-06 NOTE — Progress Notes (Signed)
Patient Care Team: [name], MD as PCP - General (Internal Medicine) Coralie Keens, MD as Consulting Physician (General Surgery) Nicholas Lose, MD as Consulting Physician (Hematology and Oncology) [name], MD as Consulting Physician (Radiation Oncology) Gerald Dexter, MD as Referring Physician (Obstetrics and Gynecology)  DIAGNOSIS:  Encounter Diagnosis  Name Primary?   Malignant neoplasm of upper-outer quadrant of right breast in female, estrogen receptor negative ([place])     SUMMARY OF ONCOLOGIC HISTORY: Oncology History  Malignant neoplasm of upper-outer quadrant of right breast in female, estrogen receptor negative ([place])  03/28/2020 Initial Diagnosis   Patient palpated a right breast mass x2 wks. Mammogram and US showed a 2.0cm mass at the 10 o'clock position in the right breast and two enlarged right axillary lymph nodes, 2.0cm and 1.7cm. 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   04/16/2020 - 07/30/2020 Chemotherapy   TCHP x6 cycles, pathologic complete response, Herceptin Perjeta maintenance    04/18/2020 Genetic Testing   Negative genetic testing: no pathogenic variants detected in [facility] Multi-Cancer Panel.  Variants of uncertain significance detected in CDH1 at c.1289T>G (p.Val430Gly) and EGFR at c.2873G>A (p.Arg958His).  The report date is April 18, 2020.   The Multi-Cancer Panel offered by [facility] includes sequencing and/or deletion duplication testing of the following 85 genes: AIP, ALK, APC, ATM, AXIN2,BAP1,  BARD1, BLM, BMPR1A, BRCA1, BRCA2, BRIP1, CASR, CDC73, CDH1, CDK4, CDKN1B, CDKN1C, CDKN2A (p14ARF), CDKN2A (p16INK4a), CEBPA, CHEK2, CTNNA1, DICER1, DIS3L2, EGFR, EPCAM (Deletion/duplication testing only), FH, FLCN, GATA2, GPC3, GREM1 (Promoter region deletion/duplication testing only), HOXB13 (c.251G>A, p.Gly84Glu), HRAS, KIT, MAX, MEN1, MET, MITF (c.952G>A, p.Glu318Lys variant  only), MLH1, MSH2, MSH3, MSH6, MUTYH, NBN, NF1, NF2, NTHL1, PALB2, PDGFRA, PHOX2B, PMS2, POLD1, POLE, POT1, PRKAR1A, PTCH1, PTEN, RAD50, RAD51C, RAD51D, RB1, RECQL4, RET, RNF43, RUNX1, SDHAF2, SDHA (sequence changes only), SDHB, SDHC, SDHD, SMAD4, SMARCA4, SMARCB1, SMARCE1, STK11, SUFU, TERC, TERT, TMEM127, TP53, TSC1, TSC2, VHL, WRN and WT1.    08/20/2020 Surgery   Right lumpectomy [name]): no residual carcinoma, 7 right axillary lymph nodes negative for carcinoma.   09/10/2020 - 04/15/2021 Chemotherapy   Patient is on Treatment Plan : BREAST Trastuzumab + Pertuzumab q21d      09/26/2020 - 11/12/2020 Radiation Therapy   Adjuvant radiation   01/21/2021 -  Anti-estrogen oral therapy   Letrozole     CHIEF COMPLIANT: Follow-up on Letrozole  INTERVAL HISTORY: Shannon Kane is a 51 y.o with the above mention. She presents to the clinic today for a follow-up. She states that she has been doing good. Her Cholesterol has been high from taking the letrozole. She complains of joint pain in fingers. When she press she has pain. She states that the fingers hurt all day. She notice a bone spur on her knee. Her toe nail did grow back.   ALLERGIES:  is allergic to amoxicillin, ciprofloxacin, and penicillins.  MEDICATIONS:  Current Outpatient Medications  Medication Sig Dispense Refill   amLODipine (NORVASC) 2.5 MG tablet Take 1 tablet (2.5 mg total) by mouth daily. 90 tablet 1   Cholecalciferol (VITAMIN D-3 PO) Take 5,000 Units by mouth daily.     letrozole (FEMARA) 2.5 MG tablet Take 1 tablet (2.5 mg total) by mouth daily. 90 tablet 0   MAGNESIUM CITRATE PO Take 500 mcg by mouth at bedtime as needed (sleep). Only when having trouble sleeping     SYNTHROID 75 MCG tablet TAKE 1 TABLET DAILY BEFORE BREAKFAST 90 tablet  3   valsartan (DIOVAN) 160 MG tablet Take 1 tablet (160 mg total) by mouth daily. 90 tablet 2   vitamin B-12 (CYANOCOBALAMIN) 1000 MCG tablet Take 1,000 mcg by mouth daily.     No current  facility-administered medications for this visit.    PHYSICAL EXAMINATION: ECOG PERFORMANCE STATUS: 1 - Symptomatic but completely ambulatory  Vitals:   01/13/22 1030  BP: (!) 152/107  Pulse: 90  Resp: 18  Temp: 97.9 F (36.6 C)  SpO2: 99%   Filed Weights   01/13/22 1030  Weight: 200 lb 6.4 oz (90.9 kg)    BREAST: No palpable masses or nodules in either right or left breasts. No palpable axillary supraclavicular or infraclavicular adenopathy no breast tenderness or nipple discharge. (exam performed in the presence of a chaperone)  LABORATORY DATA:  I have reviewed the data as listed    Latest Ref Rng & Units 07/15/2021    9:40 AM 04/22/2021   10:57 AM 04/15/2021    9:36 AM  CMP  Glucose 70 - 99 mg/dL 101  85  96   BUN 6 - 24 mg/dL 14  10  11    Creatinine 0.57 - 1.00 mg/dL 0.88  0.85  0.77   Sodium 134 - 144 mmol/L 145  142  140   Potassium 3.5 - 5.2 mmol/L 4.6  4.0  3.2   Chloride 96 - 106 mmol/L 106  104  107   CO2 20 - 29 mmol/L 25  26  28    Calcium 8.7 - 10.2 mg/dL 10.0  9.4  8.9   Total Protein 6.0 - 8.5 g/dL 7.7   7.6   Total Bilirubin 0.0 - 1.2 mg/dL 0.4   0.6   Alkaline Phos 44 - 121 IU/L 103   91   AST 0 - 40 IU/L 22   21   ALT 0 - 32 IU/L 16   20     Lab Results  Component Value Date   WBC 5.0 07/15/2021   HGB 14.4 07/15/2021   HCT 42.2 07/15/2021   MCV 93 07/15/2021   PLT 228 07/15/2021   NEUTROABS 3.6 04/15/2021    ASSESSMENT & PLAN:  Malignant neoplasm of upper-outer quadrant of right breast in female, estrogen receptor negative ([place]) 03/28/2020:Patient palpated a right breast mass x2 wks. Mammogram and US showed a 2.0cm mass at the 10 o'clock position in the right breast and two enlarged right axillary lymph nodes, 2.0cm and 1.7cm. 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 Satellite lesion biopsy: DCIS ER 0%, PR 0%   Treatment plan: 1. Neoadjuvant chemotherapy with TCH Perjeta 6  cycles followed by Herceptin Perjeta maintenance for 1 year 2. 08/20/2020:Right lumpectomy [name]): no residual carcinoma, 7 right axillary lymph nodes negative for carcinoma. 3. Adjuvant radiation: 09/26/2020-11/12/2020 4.  Antiestrogen therapy with letrozole (ER 10% weakly positive) ----------------------------------------------------------------------------------------------------------------------------------------- Current treatment: Completed 6 cycles of TCH Perjeta, continue with Herceptin Perjeta maintenance   Echocardiogram 09/17/2020: EF 60 to 65% letrozole toxicities: She takes 1 tablet 2 times a week. 1.  Joint stiffness and achiness especially in the fingers 2. increased cholesterol: Closely being monitored.      Breast cancer surveillance: 1.  Breast exam 01/13/2022: Benign 2. Mammogram scheduled for 03/02/2022  Her daughter received full scholarship to [employer].  Return to clinic in 1 year for follow-up    No orders of the defined types were placed in  this encounter.  The patient has a good understanding of the overall plan. she agrees with it. she will call with any problems that may develop before the next visit here. Total time spent: 30 mins including face to face time and time spent for planning, charting and co-ordination of care   Harriette Ohara, MD 01/13/22    I Gardiner Coins am scribing for Dr. Lindi Adie  I have reviewed the above documentation for accuracy and completeness, and I agree with the above.

## 2022-01-12 ENCOUNTER — Encounter: Payer: Self-pay | Admitting: Internal Medicine

## 2022-01-12 ENCOUNTER — Ambulatory Visit: Admitting: Internal Medicine

## 2022-01-12 VITALS — BP 124/82 | HR 84 | Temp 98.1°F | Ht 68.0 in | Wt 199.8 lb

## 2022-01-12 DIAGNOSIS — R7309 Other abnormal glucose: Secondary | ICD-10-CM

## 2022-01-12 DIAGNOSIS — M25562 Pain in left knee: Secondary | ICD-10-CM | POA: Diagnosis not present

## 2022-01-12 DIAGNOSIS — L659 Nonscarring hair loss, unspecified: Secondary | ICD-10-CM | POA: Diagnosis not present

## 2022-01-12 DIAGNOSIS — E6609 Other obesity due to excess calories: Secondary | ICD-10-CM

## 2022-01-12 DIAGNOSIS — Z6830 Body mass index (BMI) 30.0-30.9, adult: Secondary | ICD-10-CM

## 2022-01-12 DIAGNOSIS — E039 Hypothyroidism, unspecified: Secondary | ICD-10-CM

## 2022-01-12 DIAGNOSIS — I10 Essential (primary) hypertension: Secondary | ICD-10-CM | POA: Diagnosis not present

## 2022-01-12 NOTE — Patient Instructions (Signed)
Hypertension, Adult ?Hypertension is another name for high blood pressure. High blood pressure forces your heart to work harder to pump blood. This can cause problems over time. ?There are two numbers in a blood pressure reading. There is a top number (systolic) over a bottom number (diastolic). It is best to have a blood pressure that is below 120/80. ?What are the causes? ?The cause of this condition is not known. Some other conditions can lead to high blood pressure. ?What increases the risk? ?Some lifestyle factors can make you more likely to develop high blood pressure: ?Smoking. ?Not getting enough exercise or physical activity. ?Being overweight. ?Having too much fat, sugar, calories, or salt (sodium) in your diet. ?Drinking too much alcohol. ?Other risk factors include: ?Having any of these conditions: ?Heart disease. ?Diabetes. ?High cholesterol. ?Kidney disease. ?Obstructive sleep apnea. ?Having a family history of high blood pressure and high cholesterol. ?Age. The risk increases with age. ?Stress. ?What are the signs or symptoms? ?High blood pressure may not cause symptoms. Very high blood pressure (hypertensive crisis) may cause: ?Headache. ?Fast or uneven heartbeats (palpitations). ?Shortness of breath. ?Nosebleed. ?Vomiting or feeling like you may vomit (nauseous). ?Changes in how you see. ?Very bad chest pain. ?Feeling dizzy. ?Seizures. ?How is this treated? ?This condition is treated by making healthy lifestyle changes, such as: ?Eating healthy foods. ?Exercising more. ?Drinking less alcohol. ?Your doctor may prescribe medicine if lifestyle changes do not help enough and if: ?Your top number is above 130. ?Your bottom number is above 80. ?Your personal target blood pressure may vary. ?Follow these instructions at home: ?Eating and drinking ? ?If told, follow the DASH eating plan. To follow this plan: ?Fill one half of your plate at each meal with fruits and vegetables. ?Fill one fourth of your plate  at each meal with whole grains. Whole grains include whole-wheat pasta, brown rice, and whole-grain bread. ?Eat or drink low-fat dairy products, such as skim milk or low-fat yogurt. ?Fill one fourth of your plate at each meal with low-fat (lean) proteins. Low-fat proteins include fish, chicken without skin, eggs, beans, and tofu. ?Avoid fatty meat, cured and processed meat, or chicken with skin. ?Avoid pre-made or processed food. ?Limit the amount of salt in your diet to less than 1,500 mg each day. ?Do not drink alcohol if: ?Your doctor tells you not to drink. ?You are pregnant, may be pregnant, or are planning to become pregnant. ?If you drink alcohol: ?Limit how much you have to: ?0-1 drink a day for women. ?0-2 drinks a day for men. ?Know how much alcohol is in your drink. In the [place] one drink equals one 12 oz bottle of beer (355 mL), one 5 oz glass of wine (148 mL), or one 1? oz glass of hard liquor (44 mL). ?Lifestyle ? ?Work with your doctor to stay at a healthy weight or to lose weight. Ask your doctor what the best weight is for you. ?Get at least 30 minutes of exercise that causes your heart to beat faster (aerobic exercise) most days of the week. This may include walking, swimming, or biking. ?Get at least 30 minutes of exercise that strengthens your muscles (resistance exercise) at least 3 days a week. This may include lifting weights or doing Pilates. ?Do not smoke or use any products that contain nicotine or tobacco. If you need help quitting, ask your doctor. ?Check your blood pressure at home as told by your doctor. ?Keep all follow-up visits. ?Medicines ?Take over-the-counter and prescription medicines   only as told by your doctor. Follow directions carefully. ?Do not skip doses of blood pressure medicine. The medicine does not work as well if you skip doses. Skipping doses also puts you at risk for problems. ?Ask your doctor about side effects or reactions to medicines that you should watch  for. ?Contact a doctor if: ?You think you are having a reaction to the medicine you are taking. ?You have headaches that keep coming back. ?You feel dizzy. ?You have swelling in your ankles. ?You have trouble with your vision. ?Get help right away if: ?You get a very bad headache. ?You start to feel mixed up (confused). ?You feel weak or numb. ?You feel faint. ?You have very bad pain in your: ?Chest. ?Belly (abdomen). ?You vomit more than once. ?You have trouble breathing. ?These symptoms may be an emergency. Get help right away. Call 911. ?Do not wait to see if the symptoms will go away. ?Do not drive yourself to the hospital. ?Summary ?Hypertension is another name for high blood pressure. ?High blood pressure forces your heart to work harder to pump blood. ?For most people, a normal blood pressure is less than 120/80. ?Making healthy choices can help lower blood pressure. If your blood pressure does not get lower with healthy choices, you may need to take medicine. ?This information is not intended to replace advice given to you by your health care provider. Make sure you discuss any questions you have with your health care provider. ?Document Revised: 02/20/2021 Document Reviewed: 02/20/2021 ?Elsevier Patient Education ? 2023 Elsevier Inc. ? ?

## 2022-01-12 NOTE — Progress Notes (Signed)
I,Shannon Kane,acting as a scribe for Shannon N Sanders, MD.,have documented all relevant documentation on the behalf of Shannon N Sanders, MD,as directed by  Shannon N Sanders, MD while in the presence of Shannon N Sanders, MD.    Subjective:     Patient ID: Shannon Kane , female    DOB: 10/23/1970 , 51 y.o.   MRN: [id number]   Chief Complaint  Patient presents with  . Hypertension    HPI  The patient is here today for a follow-up for her blood pressure.  She reports compliance with meds. She denies having headaches, chest pain and shortness of breath.   She would like to talk about major hair loss, in the middle of her head. She wants to know what she can do to improve issue.  Patient adds, when she lies down at night, she feels a lot of fluid in her chest, which produces a cough or she sometimes gags.   Hypertension This is a chronic problem. The current episode started more than 1 year ago. The problem has been gradually improving since onset. The problem is controlled. Pertinent negatives include no blurred vision, chest pain, orthopnea, palpitations or shortness of breath. Past treatments include ACE inhibitors. The current treatment provides moderate improvement. Identifiable causes of hypertension include a thyroid problem.  Thyroid Problem Presents for follow-up visit. Patient reports no cold intolerance, leg swelling, palpitations or weight gain. The symptoms have been stable.     Past Medical History:  Diagnosis Date  . Abnormal glucose   . 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 Family history of breast cancer 04/03/2020  . Family history of ovarian cancer 04/03/2020  . History of radiation therapy    Right breast- 09/25/20-11/12/20- Dr. James Kinard  . Hypertension   . Hyperthyroidism    in the past  . Hypothyroidism   . Iron deficiency anemia   . Palpitation   . Personal history of chemotherapy   . Spontaneous ecchymoses   . Tachycardia   . Vitamin D  deficiency      Family History  Problem Relation Age of Onset  . Diabetes Mother   . Hypertension Mother   . Leukemia Father        dx > 50  . Ovarian cancer Maternal Grandmother 43  . Bone cancer Paternal Grandfather        dx unknown age  . Cancer Maternal Uncle        Mouth and throad; dx > 50  . Breast cancer Other        MGM's niece, dx unknown age  . Cancer Maternal Uncle        unknown type; dx > 50     Current Outpatient Medications:  .  amLODipine (NORVASC) 2.5 MG tablet, Take 1 tablet (2.5 mg total) by mouth daily., Disp: 90 tablet, Rfl: 1 .  Cholecalciferol (VITAMIN D-3 PO), Take 5,000 Units by mouth daily., Disp: , Rfl:  .  letrozole (FEMARA) 2.5 MG tablet, Take 1 tablet (2.5 mg total) by mouth daily., Disp: 90 tablet, Rfl: 0 .  MAGNESIUM CITRATE PO, Take 500 mcg by mouth at bedtime as needed (sleep). Only when having trouble sleeping, Disp: , Rfl:  .  SYNTHROID 75 MCG tablet, TAKE 1 TABLET DAILY BEFORE BREAKFAST, Disp: 90 tablet, Rfl: 3 .  valsartan (DIOVAN) 160 MG tablet, Take 1 tablet (160 mg total) by mouth daily., Disp: 90 tablet, Rfl: 2 .  vitamin B-12 (CYANOCOBALAMIN) 1000   MCG tablet, Take 1,000 mcg by mouth daily., Disp: , Rfl:    Allergies  Allergen Reactions  . Amoxicillin     Tolerates ANCEF  . Ciprofloxacin Itching  . Penicillins Hives    Tolerates ANCEF      Review of Systems  Constitutional: Negative.  Negative for weight gain.  Eyes: Negative.  Negative for blurred vision.  Respiratory: Negative.  Negative for shortness of breath.   Cardiovascular: Negative.  Negative for chest pain, palpitations and orthopnea.  Endocrine: Negative for cold intolerance.  Neurological: Negative.   Psychiatric/Behavioral: Negative.       Today's Vitals   01/12/22 0833  BP: 124/82  Pulse: 84  Temp: 98.1 F (36.7 C)  SpO2: 98%  Weight: 199 lb 12.8 oz (90.6 kg)  Height: 5' 8" (1.727 m)  PainSc: 0-No pain   Body mass index is 30.38 kg/m.  Wt Readings  from Last 3 Encounters:  01/12/22 199 lb 12.8 oz (90.6 kg)  12/29/21 199 lb 8 oz (90.5 kg)  09/08/21 194 lb 4 oz (88.1 kg)    Objective:  Physical Exam Vitals and nursing note reviewed.  Constitutional:      Appearance: Normal appearance.  HENT:     Head: Normocephalic and atraumatic.  Cardiovascular:     Rate and Rhythm: Normal rate and regular rhythm.     Heart sounds: Normal heart sounds.  Pulmonary:     Effort: Pulmonary effort is normal.     Breath sounds: Normal breath sounds.  Musculoskeletal:        General: No tenderness.     Comments: Bony abnormality left patellar area  Skin:    General: Skin is warm.     Comments: Hair thinning/loss at crown, no scalp erythema  Neurological:     General: No focal deficit present.     Mental Status: She is alert.  Psychiatric:        Mood and Affect: Mood normal.        Behavior: Behavior normal.     Assessment And Plan:     1. Essential hypertension, benign - CMP14+EGFR - CBC no Diff  2. Primary hypothyroidism - T4, free  3. Hair loss - Iron, TIBC and Ferritin Panel - TSH - ANA, IFA (with reflex)  4. Class 1 obesity due to excess calories without serious comorbidity with body mass index (BMI) of 30.0 to 30.9 in adult  5. Immunization due - Zoster Recombinant (Shingrix ) She is encouraged to strive for BMI less than 30 to decrease cardiac risk. Advised to aim for at least 150 minutes of exercise per week.    Patient was given opportunity to ask questions. Patient verbalized understanding of the plan and was able to repeat key elements of the plan. All questions were answered to their satisfaction.  [name], MD   I, [name], MD, have reviewed all documentation for this visit. The documentation on 01/12/22 for the exam, diagnosis, procedures, and orders are all accurate and complete.   IF YOU HAVE BEEN REFERRED TO A SPECIALIST, IT MAY TAKE 1-2 WEEKS TO SCHEDULE/PROCESS THE REFERRAL. IF YOU HAVE NOT  HEARD FROM US/SPECIALIST IN TWO WEEKS, PLEASE GIVE Shannon Kane A CALL AT [phone].   THE PATIENT IS ENCOURAGED TO PRACTICE SOCIAL DISTANCING DUE TO THE COVID-19 PANDEMIC.

## 2022-01-13 ENCOUNTER — Other Ambulatory Visit: Payer: Self-pay

## 2022-01-13 ENCOUNTER — Inpatient Hospital Stay: Attending: Hematology and Oncology | Admitting: Hematology and Oncology

## 2022-01-13 DIAGNOSIS — M25649 Stiffness of unspecified hand, not elsewhere classified: Secondary | ICD-10-CM | POA: Insufficient documentation

## 2022-01-13 DIAGNOSIS — Z79811 Long term (current) use of aromatase inhibitors: Secondary | ICD-10-CM | POA: Insufficient documentation

## 2022-01-13 DIAGNOSIS — Z853 Personal history of malignant neoplasm of breast: Secondary | ICD-10-CM | POA: Insufficient documentation

## 2022-01-13 DIAGNOSIS — C50411 Malignant neoplasm of upper-outer quadrant of right female breast: Secondary | ICD-10-CM | POA: Diagnosis not present

## 2022-01-13 DIAGNOSIS — Z171 Estrogen receptor negative status [ER-]: Secondary | ICD-10-CM

## 2022-01-13 LAB — T4, FREE: Free T4: 1.39 ng/dL (ref 0.82–1.77)

## 2022-01-13 LAB — CMP14+EGFR
ALT: 15 IU/L (ref 0–32)
AST: 21 IU/L (ref 0–40)
Albumin/Globulin Ratio: 1.6 (ref 1.2–2.2)
Albumin: 4.5 g/dL (ref 3.8–4.9)
Alkaline Phosphatase: 137 IU/L — ABNORMAL HIGH (ref 44–121)
BUN/Creatinine Ratio: 14 (ref 9–23)
BUN: 11 mg/dL (ref 6–24)
Bilirubin Total: 0.4 mg/dL (ref 0.0–1.2)
CO2: 22 mmol/L (ref 20–29)
Calcium: 9.9 mg/dL (ref 8.7–10.2)
Chloride: 105 mmol/L (ref 96–106)
Creatinine, Ser: 0.77 mg/dL (ref 0.57–1.00)
Globulin, Total: 2.8 g/dL (ref 1.5–4.5)
Glucose: 95 mg/dL (ref 70–99)
Potassium: 4.2 mmol/L (ref 3.5–5.2)
Sodium: 143 mmol/L (ref 134–144)
Total Protein: 7.3 g/dL (ref 6.0–8.5)
eGFR: 93 mL/min/{1.73_m2} (ref >59)

## 2022-01-13 LAB — IRON,TIBC AND FERRITIN PANEL
Ferritin: 68 ng/mL (ref 15–150)
Iron Saturation: 21 % (ref 15–55)
Iron: 75 ug/dL (ref 27–159)
Total Iron Binding Capacity: 359 ug/dL (ref 250–450)
UIBC: 284 ug/dL (ref 131–425)

## 2022-01-13 LAB — TSH: TSH: 2.36 u[IU]/mL (ref 0.450–4.500)

## 2022-01-13 LAB — CBC
Hematocrit: 40.4 % (ref 34.0–46.6)
Hemoglobin: 13.5 g/dL (ref 11.1–15.9)
MCH: 30.5 pg (ref 26.6–33.0)
MCHC: 33.4 g/dL (ref 31.5–35.7)
MCV: 91 fL (ref 79–97)
Platelets: 236 10*3/uL (ref 150–450)
RBC: 4.42 x10E6/uL (ref 3.77–5.28)
RDW: 12.9 % (ref 11.7–15.4)
WBC: 4 10*3/uL (ref 3.4–10.8)

## 2022-01-13 LAB — HEMOGLOBIN A1C
Est. average glucose Bld gHb Est-mCnc: 140 mg/dL
Hgb A1c MFr Bld: 6.5 % — ABNORMAL HIGH (ref 4.8–5.6)

## 2022-01-13 LAB — ANTINUCLEAR ANTIBODIES, IFA: ANA Titer 1: NEGATIVE

## 2022-01-13 MED ORDER — LETROZOLE 2.5 MG PO TABS: 2.5000 mg | ORAL_TABLET | Freq: Every day | ORAL | 0 refills | Status: DC

## 2022-01-13 MED ORDER — VALSARTAN 160 MG PO TABS: 160.0000 mg | ORAL_TABLET | Freq: Every day | ORAL | 2 refills | Status: DC

## 2022-01-13 NOTE — Assessment & Plan Note (Addendum)
03/28/2020:Patient palpated a right breast mass x2 wks. Mammogram and US showed a 2.0cm mass at the 10 o'clock position in the right breast and two enlarged right axillary lymph nodes, 2.0cm and 1.7cm. 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 Satellite lesion biopsy: DCIS ER 0%, PR 0%  Treatment plan: 1. Neoadjuvant chemotherapy with TCH Perjeta 6 cycles followed by HerceptinPerjeta maintenance for 1 year 2.08/20/2020:Right lumpectomy Shannon Kane): no residual carcinoma, 7 right axillary lymph nodes negative for carcinoma. 3. Adjuvant radiation:09/26/2020-11/12/2020 4.  Antiestrogen therapy with letrozole (ER 10% weakly positive) ----------------------------------------------------------------------------------------------------------------------------------------- Current treatment:Completed 6 cycles of TCH Perjeta, continue with Herceptin Perjeta maintenance Echocardiogram5/07/2020: EF 60 to 65% letrozole toxicities: She takes 1 tablet 2 times a week. 1.  Joint stiffness and achiness especially in the fingers 2. increased cholesterol: Closely being monitored.     Breast cancer surveillance: 1.  Breast exam 01/13/2022: Benign 2. Mammogram scheduled for 03/02/2022  Her daughter received full scholarship to [place].  Return to clinic in 1 year for follow-up

## 2022-01-14 ENCOUNTER — Telehealth: Payer: Self-pay | Admitting: Hematology and Oncology

## 2022-01-14 NOTE — Telephone Encounter (Signed)
Scheduled appointment per 8/29 los. Patient is aware.

## 2022-01-20 ENCOUNTER — Other Ambulatory Visit: Payer: Self-pay | Admitting: *Deleted

## 2022-01-20 DIAGNOSIS — Z171 Estrogen receptor negative status [ER-]: Secondary | ICD-10-CM

## 2022-01-23 ENCOUNTER — Telehealth: Payer: Self-pay | Admitting: Hematology and Oncology

## 2022-01-23 NOTE — Telephone Encounter (Signed)
Scheduled appointment per 9/5 staff message. Patient is aware.

## 2022-02-05 ENCOUNTER — Ambulatory Visit (INDEPENDENT_AMBULATORY_CARE_PROVIDER_SITE_OTHER)

## 2022-02-05 VITALS — BP 120/64 | HR 76 | Temp 98.3°F

## 2022-02-05 DIAGNOSIS — Z23 Encounter for immunization: Secondary | ICD-10-CM

## 2022-02-05 NOTE — Patient Instructions (Signed)
Zoster Vaccine, Recombinant injection What is this medication? ZOSTER VACCINE (ZOS ter vak SEEN) is a vaccine used to reduce the risk of getting shingles. This vaccine is not used to treat shingles or nerve pain from shingles. This medicine may be used for other purposes; ask your health care provider or pharmacist if you have questions. COMMON BRAND NAME(S): [facility] What should I tell my care team before I take this medication? They need to know if you have any of these conditions: cancer immune system problems an unusual or allergic reaction to Zoster vaccine, other medications, foods, dyes, or preservatives pregnant or trying to get pregnant breast-feeding How should I use this medication? This vaccine is injected into a muscle. It is given by a health care provider. A copy of Vaccine Information Statements will be given before each vaccination. Be sure to read this information carefully each time. This sheet may change often. Talk to your health care provider about the use of this vaccine in children. This vaccine is not approved for use in children. Overdosage: If you think you have taken too much of this medicine contact a poison control center or emergency room at once. NOTE: This medicine is only for you. Do not share this medicine with others. What if I miss a dose? Keep appointments for follow-up (booster) doses. It is important not to miss your dose. Call your health care provider if you are unable to keep an appointment. What may interact with this medication? medicines that suppress your immune system medicines to treat cancer steroid medicines like prednisone or cortisone This list may not describe all possible interactions. Give your health care provider a list of all the medicines, herbs, non-prescription drugs, or dietary supplements you use. Also tell them if you smoke, drink alcohol, or use illegal drugs. Some items may interact with your medicine. What should I watch for  while using this medication? Visit your health care provider regularly. This vaccine, like all vaccines, may not fully protect everyone. What side effects may I notice from receiving this medication? Side effects that you should report to your doctor or health care professional as soon as possible: allergic reactions (skin rash, itching or hives; swelling of the face, lips, or tongue) trouble breathing Side effects that usually do not require medical attention (report these to your doctor or health care professional if they continue or are bothersome): chills headache fever nausea pain, redness, or irritation at site where injected tiredness vomiting This list may not describe all possible side effects. Call your doctor for medical advice about side effects. You may report side effects to FDA at 1-800-FDA-1088. Where should I keep my medication? This vaccine is only given by a health care provider. It will not be stored at home. NOTE: This sheet is a summary. It may not cover all possible information. If you have questions about this medicine, talk to your doctor, pharmacist, or health care provider.  2023 Elsevier/Gold Standard (2021-04-04 00:00:00)

## 2022-02-10 ENCOUNTER — Ambulatory Visit

## 2022-02-11 ENCOUNTER — Other Ambulatory Visit: Payer: Self-pay | Admitting: *Deleted

## 2022-02-11 MED ORDER — LETROZOLE 2.5 MG PO TABS: 2.5000 mg | ORAL_TABLET | Freq: Every day | ORAL | 3 refills | Status: DC

## 2022-02-23 ENCOUNTER — Other Ambulatory Visit: Payer: Self-pay | Admitting: Internal Medicine

## 2022-02-23 DIAGNOSIS — I10 Essential (primary) hypertension: Secondary | ICD-10-CM

## 2022-03-02 ENCOUNTER — Ambulatory Visit
Admission: RE | Admit: 2022-03-02 | Discharge: 2022-03-02 | Disposition: A | Discharge status: Home / Self Care | Source: Ambulatory Visit | Attending: Adult Health | Admitting: Adult Health

## 2022-03-02 DIAGNOSIS — Z171 Estrogen receptor negative status [ER-]: Secondary | ICD-10-CM

## 2022-03-02 HISTORY — DX: Personal history of irradiation: Z92.3

## 2022-03-30 ENCOUNTER — Ambulatory Visit: Attending: Hematology and Oncology

## 2022-03-30 VITALS — Wt 201.0 lb

## 2022-03-30 DIAGNOSIS — Z483 Aftercare following surgery for neoplasm: Secondary | ICD-10-CM | POA: Insufficient documentation

## 2022-03-30 NOTE — Therapy (Signed)
OUTPATIENT PHYSICAL THERAPY SOZO SCREENING NOTE   Patient Name: Shannon Kane MRN: [id number] DOB:[birth year], 51 y.o., female Today's Date: 03/30/2022  PCP: [name], MD REFERRING PROVIDER: [name], MD   PT End of Session - 03/30/22 0836     Visit Number 9   # unchanged due to screen only   PT Start Time 0835    PT Stop Time 0840    PT Time Calculation (min) 5 min    Activity Tolerance Patient tolerated treatment well    Behavior During Therapy [facility] for tasks assessed/performed             Past Medical History:  Diagnosis Date   Abnormal glucose    Breast cancer ([place]) 03/2020   right breast IDC   Family history of breast cancer 04/03/2020   Family history of ovarian cancer 04/03/2020   History of radiation therapy    Right breast- 09/25/20-11/12/20- Dr. [name]   Hypertension    Hyperthyroidism    in the past   Hypothyroidism    Iron deficiency anemia    Palpitation    Personal history of chemotherapy    Personal history of radiation therapy    Spontaneous ecchymoses    Tachycardia    Vitamin D deficiency    Past Surgical History:  Procedure Laterality Date   BREAST BIOPSY     BREAST EXCISIONAL BIOPSY     BREAST LUMPECTOMY Right 08/20/2020   w/ rad w/ chemo   BREAST LUMPECTOMY WITH RADIOACTIVE SEED LOCALIZATION Right 08/20/2020   Procedure: RADIOACTIVE SEED GUIDED RIGHT BREAST LUMPECTOMY;  Surgeon: Coralie Keens, MD;  Location: [place];  Service: General;  Laterality: Right;   CHOLECYSTECTOMY     COLONOSCOPY  05/18/2018   KNEE SURGERY     PORT-A-CATH REMOVAL N/A 04/30/2021   Procedure: REMOVAL PORT-A-CATH;  Surgeon: Coralie Keens, MD;  Location: [place];  Service: General;  Laterality: N/A;   PORTACATH PLACEMENT Left 04/15/2020   Procedure: INSERTION PORT-A-CATH;  Surgeon: Coralie Keens, MD;  Location: [place];  Service: General;  Laterality: Left;   RADIOACTIVE SEED GUIDED AXILLARY SENTINEL  LYMPH NODE Right 08/20/2020   Procedure: RADIOACTIVE SEED TARGETED RIGHT AXILLARY LYMPH NODE DISSECTION;  Surgeon: Coralie Keens, MD;  Location: [place];  Service: General;  Laterality: Right;   TONSILLECTOMY     Patient Active Problem List   Diagnosis Date Noted   Constipation 07/15/2021   Chronic idiopathic constipation 07/15/2021   Dysphagia 07/15/2021   Gastroesophageal reflux disease 07/15/2021   Uterine leiomyoma 04/19/2020   Genetic testing 04/19/2020   Family history of ovarian cancer 04/03/2020   Family history of leukemia 04/03/2020   Family history of breast cancer 04/03/2020   Malignant neoplasm of upper-outer quadrant of right breast in female, estrogen receptor negative ([place]) 03/28/2020   Overweight (BMI 25.0-29.9) 12/05/2019   Essential hypertension, benign 05/29/2018   Iritis of left eye 05/29/2018   Iron deficiency anemia due to chronic blood loss 05/29/2018   Primary hypothyroidism 05/29/2018    REFERRING DIAG: right breast cancer at risk for lymphedema  THERAPY DIAG: Aftercare following surgery for neoplasm  PERTINENT HISTORY: Patient was diagnosed on 03/14/2020 with right grade 2 invasive ductal carcinoma breast cancer.  She underwent neoadjuvant chemo followed by a right lumpectomy and targeted node dissection (7 negative lymph nodes) on 08/20/2020. It is ER/PR negative and HER2 positive with a Ki67 of 30%. She has a positive axillary lymph node. Still has port on the left side  for infusion for Herception, Projeta. Port removed on 04/30/2021   PRECAUTIONS: right UE Lymphedema risk, None  SUBJECTIVE: Pt returns for her 3 month L-Dex screen.   PAIN:  Are you having pain? No  SOZO SCREENING: Patient was assessed today using the SOZO machine to determine the lymphedema index score. This was compared to her baseline score. It was determined that she is within the recommended range when compared to her baseline and no further action is needed at this time. She will  continue SOZO screenings. These are done every 3 months for 2 years post operatively followed by every 6 months for 2 years, and then annually.   L-DEX FLOWSHEETS - 03/30/22 0800       L-DEX LYMPHEDEMA SCREENING   Measurement Type Unilateral    L-DEX MEASUREMENT EXTREMITY Upper Extremity    POSITION  Standing    DOMINANT SIDE Left    At Risk Side Right    BASELINE SCORE (UNILATERAL) 4.2    L-DEX SCORE (UNILATERAL) -3.2    VALUE CHANGE (UNILAT) -7.4               Otelia Limes, PTA 03/30/2022, 8:38 AM

## 2022-04-01 ENCOUNTER — Encounter: Payer: Self-pay | Admitting: *Deleted

## 2022-04-01 NOTE — Progress Notes (Signed)
Pt seen by PCP Dr. [name] again on 01/12/22 and A1C was repeated and again was 6.5. Pt also being actively ollowed by oncology, PT and OT in addition to her PCP. No additional health equity f/u indicated for pt's healthcare access and f/u at this time

## 2022-04-20 ENCOUNTER — Other Ambulatory Visit: Payer: Self-pay

## 2022-04-20 ENCOUNTER — Inpatient Hospital Stay: Attending: Hematology and Oncology

## 2022-04-20 DIAGNOSIS — C50411 Malignant neoplasm of upper-outer quadrant of right female breast: Secondary | ICD-10-CM | POA: Insufficient documentation

## 2022-04-20 DIAGNOSIS — Z79811 Long term (current) use of aromatase inhibitors: Secondary | ICD-10-CM | POA: Insufficient documentation

## 2022-04-20 DIAGNOSIS — Z8041 Family history of malignant neoplasm of ovary: Secondary | ICD-10-CM | POA: Insufficient documentation

## 2022-04-20 DIAGNOSIS — Z171 Estrogen receptor negative status [ER-]: Secondary | ICD-10-CM | POA: Diagnosis not present

## 2022-04-20 DIAGNOSIS — Z803 Family history of malignant neoplasm of breast: Secondary | ICD-10-CM | POA: Insufficient documentation

## 2022-04-20 DIAGNOSIS — I10 Essential (primary) hypertension: Secondary | ICD-10-CM | POA: Insufficient documentation

## 2022-04-20 DIAGNOSIS — Z806 Family history of leukemia: Secondary | ICD-10-CM | POA: Insufficient documentation

## 2022-04-20 LAB — CMP (CANCER CENTER ONLY)
ALT: 18 U/L (ref 0–44)
AST: 20 U/L (ref 15–41)
Albumin: 4.1 g/dL (ref 3.5–5.0)
Alkaline Phosphatase: 94 U/L (ref 38–126)
Anion gap: 5 (ref 5–15)
BUN: 13 mg/dL (ref 6–20)
CO2: 30 mmol/L (ref 22–32)
Calcium: 9.6 mg/dL (ref 8.9–10.3)
Chloride: 106 mmol/L (ref 98–111)
Creatinine: 0.74 mg/dL (ref 0.44–1.00)
GFR, Estimated: >60 mL/min (ref >60)
Glucose, Bld: 110 mg/dL — ABNORMAL HIGH (ref 70–99)
Potassium: 3.9 mmol/L (ref 3.5–5.1)
Sodium: 141 mmol/L (ref 135–145)
Total Bilirubin: 0.4 mg/dL (ref 0.3–1.2)
Total Protein: 7.3 g/dL (ref 6.5–8.1)

## 2022-04-20 LAB — CBC WITH DIFFERENTIAL (CANCER CENTER ONLY)
Abs Immature Granulocytes: 0.01 10*3/uL (ref 0.00–0.07)
Basophils Absolute: 0 10*3/uL (ref 0.0–0.1)
Basophils Relative: 1 %
Eosinophils Absolute: 0.1 10*3/uL (ref 0.0–0.5)
Eosinophils Relative: 3 %
HCT: 38.6 % (ref 36.0–46.0)
Hemoglobin: 12.9 g/dL (ref 12.0–15.0)
Immature Granulocytes: 0 %
Lymphocytes Relative: 29 %
Lymphs Abs: 1.1 10*3/uL (ref 0.7–4.0)
MCH: 30.9 pg (ref 26.0–34.0)
MCHC: 33.4 g/dL (ref 30.0–36.0)
MCV: 92.6 fL (ref 80.0–100.0)
Monocytes Absolute: 0.5 10*3/uL (ref 0.1–1.0)
Monocytes Relative: 13 %
Neutro Abs: 2.2 10*3/uL (ref 1.7–7.7)
Neutrophils Relative %: 54 %
Platelet Count: 202 10*3/uL (ref 150–400)
RBC: 4.17 MIL/uL (ref 3.87–5.11)
RDW: 13.9 % (ref 11.5–15.5)
WBC Count: 4 10*3/uL (ref 4.0–10.5)
nRBC: 0 % (ref 0.0–0.2)

## 2022-04-20 LAB — LIPID PANEL
Cholesterol: 179 mg/dL (ref 0–200)
HDL: 42 mg/dL (ref >40)
LDL Cholesterol: 124 mg/dL — ABNORMAL HIGH (ref 0–99)
Total CHOL/HDL Ratio: 4.3 RATIO
Triglycerides: 66 mg/dL (ref <150)
VLDL: 13 mg/dL (ref 0–40)

## 2022-04-21 ENCOUNTER — Other Ambulatory Visit

## 2022-04-23 ENCOUNTER — Encounter: Payer: Self-pay | Admitting: Adult Health

## 2022-04-23 ENCOUNTER — Inpatient Hospital Stay (HOSPITAL_BASED_OUTPATIENT_CLINIC_OR_DEPARTMENT_OTHER): Admitting: Adult Health

## 2022-04-23 VITALS — BP 145/93 | HR 87 | Temp 97.9°F | Resp 16 | Ht 68.0 in | Wt 203.1 lb

## 2022-04-23 DIAGNOSIS — C50411 Malignant neoplasm of upper-outer quadrant of right female breast: Secondary | ICD-10-CM

## 2022-04-23 DIAGNOSIS — Z171 Estrogen receptor negative status [ER-]: Secondary | ICD-10-CM | POA: Diagnosis not present

## 2022-04-23 NOTE — Patient Instructions (Addendum)
Stop taking the Letrozole until 1/1, call our office on 1/2 and let us know if the aches are better.  If so, we will start Anastrozole.    Repeat labs in March when you see Dr. Baird Cancer.

## 2022-04-23 NOTE — Progress Notes (Signed)
[facility] Follow up:    Shannon Kane, Shannon Kane   DIAGNOSIS:  Cancer Staging  Malignant neoplasm of upper-outer quadrant of right breast in female, estrogen receptor negative ([place]) Staging form: Breast, AJCC 8th Edition - Clinical stage from 04/03/2020: Stage IIA (cT1c, cN1, cM0, G1, ER-, PR-, HER2+) - Unsigned Stage prefix: Initial diagnosis Histologic grading system: 3 grade system   SUMMARY OF ONCOLOGIC HISTORY: Oncology History  Malignant neoplasm of upper-outer quadrant of right breast in female, estrogen receptor negative ([place])  03/28/2020 Initial Diagnosis   Patient palpated a right breast mass x2 wks. Mammogram and US showed a 2.0cm mass at the 10 o'clock position in the right breast and two enlarged right axillary lymph nodes, 2.0cm and 1.7cm. 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   04/16/2020 - 07/30/2020 Chemotherapy   TCHP x6 cycles, pathologic complete response, Herceptin Perjeta maintenance    04/18/2020 Genetic Testing   Negative genetic testing: no pathogenic variants detected in [facility] Multi-Cancer Panel.  Variants of uncertain significance detected in CDH1 at c.1289T>G (p.Val430Gly) and EGFR at c.2873G>A (p.Arg958His).  The report date is April 18, 2020.   The Multi-Cancer Panel offered by [facility] includes sequencing and/or deletion duplication testing of the following 85 genes: AIP, ALK, APC, ATM, AXIN2,BAP1,  BARD1, BLM, BMPR1A, BRCA1, BRCA2, BRIP1, CASR, CDC73, CDH1, CDK4, CDKN1B, CDKN1C, CDKN2A (p14ARF), CDKN2A (p16INK4a), CEBPA, CHEK2, CTNNA1, DICER1, DIS3L2, EGFR, EPCAM (Deletion/duplication testing only), FH, FLCN, GATA2, GPC3, GREM1 (Promoter region deletion/duplication testing only), HOXB13 (c.251G>A, p.Gly84Glu), HRAS, KIT, MAX, MEN1, MET, MITF (c.952G>A, p.Glu318Lys variant only), MLH1, MSH2, MSH3, MSH6, MUTYH, NBN, NF1, NF2,  NTHL1, PALB2, PDGFRA, PHOX2B, PMS2, POLD1, POLE, POT1, PRKAR1A, PTCH1, PTEN, RAD50, RAD51C, RAD51D, RB1, RECQL4, RET, RNF43, RUNX1, SDHAF2, SDHA (sequence changes only), SDHB, SDHC, SDHD, SMAD4, SMARCA4, SMARCB1, SMARCE1, STK11, SUFU, TERC, TERT, TMEM127, TP53, TSC1, TSC2, VHL, WRN and WT1.    08/20/2020 Surgery   Right lumpectomy [name]): no residual carcinoma, 7 right axillary lymph nodes negative for carcinoma.   09/10/2020 - 04/15/2021 Chemotherapy   Patient is on Treatment Plan : BREAST Trastuzumab + Pertuzumab q21d      09/26/2020 - 11/12/2020 Radiation Therapy   Adjuvant radiation   01/21/2021 -  Anti-estrogen oral therapy   Letrozole     CURRENT THERAPY: Letrozole  INTERVAL HISTORY: Shannon Kane 51 y.o. female returns for f/u on letrozole.  She is doing moderately well.  Her cholesterol is slightly higher with her LDL from last time.  She still has arthralgias in her hands/fingers, with a trigger finger in her right first digit.  She is taking 1 tablet of Letrozole on Tuesday/Saturday and nothing the other days.    She denies any breast concerns today and her most recent mammogram occurred in October showing no evidence of malignancy and breast density category B.   Patient Active Problem List   Diagnosis Date Noted   Constipation 07/15/2021   Chronic idiopathic constipation 07/15/2021   Dysphagia 07/15/2021   Gastroesophageal reflux disease 07/15/2021   Uterine leiomyoma 04/19/2020   Genetic testing 04/19/2020   Family history of ovarian cancer 04/03/2020   Family history of leukemia 04/03/2020   Family history of breast cancer 04/03/2020   Malignant neoplasm of upper-outer quadrant of right breast in female, estrogen receptor negative ([place]) 03/28/2020   Overweight (BMI 25.0-29.9) 12/05/2019   Essential hypertension, benign 05/29/2018  Iritis of left eye 05/29/2018   Iron deficiency anemia due to chronic blood loss 05/29/2018   Primary hypothyroidism 05/29/2018    is  allergic to amoxicillin, ciprofloxacin, and penicillins.  MEDICAL HISTORY: Past Medical History:  Diagnosis Date   Abnormal glucose    Breast cancer ([place]) 03/2020   right breast IDC   Family history of breast cancer 04/03/2020   Family history of ovarian cancer 04/03/2020   History of radiation therapy    Right breast- 09/25/20-11/12/20- Dr. [name]   Hypertension    Hyperthyroidism    in the past   Hypothyroidism    Iron deficiency anemia    Palpitation    Personal history of chemotherapy    Personal history of radiation therapy    Spontaneous ecchymoses    Tachycardia    Vitamin D deficiency     SURGICAL HISTORY: Past Surgical History:  Procedure Laterality Date   BREAST BIOPSY     BREAST EXCISIONAL BIOPSY     BREAST LUMPECTOMY Right 08/20/2020   w/ rad w/ chemo   BREAST LUMPECTOMY WITH RADIOACTIVE SEED LOCALIZATION Right 08/20/2020   Procedure: RADIOACTIVE SEED GUIDED RIGHT BREAST LUMPECTOMY;  Surgeon: Coralie Keens, MD;  Location: [place];  Service: General;  Laterality: Right;   CHOLECYSTECTOMY     COLONOSCOPY  05/18/2018   KNEE SURGERY     PORT-A-CATH REMOVAL N/A 04/30/2021   Procedure: REMOVAL PORT-A-CATH;  Surgeon: Coralie Keens, MD;  Location: [place];  Service: General;  Laterality: N/A;   PORTACATH PLACEMENT Left 04/15/2020   Procedure: INSERTION PORT-A-CATH;  Surgeon: Coralie Keens, MD;  Location: [place];  Service: General;  Laterality: Left;   RADIOACTIVE SEED GUIDED AXILLARY SENTINEL LYMPH NODE Right 08/20/2020   Procedure: RADIOACTIVE SEED TARGETED RIGHT AXILLARY LYMPH NODE DISSECTION;  Surgeon: Coralie Keens, MD;  Location: [place];  Service: General;  Laterality: Right;   TONSILLECTOMY      SOCIAL HISTORY: Social History   Socioeconomic History   Marital status: Married    Spouse name: Not on file   Number of children: Not on file   Years of education: Not on file   Highest education level:  Not on file  Occupational History   Not on file  Tobacco Use   Smoking status: Never   Smokeless tobacco: Never  Vaping Use   Vaping Use: Never used  Substance and Sexual Activity   Alcohol use: Yes    Alcohol/week: 0.0 standard drinks of alcohol    Comment: social   Drug use: Never   Sexual activity: Not Currently    Birth control/protection: None  Other Topics Concern   Not on file  Social History Narrative   Not on file   Social Determinants of Health   Financial Resource Strain: Not on file  Food Insecurity: No Food Insecurity (04/03/2020)   Hunger Vital Sign    Worried About Running Out of Food in the Last Year: Never true    Ran Out of Food in the Last Year: Never true  Transportation Needs: No Transportation Needs (04/03/2020)   PRAPARE - Hydrologist (Medical): No    Lack of Transportation (Non-Medical): No  Physical Activity: Not on file  Stress: Not on file  Social Connections: Not on file  Intimate Partner Violence: Not on file    FAMILY HISTORY: Family History  Problem Relation Age of Onset   Diabetes Mother    Hypertension Mother    Leukemia  Father        dx > 33   Ovarian cancer Maternal Grandmother 45   Bone cancer Paternal Grandfather        dx unknown age   Cancer Maternal Uncle        Mouth and throad; dx > 28   Breast cancer Other        MGM's niece, dx unknown age   Cancer Maternal Uncle        unknown type; dx > 50    Review of Systems  Constitutional:  Negative for appetite change, chills, fatigue, fever and unexpected weight change.  HENT:   Negative for hearing loss, lump/mass and trouble swallowing.   Eyes:  Negative for eye problems and icterus.  Respiratory:  Negative for chest tightness, cough and shortness of breath.   Cardiovascular:  Negative for chest pain, leg swelling and palpitations.  Gastrointestinal:  Negative for abdominal distention, abdominal pain, constipation, diarrhea, nausea and  vomiting.  Endocrine: Negative for hot flashes.  Genitourinary:  Negative for difficulty urinating.   Musculoskeletal:  Positive for arthralgias.  Skin:  Negative for itching and rash.  Neurological:  Negative for dizziness, extremity weakness, headaches and numbness.  Hematological:  Negative for adenopathy. Does not bruise/bleed easily.  Psychiatric/Behavioral:  Negative for depression. The patient is not nervous/anxious.       PHYSICAL EXAMINATION  ECOG PERFORMANCE STATUS: 1 - Symptomatic but completely ambulatory  Vitals:   04/23/22 0847  BP: (!) 145/93  Pulse: 87  Resp: 16  Temp: 97.9 F (36.6 C)  SpO2: 99%    Physical Exam Constitutional:      General: She is not in acute distress.    Appearance: Normal appearance. She is not toxic-appearing.  HENT:     Head: Normocephalic and atraumatic.  Eyes:     General: No scleral icterus. Cardiovascular:     Rate and Rhythm: Normal rate and regular rhythm.     Pulses: Normal pulses.     Heart sounds: Normal heart sounds.  Pulmonary:     Effort: Pulmonary effort is normal.     Breath sounds: Normal breath sounds.  Abdominal:     General: Abdomen is flat. Bowel sounds are normal. There is no distension.     Palpations: Abdomen is soft.     Tenderness: There is no abdominal tenderness.  Musculoskeletal:        General: No swelling.     Cervical back: Neck supple.  Lymphadenopathy:     Cervical: No cervical adenopathy.  Skin:    General: Skin is warm and dry.     Findings: No rash.  Neurological:     General: No focal deficit present.     Mental Status: She is alert.  Psychiatric:        Mood and Affect: Mood normal.        Behavior: Behavior normal.     LABORATORY DATA:  CBC    Component Value Date/Time   WBC 4.0 04/20/2022 0806   WBC 4.3 08/16/2020 1530   RBC 4.17 04/20/2022 0806   HGB 12.9 04/20/2022 0806   HGB 13.5 01/12/2022 0925   HGB 12.3 06/26/2015 1332   HCT 38.6 04/20/2022 0806   HCT 40.4  01/12/2022 0925   HCT 36.6 06/26/2015 1332   PLT 202 04/20/2022 0806   PLT 236 01/12/2022 0925   MCV 92.6 04/20/2022 0806   MCV 91 01/12/2022 0925   MCV 90 06/26/2015 1332   MCH 30.9 04/20/2022 0806  MCHC 33.4 04/20/2022 0806   RDW 13.9 04/20/2022 0806   RDW 12.9 01/12/2022 0925   RDW 14.9 06/26/2015 1332   LYMPHSABS 1.1 04/20/2022 0806   LYMPHSABS 1.5 06/26/2015 1332   MONOABS 0.5 04/20/2022 0806   EOSABS 0.1 04/20/2022 0806   EOSABS 0.1 06/26/2015 1332   BASOSABS 0.0 04/20/2022 0806   BASOSABS 0.0 06/26/2015 1332    CMP     Component Value Date/Time   NA 141 04/20/2022 0806   NA 143 01/12/2022 0925   K 3.9 04/20/2022 0806   CL 106 04/20/2022 0806   CO2 30 04/20/2022 0806   GLUCOSE 110 (H) 04/20/2022 0806   BUN 13 04/20/2022 0806   BUN 11 01/12/2022 0925   CREATININE 0.74 04/20/2022 0806   CALCIUM 9.6 04/20/2022 0806   PROT 7.3 04/20/2022 0806   PROT 7.3 01/12/2022 0925   ALBUMIN 4.1 04/20/2022 0806   ALBUMIN 4.5 01/12/2022 0925   AST 20 04/20/2022 0806   ALT 18 04/20/2022 0806   ALKPHOS 94 04/20/2022 0806   BILITOT 0.4 04/20/2022 0806   GFRNONAA >60 04/20/2022 0806   GFRAA 101 12/05/2019 1643         ASSESSMENT and THERAPY PLAN:   Malignant neoplasm of upper-outer quadrant of right breast in female, estrogen receptor negative (HCC) Malyssa is a 51 year old woman with history of right-sided stage IIa ER weakly positive, HER2 positive breast cancer diagnosed in November 2021 status post neoadjuvant chemotherapy followed by right lumpectomy, adjuvant Herceptin and Perjeta, adjuvant radiation, and adjuvant antiestrogen therapy with letrozole which began in September 2022.    Been able to tolerate a very low amount of the antiestrogen therapy letrozole.  I recommended that she stop the therapy altogether and call us on January 2 to let us know if her pains had improved.  If her pains improve then we will send in anastrozole for her to try.  She does have a history  of hemoglobin A1c of 6.5 along with elevated LDL of greater than 100.  Considering her glucose is diagnostic for diabetes along with her LDL in the 120s I reviewed the goal is typically a LDL less than 100.  We discussed dietary measures such as avoiding meat focusing more on fruits and vegetables.    I gave her the name of 2 different documentaries to review, first game changers on [employer], second fed up on [employer].  Ellyse will see her primary care provider in March and we will repeat fasting labs at that time.  Avyana will return in April 2024 for follow-up.   All questions were answered. The patient knows to call the clinic with any problems, questions or concerns. We can certainly see the patient much sooner if necessary.  Total encounter time:30 minutes*in face-to-face visit time, chart review, lab review, care coordination, order entry, and documentation of the encounter time.    Wilber Bihari, NP 04/23/22 9:49 AM Medical Oncology and Hematology [facility] [place], [place] [address] Tel. [phone]    Fax. [phone]  *Total Encounter Time as defined by the Centers for Medicare and Medicaid Services includes, in addition to the face-to-face time of a patient visit (documented in the note above) non-face-to-face time: obtaining and reviewing outside history, ordering and reviewing medications, tests or procedures, care coordination (communications with other health care professionals or caregivers) and documentation in the medical record.

## 2022-04-23 NOTE — Assessment & Plan Note (Signed)
Shannon Kane is a 51 year old woman with history of right-sided stage IIa ER weakly positive, HER2 positive breast cancer diagnosed in November 2021 status post neoadjuvant chemotherapy followed by right lumpectomy, adjuvant Herceptin and Perjeta, adjuvant radiation, and adjuvant antiestrogen therapy with letrozole which began in September 2022.    Been able to tolerate a very low amount of the antiestrogen therapy letrozole.  I recommended that she stop the therapy altogether and call us on January 2 to let us know if her pains had improved.  If her pains improve then we will send in anastrozole for her to try.  She does have a history of hemoglobin A1c of 6.5 along with elevated LDL of greater than 100.  Considering her glucose is diagnostic for diabetes along with her LDL in the 120s I reviewed the goal is typically a LDL less than 100.  We discussed dietary measures such as avoiding meat focusing more on fruits and vegetables.    I gave her the name of 2 different documentaries to review, first game changers on [employer], second fed up on [employer].  Shannon Kane will see her primary care provider in March and we will repeat fasting labs at that time.  Shannon Kane will return in April 2024 for follow-up.

## 2022-06-29 ENCOUNTER — Ambulatory Visit: Attending: Hematology and Oncology

## 2022-06-29 DIAGNOSIS — Z483 Aftercare following surgery for neoplasm: Secondary | ICD-10-CM

## 2022-06-29 NOTE — Therapy (Signed)
OUTPATIENT PHYSICAL THERAPY SOZO SCREENING NOTE   Patient Name: Shannon Kane MRN: [id number] DOB:04-16-1971, 52 y.o., female Today's Date: 06/29/2022  PCP: [name], MD REFERRING PROVIDER: Nicholas Lose, MD   PT End of Session - 06/29/22 0904     Visit Number 9    PT Start Time 0853    PT Stop Time 0900    PT Time Calculation (min) 7 min    Activity Tolerance Patient tolerated treatment well    Behavior During Therapy [facility] for tasks assessed/performed             Past Medical History:  Diagnosis Date   Abnormal glucose    Breast cancer ([place]) 03/2020   right breast IDC   Family history of breast cancer 04/03/2020   Family history of ovarian cancer 04/03/2020   History of radiation therapy    Right breast- 09/25/20-11/12/20- Dr. [name]   Hypertension    Hyperthyroidism    in the past   Hypothyroidism    Iron deficiency anemia    Palpitation    Personal history of chemotherapy    Personal history of radiation therapy    Spontaneous ecchymoses    Tachycardia    Vitamin D deficiency    Past Surgical History:  Procedure Laterality Date   BREAST BIOPSY     BREAST EXCISIONAL BIOPSY     BREAST LUMPECTOMY Right 08/20/2020   w/ rad w/ chemo   BREAST LUMPECTOMY WITH RADIOACTIVE SEED LOCALIZATION Right 08/20/2020   Procedure: RADIOACTIVE SEED GUIDED RIGHT BREAST LUMPECTOMY;  Surgeon: Coralie Keens, MD;  Location: [place];  Service: General;  Laterality: Right;   CHOLECYSTECTOMY     COLONOSCOPY  05/18/2018   KNEE SURGERY     PORT-A-CATH REMOVAL N/A 04/30/2021   Procedure: REMOVAL PORT-A-CATH;  Surgeon: Coralie Keens, MD;  Location: [place];  Service: General;  Laterality: N/A;   PORTACATH PLACEMENT Left 04/15/2020   Procedure: INSERTION PORT-A-CATH;  Surgeon: Coralie Keens, MD;  Location: [place];  Service: General;  Laterality: Left;   RADIOACTIVE SEED GUIDED AXILLARY SENTINEL LYMPH NODE Right 08/20/2020    Procedure: RADIOACTIVE SEED TARGETED RIGHT AXILLARY LYMPH NODE DISSECTION;  Surgeon: Coralie Keens, MD;  Location: [place];  Service: General;  Laterality: Right;   TONSILLECTOMY     Patient Active Problem List   Diagnosis Date Noted   Constipation 07/15/2021   Chronic idiopathic constipation 07/15/2021   Dysphagia 07/15/2021   Gastroesophageal reflux disease 07/15/2021   Uterine leiomyoma 04/19/2020   Genetic testing 04/19/2020   Family history of ovarian cancer 04/03/2020   Family history of leukemia 04/03/2020   Family history of breast cancer 04/03/2020   Malignant neoplasm of upper-outer quadrant of right breast in female, estrogen receptor negative ([place]) 03/28/2020   Overweight (BMI 25.0-29.9) 12/05/2019   Essential hypertension, benign 05/29/2018   Iritis of left eye 05/29/2018   Iron deficiency anemia due to chronic blood loss 05/29/2018   Primary hypothyroidism 05/29/2018    REFERRING DIAG: right breast cancer at risk for lymphedema  THERAPY DIAG:  Aftercare following surgery for neoplasm  PERTINENT HISTORY: Patient was diagnosed on 03/14/2020 with right grade 2 invasive ductal carcinoma breast cancer.  She underwent neoadjuvant chemo followed by a right lumpectomy and targeted node dissection (7 negative lymph nodes) on 08/20/2020. It is ER/PR negative and HER2 positive with a Ki67 of 30%. She has a positive axillary lymph node. Still has port on the left side for infusion for Herception, Projeta. Port  removed on 04/30/2021   PRECAUTIONS: right UE Lymphedema risk, None  SUBJECTIVE: Pt returns for her 3 month L-Dex screen.   PAIN:  Are you having pain? No  SOZO SCREENING: Patient was assessed today using the SOZO machine to determine the lymphedema index score. This was compared to her baseline score. It was determined that she is within the recommended range when compared to her baseline and no further action is needed at this time. She will continue SOZO screenings.  These are done every 3 months for 2 years post operatively followed by every 6 months for 2 years, and then annually.   L-DEX FLOWSHEETS - 06/29/22 0900       L-DEX LYMPHEDEMA SCREENING   Measurement Type Unilateral    L-DEX MEASUREMENT EXTREMITY Upper Extremity    POSITION  Standing    DOMINANT SIDE Left    At Risk Side Right    BASELINE SCORE (UNILATERAL) 4.2    L-DEX SCORE (UNILATERAL) -1.7    VALUE CHANGE (UNILAT) -5.9                Otelia Limes, PTA 06/29/2022, 9:06 AM

## 2022-07-14 ENCOUNTER — Encounter: Payer: Self-pay | Admitting: Pharmacist

## 2022-07-20 ENCOUNTER — Ambulatory Visit (INDEPENDENT_AMBULATORY_CARE_PROVIDER_SITE_OTHER): Admitting: Internal Medicine

## 2022-07-20 ENCOUNTER — Encounter: Payer: Self-pay | Admitting: Internal Medicine

## 2022-07-20 VITALS — BP 136/90 | HR 68 | Temp 98.2°F | Ht 68.0 in | Wt 193.6 lb

## 2022-07-20 DIAGNOSIS — Z23 Encounter for immunization: Secondary | ICD-10-CM | POA: Diagnosis not present

## 2022-07-20 DIAGNOSIS — R7309 Other abnormal glucose: Secondary | ICD-10-CM | POA: Diagnosis not present

## 2022-07-20 DIAGNOSIS — I10 Essential (primary) hypertension: Secondary | ICD-10-CM | POA: Diagnosis not present

## 2022-07-20 DIAGNOSIS — E559 Vitamin D deficiency, unspecified: Secondary | ICD-10-CM

## 2022-07-20 DIAGNOSIS — E039 Hypothyroidism, unspecified: Secondary | ICD-10-CM

## 2022-07-20 DIAGNOSIS — Z6829 Body mass index (BMI) 29.0-29.9, adult: Secondary | ICD-10-CM

## 2022-07-20 DIAGNOSIS — Z171 Estrogen receptor negative status [ER-]: Secondary | ICD-10-CM

## 2022-07-20 DIAGNOSIS — C50411 Malignant neoplasm of upper-outer quadrant of right female breast: Secondary | ICD-10-CM | POA: Diagnosis not present

## 2022-07-20 DIAGNOSIS — Z0000 Encounter for general adult medical examination without abnormal findings: Secondary | ICD-10-CM

## 2022-07-20 LAB — POCT URINALYSIS DIPSTICK
Bilirubin, UA: NEGATIVE
Glucose, UA: NEGATIVE
Ketones, UA: NEGATIVE
Leukocytes, UA: NEGATIVE
Nitrite, UA: NEGATIVE
Protein, UA: NEGATIVE
Spec Grav, UA: 1.015 (ref 1.010–1.025)
Urobilinogen, UA: 0.2 E.U./dL
pH, UA: 8 (ref 5.0–8.0)

## 2022-07-20 NOTE — Progress Notes (Signed)
Shannon Kane,acting as a Education administrator for Shannon Greenland, MD.,have documented all relevant documentation on the behalf of Shannon Greenland, MD,as directed by  Shannon Greenland, MD while in the presence of Shannon Greenland, MD.   Subjective:     Patient ID: Shannon Kane , female    DOB: Jul 14, 1970 , 52 y.o.   MRN: [id number]   Chief Complaint  Patient presents with   Annual Exam    HPI  She is here today for a full physical examination.  Patient reports compliance with medications and has no other concerns today. Patient is established with GYN. She denies having any headaches, chest pain and shortness of breath.   BP Readings from Last 3 Encounters 07/20/22 : (!) 140/90 04/23/22 : (!) 145/93 02/05/22 : 120/64    Hypertension This is a chronic problem. The current episode started more than 1 year ago. The problem has been gradually improving since onset. The problem is controlled. Pertinent negatives include no blurred vision, chest pain, headaches or palpitations. Risk factors for coronary artery disease include post-menopausal state. Past treatments include angiotensin blockers. The current treatment provides moderate improvement. There are no compliance problems.      Past Medical History:  Diagnosis Date   Abnormal glucose    Breast cancer ([place]) 03/2020   right breast IDC   Family history of breast cancer 04/03/2020   Family history of ovarian cancer 04/03/2020   History of radiation therapy    Right breast- 09/25/20-11/12/20- Dr. [name]   Hypertension    Hyperthyroidism    in the past   Hypothyroidism    Iron deficiency anemia    Palpitation    Personal history of chemotherapy    Personal history of radiation therapy    Spontaneous ecchymoses    Tachycardia    Vitamin D deficiency      Family History  Problem Relation Age of Onset   Diabetes Mother    Hypertension Mother    Leukemia Father        dx > 61   Ovarian cancer Maternal Grandmother 67   Bone  cancer Paternal Grandfather        dx unknown age   Cancer Maternal Uncle        Mouth and throad; dx > 9   Breast cancer Other        MGM's niece, dx unknown age   Cancer Maternal Uncle        unknown type; dx > 50     Current Outpatient Medications:    amLODipine (NORVASC) 2.5 MG tablet, TAKE 1 TABLET DAILY, Disp: 90 tablet, Rfl: 3   Cholecalciferol (VITAMIN D-3 PO), Take 5,000 Units by mouth daily., Disp: , Rfl:    letrozole (FEMARA) 2.5 MG tablet, Take 1 tablet (2.5 mg total) by mouth daily., Disp: 90 tablet, Rfl: 3   MAGNESIUM CITRATE PO, Take 400 mcg by mouth at bedtime as needed (sleep). Only when having trouble sleeping, Disp: , Rfl:    SYNTHROID 75 MCG tablet, TAKE 1 TABLET DAILY BEFORE BREAKFAST, Disp: 90 tablet, Rfl: 3   valsartan (DIOVAN) 160 MG tablet, Take 1 tablet (160 mg total) by mouth daily., Disp: 90 tablet, Rfl: 2   vitamin B-12 (CYANOCOBALAMIN) 1000 MCG tablet, Take 1,000 mcg by mouth daily., Disp: , Rfl:    Allergies  Allergen Reactions   Amoxicillin     Tolerates ANCEF   Ciprofloxacin Itching   Penicillins Hives    Tolerates ANCEF  The patient states she uses post menopausal status for birth control. Last LMP was Patient's last menstrual period was 09/16/2019.. Negative for Dysmenorrhea. Negative for: breast discharge, breast lump(s), breast pain and breast self exam. Associated symptoms include abnormal vaginal bleeding. Pertinent negatives include abnormal bleeding (hematology), anxiety, decreased libido, depression, difficulty falling sleep, dyspareunia, history of infertility, nocturia, sexual dysfunction, sleep disturbances, urinary incontinence, urinary urgency, vaginal discharge and vaginal itching. Diet regular.The patient states her exercise level is  moderate.  . The patient's tobacco use is:  Social History   Tobacco Use  Smoking Status Never  Smokeless Tobacco Never  . She has been exposed to passive smoke. The patient's alcohol use is:   Social History   Substance and Sexual Activity  Alcohol Use Yes   Alcohol/week: 0.0 standard drinks of alcohol   Comment: social    Review of Systems  Constitutional: Negative.   HENT: Negative.    Eyes: Negative.  Negative for blurred vision.  Respiratory: Negative.    Cardiovascular: Negative.  Negative for chest pain and palpitations.  Gastrointestinal: Negative.   Endocrine: Negative.   Genitourinary: Negative.   Musculoskeletal: Negative.   Skin: Negative.   Allergic/Immunologic: Negative.   Neurological: Negative.  Negative for headaches.  Hematological: Negative.   Psychiatric/Behavioral: Negative.       Today's Vitals   07/20/22 0859 07/20/22 0913  BP: (!) 140/90 (!) 136/90  Pulse: 68   Temp: 98.2 F (36.8 C)   TempSrc: Oral   Weight: 193 lb 9.6 oz (87.8 kg)   Height: '5\' 8"'$  (1.727 m)   PainSc: 0-No pain    Body mass index is 29.44 kg/m.  Wt Readings from Last 3 Encounters:  07/20/22 193 lb 9.6 oz (87.8 kg)  04/23/22 203 lb 1.6 oz (92.1 kg)  03/30/22 201 lb (91.2 kg)   BP Readings from Last 3 Encounters:  07/20/22 (!) 136/90  04/23/22 (!) 145/93  02/05/22 120/64     Objective:  Physical Exam Vitals and nursing note reviewed.  Constitutional:      Appearance: Normal appearance.  HENT:     Head: Normocephalic and atraumatic.     Right Ear: Tympanic membrane, ear canal and external ear normal.     Left Ear: Tympanic membrane, ear canal and external ear normal.     Nose:     Comments: Masked     Mouth/Throat:     Comments: Masked  Eyes:     Extraocular Movements: Extraocular movements intact.     Conjunctiva/sclera: Conjunctivae normal.     Pupils: Pupils are equal, round, and reactive to light.  Cardiovascular:     Rate and Rhythm: Normal rate and regular rhythm.     Pulses: Normal pulses.     Heart sounds: Normal heart sounds.  Pulmonary:     Effort: Pulmonary effort is normal.     Breath sounds: Normal breath sounds.  Chest:   Breasts:    Tanner Score is 5.     Right: Normal.     Left: Normal.     Comments: Healed surgical scar R breast Abdominal:     General: Abdomen is flat. Bowel sounds are normal.     Palpations: Abdomen is soft.  Genitourinary:    Comments: deferred Musculoskeletal:        General: Normal range of motion.     Cervical back: Normal range of motion and neck supple.  Skin:    General: Skin is warm and dry.  Neurological:  General: No focal deficit present.     Mental Status: She is alert and oriented to person, place, and time.  Psychiatric:        Mood and Affect: Mood normal.        Behavior: Behavior normal.       Assessment And Plan:     1. Encounter for general adult medical examination w/o abnormal findings Comments: A full exam was performed.  Importance of monthly self breast exams was discussed with the patient. PATIENT IS ADVISED TO GET 30-45 MINUTES REGULAR EXERCISE NO LESS THAN FOUR TO FIVE DAYS PER WEEK - BOTH WEIGHTBEARING EXERCISES AND AEROBIC ARE RECOMMENDED.  PATIENT IS ADVISED TO FOLLOW A HEALTHY DIET WITH AT LEAST SIX FRUITS/VEGGIES PER DAY, DECREASE INTAKE OF RED MEAT, AND TO INCREASE FISH INTAKE TO TWO DAYS PER WEEK.  MEATS/FISH SHOULD NOT BE FRIED, BAKED OR BROILED IS PREFERABLE.  IT IS ALSO IMPORTANT TO CUT BACK ON YOUR SUGAR INTAKE. PLEASE AVOID ANYTHING WITH ADDED SUGAR, CORN SYRUP OR OTHER SWEETENERS. IF YOU MUST USE A SWEETENER, YOU CAN TRY STEVIA. IT IS ALSO IMPORTANT TO AVOID ARTIFICIALLY SWEETENERS AND DIET BEVERAGES. LASTLY, I SUGGEST WEARING SPF 50 SUNSCREEN ON EXPOSED PARTS AND ESPECIALLY WHEN IN THE DIRECT SUNLIGHT FOR AN EXTENDED PERIOD OF TIME.  PLEASE AVOID FAST FOOD RESTAURANTS AND INCREASE YOUR WATER INTAKE.  2. Essential hypertension, benign Comments: Uncontrolled. EKG performed, NSR w/o acute changes.Unfortunately, she did not take her amlodipine this morning so I can't get an accurate assessment of her BP.  After further questioning, she admits  that she does not take it regularly. She will rto in 2 weeks for nurse visit. I will make further recommendations at that time.  - POCT Urinalysis Dipstick (81002) - Microalbumin / creatinine urine ratio - EKG 12-Lead - CMP14+EGFR - Lipid panel - CBC  3. Malignant neoplasm of upper-outer quadrant of right breast in female, estrogen receptor negative ([place]) Comments: Diagnosed in November 2021 s/p neoadjuvant chemo followed by R lumpectomy, Herceptin/ Perjeta, adjuvant radiation, & antiestrogen therapy with letrozole.  4. Primary hypothyroidism Comments: I will check thyroid panel and adjust meds as needed. - TSH - T4, free  5. Other abnormal glucose Comments: Previous labs reviewed, her a1c has been elevated in the past. I will recheck this today. - POCT Urinalysis Dipstick (81002) - Microalbumin / creatinine urine ratio - EKG 12-Lead - Hemoglobin A1c  6. Vitamin D deficiency Comments: I will check a vitamin D level and supplement as needed. - Vitamin D (25 hydroxy)  7. BMI 29.0-29.9,adult Comments: She was congratulated on her 10lb weight loss since December 2023. She is now drinking more water and practicing intermittent fasting.  8. Need for zoster vaccination - Zoster Recombinant (Shingrix )  Patient was given opportunity to ask questions. Patient verbalized understanding of the plan and was able to repeat key elements of the plan. All questions were answered to their satisfaction.   I, Shannon Greenland, MD, have reviewed all documentation for this visit. The documentation on 07/20/22 for the exam, diagnosis, procedures, and orders are all accurate and complete.   THE PATIENT IS ENCOURAGED TO PRACTICE SOCIAL DISTANCING DUE TO THE COVID-19 PANDEMIC.

## 2022-07-20 NOTE — Patient Instructions (Addendum)
Papaya seeds Dandelion root tea/milk thistle  Health Maintenance, Female Adopting a healthy lifestyle and getting preventive care are important in promoting health and wellness. Ask your health care provider about: The right schedule for you to have regular tests and exams. Things you can do on your own to prevent diseases and keep yourself healthy. What should I know about diet, weight, and exercise? Eat a healthy diet  Eat a diet that includes plenty of vegetables, fruits, low-fat dairy products, and lean protein. Do not eat a lot of foods that are high in solid fats, added sugars, or sodium. Maintain a healthy weight Body mass index (BMI) is used to identify weight problems. It estimates body fat based on height and weight. Your health care provider can help determine your BMI and help you achieve or maintain a healthy weight. Get regular exercise Get regular exercise. This is one of the most important things you can do for your health. Most adults should: Exercise for at least 150 minutes each week. The exercise should increase your heart rate and make you sweat (moderate-intensity exercise). Do strengthening exercises at least twice a week. This is in addition to the moderate-intensity exercise. Spend less time sitting. Even light physical activity can be beneficial. Watch cholesterol and blood lipids Have your blood tested for lipids and cholesterol at 54 years of age, then have this test every 5 years. Have your cholesterol levels checked more often if: Your lipid or cholesterol levels are high. You are older than 54 years of age. You are at high risk for heart disease. What should I know about cancer screening? Depending on your health history and family history, you may need to have cancer screening at various ages. This may include screening for: Breast cancer. Cervical cancer. Colorectal cancer. Skin cancer. Lung cancer. What should I know about heart disease, diabetes, and  high blood pressure? Blood pressure and heart disease High blood pressure causes heart disease and increases the risk of stroke. This is more likely to develop in people who have high blood pressure readings or are overweight. Have your blood pressure checked: Every 3-5 years if you are 95-13 years of age. Every year if you are 27 years old or older. Diabetes Have regular diabetes screenings. This checks your fasting blood sugar level. Have the screening done: Once every three years after age 47 if you are at a normal weight and have a low risk for diabetes. More often and at a younger age if you are overweight or have a high risk for diabetes. What should I know about preventing infection? Hepatitis B If you have a higher risk for hepatitis B, you should be screened for this virus. Talk with your health care provider to find out if you are at risk for hepatitis B infection. Hepatitis C Testing is recommended for: Everyone born from [birth year] through 1967. Anyone with known risk factors for hepatitis C. Sexually transmitted infections (STIs) Get screened for STIs, including gonorrhea and chlamydia, if: You are sexually active and are younger than 54 years of age. You are older than 54 years of age and your health care provider tells you that you are at risk for this type of infection. Your sexual activity has changed since you were last screened, and you are at increased risk for chlamydia or gonorrhea. Ask your health care provider if you are at risk. Ask your health care provider about whether you are at high risk for HIV. Your health care provider may recommend  a prescription medicine to help prevent HIV infection. If you choose to take medicine to prevent HIV, you should first get tested for HIV. You should then be tested every 3 months for as long as you are taking the medicine. Pregnancy If you are about to stop having your period (premenopausal) and you may become pregnant, seek counseling  before you get pregnant. Take 400 to 800 micrograms (mcg) of folic acid every day if you become pregnant. Ask for birth control (contraception) if you want to prevent pregnancy. Osteoporosis and menopause Osteoporosis is a disease in which the bones lose minerals and strength with aging. This can result in bone fractures. If you are 34 years old or older, or if you are at risk for osteoporosis and fractures, ask your health care provider if you should: Be screened for bone loss. Take a calcium or vitamin D supplement to lower your risk of fractures. Be given hormone replacement therapy (HRT) to treat symptoms of menopause. Follow these instructions at home: Alcohol use Do not drink alcohol if: Your health care provider tells you not to drink. You are pregnant, may be pregnant, or are planning to become pregnant. If you drink alcohol: Limit how much you have to: 0-1 drink a day. Know how much alcohol is in your drink. In the [place] one drink equals one 12 oz bottle of beer (355 mL), one 5 oz glass of wine (148 mL), or one 1 oz glass of hard liquor (44 mL). Lifestyle Do not use any products that contain nicotine or tobacco. These products include cigarettes, chewing tobacco, and vaping devices, such as e-cigarettes. If you need help quitting, ask your health care provider. Do not use street drugs. Do not share needles. Ask your health care provider for help if you need support or information about quitting drugs. General instructions Schedule regular health, dental, and eye exams. Stay current with your vaccines. Tell your health care provider if: You often feel depressed. You have ever been abused or do not feel safe at home. Summary Adopting a healthy lifestyle and getting preventive care are important in promoting health and wellness. Follow your health care provider's instructions about healthy diet, exercising, and getting tested or screened for diseases. Follow your health care  provider's instructions on monitoring your cholesterol and blood pressure. This information is not intended to replace advice given to you by your health care provider. Make sure you discuss any questions you have with your health care provider. Document Revised: 09/23/2020 Document Reviewed: 09/23/2020 Elsevier Patient Education  [employer].

## 2022-07-21 LAB — LIPID PANEL
Chol/HDL Ratio: 3.3 ratio (ref 0.0–4.4)
Cholesterol, Total: 164 mg/dL (ref 100–199)
HDL: 49 mg/dL (ref >39)
LDL Chol Calc (NIH): 101 mg/dL — ABNORMAL HIGH (ref 0–99)
Triglycerides: 72 mg/dL (ref 0–149)
VLDL Cholesterol Cal: 14 mg/dL (ref 5–40)

## 2022-07-21 LAB — CMP14+EGFR
ALT: 14 IU/L (ref 0–32)
AST: 22 IU/L (ref 0–40)
Albumin/Globulin Ratio: 1.5 (ref 1.2–2.2)
Albumin: 4.3 g/dL (ref 3.8–4.9)
Alkaline Phosphatase: 131 IU/L — ABNORMAL HIGH (ref 44–121)
BUN/Creatinine Ratio: 11 (ref 9–23)
BUN: 9 mg/dL (ref 6–24)
Bilirubin Total: 0.5 mg/dL (ref 0.0–1.2)
CO2: 25 mmol/L (ref 20–29)
Calcium: 9.5 mg/dL (ref 8.7–10.2)
Chloride: 105 mmol/L (ref 96–106)
Creatinine, Ser: 0.83 mg/dL (ref 0.57–1.00)
Globulin, Total: 2.9 g/dL (ref 1.5–4.5)
Glucose: 89 mg/dL (ref 70–99)
Potassium: 4.2 mmol/L (ref 3.5–5.2)
Sodium: 142 mmol/L (ref 134–144)
Total Protein: 7.2 g/dL (ref 6.0–8.5)
eGFR: 84 mL/min/{1.73_m2} (ref >59)

## 2022-07-21 LAB — CBC
Hematocrit: 41.7 % (ref 34.0–46.6)
Hemoglobin: 13.7 g/dL (ref 11.1–15.9)
MCH: 30.1 pg (ref 26.6–33.0)
MCHC: 32.9 g/dL (ref 31.5–35.7)
MCV: 92 fL (ref 79–97)
Platelets: 241 10*3/uL (ref 150–450)
RBC: 4.55 x10E6/uL (ref 3.77–5.28)
RDW: 12.4 % (ref 11.7–15.4)
WBC: 4.7 10*3/uL (ref 3.4–10.8)

## 2022-07-21 LAB — HEMOGLOBIN A1C
Est. average glucose Bld gHb Est-mCnc: 128 mg/dL
Hgb A1c MFr Bld: 6.1 % — ABNORMAL HIGH (ref 4.8–5.6)

## 2022-07-21 LAB — VITAMIN D 25 HYDROXY (VIT D DEFICIENCY, FRACTURES): Vit D, 25-Hydroxy: 62.7 ng/mL (ref 30.0–100.0)

## 2022-07-21 LAB — MICROALBUMIN / CREATININE URINE RATIO
Creatinine, Urine: 18.4 mg/dL
Microalb/Creat Ratio: <16 mg/g creat (ref 0–29)
Microalbumin, Urine: <3 ug/mL

## 2022-07-21 LAB — TSH: TSH: 1.71 u[IU]/mL (ref 0.450–4.500)

## 2022-07-21 LAB — T4, FREE: Free T4: 1.59 ng/dL (ref 0.82–1.77)

## 2022-08-04 ENCOUNTER — Ambulatory Visit

## 2022-08-04 ENCOUNTER — Other Ambulatory Visit: Payer: Self-pay

## 2022-08-04 VITALS — BP 140/76 | HR 98 | Temp 98.7°F | Ht 68.0 in | Wt 193.0 lb

## 2022-08-04 DIAGNOSIS — I10 Essential (primary) hypertension: Secondary | ICD-10-CM

## 2022-08-04 MED ORDER — VALSARTAN 160 MG PO TABS: 160.0000 mg | ORAL_TABLET | Freq: Every day | ORAL | 1 refills | Status: DC

## 2022-08-04 NOTE — Progress Notes (Signed)
Patient is here for a bp check. amLopipine 2.5., valsartan 160mg . Patient ran out of valsartan. BP Readings from Last 3 Encounters:  08/04/22 (!) 140/76  07/20/22 (!) 136/90  04/23/22 (!) 145/93   Had resch due to having ran out of meds

## 2022-08-04 NOTE — Progress Notes (Deleted)
Patient presents today for a BP check, patient currently taking amLODipine 2.5mg , valsartan 160mg 

## 2022-08-18 ENCOUNTER — Ambulatory Visit

## 2022-08-18 VITALS — BP 152/80 | HR 98 | Temp 98.5°F | Ht 68.0 in | Wt 193.0 lb

## 2022-08-18 DIAGNOSIS — I10 Essential (primary) hypertension: Secondary | ICD-10-CM

## 2022-08-18 NOTE — Progress Notes (Signed)
Patient presents today for BP check, patient currently taking amLODipine 2.5mg  AM, Valsartan 160mg  PM.  BP Readings from Last 3 Encounters:  08/18/22 (!) 140/80  08/04/22 (!) 140/76  07/20/22 (!) 136/90  Per provider- incresae amlodipine to 5mg and referral to HTN clinic.  Patient declined starting amLODipine 5mg  but agrees to see HTN clinic.

## 2022-08-23 ENCOUNTER — Other Ambulatory Visit: Payer: Self-pay | Admitting: Internal Medicine

## 2022-08-23 DIAGNOSIS — I10 Essential (primary) hypertension: Secondary | ICD-10-CM

## 2022-08-24 ENCOUNTER — Inpatient Hospital Stay: Attending: Adult Health | Admitting: Adult Health

## 2022-08-24 VITALS — BP 138/76 | HR 89 | Temp 97.9°F | Resp 16 | Ht 68.0 in | Wt 191.7 lb

## 2022-08-24 DIAGNOSIS — M8588 Other specified disorders of bone density and structure, other site: Secondary | ICD-10-CM

## 2022-08-24 DIAGNOSIS — M8580 Other specified disorders of bone density and structure, unspecified site: Secondary | ICD-10-CM | POA: Insufficient documentation

## 2022-08-24 DIAGNOSIS — Z79811 Long term (current) use of aromatase inhibitors: Secondary | ICD-10-CM | POA: Diagnosis not present

## 2022-08-24 DIAGNOSIS — Z171 Estrogen receptor negative status [ER-]: Secondary | ICD-10-CM | POA: Diagnosis not present

## 2022-08-24 DIAGNOSIS — Z806 Family history of leukemia: Secondary | ICD-10-CM | POA: Insufficient documentation

## 2022-08-24 DIAGNOSIS — Z803 Family history of malignant neoplasm of breast: Secondary | ICD-10-CM | POA: Diagnosis not present

## 2022-08-24 DIAGNOSIS — Z8041 Family history of malignant neoplasm of ovary: Secondary | ICD-10-CM | POA: Diagnosis not present

## 2022-08-24 DIAGNOSIS — C50411 Malignant neoplasm of upper-outer quadrant of right female breast: Secondary | ICD-10-CM | POA: Diagnosis present

## 2022-08-24 DIAGNOSIS — Z170 Estrogen receptor positive status [ER+]: Secondary | ICD-10-CM | POA: Diagnosis not present

## 2022-08-24 MED ORDER — ANASTROZOLE 1 MG PO TABS: 1.0000 mg | ORAL_TABLET | Freq: Every day | ORAL | 1 refills | Status: DC

## 2022-08-24 NOTE — Progress Notes (Signed)
[facility] Cancer Follow up:    Shannon Peng, MD [address] [place] [address] [place] [address]   DIAGNOSIS:  Cancer Staging  Malignant neoplasm of upper-outer quadrant of right breast in female, estrogen receptor negative Staging form: Breast, AJCC 8th Edition - Clinical stage from 04/03/2020: Stage IIA (cT1c, cN1, cM0, G1, ER-, PR-, HER2+) - Unsigned Stage prefix: Initial diagnosis Histologic grading system: 3 grade system   SUMMARY OF ONCOLOGIC HISTORY: Oncology History  Malignant neoplasm of upper-outer quadrant of right breast in female, estrogen receptor negative  03/28/2020 Initial Diagnosis   Patient palpated a right breast mass x2 wks. Mammogram and US showed a 2.0cm mass at the 10 o'clock position in the right breast and two enlarged right axillary lymph nodes, 2.0cm and 1.7cm. 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   04/16/2020 - 07/30/2020 Chemotherapy   TCHP x6 cycles, pathologic complete response, Herceptin Perjeta maintenance    04/18/2020 Genetic Testing   Negative genetic testing: no pathogenic variants detected in [facility] Multi-Cancer Panel.  Variants of uncertain significance detected in CDH1 at c.1289T>G (p.Val430Gly) and EGFR at c.2873G>A (p.Arg958His).  The report date is April 18, 2020.   The Multi-Cancer Panel offered by [facility] includes sequencing and/or deletion duplication testing of the following 85 genes: AIP, ALK, APC, ATM, AXIN2,BAP1,  BARD1, BLM, BMPR1A, BRCA1, BRCA2, BRIP1, CASR, CDC73, CDH1, CDK4, CDKN1B, CDKN1C, CDKN2A (p14ARF), CDKN2A (p16INK4a), CEBPA, CHEK2, CTNNA1, DICER1, DIS3L2, EGFR, EPCAM (Deletion/duplication testing only), FH, FLCN, GATA2, GPC3, GREM1 (Promoter region deletion/duplication testing only), HOXB13 (c.251G>A, p.Gly84Glu), HRAS, KIT, MAX, MEN1, MET, MITF (c.952G>A, p.Glu318Lys variant only), MLH1, MSH2, MSH3, MSH6, MUTYH, NBN, NF1, NF2, NTHL1,  PALB2, PDGFRA, PHOX2B, PMS2, POLD1, POLE, POT1, PRKAR1A, PTCH1, PTEN, RAD50, RAD51C, RAD51D, RB1, RECQL4, RET, RNF43, RUNX1, SDHAF2, SDHA (sequence changes only), SDHB, SDHC, SDHD, SMAD4, SMARCA4, SMARCB1, SMARCE1, STK11, SUFU, TERC, TERT, TMEM127, TP53, TSC1, TSC2, VHL, WRN and WT1.    08/20/2020 Surgery   Right lumpectomy Shannon Kane): no residual carcinoma, 7 right axillary lymph nodes negative for carcinoma.   09/10/2020 - 04/15/2021 Chemotherapy   Patient is on Treatment Plan : BREAST Trastuzumab + Pertuzumab q21d      09/26/2020 - 11/12/2020 Radiation Therapy   Adjuvant radiation   01/21/2021 -  Anti-estrogen oral therapy   Letrozole     CURRENT THERAPY: Letrozole  INTERVAL HISTORY: Shannon Kane 52 y.o. female returns for follow-up of her history of breast cancer.  She was previously on letrozole however had an increased level of difficulty in tolerating it due to arthralgias.  She was recommended to discontinue the letrozole in early December 2023.  She notes that since discontinuing the letrozole she has felt substantially better.  She is now walking barefoot without any issues in her feet and has been able to exercise without difficulty.  She is now exercising 5 days a week and has lost about 12 pounds since we saw her last in December.  She denies any breast concerns and her next mammogram is due in October 2024.  He also has osteopenia and a bone density due in August 2024.   Patient Active Problem List   Diagnosis Date Noted   Constipation 07/15/2021   Chronic idiopathic constipation 07/15/2021   Dysphagia 07/15/2021   Gastroesophageal reflux disease 07/15/2021   Uterine leiomyoma 04/19/2020   Genetic testing 04/19/2020   Family history of ovarian cancer 04/03/2020   Family history of leukemia 04/03/2020  Family history of breast cancer 04/03/2020   Malignant neoplasm of upper-outer quadrant of right breast in female, estrogen receptor negative 03/28/2020   Overweight (BMI  25.0-29.9) 12/05/2019   Essential hypertension, benign 05/29/2018   Iritis of left eye 05/29/2018   Iron deficiency anemia due to chronic blood loss 05/29/2018   Primary hypothyroidism 05/29/2018    is allergic to amoxicillin, ciprofloxacin, and penicillins.  MEDICAL HISTORY: Past Medical History:  Diagnosis Date   Abnormal glucose    Breast cancer (HCC) 03/2020   right breast IDC   Family history of breast cancer 04/03/2020   Family history of ovarian cancer 04/03/2020   History of radiation therapy    Right breast- 09/25/20-11/12/20- Shannon Kane   Hypertension    Hyperthyroidism    in the past   Hypothyroidism    Iron deficiency anemia    Palpitation    Personal history of chemotherapy    Personal history of radiation therapy    Spontaneous ecchymoses    Tachycardia    Vitamin D deficiency     SURGICAL HISTORY: Past Surgical History:  Procedure Laterality Date   BREAST BIOPSY     BREAST EXCISIONAL BIOPSY     BREAST LUMPECTOMY Right 08/20/2020   w/ rad w/ chemo   BREAST LUMPECTOMY WITH RADIOACTIVE SEED LOCALIZATION Right 08/20/2020   Procedure: RADIOACTIVE SEED GUIDED RIGHT BREAST LUMPECTOMY;  Surgeon: Shannon Miyamoto, MD;  Location: [facility];  Service: General;  Laterality: Right;   CHOLECYSTECTOMY     COLONOSCOPY  05/18/2018   KNEE SURGERY     PORT-A-CATH REMOVAL N/A 04/30/2021   Procedure: REMOVAL PORT-A-CATH;  Surgeon: Shannon Miyamoto, MD;  Location: [facility];  Service: General;  Laterality: N/A;   PORTACATH PLACEMENT Left 04/15/2020   Procedure: INSERTION PORT-A-CATH;  Surgeon: Shannon Miyamoto, MD;  Location: [facility];  Service: General;  Laterality: Left;   RADIOACTIVE SEED GUIDED AXILLARY SENTINEL LYMPH NODE Right 08/20/2020   Procedure: RADIOACTIVE SEED TARGETED RIGHT AXILLARY LYMPH NODE DISSECTION;  Surgeon: Shannon Miyamoto, MD;  Location: [facility];  Service: General;  Laterality: Right;   TONSILLECTOMY       SOCIAL HISTORY: Social History   Socioeconomic History   Marital status: Married    Spouse name: Not on file   Number of children: Not on file   Years of education: Not on file   Highest education level: Not on file  Occupational History   Not on file  Tobacco Use   Smoking status: Never   Smokeless tobacco: Never  Vaping Use   Vaping Use: Never used  Substance and Sexual Activity   Alcohol use: Yes    Alcohol/week: 0.0 standard drinks of alcohol    Comment: social   Drug use: Never   Sexual activity: Not Currently    Birth control/protection: None  Other Topics Concern   Not on file  Social History Narrative   Not on file   Social Determinants of Health   Financial Resource Strain: Not on file  Food Insecurity: No Food Insecurity (04/03/2020)   Hunger Vital Sign    Worried About Running Out of Food in the Last Year: Never true    Ran Out of Food in the Last Year: Never true  Transportation Needs: No Transportation Needs (04/03/2020)   PRAPARE - Administrator, Civil Service (Medical): No    Lack of Transportation (Non-Medical): No  Physical Activity: Not on file  Stress: Not on file  Social Connections:  Not on file  Intimate Partner Violence: Not on file    FAMILY HISTORY: Family History  Problem Relation Age of Onset   Diabetes Mother    Hypertension Mother    Leukemia Father        dx > 50   Ovarian cancer Maternal Grandmother 19   Bone cancer Paternal Grandfather        dx unknown age   Cancer Maternal Uncle        Mouth and throad; dx > 50   Breast cancer Other        MGM's niece, dx unknown age   Cancer Maternal Uncle        unknown type; dx > 50    Review of Systems  Constitutional:  Negative for appetite change, chills, fatigue, fever and unexpected weight change.  HENT:   Negative for hearing loss, lump/mass and trouble swallowing.   Eyes:  Negative for eye problems and icterus.  Respiratory:  Negative for chest tightness,  cough and shortness of breath.   Cardiovascular:  Negative for chest pain, leg swelling and palpitations.  Gastrointestinal:  Negative for abdominal distention, abdominal pain, constipation, diarrhea, nausea and vomiting.  Endocrine: Negative for hot flashes.  Genitourinary:  Negative for difficulty urinating.   Musculoskeletal:  Negative for arthralgias.  Skin:  Negative for itching and rash.  Neurological:  Negative for dizziness, extremity weakness, headaches and numbness.  Hematological:  Negative for adenopathy. Does not bruise/bleed easily.  Psychiatric/Behavioral:  Negative for depression. The patient is not nervous/anxious.       PHYSICAL EXAMINATION  ECOG PERFORMANCE STATUS: 0 - Asymptomatic  Vitals:   08/24/22 0848  BP: 138/76  Pulse: 89  Resp: 16  Temp: 97.9 F (36.6 C)  SpO2: 100%    Physical Exam Constitutional:      General: She is not in acute distress.    Appearance: Normal appearance. She is not toxic-appearing.  HENT:     Head: Normocephalic and atraumatic.  Eyes:     General: No scleral icterus. Cardiovascular:     Rate and Rhythm: Normal rate and regular rhythm.     Pulses: Normal pulses.     Heart sounds: Normal heart sounds.  Pulmonary:     Effort: Pulmonary effort is normal.     Breath sounds: Normal breath sounds.  Abdominal:     General: Abdomen is flat. Bowel sounds are normal. There is no distension.     Palpations: Abdomen is soft.     Tenderness: There is no abdominal tenderness.  Musculoskeletal:        General: No swelling.     Cervical back: Neck supple.  Lymphadenopathy:     Cervical: No cervical adenopathy.  Skin:    General: Skin is warm and dry.     Findings: No rash.  Neurological:     General: No focal deficit present.     Mental Status: She is alert.  Psychiatric:        Mood and Affect: Mood normal.        Behavior: Behavior normal.     LABORATORY DATA:  CBC    Component Value Date/Time   WBC 4.7 07/20/2022  1011   WBC 4.0 04/20/2022 0806   WBC 4.3 08/16/2020 1530   RBC 4.55 07/20/2022 1011   RBC 4.17 04/20/2022 0806   HGB 13.7 07/20/2022 1011   HGB 12.3 06/26/2015 1332   HCT 41.7 07/20/2022 1011   HCT 36.6 06/26/2015 1332   PLT 241  07/20/2022 1011   MCV 92 07/20/2022 1011   MCV 90 06/26/2015 1332   MCH 30.1 07/20/2022 1011   MCH 30.9 04/20/2022 0806   MCHC 32.9 07/20/2022 1011   MCHC 33.4 04/20/2022 0806   RDW 12.4 07/20/2022 1011   RDW 14.9 06/26/2015 1332   LYMPHSABS 1.1 04/20/2022 0806   LYMPHSABS 1.5 06/26/2015 1332   MONOABS 0.5 04/20/2022 0806   EOSABS 0.1 04/20/2022 0806   EOSABS 0.1 06/26/2015 1332   BASOSABS 0.0 04/20/2022 0806   BASOSABS 0.0 06/26/2015 1332    CMP     Component Value Date/Time   NA 142 07/20/2022 1011   K 4.2 07/20/2022 1011   CL 105 07/20/2022 1011   CO2 25 07/20/2022 1011   GLUCOSE 89 07/20/2022 1011   GLUCOSE 110 (H) 04/20/2022 0806   BUN 9 07/20/2022 1011   CREATININE 0.83 07/20/2022 1011   CREATININE 0.74 04/20/2022 0806   CALCIUM 9.5 07/20/2022 1011   PROT 7.2 07/20/2022 1011   ALBUMIN 4.3 07/20/2022 1011   AST 22 07/20/2022 1011   AST 20 04/20/2022 0806   ALT 14 07/20/2022 1011   ALT 18 04/20/2022 0806   ALKPHOS 131 (H) 07/20/2022 1011   BILITOT 0.5 07/20/2022 1011   BILITOT 0.4 04/20/2022 0806   GFRNONAA >60 04/20/2022 0806   GFRAA 101 12/05/2019 1643       ASSESSMENT and THERAPY PLAN:   Malignant neoplasm of upper-outer quadrant of right breast in female, estrogen receptor negative (HCC) Shannon Kane is a 52 year old woman with history of right-sided stage IIa ER weakly positive, HER2 positive breast cancer diagnosed in November 2021 status post neoadjuvant chemotherapy followed by right lumpectomy, adjuvant Herceptin and Perjeta, adjuvant radiation, and adjuvant antiestrogen therapy with letrozole which began in September 2022- discontinued 04/2022 due to arthralgias.   Treatment plan:  Antiestrogen therapy for weak estrogen  positivity-Beginning Anastrozole 1/2 tab daily.  If she doesn't tolerate this we will try Tamoxifen daily.    Risks and benefits of the above discussed in detail.    Mammogram due 02/2023 Healthy diet, exercise, and weight loss discussed. Bone density due in 12/2022; orders placed today.  RTC in 01/2023 for f/u with Dr. Pamelia HoitGudena Patient instructed to call if she doesn't tolerate Anastrozole     All questions were answered. The patient knows to call the clinic with any problems, questions or concerns. We can certainly see the patient much sooner if necessary.  Total encounter time:30 minutes*in face-to-face visit time, chart review, lab review, care coordination, order entry, and documentation of the encounter time.    Lillard AnesLindsey Fransico Sciandra, NP 08/24/22 9:51 AM Medical Oncology and Hematology [facility] [address] [place], [place] [address] Tel. [phone]    Fax. [phone]  *Total Encounter Time as defined by the Centers for Medicare and Medicaid Services includes, in addition to the face-to-face time of a patient visit (documented in the note above) non-face-to-face time: obtaining and reviewing outside history, ordering and reviewing medications, tests or procedures, care coordination (communications with other health care professionals or caregivers) and documentation in the medical record.

## 2022-08-24 NOTE — Assessment & Plan Note (Addendum)
Shannon Kane is a 52 year old woman with history of right-sided stage IIa ER weakly positive, HER2 positive breast cancer diagnosed in November 2021 status post neoadjuvant chemotherapy followed by right lumpectomy, adjuvant Herceptin and Perjeta, adjuvant radiation, and adjuvant antiestrogen therapy with letrozole which began in September 2022- discontinued 04/2022 due to arthralgias.   Treatment plan:  Antiestrogen therapy for weak estrogen positivity-Beginning Anastrozole 1/2 tab daily.  If she doesn't tolerate this we will try Tamoxifen daily.    Risks and benefits of the above discussed in detail.    Mammogram due 02/2023 Healthy diet, exercise, and weight loss discussed. Bone density due in 12/2022; orders placed today.  RTC in 01/2023 for f/u with Shannon Kane Patient instructed to call if she doesn't tolerate Anastrozole

## 2022-10-15 ENCOUNTER — Encounter: Payer: Self-pay | Admitting: *Deleted

## 2022-10-15 ENCOUNTER — Encounter (HOSPITAL_BASED_OUTPATIENT_CLINIC_OR_DEPARTMENT_OTHER): Payer: Self-pay | Admitting: Family

## 2022-10-15 ENCOUNTER — Ambulatory Visit (HOSPITAL_BASED_OUTPATIENT_CLINIC_OR_DEPARTMENT_OTHER): Admitting: Family

## 2022-10-15 VITALS — BP 145/100 | HR 84 | Ht 68.0 in | Wt 191.3 lb

## 2022-10-15 DIAGNOSIS — Z006 Encounter for examination for normal comparison and control in clinical research program: Secondary | ICD-10-CM

## 2022-10-15 DIAGNOSIS — I10 Essential (primary) hypertension: Secondary | ICD-10-CM

## 2022-10-15 DIAGNOSIS — E782 Mixed hyperlipidemia: Secondary | ICD-10-CM | POA: Diagnosis not present

## 2022-10-15 MED ORDER — AMLODIPINE BESYLATE 2.5 MG PO TABS
5.0000 mg | ORAL_TABLET | Freq: Every day | ORAL | 3 refills | Status: DC
Start: 2022-10-15 — End: 2022-12-10

## 2022-10-15 NOTE — Research (Addendum)
Shannon Kane met criteria for the Virtual care and social determinant interventions for the management of hypertension trial. The trial was discussed with her by Gillian Shields NP and Rutherford Guys including risk/benefits. She had the opportunity to read the consent and ask questions. The consent was signed and a copy was given to the patient. No study related procedures were performed prior to consent being signed.  Shannon Kane was randomized to Group 2: Advanced hypertension clinic.

## 2022-10-15 NOTE — Progress Notes (Signed)
Advanced Hypertension Clinic Initial Assessment:    Date:  10/18/2022   ID:  Shannon Kane, DOB Mar 08, 1971, MRN [id number]  PCP:  Dorothyann Peng, MD  Cardiologist:  None  Nephrologist:  Referring MD: Dorothyann Peng, MD   CC: Hypertension  History of Present Illness:    Shannon Kane is a 52 y.o. female with a hx of HTN, breast cancer, hypothyroidism, vitamin D deficiency, DM2 here to establish care in the Advanced Hypertension Clinic.   Prior echocardiogram 07/22/2021 normal LVEF 60 to 65%, no RWMA, grade 1 diastolic dysfunction, no significant valvular normalities.  Shannon Kane was diagnosed with hypertension in her 9s. 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 It has been difficult to control. Notes it was high even when wight was down to 174 lbs. Blood pressure checked with arm cuff at home. Readings have been 110s-140s/73-80s both before and after medications. No tobacco use. Alcohol use socially maybe only on a holiday. For exercise she participates in classes at the [facility] and walks regularly. she eats at home and does follow low sodium diet. Avoids caffeine.   Just took the Amlodipine in her appointment. Takes Levothyroxine prior to the gym in the morning. On her way home from the gym will take Amlodipine. Takes Valsartan at night.   Notes palpitations over the last week and the half. Reports no shortness of breath nor dyspnea on exertion. Reports no chest pain, pressure, or tightness. No edema, orthopnea, PND.   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 Antony Blackbird   Hypertension    Hyperthyroidism    in the past   Hypothyroidism    Iron deficiency anemia     Palpitation    Personal history of chemotherapy    Personal history of radiation therapy    Spontaneous ecchymoses    Tachycardia    Vitamin D deficiency     Past Surgical History:  Procedure Laterality Date   BREAST BIOPSY     BREAST EXCISIONAL BIOPSY     BREAST LUMPECTOMY Right 08/20/2020   w/ rad w/ chemo   BREAST LUMPECTOMY WITH RADIOACTIVE SEED LOCALIZATION Right 08/20/2020   Procedure: RADIOACTIVE SEED GUIDED RIGHT BREAST LUMPECTOMY;  Surgeon: Abigail Miyamoto, MD;  Location: [facility] OR;  Service: General;  Laterality: Right;   CHOLECYSTECTOMY     COLONOSCOPY  05/18/2018   KNEE SURGERY     PORT-A-CATH REMOVAL N/A 04/30/2021   Procedure: REMOVAL PORT-A-CATH;  Surgeon: Abigail Miyamoto, MD;  Location: [facility];  Service: General;  Laterality: N/A;   PORTACATH PLACEMENT Left 04/15/2020   Procedure: INSERTION PORT-A-CATH;  Surgeon: Abigail Miyamoto, MD;  Location: [facility];  Service: General;  Laterality: Left;   RADIOACTIVE SEED GUIDED AXILLARY SENTINEL LYMPH NODE Right 08/20/2020   Procedure: RADIOACTIVE SEED TARGETED RIGHT AXILLARY LYMPH NODE DISSECTION;  Surgeon: Abigail Miyamoto, MD;  Location: [facility] OR;  Service: General;  Laterality: Right;   TONSILLECTOMY      Current Medications: Current Meds  Medication Sig   anastrozole (ARIMIDEX) 1 MG tablet Take 1 tablet (1 mg total) by mouth daily.   Cholecalciferol (VITAMIN D-3 PO) Take 5,000 Units by mouth daily.   MAGNESIUM GLYCINATE  PO Take by mouth.   SYNTHROID 75 MCG tablet TAKE 1 TABLET DAILY BEFORE BREAKFAST   valsartan (DIOVAN) 160 MG tablet Take 1 tablet (160 mg total) by mouth daily.   vitamin B-12 (CYANOCOBALAMIN) 1000 MCG tablet Take 1,000 mcg by mouth daily.   [DISCONTINUED] amLODipine (NORVASC) 2.5 MG tablet Take 2.5 mg by mouth daily.     Allergies:   Amoxicillin, Ciprofloxacin, and Penicillins   Social History   Socioeconomic History   Marital status: Married    Spouse name:  Not on file   Number of children: Not on file   Years of education: Not on file   Highest education level: Not on file  Occupational History   Not on file  Tobacco Use   Smoking status: Never   Smokeless tobacco: Never  Vaping Use   Vaping Use: Never used  Substance and Sexual Activity   Alcohol use: Yes    Alcohol/week: 0.0 standard drinks of alcohol    Comment: social   Drug use: Never   Sexual activity: Not Currently    Birth control/protection: None  Other Topics Concern   Not on file  Social History Narrative   Not on file   Social Determinants of Health   Financial Resource Strain: Low Risk  (10/15/2022)   Overall Financial Resource Strain (CARDIA)    Difficulty of Paying Living Expenses: Not hard at all  Food Insecurity: No Food Insecurity (04/03/2020)   Hunger Vital Sign    Worried About Running Out of Food in the Last Year: Never true    Ran Out of Food in the Last Year: Never true  Transportation Needs: No Transportation Needs (04/03/2020)   PRAPARE - Administrator, Civil Service (Medical): No    Lack of Transportation (Non-Medical): No  Physical Activity: Sufficiently Active (10/15/2022)   Exercise Vital Sign    Days of Exercise per Week: 6 days    Minutes of Exercise per Session: 30 min  Stress: No Stress Concern Present (10/15/2022)   [employer] of Occupational Health - Occupational Stress Questionnaire    Feeling of Stress : Only a Michael  Social Connections: Socially Integrated (10/15/2022)   Social Connection and Isolation Panel [NHANES]    Frequency of Communication with Friends and Family: More than three times a week    Frequency of Social Gatherings with Friends and Family: Three times a week    Attends Religious Services: More than 4 times per year    Active Member of Clubs or Organizations: Yes    Attends Engineer, structural: More than 4 times per year    Marital Status: Married     Family History: The patient's  family history includes Bone cancer in her paternal grandfather; Breast cancer in an other family member; Cancer in her maternal uncle and maternal uncle; Diabetes in her mother; Hypertension in her mother; Leukemia in her father; Ovarian cancer (age of onset: 29) in her maternal grandmother.  ROS:   Please see the history of present illness.     All other systems reviewed and are negative.  EKGs/Labs/Other Studies Reviewed:    EKG:  EKG is not ordered today.   Recent Labs: 07/20/2022: ALT 14; BUN 9; Creatinine, Ser 0.83; Hemoglobin 13.7; Platelets 241; Potassium 4.2; Sodium 142; TSH 1.710   Recent Lipid Panel    Component Value Date/Time   CHOL 164 07/20/2022 1011   TRIG 72 07/20/2022 1011   HDL 49 07/20/2022 1011   CHOLHDL 3.3  07/20/2022 1011   CHOLHDL 4.3 04/20/2022 0805   VLDL 13 04/20/2022 0805   LDLCALC 101 (H) 07/20/2022 1011    Physical Exam:   VS:  BP (!) 145/100 Comment: left arm, home cuff  Pulse 84   Ht 5\' 8"  (1.727 m)   Wt 191 lb 4.8 oz (86.8 kg)   LMP 09/16/2019   BMI 29.09 kg/m  , BMI Body mass index is 29.09 kg/m. GENERAL:  Well appearing HEENT: Pupils equal round and reactive, fundi not visualized, oral mucosa unremarkable NECK:  No jugular venous distention, waveform within normal limits, carotid upstroke brisk and symmetric, no bruits, no thyromegaly LYMPHATICS:  No cervical adenopathy LUNGS:  Clear to auscultation bilaterally HEART:  RRR.  PMI not displaced or sustained,S1 and S2 within normal limits, no S3, no S4, no clicks, no rubs, no murmurs ABD:  Flat, positive bowel sounds normal in frequency in pitch, no bruits, no rebound, no guarding, no midline pulsatile mass, no hepatomegaly, no splenomegaly EXT:  2 plus pulses throughout, no edema, no cyanosis no clubbing SKIN:  No rashes no nodules NEURO:  Cranial nerves II through XII grossly intact, motor grossly intact throughout PSYCH:  Cognitively intact, oriented to person place and  time   ASSESSMENT/PLAN:    HTN - BP not at goal <130/80. Plan for secondayr hypertension workup. Hold Valsartan due to need for renin-aldosterone level. Increase Amlodipine to 5mg  QD. Future considerations include combination Amlodipine-Valsartan tablet w/ or w/o HCTZ. Enrolled in [employer] remote patient monitoring research study. Hold valsartan x 48 hours then plasma renin-aldosterone, metanephrines, catecholamines Renal duplex to rule out renal stenosis Carotid duplex due to asymmetrical BP Encouraged to continue excellent exercise regimen & heart healthy diet.   Hypothyroidism - Continue to follow with PCP.   DM2 -  01/12/22 A1c 6.5 ? 07/20/22 A1c 6.1. Managed with diet/exercise.   HLD, LDL goal <100 -07/20/22 total close 164, triglyceride 72, HDL 49, LDL 101.  Continue lifestyle changes. Recommend aiming for 150 minutes of moderate intensity activity per week and following a heart healthy diet.    Screening for Secondary Hypertension:     10/18/2022    1:41 PM  Causes  Drugs/Herbals Screened     - Comments No OTC, no excessive caffeine  Renovascular HTN Screened  Thyroid Disease Screened     - Comments hypothyroidism per PCP  Hyperaldosteronism Screened  Pheochromocytoma Screened  Coarctation of the Aorta Screened  Compliance Screened    Relevant Labs/Studies:    Latest Ref Rng & Units 07/20/2022   10:11 AM 04/20/2022    8:06 AM 01/12/2022    9:25 AM  Basic Labs  Sodium 134 - 144 mmol/L 142  141  143   Potassium 3.5 - 5.2 mmol/L 4.2  3.9  4.2   Creatinine 0.57 - 1.00 mg/dL 1.61  0.96  0.45        Latest Ref Rng & Units 07/20/2022   10:11 AM 01/12/2022    9:25 AM  Thyroid   TSH 0.450 - 4.500 uIU/mL 1.710  2.360                 10/15/2022    8:43 AM  Renovascular   Renal Artery [place] Completed Yes      she consents to be monitored in our remote patient monitoring program through [employer].  she will track his blood pressure twice daily and understands that these trends will  help [place] to adjust her medications as needed prior to his next  appointment.     Disposition:    FU with MD/APP/PharmD in 2 months    Medication Adjustments/Labs and Tests Ordered: Current medicines are reviewed at length with the patient today.  Concerns regarding medicines are outlined above.  Orders Placed This Encounter  Procedures   Metanephrines, plasma   Catecholamines, fractionated, plasma   Aldosterone + renin activity w/ ratio   Cantril's Ladder Assessment   VAS US RENAL ARTERY DUPLEX   VAS US CAROTID   Meds ordered this encounter  Medications   amLODipine (NORVASC) 2.5 MG tablet    Sig: Take 2 tablets (5 mg total) by mouth daily.    Dispense:  180 tablet    Refill:  3     Signed, Alver Sorrow, NP  10/18/2022 1:42 PM    [facility]

## 2022-10-15 NOTE — Patient Instructions (Addendum)
Medication Instructions:  Your physician has recommended you make the following change in your medication when you get back from vacation:  HOLD Valsartan  CHANGE Amlodipine to 5mg  daily Take two of your 2.5mg  tablets together  Labwork:  Please return for Lab work including metanephrines, catecholamines, renin-aldosterstone. You may come to the...   [facility] (3rd floor) [address] [place], [place] [address]  Open: 8am-Noon and 1pm-4:30pm  Please ring the doorbell on the small table when you exit the elevator and the Lab Tech will come get you  [facility] at [facility] [address], [place], [place] [address] Open: 8am-1pm, then 2pm-4:30pm   [facility]- Please see attached locations sheet stapled to your lab work with address and hours.     Testing/Procedures:  Your physician has requested that you have a renal artery duplex. During this test, an ultrasound is used to evaluate blood flow to the kidneys. Allow one hour for this exam. Do not eat after midnight the day before and avoid carbonated beverages. Take your medications as you usually do.   Your physician has requested that you have a carotid duplex. This test is an ultrasound of the carotid arteries in your neck. It looks at blood flow through these arteries that supply the brain with blood. Allow one hour for this exam. There are no restrictions or special instructions.  Follow-Up: 2 AND 4 MONTH FOLLOW UP AS SCHEDULED

## 2022-10-16 ENCOUNTER — Telehealth: Payer: Self-pay

## 2022-10-16 DIAGNOSIS — Z0000 Encounter for general adult medical examination without abnormal findings: Secondary | ICD-10-CM

## 2022-10-16 NOTE — Telephone Encounter (Signed)
[employer]. Patient requested that her morning prompt time be adjusted to 8am. Patient's morning prompt time has been adjusted. Patient was provided contact numbers. Patient wanted to know if she should start checking her bp now she is going out of town from June 1st to June 8th. Patient was advised to start to ensure device is working. Patient verbally expressed understanding and had no further questions.    [name], MS, ERHD, [facility]  Care Guide, Health & Wellness Coach [address] [place] [address] Telephone: [phone] Email: [email or web address]

## 2022-10-18 ENCOUNTER — Encounter (HOSPITAL_BASED_OUTPATIENT_CLINIC_OR_DEPARTMENT_OTHER): Payer: Self-pay | Admitting: Family

## 2022-10-27 ENCOUNTER — Other Ambulatory Visit: Payer: Self-pay | Admitting: Internal Medicine

## 2022-11-02 ENCOUNTER — Telehealth: Payer: Self-pay

## 2022-11-02 DIAGNOSIS — Z0000 Encounter for general adult medical examination without abnormal findings: Secondary | ICD-10-CM

## 2022-11-02 NOTE — Telephone Encounter (Signed)
Called patient per request regarding interest in health coaching. Patient wants to work on improving her eating habits to improve overall health. Patient is currently engaging in the recommended 150 minutes of exercise per week via exercise classes and walking. Patient is having to enter readings manually. Informed patient that tech support has been informed and that the previous text sent to her contains the information she would need to contact them if she were to miss their call. Patient asked about survey question regarding difficulty keeping bp within range. Informed patient of the 130/80 or lower bp goal so she will know how to accurately answer the question for the week. Patient has been scheduled for her initial health coaching session over the phone on 6/24 at 1pm. Mailed/emailed patient a copy of the Welcome Letter and Code of Ethics for her records.   [name], MS, ERHD, [facility]  Care Guide, Health & Wellness Coach [address] [place] [address] Telephone: [phone] Email: [email or web address]

## 2022-11-09 ENCOUNTER — Encounter: Payer: Self-pay | Admitting: Internal Medicine

## 2022-11-09 ENCOUNTER — Telehealth: Payer: Self-pay | Admitting: Licensed Clinical Social Worker

## 2022-11-09 ENCOUNTER — Ambulatory Visit

## 2022-11-09 NOTE — Telephone Encounter (Signed)
CSW contacted patient to inform that Health Coaching visit will be cancelled today and patient will be contacted later in the week to reschedule. Lasandra Beech, LCSW, CCSW-MCS [phone]

## 2022-11-10 ENCOUNTER — Telehealth: Payer: Self-pay

## 2022-11-10 DIAGNOSIS — Z0000 Encounter for general adult medical examination without abnormal findings: Secondary | ICD-10-CM

## 2022-11-10 NOTE — Telephone Encounter (Signed)
Called patient to reschedule health coaching session. Patient did not answer. Left voicemail for patient to return call to reschedule.    [name], MS, ERHD, [facility]  Care Guide, Health & Wellness Coach [address] [place] [address] Telephone: [phone] Email: [email or web address]

## 2022-11-12 LAB — CATECHOLAMINES, FRACTIONATED, PLASMA
Dopamine: <30 pg/mL (ref 0–48)
Epinephrine: <15 pg/mL (ref 0–62)
Norepinephrine: 528 pg/mL (ref 0–874)

## 2022-11-12 LAB — ALDOSTERONE + RENIN ACTIVITY W/ RATIO
Aldos/Renin Ratio: 32.2 — ABNORMAL HIGH (ref 0.0–30.0)
Aldosterone: 6.7 ng/dL (ref 0.0–30.0)
Renin Activity, Plasma: 0.208 ng/mL/hr (ref 0.167–5.380)

## 2022-11-12 LAB — METANEPHRINES, PLASMA

## 2022-11-13 ENCOUNTER — Telehealth: Payer: Self-pay

## 2022-11-13 DIAGNOSIS — Z0000 Encounter for general adult medical examination without abnormal findings: Secondary | ICD-10-CM

## 2022-11-13 NOTE — Telephone Encounter (Signed)
Called patient to reschedule health coaching appointment. Patient has been rescheduled for 7/1 at 2:00pm for a phone visit. Patient will be called at this time.   [name], MS, ERHD, [facility]  Care Guide, Health & Wellness Coach [address] [place] [address] Telephone: [phone] Email: [email or web address]

## 2022-11-16 ENCOUNTER — Ambulatory Visit

## 2022-11-16 DIAGNOSIS — Z0000 Encounter for general adult medical examination without abnormal findings: Secondary | ICD-10-CM

## 2022-11-16 NOTE — Progress Notes (Unsigned)
Appointment Outcome: Completed, Session #: Initial                         Start time: 2:00pm   End time: 3:00pm   Total Mins: 60 minutes  AGREEMENTS SECTION   Overall Goal(s): Reduce blood pressure to ?130/80 over the next three months Reduce A1C over the next three months   Agreement/Action Steps:  Monitor sodium and sugar intake by reading food labels Aim for 1500mg  of sodium per day Aim for 25 mg of sugar per day  Practice portion control per recommended serving size (or less) on food label Log food using Fitbit        Progress Notes:  Patient stated that her A1C has been up and down. Patient also mentioned that her blood pressure is a concern even with the systolic blood pressure has lowered, but the diastolic remains elevated. Patient expressed that she does not understand why she is having issues with her blood pressure fluctuating with the changes that she has already made. Discussed with patient the blood pressure goal of ?130/80 since she is a participant in [employer].   In addition to losing weight, patient is interested in working on lowering her A1C and reducing her blood pressure. Patient shared her workout routine at the gym at the [facility] and her afternoon walks that are 3.5-4 miles each time. Patient is exercising 5-6 days per week for more than 150 minutes. Patient is concerned that she may not be eating enough protein and questioned if she should eat before working out. Patient reported that she drinks approximately 128 oz of water per day.  Patient explained that she cut back on eating fast food and prefers to eat at home. Patient mentioned that she does not eat canned goods often. Patient shared that she is weaning herself off chicken and occasionally eats red meant. Patient expressed interest in consuming more of a plant-based diet, but need to figure out what she can eat on a consistent basis. Patient shared that she currently consumes a lot of vegetables and plant protein.    Patient is concerned if she is eating enough. Patient mentioned that she is logging her meals using her Fitbit. Patient stated that she practices intermittent fasting and eats between 12pm-7pm. Patient mentioned that she does not cook with salt. Patient shared that for breakfast she may have an egg sandwich that consists of two eggs seasoned with turmeric, black pepper, and garlic powder. Patient shared an example of what she may eat for lunch that includes packaged salads or red kidney beans.   Patient had questions about eating legumes, processed foods, and soy per a doctor that she follows on social media. Shared with patient that providing nutritional advice was out of my scope of practice and that it would be best to discuss those concerns with a dietician based on her conditions. Patient also expressed concerned if any of her medications is contributing to her elevated blood pressure.   Patient shared that she has cravings for something sweet after eating and mainly at night. Patient stated that if she does not have sweets at home she can manage her cravings, but if it is readily available or offered then she will eat it. Discussed with patient ways that she can cut down on sugar and was made aware of the recommended daily sugar intake for women of 25 mg. Patient stated that she is currently eating cherries, apples, peaches, blueberries, and blackberries. Patient  shared that the cherries is her go to snack.   Patient mentioned that eating fruit now is not a concern, but it's challenging when certain fruit are not in season. Patient shared that sometimes she makes her own sweets such as ginger snaps or rum cake. Patient stated that in some recipes she can substitute sugar containing ingredients with something that has less sugar so she can control her sugar intake. Patient mentioned that some recipes she does not make sugar substitutions.  Discussed reading food labels with patient to determine the  recommended serving size to help monitor sodium and sugar intake. Patient read a food label of a sugar containing product that contained sugar and sugar alcohol. Patient shared how she practices portion control with this sweet treat and stated that she believes she was consuming 1.5 servings due to the container that she was using. Patient learned that one serving of this treat was 2/3 cup.   Patient stated that after our first conversation she started reading food labels to monitor her sodium intake. Patient was made aware of the recommended target sodium intake of 1500mg  of sodium per day for individuals with hypertension. Patient stated that she recognized that one of the sausage links that she eats contains 530mg  of sodium, so she started eating half of a link at a time. Patient shared that if she notices that one of her meals was higher in sodium, she will adjust the next meal.    Indicators of Success and Accountability:  Patient co-created action steps that she will implement over the next two weeks to start monitoring her sodium and sugar content by reading food labels, practicing portion control based on recommended serving size, and tracking her meals via Fitbit.   Readiness: Patient is in the preparation stage of improving blood pressure and A1C.  Strengths and Supports: Patient is curious and resourceful.   Challenges and Barriers: Patient shared that she was diagnosed with hypothyroidism, which may be a challenge to losing weight   Coaching Outcomes: Patient will implement the action steps outlined above over the next two weeks.   Reviewed Coaching Agreement and Code of Ethics with Patient during initial session. Provided patient with a hard/electronic copy of the Coaching Agreement and Code of Ethics. Answered any questions the patient had if any regarding the Coaching Agreement and Code of Ethics. Patient verbally agreed to adhere to the Coaching Agreement and to abide by the Code  of Ethics. Patient was emailed an outlined of her action steps.

## 2022-11-23 ENCOUNTER — Ambulatory Visit (INDEPENDENT_AMBULATORY_CARE_PROVIDER_SITE_OTHER)

## 2022-11-23 DIAGNOSIS — I10 Essential (primary) hypertension: Secondary | ICD-10-CM | POA: Diagnosis not present

## 2022-11-30 ENCOUNTER — Ambulatory Visit

## 2022-11-30 DIAGNOSIS — Z0000 Encounter for general adult medical examination without abnormal findings: Secondary | ICD-10-CM

## 2022-11-30 NOTE — Progress Notes (Signed)
Patient is having to enter readings manually in [employer]. Submitted a ticket to tech support for troubleshooting. Patient made aware via [employer] message and was provided her ticket # [id number] and contat information to [employer].   [name], MS, ERHD, [facility]  Care Guide, Health & Wellness Coach [address] [place] [address] Telephone: [phone] Email: [email or web address]

## 2022-11-30 NOTE — Progress Notes (Unsigned)
 Appointment Outcome: Completed, Session #: 31-month f/u                       Start time:  11:00am   End time: 11:33am   Total Mins: 33 minutes  AGREEMENTS SECTION   Overall Goal(s): Smoking cessation Increase physical activity Improve eating habits   Agreement/Action Steps: Smoking Cessation Maintain not smoking after finished last 7mg  nicotine patch Utilize support system Use Quit Tracker app   Increase physical activity Exercise on Tues and Thurs for 15-30 mins Aim for a minimum of 8,000 steps during his work and 4,000 steps on off days   Improving eating habits Swap sweet snacks after meals with fruit (apples, grapes, oranges, or tangelos) Research healthy breakfast meals Increase protein intake on exercise days (Tues and Thurs) Drink overnight oats with 20g of protein per serving    Progress Notes:  Patient reported that he has been smoke free for 144 days per his quit tracker app and finished his nicotine patches one month ago. Patient stated that he has been doing well and do not think about smoking unless he is drinking his morning coffee. Patient stated that once the urge to smoke begins, he begins watching tv to distract himself. Patient mentioned that he has not dreamed of himself smoking since the last dream he had.  Patient shared that it does not trigger him to want to smoke when he sees others smoking. Patient mentioned that he does not get an urge to smoke even when he starts feeling upset. Patient expressed that smoking does not cross his mind as it would have previously to help him calm down. Patient stated that his support system has been helpful in encouraging him through the process of smoking cessation.   Patient expressed that he is drinking more water and eating in place of smoking. Patient shared that he continues to take his healthy choice meals to work in addition to fruits such as watermelon, raspberries, strawberries, and bananas. Patient stated there are  times that he still has a challenge with eating sweets. Patient mentioned that one day he ate fruit and apple pie during lunch. Patient expressed that he should not have eaten the pie.   Patient stated that he consumes the overnight oats every other day although he has not been following his exercise schedule. Patient stated that his weight has increased from 170 lbs. to 189 lbs. and feels that it is time that he loses weight. Patient shared that his goal weight is 175 lbs. Patient expressed that he believes it's the types of foods that he has been consuming and not the quantity of food that has led to the weight gain. Patient mentioned that he typically gets breakfast (chicken sandwich with no fries or soda) from [employer] at least twice a week on the way to work but feels that he needs to get out of the routine of eating fast food. Patient stated that when he does eat breakfast at home he enjoys grits, oatmeal, and waffles.  Discussed with patient regarding balanced meals. Discussed with patient about reading food labels to monitor his sodium intake since he eats prepackaged meals regularly and to aid in choosing lower sugar containing foods. Discussed with the idea of practicing portion control with sweets by reading food labels to determine the recommended serving size. Patient stated that he wasn't sure how to reduce his sugar consumption but following the recommended serving size was something that he could start doing.  Patient reported his daily steps while at work and on his off days. Patient has been averaging between 8,600 and 12,000 steps while at work depending on how busy he becomes. Patient is averaging around 5,000 steps on his off days by walking in stores, engaging in housework, or helping his sister with things at her house. Patient stated that he has been stretching every other day before work in place of exercising over the past month.   Patient mentioned that he purchased a treadmill  because he enjoys walking to help with losing weight in addition to a pushup board. Patient stated that he currently completes 30 pushups daily before work. Patient stated that he has a room that he is working on turning into a workout space that will include his weight bench in addition to the other equipment he has purchased. Discussed with patient an ideal workout schedule that he would be able to implement and maintain.   Indicators of Success and Accountability:  Patient has been able to maintain not smoking after completing his prescription of nicotine patches a month ago.  Readiness: Patient is in the action stage of smoking cessation, increasing physical activity, and improving healthy eating habits.  Strengths and Supports: Patient is being supported by his family. Patient is relying on being adaptable and persistent.   Challenges and Barriers: Patient does not foresee any challenges to maintain smoking cessation over the next two months or implementing action steps to increase physical activity and improve healthy eating habits.    Coaching Outcomes: Patient will implement the following strategies over the next two months as outlined below based on the plans that the patient has begun or want to start implementing.    Overall Goal(s): Smoking cessation Increase physical activity Improve eating habits   Agreement/Action Steps: Smoking Cessation Maintain not smoking Utilize support system Use Quit Tracker app   Increase physical activity Exercise for 1 hour after work 2-3 days per week Work out from 12:45-1:45am on T and Th (and Wed if possible) Set up home gym with treadmill, weight bench, and pushup board Aim for a minimum of 8,000 steps during his work and 4,000 steps on off days   Improving eating habits Reduce the amount of sugar consumed daily Swap sweet snacks after meals with fruit Read food labels to eat the recommended serving size Increase protein intake on  exercise days (Tues and Thurs) Drink overnight oats with 20g of protein per serving    Attempted: Fulfilled - Patient was able to maintain not smoking after completing the prescription for nicotine patches over the past month by continuing to utilize his support system and quit tracker app. Patient continues to consume overnight oats to increase his protein intake.  Partial - Patient has been able to reach his step goal of 8,000 steps during workdays and 4,000 steps on off days by engaging in more walking and other physical activities.  Not met - Patient had a challenge with swapping out sweet snacks after meals with fruit.    Not attempted: Revised - Patient determined that he would like to change his exercise routine to lose weight by working out after work for 1 hour between 12:45-1:45am for 2-3 days/week with exercise equipment.

## 2022-12-10 ENCOUNTER — Encounter (HOSPITAL_BASED_OUTPATIENT_CLINIC_OR_DEPARTMENT_OTHER): Payer: Self-pay | Admitting: Family

## 2022-12-10 ENCOUNTER — Telehealth (INDEPENDENT_AMBULATORY_CARE_PROVIDER_SITE_OTHER): Admitting: Family

## 2022-12-10 VITALS — BP 115/75 | HR 82 | Ht 68.0 in | Wt 190.0 lb

## 2022-12-10 DIAGNOSIS — E782 Mixed hyperlipidemia: Secondary | ICD-10-CM | POA: Diagnosis not present

## 2022-12-10 DIAGNOSIS — I10 Essential (primary) hypertension: Secondary | ICD-10-CM | POA: Diagnosis not present

## 2022-12-10 MED ORDER — VALSARTAN 80 MG PO TABS: 80.0000 mg | ORAL_TABLET | Freq: Every day | ORAL | Status: DC

## 2022-12-10 MED ORDER — AMLODIPINE BESYLATE 2.5 MG PO TABS
2.5000 mg | ORAL_TABLET | Freq: Every day | ORAL | 3 refills | Status: DC
Start: 2022-12-10 — End: 2023-04-19

## 2022-12-10 NOTE — Progress Notes (Signed)
Virtual Visit via Video Note   Because of Shannon Kane's co-morbid illnesses, she is at least at moderate risk for complications without adequate follow up.  This format is felt to be most appropriate for this patient at this time.  All issues noted in this document were discussed and addressed.  A limited physical exam was performed with this format.  Please refer to the patient's chart for her consent to telehealth for Shannon Kane.  Date:  12/10/2022   ID:  Shannon Kane, DOB [birth year], MRN [id number] The patient was identified using 2 identifiers.  Patient Location: Home Provider Location: Office/Clinic   PCP:  Shannon Peng, MD   [facility] Providers Cardiologist:  Shannon Si, MD     Evaluation Performed:  Follow-Up Visit  Chief Complaint:  HTN  History of Present Illness:    Shannon Kane is a 52 y.o. female with a hx of HTN, breast cancer, hypothyroidism, vitamin D deficiency, DM2 here to [facility] in [facility].    Prior echocardiogram 07/22/2021 normal LVEF 60 to 65%, no RWMA, grade 1 diastolic dysfunction, no significant valvular normalities. Established with [facility] 10/15/22. Shannon Kane was diagnosed with hypertension in her 10s. 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 Readings at home were 110s-140s/73-80s both before and after medications. She was exercising the [facility] and walks regularly, avoiding caffeine, and adhering to low sodium diet. At initial visit Amlodipine was increased, Valsartan continued. Renin-aldosterone, metanephrines, catecholamines were unremarkable. Renal and carotid duplex 11/2022 without stenosis.   She presents today for follow up via video visit. She has continued taking lower dose Amlodipine 2.5mg  and Valsartan. BP via Vivify RPM over the last week with range 103/53-141/98 with average 121/82. Continues to exercise regularly, recently resumed softball league, and follow  heart healthy diet. Her goal is to be on no blood pressure medications. She is concerned Anastrazole may be contributory to hypertension, reviewed 5-13% instance of hypertension as listed side effect. Discussed secondary workup unremarkable and as diagnosed with hypertension in her 13s (much prior to Anastrazole) may require antihypertensive medication. She is concerned about Valsartan side effects after reading, reviewed renal protective benefit.    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 Antony Blackbird   Hypertension    Hyperthyroidism    in the past   Hypothyroidism    Iron deficiency anemia    Palpitation    Personal history of chemotherapy    Personal history of radiation therapy    Spontaneous ecchymoses    Tachycardia    Vitamin D deficiency    Past Surgical History:  Procedure Laterality Date   BREAST BIOPSY     BREAST EXCISIONAL BIOPSY     BREAST LUMPECTOMY Right 08/20/2020   w/ rad w/ chemo   BREAST LUMPECTOMY WITH RADIOACTIVE SEED LOCALIZATION Right 08/20/2020   Procedure: RADIOACTIVE SEED GUIDED RIGHT BREAST LUMPECTOMY;  Surgeon: Shannon Miyamoto, MD;  Location: [facility] OR;  Service: General;  Laterality: Right;   CHOLECYSTECTOMY     COLONOSCOPY  05/18/2018   KNEE SURGERY     PORT-A-CATH REMOVAL N/A 04/30/2021   Procedure: REMOVAL PORT-A-CATH;  Surgeon: Shannon Miyamoto, MD;  Location: [facility];  Service: General;  Laterality: N/A;   PORTACATH PLACEMENT Left 04/15/2020   Procedure: INSERTION PORT-A-CATH;  Surgeon: Shannon Miyamoto, MD;  Location: [facility];  Service: General;  Laterality: Left;   RADIOACTIVE SEED GUIDED AXILLARY SENTINEL  LYMPH NODE Right 08/20/2020   Procedure: RADIOACTIVE SEED TARGETED RIGHT AXILLARY LYMPH NODE DISSECTION;  Surgeon: Shannon Miyamoto, MD;  Location: [facility] OR;  Service: General;  Laterality: Right;   TONSILLECTOMY       Current Meds  Medication Sig   anastrozole (ARIMIDEX) 1 MG tablet Take 1 tablet (1 mg total) by mouth daily.   Cholecalciferol (VITAMIN D-3 PO) Take 5,000 Units by mouth daily.   MAGNESIUM GLYCINATE PO Take by mouth.   SYNTHROID 75 MCG tablet TAKE 1 TABLET DAILY BEFORE BREAKFAST   vitamin B-12 (CYANOCOBALAMIN) 1000 MCG tablet Take 1,000 mcg by mouth daily.   [DISCONTINUED] amLODipine (NORVASC) 2.5 MG tablet Take 2 tablets (5 mg total) by mouth daily.   [DISCONTINUED] valsartan (DIOVAN) 160 MG tablet Take 1 tablet (160 mg total) by mouth daily.     Allergies:   Amoxicillin, Ciprofloxacin, and Penicillins   Social History   Tobacco Use   Smoking status: Never   Smokeless tobacco: Never  Vaping Use   Vaping status: Never Used  Substance Use Topics   Alcohol use: Yes    Alcohol/week: 0.0 standard drinks of alcohol    Comment: social   Drug use: Never     Family Hx: The patient's family history includes Bone cancer in her paternal grandfather; Breast cancer in an other family member; Cancer in her maternal uncle and maternal uncle; Diabetes in her mother; Hypertension in her mother; Leukemia in her father; Ovarian cancer (age of onset: 4) in her maternal grandmother.  ROS:   Please see the history of present illness.     All other systems reviewed and are negative.   Prior CV studies:   The following studies were reviewed today:  Cardiac Studies & Procedures       ECHOCARDIOGRAM  ECHOCARDIOGRAM COMPLETE 07/22/2021  Narrative ECHOCARDIOGRAM REPORT    Patient Name:   Shannon Kane Date of Exam: 07/22/2021 Medical Rec #:  [id number]   Height:       68.2 in Accession #:    [id number]  Weight:       195.4 lb Date of Birth:  [birth year]   BSA:          2.028  m Patient Age:    50 years    BP:           132/84 mmHg Patient Gender: F           HR:           82 bpm. Exam Location:  Outpatient  Procedure: 2D Echo, Cardiac Doppler, Color Doppler and Strain Analysis  Indications:    CHEMO  History:        Patient has prior history of Echocardiogram examinations, most recent 02/04/2021. BREAST CA.  Sonographer:    Festus Barren Referring Phys: Merula.Booze LINDSEY CORNETTO CAUSEY   Sonographer Comments: No subcostal window. IMPRESSIONS   1. Left ventricular ejection fraction, by estimation, is 60 to 65%. Left ventricular ejection fraction by 2D MOD biplane is 62.0 %. The left ventricle has normal function. The left ventricle has no regional wall motion abnormalities. Left ventricular diastolic parameters are consistent with Grade I diastolic dysfunction (impaired relaxation). 2. Right ventricular systolic function is normal. The right  ventricular size is normal. 3. The mitral valve is grossly normal. No evidence of mitral valve regurgitation. 4. The aortic valve is tricuspid. Aortic valve regurgitation is not visualized.  Comparison(s): No significant change from prior study. 02/04/2021: LVEF 60-65%.  FINDINGS Left Ventricle: Left ventricular ejection fraction, by estimation, is 60 to 65%. Left ventricular ejection fraction by 2D MOD biplane is 62.0 %. The left ventricle has normal function. The left ventricle has no regional wall motion abnormalities. The left ventricular internal cavity size was normal in size. There is no left ventricular hypertrophy. Left ventricular diastolic parameters are consistent with Grade I diastolic dysfunction (impaired relaxation). Indeterminate filling pressures.  Right Ventricle: The right ventricular size is normal. No increase in right ventricular wall thickness. Right ventricular systolic function is normal.  Left Atrium: Left atrial size was normal in size.  Right Atrium: Right atrial size was normal in  size.  Pericardium: There is no evidence of pericardial effusion.  Mitral Valve: The mitral valve is grossly normal. No evidence of mitral valve regurgitation.  Tricuspid Valve: The tricuspid valve is not well visualized. Tricuspid valve regurgitation is not demonstrated.  Aortic Valve: The aortic valve is tricuspid. Aortic valve regurgitation is not visualized. Aortic valve mean gradient measures 3.0 mmHg. Aortic valve peak gradient measures 6.2 mmHg. Aortic valve area, by VTI measures 2.28 cm.  Pulmonic Valve: The pulmonic valve was normal in structure. Pulmonic valve regurgitation is not visualized.  Aorta: The aortic root and ascending aorta are structurally normal, with no evidence of dilitation.  IAS/Shunts: No atrial level shunt detected by color flow Doppler.   LEFT VENTRICLE PLAX 2D                        Biplane EF (MOD) LVIDd:         3.20 cm         LV Biplane EF:   Left LVIDs:         2.20 cm                          ventricular LV PW:         1.00 cm                          ejection LV IVS:        1.10 cm                          fraction by LVOT diam:     1.90 cm                          2D MOD LV SV:         49                               biplane is LV SV Index:   24                               62.0 %. LVOT Area:     2.84 cm Diastology LV e' medial:    5.44 cm/s LV Volumes (MOD)               LV E/e' medial:  11.3 LV  vol d, MOD    68.9 ml       LV e' lateral:   7.62 cm/s A2C:                           LV E/e' lateral: 8.1 LV vol d, MOD    52.7 ml A4C: LV vol s, MOD    26.2 ml A2C: LV vol s, MOD    19.7 ml A4C: LV SV MOD A2C:   42.7 ml LV SV MOD A4C:   52.7 ml LV SV MOD BP:    38.1 ml  RIGHT VENTRICLE RV S prime:     14.30 cm/s TAPSE (M-mode): 1.7 cm  LEFT ATRIUM           Index        RIGHT ATRIUM           Index LA diam:      3.70 cm 1.82 cm/m   RA Area:     12.20 cm LA Vol (A2C): 27.3 ml 13.46 ml/m  RA Volume:   25.00 ml  12.33 ml/m LA Vol  (A4C): 36.0 ml 17.75 ml/m AORTIC VALVE                    PULMONIC VALVE AV Area (Vmax):    2.02 cm     PV Vmax:       0.75 m/s AV Area (Vmean):   1.99 cm     PV Vmean:      51.000 cm/s AV Area (VTI):     2.28 cm     PV VTI:        0.141 m AV Vmax:           124.00 cm/s  PV Peak grad:  2.2 mmHg AV Vmean:          85.500 cm/s  PV Mean grad:  1.0 mmHg AV VTI:            0.215 m AV Peak Grad:      6.2 mmHg AV Mean Grad:      3.0 mmHg LVOT Vmax:         88.30 cm/s LVOT Vmean:        60.100 cm/s LVOT VTI:          0.173 m LVOT/AV VTI ratio: 0.80  AORTA Ao Root diam: 2.30 cm Ao Asc diam:  2.60 cm  MITRAL VALVE MV Area (PHT): 3.21 cm    SHUNTS MV Decel Time: 236 msec    Systemic VTI:  0.17 m MV E velocity: 61.60 cm/s  Systemic Diam: 1.90 cm MV A velocity: 61.60 cm/s MV E/A ratio:  1.00  Zoila Shutter MD Electronically signed by Zoila Shutter MD Signature Date/Time: 07/22/2021/4:35:36 PM    Final              Labs/Other Tests and Data Reviewed:    EKG:  No ECG reviewed.  Recent Labs: 07/20/2022: ALT 14; BUN 9; Creatinine, Ser 0.83; Hemoglobin 13.7; Platelets 241; Potassium 4.2; Sodium 142; TSH 1.710   Recent Lipid Panel Lab Results  Component Value Date/Time   CHOL 164 07/20/2022 10:11 AM   TRIG 72 07/20/2022 10:11 AM   HDL 49 07/20/2022 10:11 AM   CHOLHDL 3.3 07/20/2022 10:11 AM   CHOLHDL 4.3 04/20/2022 08:05 AM   LDLCALC 101 (H) 07/20/2022 10:11 AM    Wt Readings from Last 3 Encounters:  12/10/22 190 lb (86.2 kg)  10/15/22 191 lb 4.8 oz (86.8 kg)  08/24/22 191 lb 11.2 oz (87 kg)     Risk Assessment/Calculations:          Objective:    Vital Signs:  BP 115/75   Pulse 82   Ht 5\' 8"  (1.727 m)   Wt 190 lb (86.2 kg)   LMP 09/16/2019   BMI 28.89 kg/m    VITAL SIGNS:  reviewed GEN:  no acute distress RESPIRATORY:  normal respiratory effort, symmetric expansion CARDIOVASCULAR:  no peripheral edema  ASSESSMENT & PLAN:    HTN - BP at goal <130/80.  Average reading in Vivify RPM 121/82. She would ideally like to be on no medications. Discussed this may not be possible but can work to consolidate. She is concerned about long term effects of Valsartan, we reviewed the renal protective benefit. After discussion, she wishes to continue Amlodipine 2.5mg  daily and reduce valsartan to half tablet (80 mg) daily.  Check in via MyChart in 1-2 weeks. If BP continues to be at goal, may trial further reducing Valsartan and increasing Amlodipine if needed to see if can achieve control on Amlodipine alone.  Secondary workup thus far unremarkable, renin-aldosterone not consistent with hyperaldosteronism, metanephrines/catecholamines normal, renal and carotid duplex with no stenosis.   Hypothyroidism - Continue to follow with PCP.    DM2 -  01/12/22 A1c 6.5 ? 07/20/22 A1c 6.1. Managed with diet/exercise.    HLD, LDL goal <100 -07/20/22 total close 164, triglyceride 72, HDL 49, LDL 101.  Continue lifestyle changes. Recommend aiming for 150 minutes of moderate intensity activity per week and following a heart healthy diet.     Time:   Today, I have spent 16 minutes with the patient with telehealth technology discussing the above problems.     Medication Adjustments/Labs and Tests Ordered: Current medicines are reviewed at length with the patient today.  Concerns regarding medicines are outlined above.   Tests Ordered: No orders of the defined types were placed in this encounter.   Medication Changes: Meds ordered this encounter  Medications   amLODipine (NORVASC) 2.5 MG tablet    Sig: Take 1 tablet (2.5 mg total) by mouth daily.    Dispense:  90 tablet    Refill:  3   valsartan (DIOVAN) 80 MG tablet    Sig: Take 1 tablet (80 mg total) by mouth daily.    Follow Up:  In Person in 2 month(s)  Signed, Alver Sorrow, NP  12/10/2022 8:27 AM    [facility]

## 2022-12-10 NOTE — Patient Instructions (Addendum)
Medication Instructions:  Your physician has recommended you make the following change in your medication:   Decrease: Valsartan to half tablet   Follow-Up: Follow up as scheduled

## 2022-12-14 ENCOUNTER — Ambulatory Visit: Attending: Cardiology

## 2022-12-14 DIAGNOSIS — Z0000 Encounter for general adult medical examination without abnormal findings: Secondary | ICD-10-CM

## 2022-12-14 NOTE — Progress Notes (Signed)
Appointment Outcome: Completed, Session #: 2                       Start time: 11:00am   End time: 11:30am   Total Mins: 30 minutes  AGREEMENTS SECTION   Overall Goal(s): Reduce blood pressure to ?130/80 over the next three months Reduce A1C over the next three months     Agreement/Action Steps:  Monitor sodium and sugar intake by reading food labels Aim for 1500mg  of sodium per day Aim for 25 mg of sugar per day  Practice portion control per recommended serving size (or less) on food label Log food using Fitbit    Progress Notes:  Patient stated that reading food labels has been an eye-opener for her when it comes to practicing portion control. Patient shared an example of how she is measuring out her foods to reflect the recommended portion size and has been intrigued by what that looks like. Patient stated that she is not depriving herself of the foods that she enjoys but is eating less of it and is finding that she is satisfied with those portions.   Patient shared that she continues to log her meal using the Fitbit app. Patient stated that she has been able to track the increase in her protein, carb, and fat intake, in addition to monitoring her sodium and sugar intake. Patient reported that she has been on target with meeting her goals for the daily sugar and sodium intake. Patient shared that when she is out of town, she continues to make the healthier choices based on foods that are available while staying at a hotel to attend work conferences. Patient stated that she eats smaller portions of the foods available to minimize consuming a lot of sodium.   Patient mentioned that she has made her own snacks that includes peanut butter, almonds, and dark chocolate. Patient shared that incorporating 85% dark chocolate has helped her cut down on her sugar intake. Patient continues to read food labels when she is away from home and log her food. Patient stated that when she eats outside of  her home, she makes sure that she practices portion control by not eating her entire meal at once and drinking 2-3 glasses of water.  Patient has also incorporated protein powder to increase her protein intake since she is lifting weights. Patient stated that she has noticed a difference in the reduction in cravings for carbs and sugar since increasing her protein. Patient reported that during her last visit with [facility], she was able to reduce one of her hypertension medications to half the dose. Patient stated that was the result of stabilizing her blood pressure. Patient will continue to monitor her blood pressure with Vivify to determine the next steps regarding her medication regimen.   Indicators of Success and Accountability:  Patient has been able to reduce one of her medications to half the original dose within the past two weeks.   Readiness: Patient is in the action stage of reducing her blood pressure and A1C by improving her eating habits to monitor her sodium and sugar intake.   Strengths and Supports: Patient is being supported by her husband. Patient is relying on being strategic and consistent.  Challenges and Barriers: Patient's challenge is to maintain the implementation of her action steps to monitor sodium while she is away from home.   Coaching Outcomes: Patient is interested in finding recipes to introduce more plant-based meals in her diet. Patient  Patient will continue to implement her action steps as outlined above over the next two weeks without any revisions.     Attempted: Fulfilled - Patient completed the bi-weekly agreement in full and was able to meet the challenge.

## 2022-12-18 ENCOUNTER — Encounter (HOSPITAL_BASED_OUTPATIENT_CLINIC_OR_DEPARTMENT_OTHER): Payer: Self-pay

## 2022-12-28 ENCOUNTER — Telehealth: Payer: Self-pay

## 2022-12-28 ENCOUNTER — Ambulatory Visit: Attending: Cardiology

## 2022-12-28 ENCOUNTER — Ambulatory Visit

## 2022-12-28 DIAGNOSIS — Z0000 Encounter for general adult medical examination without abnormal findings: Secondary | ICD-10-CM

## 2022-12-28 NOTE — Progress Notes (Unsigned)
Appointment Outcome: Completed, Session #: 3                         Start time: 4:00pm   End time: 4:30pm  Total Mins: 30 minutes  AGREEMENTS SECTION   Overall Goal(s): Reduce blood pressure to ?130/80 over the next three months Reduce A1C over the next three months     Agreement/Action Steps:  Monitor sodium and sugar intake by reading food labels Aim for 1500mg  of sodium per day Aim for 25 mg of sugar per day  Practice portion control per recommended serving size (or less) on food label Log food using Fitbit    Progress Notes:  Patient reported that she has continued to read food labels to monitor her sodium intake along with logging her meals using Fitbit. Patient stated that she has continued to consume less than 1500mg  of sodium per day when she is able to accurately capture her meals. Patient mentioned that her challenge with logging her meals is when she has cooked a meal at home using fresh ingredients (vegetables) she is not sure how much sodium she is consuming in a particular serving. Patient provided an example of making a [place] cake with onions and peppers.   Patient mentioned that although tracking the sodium in every meal is a challenge, it has helped her to be more mindful of what she is eating and how much of it she consumes. Patient expressed that this approach helps her practice portion control. Patient shared an experience with eating out at a restaurant and logging that meal, which was an eye opener for her.   Patient mentioned how she was shocked at how much sodium and sugar she had consumed during one meal with a desert. Patient stated that she recognized how she consumed her allotment of sodium and sugar in one sitting. Patient expressed that she had not eaten prior to that meal and thought about if she had eaten earlier, how much sodium and sugar she would have consumed by not cooking at home.   Patient stated that she was concerned that on average, she  does not consume enough sodium daily because of the amount and type of exercises that she does. Patient mentioned there are times that she has leg cramps at the gym before she starts exercising. Patient discussed how her blood pressure has decreased over time while using the remote Vivify monitoring system.   Patient shared that she is considered prediabetic and is working to lower her A1C more since her recent labs in March. Patient mentioned that she is also concerned about the trigger finger she is experiencing on her left hand (middle finger). Patient is curious to learn if it is related to protein intake, medication, etc.    Indicators of Success and Accountability: Patient's blood pressure has been within target according to Vivify remote monitoring.  Readiness: Patient is in the action stage of reducing her blood pressure to  ?130/80 and reducing her A1C over the next three months.  Strengths and Supports: Patient is being supported by her family. Patient is relying on an increase in self-awareness around her eating behaviors.   Challenges and Barriers: Patient is concerned if she is consuming enough sodium due to her activity level and experiencing leg cramps.    Coaching Outcomes: Informed patient that the recommendation for someone with hypertension is to consume around 1500mg  of sodium per day. Recommended patient to discuss concerns regarding sodium intake  and physical activity level with PCP.  Recommended to patient as well to discuss with PCP a potential cause of her experiencing a trigger finger on her left hand (middle finger).  Patient did not revise her action steps to implement as outlined above over the next two weeks.    Attempted: Fulfilled - Patient completed the bi-weekly agreement in full and was able to meet the challenge.

## 2022-12-28 NOTE — Telephone Encounter (Signed)
Called patient to hold health coaching session over the phone. Patient was in a work meeting and requested to be rescheduled for 4pm today. Patient's appointment has been rescheduled as requested and will be called at that time.    [name], MS, ERHD, [facility]  Care Guide, Health & Wellness Coach [address], [address] [place] [address] Telephone: [phone] Email: [email or web address]

## 2023-01-04 ENCOUNTER — Ambulatory Visit: Attending: Hematology and Oncology

## 2023-01-04 VITALS — Wt 190.5 lb

## 2023-01-04 DIAGNOSIS — Z483 Aftercare following surgery for neoplasm: Secondary | ICD-10-CM | POA: Insufficient documentation

## 2023-01-04 NOTE — Therapy (Signed)
OUTPATIENT PHYSICAL THERAPY SOZO SCREENING NOTE   Patient Name: Shannon Kane MRN: [id number] DOB:1971/02/22, 52 y.o., female Today's Date: 01/04/2023  PCP: Dorothyann Peng, MD REFERRING PROVIDER: Serena Croissant, MD   PT End of Session - 01/04/23 [phone]     Visit Number 9   # unchanged due to screen only   PT Start Time 0807    PT Stop Time 0811    PT Time Calculation (min) 4 min    Activity Tolerance Patient tolerated treatment well    Behavior During Therapy [facility] for tasks assessed/performed             Past Medical History:  Diagnosis Date   Abnormal glucose    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 Antony Blackbird   Hypertension    Hyperthyroidism    in the past   Hypothyroidism    Iron deficiency anemia    Palpitation    Personal history of chemotherapy    Personal history of radiation therapy    Spontaneous ecchymoses    Tachycardia    Vitamin D deficiency    Past Surgical History:  Procedure Laterality Date   BREAST BIOPSY     BREAST EXCISIONAL BIOPSY     BREAST LUMPECTOMY Right 08/20/2020   w/ rad w/ chemo   BREAST LUMPECTOMY WITH RADIOACTIVE SEED LOCALIZATION Right 08/20/2020   Procedure: RADIOACTIVE SEED GUIDED RIGHT BREAST LUMPECTOMY;  Surgeon: Abigail Miyamoto, MD;  Location: [facility] OR;  Service: General;  Laterality: Right;   CHOLECYSTECTOMY     COLONOSCOPY  05/18/2018   KNEE SURGERY     PORT-A-CATH REMOVAL N/A 04/30/2021   Procedure: REMOVAL PORT-A-CATH;  Surgeon: Abigail Miyamoto, MD;  Location: [facility];  Service: General;  Laterality: N/A;   PORTACATH PLACEMENT Left 04/15/2020   Procedure: INSERTION PORT-A-CATH;  Surgeon: Abigail Miyamoto, MD;  Location: [facility];  Service: General;  Laterality: Left;   RADIOACTIVE SEED GUIDED AXILLARY SENTINEL LYMPH  NODE Right 08/20/2020   Procedure: RADIOACTIVE SEED TARGETED RIGHT AXILLARY LYMPH NODE DISSECTION;  Surgeon: Abigail Miyamoto, MD;  Location: [facility] OR;  Service: General;  Laterality: Right;   TONSILLECTOMY     Patient Active Problem List   Diagnosis Date Noted   Constipation 07/15/2021   Chronic idiopathic constipation 07/15/2021   Dysphagia 07/15/2021   Gastroesophageal reflux disease 07/15/2021   Uterine leiomyoma 04/19/2020   Genetic testing 04/19/2020   Family history of ovarian cancer 04/03/2020   Family history of leukemia 04/03/2020   Family history of breast cancer 04/03/2020   Malignant neoplasm of upper-outer quadrant of right breast in female, estrogen receptor negative (HCC) 03/28/2020   Overweight (BMI 25.0-29.9) 12/05/2019   Essential hypertension, benign 05/29/2018   Iritis of left eye 05/29/2018   Iron deficiency anemia due to chronic blood loss 05/29/2018   Primary hypothyroidism 05/29/2018    REFERRING DIAG: right breast cancer at risk for lymphedema  THERAPY DIAG:  Aftercare following surgery for neoplasm  PERTINENT HISTORY: Patient was diagnosed on 03/14/2020 with right grade 2 invasive ductal carcinoma breast cancer.  She underwent neoadjuvant chemo followed by a right lumpectomy and targeted node dissection (7 negative lymph nodes) on 08/20/2020. It is ER/PR negative and HER2 positive with a Ki67 of 30%. She has a positive axillary lymph node. Still has port on the left  side for infusion for Herception, Projeta. Port removed on 04/30/2021   PRECAUTIONS: right UE Lymphedema risk, None  SUBJECTIVE: Pt returns for her 3 month L-Dex screen.   PAIN:  Are you having pain? No  SOZO SCREENING: Patient was assessed today using the SOZO machine to determine the lymphedema index score. This was compared to her baseline score. It was determined that she is within the recommended range when compared to her baseline and no further action is needed at this time. She will  continue SOZO screenings. These are done every 3 months for 2 years post operatively followed by every 6 months for 2 years, and then annually.   L-DEX FLOWSHEETS - 01/04/23 0800       L-DEX LYMPHEDEMA SCREENING   Measurement Type Unilateral    L-DEX MEASUREMENT EXTREMITY Upper Extremity    POSITION  Standing    DOMINANT SIDE Left    At Risk Side Right    BASELINE SCORE (UNILATERAL) 4.2    L-DEX SCORE (UNILATERAL) -1.2    VALUE CHANGE (UNILAT) -5.4                 Hermenia Bers, PTA 01/04/2023, 8:12 AM

## 2023-01-11 ENCOUNTER — Ambulatory Visit: Attending: Cardiology

## 2023-01-11 DIAGNOSIS — Z0000 Encounter for general adult medical examination without abnormal findings: Secondary | ICD-10-CM

## 2023-01-11 NOTE — Progress Notes (Signed)
Appointment Outcome: Completed, Session #: 4                        Start time: 9:00am   End time: 9:26am   Total Mins: 26 minutes  AGREEMENTS SECTION   Overall Goal(s): Reduce blood pressure to ?130/80 over the next three months Reduce A1c over the next three months     Agreement/Action Steps:  Monitor sodium and sugar intake by reading food labels Aim for 1500mg  of sodium per day Aim for 25 mg of sugar per day  Practice portion control per recommended serving size (or less) on food label Log food using Fitbit    Progress Notes:  Patient shared that she has been doing well with her eating habits although she has dealt with some challenging moments over the past two weeks with the deaths of loved ones. Patient stated due to these circumstances, she has not been able to log her meals as much. Patient stated that she continues to eat the same meals at home and have them logged into her Fitbit, which still helps with tracking her sodium consumption. Patient mentioned that she did some basic tracking on the meals that she ate at other locations but was not able to get an accurate account of the sodium and sugars in those meals because they were made by someone else.   Patient shared that she asks for certain foods (e.g., fries) not to be salted or seasoned at fast food restaurants. Patient stated that she also would eat half of her food at the time of ordering it and the other half is saved for later, because she understands how much sodium she is consuming at one time compared to over the course of the day when she does not prepare her food. Patient stated that at the family gatherings, she was mindful at the amount of food (e.g., a few spoonsful of sides and 1 piece of meat) that she was putting on her plate to minimize the amount of sodium and sugar that she was consuming. Patient shared that she could taste when a food had a lot of salt on it and would only eat a portion of that food.    Patient mentioned that she has been working on increasing her protein intake by including plant-based protein powder (21 grams per serving) and collagen with protein (19 grams per serving). Patient shared that these protein sources increased dietary protein by 40 grams. Patient stated that after increasing her protein intake she has not experienced many cravings for sugar. Patient mentioned that if she does have a sweet treat, she is able to forego eating anything sugary for 2-3 days at a time. Patient shared that she continues to read food labels to ensure that she is not consuming a lot of sodium or sugar. Patient reported that she eats yogurt for breakfast and shared the sodium and sugar content in it. Patient mentioned that the trail mix that she makes contains almonds, walnuts, pecans, pumpkin seeds, and craisins. Patient stated that she is aware that the pumpkin seeds have add sodium, while the other ingredients are unsalted.  Patient stated that she has become more mindful of what she is eating and how much of the food she is eating at once. Patient expressed that reading food labels and practicing portion control has been helpful in reducing her sodium and sugar intake over the past two months. Discussed with patient her blood pressure readings in Vivify with  an average reading of 123/82. 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 Patient will continue to monitor her sugar intake with the goal of reducing her A1c by the time she's scheduled for lab work (currently 6.1, goal <5.7).    Indicators of Success and Accountability: Patient's bp 2-week average is 123/82.  Readiness: Patient is in the action stage of reducing her blood pressure to ?130/80 and A1c below the prediabetes range over the next three months  Strengths and Supports: Patient is relying on  knowledge gained from tracking meals and reading food labels to remain disciplined in her healthy eating habits.  Challenges and Barriers: Patient does not foresee any challenges/barriers to her goals.    Coaching Outcomes: Patient will continue to implement her action steps over the next two weeks as outlined above.  Patient will continue to implement strategies to minimize sodium and sugar consumption when eating from restaurants and food that other people prepare at their homes.       Attempted: Fulfilled - Patient completed the bi-weekly agreement in full and was able to meet the challenge.

## 2023-01-12 ENCOUNTER — Telehealth: Payer: Self-pay | Admitting: Hematology and Oncology

## 2023-01-12 NOTE — Telephone Encounter (Signed)
Rescheduled appointment per provider PAL. Patient is aware of the changes made to her upcoming appointment. 

## 2023-01-14 NOTE — Patient Instructions (Signed)
Hypertension, Adult High blood pressure (hypertension) is when the force of blood pumping through the arteries is too strong. The arteries are the blood vessels that carry blood from the heart throughout the body. Hypertension forces the heart to work harder to pump blood and may cause arteries to become narrow or stiff. Untreated or uncontrolled hypertension can lead to a heart attack, heart failure, a stroke, kidney disease, and other problems. A blood pressure reading consists of a higher number over a lower number. Ideally, your blood pressure should be below 120/80. The first ("top") number is called the systolic pressure. It is a measure of the pressure in your arteries as your heart beats. The second ("bottom") number is called the diastolic pressure. It is a measure of the pressure in your arteries as the heart relaxes. What are the causes? The exact cause of this condition is not known. There are some conditions that result in high blood pressure. What increases the risk? Certain factors may make you more likely to develop high blood pressure. Some of these risk factors are under your control, including: Smoking. Not getting enough exercise or physical activity. Being overweight. Having too much fat, sugar, calories, or salt (sodium) in your diet. Drinking too much alcohol. Other risk factors include: Having a personal history of heart disease, diabetes, high cholesterol, or kidney disease. Stress. Having a family history of high blood pressure and high cholesterol. Having obstructive sleep apnea. Age. The risk increases with age. What are the signs or symptoms? High blood pressure may not cause symptoms. Very high blood pressure (hypertensive crisis) may cause: Headache. Fast or irregular heartbeats (palpitations). Shortness of breath. Nosebleed. Nausea and vomiting. Vision changes. Severe chest pain, dizziness, and seizures. How is this diagnosed? This condition is diagnosed by  measuring your blood pressure while you are seated, with your arm resting on a flat surface, your legs uncrossed, and your feet flat on the floor. The cuff of the blood pressure monitor will be placed directly against the skin of your upper arm at the level of your heart. Blood pressure should be measured at least twice using the same arm. Certain conditions can cause a difference in blood pressure between your right and left arms. If you have a high blood pressure reading during one visit or you have normal blood pressure with other risk factors, you may be asked to: Return on a different day to have your blood pressure checked again. Monitor your blood pressure at home for 1 week or longer. If you are diagnosed with hypertension, you may have other blood or imaging tests to help your health care provider understand your overall risk for other conditions. How is this treated? This condition is treated by making healthy lifestyle changes, such as eating healthy foods, exercising more, and reducing your alcohol intake. You may be referred for counseling on a healthy diet and physical activity. Your health care provider may prescribe medicine if lifestyle changes are not enough to get your blood pressure under control and if: Your systolic blood pressure is above 130. Your diastolic blood pressure is above 80. Your personal target blood pressure may vary depending on your medical conditions, your age, and other factors. Follow these instructions at home: Eating and drinking  Eat a diet that is high in fiber and potassium, and low in sodium, added sugar, and fat. An example of this eating plan is called the DASH diet. DASH stands for Dietary Approaches to Stop Hypertension. To eat this way: Eat   plenty of fresh fruits and vegetables. Try to fill one half of your plate at each meal with fruits and vegetables. Eat whole grains, such as whole-wheat pasta, brown rice, or whole-grain bread. Fill about one  fourth of your plate with whole grains. Eat or drink low-fat dairy products, such as skim milk or low-fat yogurt. Avoid fatty cuts of meat, processed or cured meats, and poultry with skin. Fill about one fourth of your plate with lean proteins, such as fish, chicken without skin, beans, eggs, or tofu. Avoid pre-made and processed foods. These tend to be higher in sodium, added sugar, and fat. Reduce your daily sodium intake. Many people with hypertension should eat less than 1,500 mg of sodium a day. Do not drink alcohol if: Your health care provider tells you not to drink. You are pregnant, may be pregnant, or are planning to become pregnant. If you drink alcohol: Limit how much you have to: 0-1 drink a day for women. 0-2 drinks a day for men. Know how much alcohol is in your drink. In the [place] one drink equals one 12 oz bottle of beer (355 mL), one 5 oz glass of wine (148 mL), or one 1 oz glass of hard liquor (44 mL). Lifestyle  Work with your health care provider to maintain a healthy body weight or to lose weight. Ask what an ideal weight is for you. Get at least 30 minutes of exercise that causes your heart to beat faster (aerobic exercise) most days of the week. Activities may include walking, swimming, or biking. Include exercise to strengthen your muscles (resistance exercise), such as Pilates or lifting weights, as part of your weekly exercise routine. Try to do these types of exercises for 30 minutes at least 3 days a week. Do not use any products that contain nicotine or tobacco. These products include cigarettes, chewing tobacco, and vaping devices, such as e-cigarettes. If you need help quitting, ask your health care provider. Monitor your blood pressure at home as told by your health care provider. Keep all follow-up visits. This is important. Medicines Take over-the-counter and prescription medicines only as told by your health care provider. Follow directions carefully. Blood  pressure medicines must be taken as prescribed. Do not skip doses of blood pressure medicine. Doing this puts you at risk for problems and can make the medicine less effective. Ask your health care provider about side effects or reactions to medicines that you should watch for. Contact a health care provider if you: Think you are having a reaction to a medicine you are taking. Have headaches that keep coming back (recurring). Feel dizzy. Have swelling in your ankles. Have trouble with your vision. Get help right away if you: Develop a severe headache or confusion. Have unusual weakness or numbness. Feel faint. Have severe pain in your chest or abdomen. Vomit repeatedly. Have trouble breathing. These symptoms may be an emergency. Get help right away. Call 911. Do not wait to see if the symptoms will go away. Do not drive yourself to the hospital. Summary Hypertension is when the force of blood pumping through your arteries is too strong. If this condition is not controlled, it may put you at risk for serious complications. Your personal target blood pressure may vary depending on your medical conditions, your age, and other factors. For most people, a normal blood pressure is less than 120/80. Hypertension is treated with lifestyle changes, medicines, or a combination of both. Lifestyle changes include losing weight, eating a healthy,   low-sodium diet, exercising more, and limiting alcohol. This information is not intended to replace advice given to you by your health care provider. Make sure you discuss any questions you have with your health care provider. Document Revised: 03/11/2021 Document Reviewed: 03/11/2021 Elsevier Patient Education  2024 [employer]  

## 2023-01-19 ENCOUNTER — Ambulatory Visit: Admitting: Hematology and Oncology

## 2023-01-20 ENCOUNTER — Encounter: Payer: Self-pay | Admitting: Internal Medicine

## 2023-01-20 ENCOUNTER — Ambulatory Visit: Admitting: Internal Medicine

## 2023-01-20 VITALS — BP 138/110 | HR 103 | Ht 68.0 in | Wt 191.0 lb

## 2023-01-20 DIAGNOSIS — M65332 Trigger finger, left middle finger: Secondary | ICD-10-CM

## 2023-01-20 DIAGNOSIS — R7309 Other abnormal glucose: Secondary | ICD-10-CM

## 2023-01-20 DIAGNOSIS — E039 Hypothyroidism, unspecified: Secondary | ICD-10-CM | POA: Diagnosis not present

## 2023-01-20 DIAGNOSIS — E559 Vitamin D deficiency, unspecified: Secondary | ICD-10-CM

## 2023-01-20 DIAGNOSIS — Z23 Encounter for immunization: Secondary | ICD-10-CM | POA: Diagnosis not present

## 2023-01-20 DIAGNOSIS — I10 Essential (primary) hypertension: Secondary | ICD-10-CM | POA: Diagnosis not present

## 2023-01-20 NOTE — Assessment & Plan Note (Signed)
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 Denies use of caffeinated drinks today. Most of her home readings have diastolic reading above 80.  I think she would benefit from valsartan OR amlodipine increase. However, she is followed by HTN clinic, so will defer management to their team. Her goal is to fully get off of BP meds. She may benefit from incorporating hibiscus teas into her diet. I will check renal function today. She will rto in March 2025 for her full physical exam.

## 2023-01-20 NOTE — Assessment & Plan Note (Signed)
Chronic, currently on Synthroid daily. I will check thyroid function and adjust meds as needed.

## 2023-01-20 NOTE — Progress Notes (Signed)
I,Victoria T Deloria Lair, CMA,acting as a Neurosurgeon for Gwynneth Aliment, MD.,have documented all relevant documentation on the behalf of Gwynneth Aliment, MD,as directed by  Gwynneth Aliment, MD while in the presence of Gwynneth Aliment, MD.  Subjective:  Patient ID: Shannon Kane , female    DOB: 02-Jun-1970 , 52 y.o.   MRN: [id number]  Chief Complaint  Patient presents with   Hypertension   Hypothyroidism    HPI  The patient is here today for a follow-up for her blood pressure & thyroid.  She reports compliance with meds. She denies having headaches, chest pain and shortness of breath. She reports no specific questions or concerns.   Per HTN clinic she is to monitor her bp by checking it twice daily.      Hypertension This is a chronic problem. The current episode started more than 1 year ago. The problem has been gradually improving since onset. The problem is controlled. Pertinent negatives include no blurred vision, chest pain, orthopnea, palpitations or shortness of breath. Past treatments include ACE inhibitors. The current treatment provides moderate improvement. Identifiable causes of hypertension include a thyroid problem.  Thyroid Problem Presents for follow-up visit. Patient reports no cold intolerance, leg swelling, palpitations or weight gain. The symptoms have been stable.     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 Antony Blackbird   Hypertension    Hyperthyroidism    in the past   Hypothyroidism    Iron deficiency anemia    Palpitation    Personal history of chemotherapy    Personal history of radiation therapy    Spontaneous ecchymoses    Tachycardia    Vitamin D deficiency      Family History  Problem Relation Age of Onset   Diabetes Mother     Hypertension Mother    Leukemia Father        dx > 50   Ovarian cancer Maternal Grandmother 2   Bone cancer Paternal Grandfather        dx unknown age   Cancer Maternal Uncle        Mouth and throad; dx > 50   Breast cancer Other        MGM's niece, dx unknown age   Cancer Maternal Uncle        unknown type; dx > 50     Current Outpatient Medications:    amLODipine (NORVASC) 2.5 MG tablet, Take 1 tablet (2.5 mg total) by mouth daily., Disp: 90 tablet, Rfl: 3   anastrozole (ARIMIDEX) 1 MG tablet, Take 1 tablet (1 mg total) by mouth daily., Disp: 90 tablet, Rfl: 1   Cholecalciferol (VITAMIN D-3 PO), Take 5,000 Units by mouth daily., Disp: , Rfl:    MAGNESIUM GLYCINATE PO, Take by mouth., Disp: , Rfl:    SYNTHROID 75 MCG tablet, TAKE 1 TABLET DAILY BEFORE BREAKFAST, Disp: 90 tablet, Rfl: 3   valsartan (DIOVAN) 80 MG tablet, Take 1 tablet (80 mg total) by mouth daily. (Patient taking differently: Take 80 mg by mouth daily. Patient reports HTN clinic has her take 1/2. 40MG .), Disp: , Rfl:    vitamin B-12 (CYANOCOBALAMIN) 1000 MCG tablet, Take 1,000 mcg by mouth daily., Disp: , Rfl:    Allergies  Allergen Reactions   Amoxicillin     Tolerates ANCEF   Ciprofloxacin Itching   Penicillins Hives    Tolerates ANCEF      Review of Systems  Constitutional: Negative.  Negative for weight gain.  HENT: Negative.    Eyes:  Negative for blurred vision.  Respiratory: Negative.  Negative for shortness of breath.   Cardiovascular: Negative.  Negative for chest pain, palpitations and orthopnea.  Gastrointestinal: Negative.   Endocrine: Negative for cold intolerance.  Musculoskeletal:  Positive for arthralgias.       She c/o left middle trigger finger. She has had similar sx in the past.  Has not had injections in the past. States her sx went away on their own. She is not sure what may have triggered her sx. She is left-handed.   Neurological: Negative.   Psychiatric/Behavioral: Negative.        Today's Vitals   01/20/23 0823 01/20/23 0849  BP: (!) 146/99 (!) 138/110  Pulse: (!) 110 (!) 103  SpO2: 98%   Weight: 191 lb (86.6 kg)   Height: 5\' 8"  (1.727 m)    Body mass index is 29.04 kg/m.  Wt Readings from Last 3 Encounters:  01/20/23 191 lb (86.6 kg)  01/04/23 190 lb 8 oz (86.4 kg)  12/10/22 190 lb (86.2 kg)    BP Readings from Last 3 Encounters:  01/20/23 (!) 138/110  12/10/22 115/75  10/15/22 (!) 145/100     Objective:  Physical Exam Vitals and nursing note reviewed.  Constitutional:      Appearance: Normal appearance.  HENT:     Head: Normocephalic and atraumatic.  Eyes:     Extraocular Movements: Extraocular movements intact.  Cardiovascular:     Rate and Rhythm: Regular rhythm. Tachycardia present.     Heart sounds: Normal heart sounds.  Pulmonary:     Effort: Pulmonary effort is normal.     Breath sounds: Normal breath sounds.  Musculoskeletal:     Cervical back: Normal range of motion.  Skin:    General: Skin is warm.  Neurological:     General: No focal deficit present.     Mental Status: She is alert.  Psychiatric:        Mood and Affect: Mood normal.        Behavior: Behavior normal.         Assessment And Plan:  Essential hypertension, benign Assessment & Plan: Uncontrolled, she came in after spin class. Denies use of caffeinated drinks today. Most of her home readings have diastolic reading above 80.  I think she would benefit from valsartan OR amlodipine increase. However, she is followed by HTN clinic, so will defer management to their team. Her goal is to fully get off of BP meds. She may benefit from incorporating hibiscus teas into her diet. I will check renal function today. She will rto in March 2025 for her full physical exam.  Orders: -     CMP14+EGFR  Primary hypothyroidism Assessment & Plan: Chronic, currently on Synthroid daily. I will check thyroid function and adjust meds as needed.   Orders: -     Lipid  panel -     TSH + free T4  Trigger middle finger of left hand Assessment & Plan: Advised to try splint. She does not wish to see hand specialist at this time. Will refer as needed.    Other abnormal glucose Assessment & Plan: Pt advised August 2024 reading was at 6.5 which is in diabetes  range. Previous labs reviewed, her A1c has been elevated in the past. I will check an A1c today. Reminded to avoid refined sugars including sugary drinks/foods and processed meats including bacon, sausages and deli meats.    Orders: -     Hemoglobin A1c  Immunization due -     Tdap vaccine greater than or equal to 7yo IM     Return for 6 month bp & thyroid f/u. Marland Kitchen  Patient was given opportunity to ask questions. Patient verbalized understanding of the plan and was able to repeat key elements of the plan. All questions were answered to their satisfaction.    I, Gwynneth Aliment, MD, have reviewed all documentation for this visit. The documentation on 01/20/23 for the exam, diagnosis, procedures, and orders are all accurate and complete.   IF YOU HAVE BEEN REFERRED TO A SPECIALIST, IT MAY TAKE 1-2 WEEKS TO SCHEDULE/PROCESS THE REFERRAL. IF YOU HAVE NOT HEARD FROM US/SPECIALIST IN TWO WEEKS, PLEASE GIVE [place] A CALL AT [phone] X [phone].   THE PATIENT IS ENCOURAGED TO PRACTICE SOCIAL DISTANCING DUE TO THE COVID-19 PANDEMIC.

## 2023-01-20 NOTE — Assessment & Plan Note (Signed)
Advised to try splint. She does not wish to see hand specialist at this time. Will refer as needed.

## 2023-01-20 NOTE — Assessment & Plan Note (Signed)
Pt advised August 2024 reading was at 6.5 which is in diabetes range. Previous labs reviewed, her A1c has been elevated in the past. I will check an A1c today. Reminded to avoid refined sugars including sugary drinks/foods and processed meats including bacon, sausages and deli meats.

## 2023-01-21 LAB — LIPID PANEL
Chol/HDL Ratio: 3.1 ratio (ref 0.0–4.4)
Cholesterol, Total: 183 mg/dL (ref 100–199)
HDL: 60 mg/dL (ref >39)
LDL Chol Calc (NIH): 109 mg/dL — ABNORMAL HIGH (ref 0–99)
Triglycerides: 78 mg/dL (ref 0–149)
VLDL Cholesterol Cal: 14 mg/dL (ref 5–40)

## 2023-01-21 LAB — HEMOGLOBIN A1C
Est. average glucose Bld gHb Est-mCnc: 134 mg/dL
Hgb A1c MFr Bld: 6.3 % — ABNORMAL HIGH (ref 4.8–5.6)

## 2023-01-21 LAB — CMP14+EGFR
ALT: 22 IU/L (ref 0–32)
AST: 24 IU/L (ref 0–40)
Albumin: 4.4 g/dL (ref 3.8–4.9)
Alkaline Phosphatase: 128 IU/L — ABNORMAL HIGH (ref 44–121)
BUN/Creatinine Ratio: 9 (ref 9–23)
BUN: 8 mg/dL (ref 6–24)
Bilirubin Total: 0.4 mg/dL (ref 0.0–1.2)
CO2: 23 mmol/L (ref 20–29)
Calcium: 9.8 mg/dL (ref 8.7–10.2)
Chloride: 101 mmol/L (ref 96–106)
Creatinine, Ser: 0.9 mg/dL (ref 0.57–1.00)
Globulin, Total: 3.3 g/dL (ref 1.5–4.5)
Glucose: 96 mg/dL (ref 70–99)
Potassium: 4.1 mmol/L (ref 3.5–5.2)
Sodium: 141 mmol/L (ref 134–144)
Total Protein: 7.7 g/dL (ref 6.0–8.5)
eGFR: 76 mL/min/{1.73_m2} (ref >59)

## 2023-01-21 LAB — TSH+FREE T4
Free T4: 1.29 ng/dL (ref 0.82–1.77)
TSH: 1.14 u[IU]/mL (ref 0.450–4.500)

## 2023-01-25 ENCOUNTER — Ambulatory Visit: Attending: Cardiovascular Disease

## 2023-01-25 ENCOUNTER — Telehealth: Payer: Self-pay

## 2023-01-25 DIAGNOSIS — R7303 Prediabetes: Secondary | ICD-10-CM

## 2023-01-25 DIAGNOSIS — Z0000 Encounter for general adult medical examination without abnormal findings: Secondary | ICD-10-CM

## 2023-01-25 NOTE — Progress Notes (Signed)
Appointment Outcome: Completed, Session #: 5                         Start time: 9:20am   End time: 9:55am   Total Mins: 35 minutes  AGREEMENTS SECTION   Overall Goal(s): Reduce blood pressure to ?130/80 over the next three months Reduce A1c over the next three months     Agreement/Action Steps:  Monitor sodium and sugar intake by reading food labels Aim for 1500 mg of sodium per day Aim for 25 g of sugar per day  Practice portion control per recommended serving size (or less) on food label Log food using Fitbit    Progress Notes:  Patient progress towards goals  Vivify blood pressure 14-day average: 126/85 HR 87  A1c: 6.3 (01/20/23) up from 6.1 (6 months ago)    Patient expressed concern about having some elevated blood pressure readings and an increase in her A1c. Patient's blood pressure readings are typically within normal range. Discussed with patient how the nurse monitors for trends over the course of two weeks. Patient mentioned that one of her most recent readings on 9/4 was elevated and taken with the Vivify machine on 9/4. Patient later learned that day that she had Covid-19.  Patient is unsure what may have caused an increase in her A1c since she has been diligent about lowering her sugar intake over the past 2.5 months. Patient mentioned that for 3 days she did consume more than 25 grams of sugar per day when she drunk orange juice that contained 23 grams of sugar per 8 oz. Patient reported that she drunk an 8 oz glass 3-4 times per day.   Patient stated that she did not eat as frequent while sick but focused on consuming protein such as chicken and salmon. Patient shared how her Fitbit breaks down the nutritional content of the food that includes saturated and trans-fat.  Patient mentioned that her LDL increased, while her total cholesterol was within normal range. Patient reported that she sodium level was also within the normal range.   Discussed with patient how she  can continue to work improving her healthy eating habits to work towards her goal. Patient expressed wanting to learn more about eating specifically to improve her currently health concerns.    Indicators of Success and Accountability: Patient's recent two-week blood pressure average of 126/85 is within the goal range.  Readiness: Patient is in the action stage of improving her blood pressure and A1c.  Strengths and Supports: Patient is relying on educating herself about her conditions and following through with treatment recommendations to improve her blood pressure and A1c.  Challenges and Barriers: Patient is concerned about the increase in her A1c after reducing sugars that she consumes and is not sure what is contributing to change.   Coaching Outcomes: Patient will continue to implement action steps as outlined above over the next two weeks while investigating her concerns regarding the increase in LDL cholesterol and A1c among other questions that she has about her labs.   Patient is interested in looking into how she can improve her LDL cholesterol and A1c, while increasing her dietary fiber.  Patient is interested in learning about various factors that could contribute to the abnormal results in her recent labs (e.g., medication, illness, and/or diet).  Patient will follow up with provider.    Attempted: Fulfilled - Patient completed the bi-weekly agreement in full and was able to meet the challenge.

## 2023-01-25 NOTE — Telephone Encounter (Signed)
Called patient to hold health coaching appointment. Patient was on a meeting call at work. Requested to be called back in 15 minutes.   [name], MS, ERHD, [facility]  Care Guide, Health & Wellness Coach [address] [place] [address] Telephone: [phone] Email: [email or web address]

## 2023-01-25 NOTE — Telephone Encounter (Signed)
error 

## 2023-02-08 ENCOUNTER — Ambulatory Visit

## 2023-02-08 DIAGNOSIS — Z0000 Encounter for general adult medical examination without abnormal findings: Secondary | ICD-10-CM

## 2023-02-08 NOTE — Progress Notes (Signed)
Appointment Outcome: Completed, Session #: 6                        Start time: 9:00am   End time: 9:31am   Total Mins: 31 minutes  AGREEMENTS SECTION   Overall Goal(s): Reduce blood pressure to <=130/80 over the next three months Reduce A1c over the next three months    Agreement/Action Steps:  Monitor sodium and sugar intake by reading food labels Aim for 1500 mg of sodium per day Aim for 25 g of sugar per day  Practice portion control per recommended serving size (or less) on food label Log food using Fitbit        Progress Notes:  Patient reported that she reads the food label to understand the nutritional value for the recommended serving size. Patient shared some of the food labels of the snacks that she consumes (e.g., variety of nuts and seeds). Patient mentioned that she measures out the serving size per food item and put them in a bag. Patient shared that although a serving size is 1/4 cup, she does not eat an entire serving of each food item to aid in practicing portion control.   Patient explained that she continues to monitor her sodium intake and provided an example of how she read food labels as she prepares her meals and portion her servings. Patient stated that she cuts down on sodium by using fresh produce and by using products that are low in sodium. Patient stated that instead of purchasing a pizza from a restaurant, she makes them at home with low sodium and fresh ingredients. Patient stated that she eats these types of foods sparingly.  Patient stated that she does not add sodium to her food prepared at home. Patient mentioned that she has noticed that if she were to eat something that was slightly salty, that it does not adversely affects her blood pressure. Patient stated that she has observed lower bp readings 20-30 minutes after morning walks and not taking her medication 2 hours prior to checking her bp. Patient stated that she notices that 20-30 minutes after  she has exercised and took her medication 2 hours prior to checking her bp, then it is higher.   Patient shared how she has begun to look at the total and added sugars in food products. Patient stated that it is challenging to consume a low amount of sugar when you try to incorporate a variety of foods. Patient explained that she continues to work towards lowering her sugar consumption by practicing portion control.   Indicators of Success and Accountability: Patient measures food per recommended serving size and patient monitors her blood pressure readings twice daily with Vivify.  Readiness: Patient is in the action stage of reducing her blood pressure to <=130/80 and A1c over the next three months.  Strengths and Supports: Patient is seeking the support of a nutritionist. Patient is relying on being knowledgeable about the nutritional value of the foods she eats.   Challenges and Barriers: Patient does not foresee any challenges to implementing her action steps over the next month.   Coaching Outcomes: Patient expressed that she feels that she has made a lot of progress with her eating habits by reading food labels and practicing portion control. Patient stated that she didn't want to make changes to the current steps.   Discussed with patient if there were any additional support or resources, she felt that she needed as we transitioned  into the follow-up phase of health coaching. Patient stated that talking to a nutritionist would be helpful to understand if the foods she removed from her diet was a healthy choice and to determine what additional changes to make to lower her A1c and LDL cholesterol.  Patient will continue to implement her original action steps to maintain the progress that she has made.     Attempted: Fulfilled - patient completed the bi-weekly agreement in full and was able to meet the challenge   Referrals: Sent request to Gillian Shields, NP for referral to  nutritionist to aid patient in lowering her A1c and LDL cholesterol.

## 2023-02-08 NOTE — Addendum Note (Signed)
Addended by: Alver Sorrow on: 02/08/2023 12:58 PM   Modules accepted: Orders

## 2023-02-09 ENCOUNTER — Inpatient Hospital Stay: Attending: Hematology and Oncology | Admitting: Hematology and Oncology

## 2023-02-09 VITALS — BP 133/96 | HR 78 | Temp 97.5°F | Resp 18 | Ht 68.0 in | Wt 193.7 lb

## 2023-02-09 DIAGNOSIS — R232 Flushing: Secondary | ICD-10-CM | POA: Diagnosis not present

## 2023-02-09 DIAGNOSIS — Z923 Personal history of irradiation: Secondary | ICD-10-CM | POA: Insufficient documentation

## 2023-02-09 DIAGNOSIS — Z79811 Long term (current) use of aromatase inhibitors: Secondary | ICD-10-CM | POA: Insufficient documentation

## 2023-02-09 DIAGNOSIS — Z171 Estrogen receptor negative status [ER-]: Secondary | ICD-10-CM | POA: Diagnosis not present

## 2023-02-09 DIAGNOSIS — E7800 Pure hypercholesterolemia, unspecified: Secondary | ICD-10-CM | POA: Diagnosis not present

## 2023-02-09 DIAGNOSIS — Z170 Estrogen receptor positive status [ER+]: Secondary | ICD-10-CM | POA: Insufficient documentation

## 2023-02-09 DIAGNOSIS — M2560 Stiffness of unspecified joint, not elsewhere classified: Secondary | ICD-10-CM | POA: Insufficient documentation

## 2023-02-09 DIAGNOSIS — E785 Hyperlipidemia, unspecified: Secondary | ICD-10-CM | POA: Insufficient documentation

## 2023-02-09 DIAGNOSIS — C50411 Malignant neoplasm of upper-outer quadrant of right female breast: Secondary | ICD-10-CM | POA: Diagnosis not present

## 2023-02-09 DIAGNOSIS — I10 Essential (primary) hypertension: Secondary | ICD-10-CM | POA: Insufficient documentation

## 2023-02-09 NOTE — Progress Notes (Signed)
Patient Care Team: Dorothyann Peng, MD as PCP - General (Internal Medicine) Chilton Si, MD as PCP - Cardiology (Cardiology) Abigail Miyamoto, MD as Consulting Physician (General Surgery) Serena Croissant, MD as Consulting Physician (Hematology and Oncology) Antony Blackbird, MD as Consulting Physician (Radiation Oncology) Schuyler Amor, MD as Referring Physician (Obstetrics and Gynecology) [name] (Cardiology)  DIAGNOSIS:  Encounter Diagnosis  Name Primary?   Malignant neoplasm of upper-outer quadrant of right breast in female, estrogen receptor negative (HCC) Yes    SUMMARY OF ONCOLOGIC HISTORY: Oncology History  Malignant neoplasm of upper-outer quadrant of right breast in female, estrogen receptor negative (HCC)  03/28/2020 Initial Diagnosis   Patient palpated a right breast mass x2 wks. Mammogram and US showed a 2.0cm mass at the 10 o'clock position in the right breast and two enlarged right axillary lymph nodes, 2.0cm and 1.7cm. 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   04/16/2020 - 07/30/2020 Chemotherapy   TCHP x6 cycles, pathologic complete response, Herceptin Perjeta maintenance    04/18/2020 Genetic Testing   Negative genetic testing: no pathogenic variants detected in [facility] Multi-Cancer Panel.  Variants of uncertain significance detected in CDH1 at c.1289T>G (p.Val430Gly) and EGFR at c.2873G>A (p.Arg958His).  The report date is April 18, 2020.   The Multi-Cancer Panel offered by [facility] includes sequencing and/or deletion duplication testing of the following 85 genes: AIP, ALK, APC, ATM, AXIN2,BAP1,  BARD1, BLM, BMPR1A, BRCA1, BRCA2, BRIP1, CASR, CDC73, CDH1, CDK4, CDKN1B, CDKN1C, CDKN2A (p14ARF), CDKN2A (p16INK4a), CEBPA, CHEK2, CTNNA1, DICER1, DIS3L2, EGFR, EPCAM (Deletion/duplication testing only), FH, FLCN, GATA2, GPC3, GREM1 (Promoter region deletion/duplication testing only), HOXB13  (c.251G>A, p.Gly84Glu), HRAS, KIT, MAX, MEN1, MET, MITF (c.952G>A, p.Glu318Lys variant only), MLH1, MSH2, MSH3, MSH6, MUTYH, NBN, NF1, NF2, NTHL1, PALB2, PDGFRA, PHOX2B, PMS2, POLD1, POLE, POT1, PRKAR1A, PTCH1, PTEN, RAD50, RAD51C, RAD51D, RB1, RECQL4, RET, RNF43, RUNX1, SDHAF2, SDHA (sequence changes only), SDHB, SDHC, SDHD, SMAD4, SMARCA4, SMARCB1, SMARCE1, STK11, SUFU, TERC, TERT, TMEM127, TP53, TSC1, TSC2, VHL, WRN and WT1.    08/20/2020 Surgery   Right lumpectomy [name]): no residual carcinoma, 7 right axillary lymph nodes negative for carcinoma.   09/10/2020 - 04/15/2021 Chemotherapy   Patient is on Treatment Plan : BREAST Trastuzumab + Pertuzumab q21d      09/26/2020 - 11/12/2020 Radiation Therapy   Adjuvant radiation   01/21/2021 -  Anti-estrogen oral therapy   Letrozole     CHIEF COMPLIANT:   Discussed the use of AI scribe software for clinical note transcription with the patient, who gave verbal consent to proceed.  History of Present Illness   The patient, with a history of breast cancer, reports significant improvement in symptoms since switching from letrozole to anastrozole. The patient describes the improvement as "much, much, much, much, much, much better," with only occasional minor aches and hot flashes. The patient takes the medication at night, which has helped reduce daytime symptoms.  The patient also reports some chest discomfort and tenderness, particularly on the side. The patient has noticed a slight increase in fullness in the skin in this area. The patient is not overly concerned about this, as similar symptoms have been reported by others who have undergone similar treatment.  The patient is also being monitored for hypertension and has concerns about elevated LDL levels. The patient reports that despite efforts to manage these conditions, including attending a hypertension clinic and making dietary changes, the LDL levels continue to rise. The patient is  concerned  that the anastrozole may be contributing to this increase.         ALLERGIES:  is allergic to amoxicillin, ciprofloxacin, and penicillins.  MEDICATIONS:  Current Outpatient Medications  Medication Sig Dispense Refill   amLODipine (NORVASC) 2.5 MG tablet Take 1 tablet (2.5 mg total) by mouth daily. 90 tablet 3   anastrozole (ARIMIDEX) 1 MG tablet Take 1 tablet (1 mg total) by mouth daily. 90 tablet 1   Cholecalciferol (VITAMIN D-3 PO) Take 5,000 Units by mouth daily.     MAGNESIUM GLYCINATE PO Take by mouth.     SYNTHROID 75 MCG tablet TAKE 1 TABLET DAILY BEFORE BREAKFAST 90 tablet 3   valsartan (DIOVAN) 80 MG tablet Take 1 tablet (80 mg total) by mouth daily. (Patient taking differently: Take 80 mg by mouth daily. Patient reports HTN clinic has her take 1/2. 40MG .)     vitamin B-12 (CYANOCOBALAMIN) 1000 MCG tablet Take 1,000 mcg by mouth daily.     No current facility-administered medications for this visit.    PHYSICAL EXAMINATION: ECOG PERFORMANCE STATUS: 1 - Symptomatic but completely ambulatory  Vitals:   02/09/23 0820  BP: (!) 133/96  Pulse: 78  Resp: 18  Temp: (!) 97.5 F (36.4 C)  SpO2: 100%   Filed Weights   02/09/23 0820  Weight: 193 lb 11.2 oz (87.9 kg)    Physical Exam   VITALS: BP- 130/? BREAST: Left breast appears fuller than right. Tenderness in upper outer quadrant of left breast.      (exam performed in the presence of a chaperone)  LABORATORY DATA:  I have reviewed the data as listed    Latest Ref Rng & Units 01/20/2023    9:09 AM 07/20/2022   10:11 AM 04/20/2022    8:06 AM  CMP  Glucose 70 - 99 mg/dL 96  89  295   BUN 6 - 24 mg/dL 8  9  13    Creatinine 0.57 - 1.00 mg/dL 6.21  3.08  6.57   Sodium 134 - 144 mmol/L 141  142  141   Potassium 3.5 - 5.2 mmol/L 4.1  4.2  3.9   Chloride 96 - 106 mmol/L 101  105  106   CO2 20 - 29 mmol/L 23  25  30    Calcium 8.7 - 10.2 mg/dL 9.8  9.5  9.6   Total Protein 6.0 - 8.5 g/dL 7.7  7.2  7.3   Total Bilirubin  0.0 - 1.2 mg/dL 0.4  0.5  0.4   Alkaline Phos 44 - 121 IU/L 128  131  94   AST 0 - 40 IU/L 24  22  20    ALT 0 - 32 IU/L 22  14  18      Lab Results  Component Value Date   WBC 4.7 07/20/2022   HGB 13.7 07/20/2022   HCT 41.7 07/20/2022   MCV 92 07/20/2022   PLT 241 07/20/2022   NEUTROABS 2.2 04/20/2022    ASSESSMENT & PLAN:  Malignant neoplasm of upper-outer quadrant of right breast in female, estrogen receptor negative (HCC) 03/28/2020:Patient palpated a right breast mass x2 wks. Mammogram and US showed a 2.0cm mass at the 10 o'clock position in the right breast and two enlarged right axillary lymph nodes, 2.0cm and 1.7cm. 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 Satellite lesion biopsy: DCIS ER 0%, PR 0%   Treatment plan: 1. Neoadjuvant chemotherapy with [facility]  Perjeta 6 cycles followed by Herceptin Perjeta maintenance for 1 year completed 04/15/2021 2. 08/20/2020:Right lumpectomy [name]): no residual carcinoma, 7 right axillary lymph nodes negative for carcinoma. 3. Adjuvant radiation: 09/26/2020-11/12/2020 4.  Antiestrogen therapy with letrozole (ER 10% weakly positive) ----------------------------------------------------------------------------------------------------------------------------------------- Current treatment: Initially letrozole switched to anastrozole half a tablet daily 1.  Joint stiffness and achiness especially in the fingers: Marked improvement 2. increased cholesterol: Closely being monitored.  If the cholesterol does not get better in spite of her best efforts we might have to stop her anastrozole therapy.  I suspect the elevated cholesterol is related to anastrozole   Breast cancer surveillance: 1.  Breast exam 02/09/2023: Benign 2. Mammogram 03/02/2022: Benign breast density category B   Her daughter received full scholarship to [employer].   Return to clinic in 1 year for  follow-up ------------------------------------- Assessment and Plan    Breast Cancer on Anastrozole Tolerating well with significant improvement in side effects. Noted occasional aches and hot flashes, but overall much better than previous therapy. Currently taking Anastrozole 1mg  every other day. -Continue Anastrozole 1mg  every other day.  Hypertension Patient is actively managing with a hypertension clinic. No changes in medication discussed. -Continue current management plan with hypertension clinic.  Hyperlipidemia Patient has noted an increase in LDL, potentially related to Anastrozole. Discussed potential need for intervention if LDL continues to rise despite best efforts. -Consider consultation with a nutritionist to evaluate dietary impact on LDL levels. -If LDL continues to rise, consider discontinuation of Anastrozole.  Breast Tenderness Patient reports tenderness and fullness in the breast, likely related to radiation therapy. No concerning findings on physical examination. -Continue to monitor.  Mammogram Due for routine mammogram next month. -Schedule mammogram for next month.  Follow-up Plan for routine follow-up in one year, unless new concerns arise.         No orders of the defined types were placed in this encounter.  The patient has a good understanding of the overall plan. she agrees with it. she will call with any problems that may develop before the next visit here. Total time spent: 30 mins including face to face time and time spent for planning, charting and co-ordination of care   Tamsen Meek, MD 02/09/23

## 2023-02-09 NOTE — Assessment & Plan Note (Addendum)
03/28/2020:Patient palpated a right breast mass x2 wks. Mammogram and US showed a 2.0cm mass at the 10 o'clock position in the right breast and two enlarged right axillary lymph nodes, 2.0cm and 1.7cm. 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 Satellite lesion biopsy: DCIS ER 0%, PR 0%   Treatment plan: 1. Neoadjuvant chemotherapy with TCH Perjeta 6 cycles followed by Herceptin Perjeta maintenance for 1 year completed 04/15/2021 2. 08/20/2020:Right lumpectomy [name]): no residual carcinoma, 7 right axillary lymph nodes negative for carcinoma. 3. Adjuvant radiation: 09/26/2020-11/12/2020 4.  Antiestrogen therapy with letrozole (ER 10% weakly positive) ----------------------------------------------------------------------------------------------------------------------------------------- Current treatment: Initially letrozole switched to anastrozole half a tablet daily 1.  Joint stiffness and achiness especially in the fingers: Marked improvement 2. increased cholesterol: Closely being monitored.  If the cholesterol does not get better in spite of her best efforts we might have to stop her anastrozole therapy.  I suspect the elevated cholesterol is related to anastrozole   Breast cancer surveillance: 1.  Breast exam 02/09/2023: Benign 2. Mammogram 03/02/2022: Benign breast density category B   Her daughter received full scholarship to [employer].   Return to clinic in 1 year for follow-up

## 2023-02-15 ENCOUNTER — Other Ambulatory Visit: Payer: Self-pay | Admitting: Pharmacist

## 2023-02-15 NOTE — Progress Notes (Incomplete)
Advanced Hypertension Clinic Follow-up:    Date:  02/15/2023   ID:  Shannon Kane, DOB 1971-02-13, MRN [id number]  PCP:  Dorothyann Peng, MD  Cardiologist:  Chilton Si, MD  Nephrologist:  Referring MD: Dorothyann Peng, MD   CC: Hypertension  History of Present Illness:    Shannon Kane is a 52 y.o. female with a hx of hypertension, hypothyroidism, type 2 diabetes, vitamin D deficiency, right breast cancer (s/p lumpectomy, chemo, radiation), here for follow-up. She was initially seen in the Advanced Hypertension Clinic by Gillian Shields, NP 10/15/2022. 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 Home readings ranged 110s-140s/73-80s before and after medications. Her blood pressure in the office was 145/100 on a regimen of amlodipine (taken in the AM) and valsartan (taken in the PM). Amlodipine was increased to 5 mg and she was enrolled in the [employer] RPM study. Metanephrines and catecholamines were normal. Renin-aldosterone levels were not consistent with hyperaldosteronism. Carotid and renal dopplers 11/2022 showed no significant stenosis. Prior echo 07/2021 showed LVEF 60-65%, no RWMA, grade 1 diastolic dysfunction, and no significant valvular abnormalities. She followed up with Luther Parody 11/2022 by video visit. It was noted that she had continued on the 2.5 mg dose of amlodipine. Blood pressures per [employer] had ranged 103/53-141/98 with average 121/82. After discussion, she preferred to remain on 2.5 mg amlodipine and reduce valsartan to a 1/2 tablet (80 mg total) daily. Blood pressure remained at goal per [employer] 12/2022 and no medication changes were made. She was seen by her PCP 01/20/2023 and it was noted that her home diastolic readings were consistently above 80 mmHg; medication management was deferred to the Advanced HTN clinic.  Today,  She denies any palpitations, chest pain, shortness of breath, peripheral  edema, lightheadedness, headaches, syncope, orthopnea, or PND.  (+)  Previous antihypertensives: HCTZ Lisinopril - switched to Valsartan  Past Medical History:  Diagnosis Date   Abnormal glucose    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 Antony Blackbird   Hypertension    Hyperthyroidism    in the past   Hypothyroidism    Iron deficiency anemia    Palpitation    Personal history of chemotherapy    Personal history of radiation therapy    Spontaneous ecchymoses    Tachycardia    Vitamin D deficiency     Past Surgical History:  Procedure Laterality Date   BREAST BIOPSY     BREAST EXCISIONAL BIOPSY     BREAST LUMPECTOMY Right 08/20/2020   w/ rad w/ chemo   BREAST LUMPECTOMY WITH RADIOACTIVE SEED LOCALIZATION Right 08/20/2020   Procedure: RADIOACTIVE SEED GUIDED RIGHT BREAST LUMPECTOMY;  Surgeon: Abigail Miyamoto, MD;  Location: [facility] OR;  Service: General;  Laterality: Right;   CHOLECYSTECTOMY     COLONOSCOPY  05/18/2018   KNEE SURGERY     PORT-A-CATH REMOVAL N/A 04/30/2021   Procedure: REMOVAL PORT-A-CATH;  Surgeon: Abigail Miyamoto, MD;  Location: [facility];  Service: General;  Laterality: N/A;   PORTACATH PLACEMENT Left 04/15/2020   Procedure: INSERTION PORT-A-CATH;  Surgeon: Abigail Miyamoto, MD;  Location: [facility];  Service: General;  Laterality: Left;   RADIOACTIVE SEED GUIDED AXILLARY SENTINEL LYMPH NODE Right 08/20/2020   Procedure: RADIOACTIVE SEED  TARGETED RIGHT AXILLARY LYMPH NODE DISSECTION;  Surgeon: Abigail Miyamoto, MD;  Location: [facility] OR;  Service: General;  Laterality: Right;   TONSILLECTOMY      Current Medications: No outpatient medications have been marked as taking for the 02/17/23 encounter (Appointment) with Chilton Si, MD.     Allergies:    Amoxicillin, Ciprofloxacin, and Penicillins   Social History   Socioeconomic History   Marital status: Married    Spouse name: Not on file   Number of children: Not on file   Years of education: Not on file   Highest education level: Not on file  Occupational History   Not on file  Tobacco Use   Smoking status: Never   Smokeless tobacco: Never  Vaping Use   Vaping status: Never Used  Substance and Sexual Activity   Alcohol use: Yes    Alcohol/week: 0.0 standard drinks of alcohol    Comment: social   Drug use: Never   Sexual activity: Not Currently    Birth control/protection: None  Other Topics Concern   Not on file  Social History Narrative   Not on file   Social Determinants of Health   Financial Resource Strain: Low Risk  (10/15/2022)   Overall Financial Resource Strain (CARDIA)    Difficulty of Paying Living Expenses: Not hard at all  Food Insecurity: No Food Insecurity (04/03/2020)   Hunger Vital Sign    Worried About Running Out of Food in the Last Year: Never true    Ran Out of Food in the Last Year: Never true  Transportation Needs: No Transportation Needs (04/03/2020)   PRAPARE - Administrator, Civil Service (Medical): No    Lack of Transportation (Non-Medical): No  Physical Activity: Sufficiently Active (10/15/2022)   Exercise Vital Sign    Days of Exercise per Week: 6 days    Minutes of Exercise per Session: 30 min  Stress: No Stress Concern Present (10/15/2022)   [employer] of Occupational Health - Occupational Stress Questionnaire    Feeling of Stress : Only a [place]  Social Connections: Socially Integrated (10/15/2022)   Social Connection and Isolation Panel [NHANES]    Frequency of Communication with Friends and Family: More than three times a week    Frequency of Social Gatherings with Friends and Family: Three times a week    Attends Religious Services: More than 4 times per year    Active Member of Clubs or Organizations: Yes     Attends Engineer, structural: More than 4 times per year    Marital Status: Married     Family History: The patient's family history includes Bone cancer in her paternal grandfather; Breast cancer in an other family member; Cancer in her maternal uncle and maternal uncle; Diabetes in her mother; Hypertension in her mother; Leukemia in her father; Ovarian cancer (age of onset: 47) in her maternal grandmother.  ROS:   Please see the history of present illness.     All other systems reviewed and are negative.  EKGs/Labs/Other Studies Reviewed:    Renal Artery Dopplers  11/23/2022: Summary:  Largest Aortic Diameter: 1.7 cm    Renal:  Right: Normal right Resisitive Index. Normal size right kidney.         Normal cortical thickness of right kidney. RRV flow present.         No evidence of right renal artery stenosis.  Left:  Abnormal left Resisitve Index. Normal size of left kidney.  Normal cortical thickness of the left kidney. LRV flow         present. No evidence of left renal artery stenosis.  Mesenteric:  Normal Superior Mesenteric artery findings.   Carotid Dopplers  11/23/2022: Summary:  Right Carotid: The extracranial vessels were near-normal with only minimal wall thickening or plaque.   Left Carotid: The extracranial vessels were near-normal with only minimal wall thickening or plaque.   Vertebrals:  Bilateral vertebral arteries demonstrate antegrade flow.  Subclavians: Normal flow hemodynamics were seen in bilateral subclavian arteries.   Echo  07/22/2021: Sonographer Comments: No subcostal window.   IMPRESSIONS   1. Left ventricular ejection fraction, by estimation, is 60 to 65%. Left  ventricular ejection fraction by 2D MOD biplane is 62.0 %. The left  ventricle has normal function. The left ventricle has no regional wall  motion abnormalities. Left ventricular  diastolic parameters are consistent with Grade I diastolic dysfunction  (impaired  relaxation).   2. Right ventricular systolic function is normal. The right ventricular  size is normal.   3. The mitral valve is grossly normal. No evidence of mitral valve  regurgitation.   4. The aortic valve is tricuspid. Aortic valve regurgitation is not  visualized.   Comparison(s): No significant change from prior study. 02/04/2021: LVEF  60-65%.    EKG:  EKG is personally reviewed. 02/17/2023: Sinus ***. Rate *** bpm.  Recent Labs: 07/20/2022: Hemoglobin 13.7; Platelets 241 01/20/2023: ALT 22; BUN 8; Creatinine, Ser 0.90; Potassium 4.1; Sodium 141; TSH 1.140   Recent Lipid Panel    Component Value Date/Time   CHOL 183 01/20/2023 0909   TRIG 78 01/20/2023 0909   HDL 60 01/20/2023 0909   CHOLHDL 3.1 01/20/2023 0909   CHOLHDL 4.3 04/20/2022 0805   VLDL 13 04/20/2022 0805   LDLCALC 109 (H) 01/20/2023 0909    Physical Exam:    VS:  LMP 09/16/2019  , BMI There is no height or weight on file to calculate BMI. GENERAL:  Well appearing HEENT: Pupils equal round and reactive, fundi not visualized, oral mucosa unremarkable NECK:  No jugular venous distention, waveform within normal limits, carotid upstroke brisk and symmetric, no bruits, no thyromegaly LYMPHATICS:  No cervical adenopathy LUNGS:  Clear to auscultation bilaterally HEART:  RRR.  PMI not displaced or sustained, S1 and S2 within normal limits, no S3, no S4, no clicks, no rubs, *** murmurs ABD:  Flat, positive bowel sounds normal in frequency in pitch, no bruits, no rebound, no guarding, no midline pulsatile mass, no hepatomegaly, no splenomegaly EXT:  2 plus pulses throughout, no edema, no cyanosis, no clubbing SKIN:  No rashes, no nodules NEURO:  Cranial nerves II through XII grossly intact, motor grossly intact throughout PSYCH:  Cognitively intact, oriented to person place and time   ASSESSMENT/PLAN:    No problem-specific Assessment & Plan notes found for this encounter.  ***Plan: -  Screening for Secondary  Hypertension: { Click here to document screening for secondary causes of HTN  :[id number]}    10/18/2022    1:41 PM  Causes  Drugs/Herbals Screened     - Comments No OTC, no excessive caffeine  Renovascular HTN Screened  Thyroid Disease Screened     - Comments hypothyroidism per PCP  Hyperaldosteronism Screened  Pheochromocytoma Screened  Coarctation of the Aorta Screened  Compliance Screened    Relevant Labs/Studies:    Latest Ref Rng & Units 01/20/2023    9:09 AM 07/20/2022   10:11 AM 04/20/2022  8:06 AM  Basic Labs  Sodium 134 - 144 mmol/L 141  142  141   Potassium 3.5 - 5.2 mmol/L 4.1  4.2  3.9   Creatinine 0.57 - 1.00 mg/dL 4.16  6.06  3.01        Latest Ref Rng & Units 01/20/2023    9:09 AM 07/20/2022   10:11 AM  Thyroid   TSH 0.450 - 4.500 uIU/mL 1.140  1.710        Latest Ref Rng & Units 10/26/2022    8:48 AM  Renin/Aldosterone   Aldosterone 0.0 - 30.0 ng/dL 6.7   Aldos/Renin Ratio 0.0 - 30.0 32.2        Latest Ref Rng & Units 10/26/2022    8:48 AM  Metanephrines/Catecholamines   Epinephrine 0 - 62 pg/mL <15   Norepinephrine 0 - 874 pg/mL 528   Dopamine 0 - 48 pg/mL <30   Metanephrines 0.0 - 88.0 pg/mL Comment:   Normetanephrines  0.0 - 244.0 pg/mL Comment:           11/23/2022    9:43 AM  Renovascular   Renal Artery [place] Completed Yes     she consents to be monitored in our remote patient monitoring program through [employer].  she will track his blood pressure twice daily and understands that these trends will help [place] to adjust her medications as needed prior to his next appointment.  she *** interested in enrolling in the PREP exercise and nutrition program through the [facility].    Disposition:    FU with APP/PharmD in 1 month for the next 3 months.   FU with Tiffany C. Duke Salvia, MD, [facility] in ***4 months.  Medication Adjustments/Labs and Tests Ordered: Current medicines are reviewed at length with the patient today.  Concerns regarding medicines are outlined above.    No orders of the defined types were placed in this encounter.  No orders of the defined types were placed in this encounter.  I,Mathew Stumpf,acting as a Neurosurgeon for Chilton Si, MD.,have documented all relevant documentation on the behalf of Chilton Si, MD,as directed by  Chilton Si, MD while in the presence of Chilton Si, MD.  ***  Signed, Carlena Bjornstad  02/15/2023 7:36 AM    [facility]

## 2023-02-15 NOTE — Progress Notes (Signed)
Patient appearing on report for [facility] - Hypertension Control report due to last documented ambulatory blood pressure of 133/96 on 02/09/23. Next appointment with PCP is 08/03/23   Outreached patient to discuss hypertension control and medication management.   Current antihypertensives: amlodipine 2.5 mg daily, valsartan 40 mg daily - reports she held this last night, she is testing to see her blood pressure readings   Patient has an automated upper arm home BP machine. Participating with [facility] team with Hypertension clinic. Most recent readings have been very well controlled.   Current blood pressure readings: at goal per [employer] team. Reports a most recent reading from this morning of 114/82.   Current meal patterns: continues to work on avoiding extra sodium  Current physical activity: walks regularly  Patient denies hypotensive signs and symptoms including dizziness, lightheadedness.  Patient denies hypertensive symptoms including headache, chest pain, shortness of breath.  Patient denies side effects related to     Assessment/Plan: - Currently controlled - - Reviewed goal blood pressure <130/80 - Reviewed appropriate home BP monitoring technique (avoid caffeine, smoking, and exercise for 30 minutes before checking, rest for at least 5 minutes before taking BP, sit with feet flat on the floor and back against a hard surface, uncross legs, and rest arm on flat surface) - Recommend to continue current regimen, along with focus on lifestyle modifications.   Catie Eppie Gibson, PharmD, BCACP, CPP Clinical Pharmacist [facility] [phone]

## 2023-02-17 ENCOUNTER — Encounter (HOSPITAL_BASED_OUTPATIENT_CLINIC_OR_DEPARTMENT_OTHER): Admitting: Cardiovascular Disease

## 2023-03-09 ENCOUNTER — Other Ambulatory Visit: Payer: Self-pay | Admitting: *Deleted

## 2023-03-09 ENCOUNTER — Ambulatory Visit
Admission: RE | Admit: 2023-03-09 | Discharge: 2023-03-09 | Disposition: A | Discharge status: Home / Self Care | Source: Ambulatory Visit | Attending: Adult Health | Admitting: Adult Health

## 2023-03-09 ENCOUNTER — Encounter: Payer: Self-pay | Admitting: Hematology and Oncology

## 2023-03-09 DIAGNOSIS — C50411 Malignant neoplasm of upper-outer quadrant of right female breast: Secondary | ICD-10-CM

## 2023-03-09 DIAGNOSIS — Z79811 Long term (current) use of aromatase inhibitors: Secondary | ICD-10-CM

## 2023-03-09 DIAGNOSIS — M8588 Other specified disorders of bone density and structure, other site: Secondary | ICD-10-CM

## 2023-03-10 ENCOUNTER — Ambulatory Visit (HOSPITAL_BASED_OUTPATIENT_CLINIC_OR_DEPARTMENT_OTHER)

## 2023-03-11 ENCOUNTER — Encounter: Attending: Cardiovascular Disease | Admitting: Dietician

## 2023-03-11 DIAGNOSIS — R7303 Prediabetes: Secondary | ICD-10-CM | POA: Insufficient documentation

## 2023-03-11 NOTE — Patient Instructions (Addendum)
1- Increase non starchy vegetables  Aim for half of a 9 inch plate 2-Continue your physical Activity Great job!

## 2023-03-11 NOTE — Progress Notes (Signed)
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 Antony Blackbird   Hypertension    Hyperthyroidism    in the past   Hypothyroidism    Iron deficiency anemia    Palpitation    Personal history of chemotherapy    Personal history of radiation therapy    Spontaneous ecchymoses    Tachycardia    Vitamin D deficiency     Medications:  Current Outpatient Medications:    amLODipine (NORVASC) 2.5 MG tablet, Take 1 tablet (2.5 mg total) by mouth daily., Disp: 90 tablet, Rfl: 3   anastrozole (ARIMIDEX) 1 MG tablet, Take 1 tablet (1 mg total) by mouth daily., Disp: 90 tablet, Rfl: 1   Cholecalciferol (VITAMIN D-3 PO), Take 5,000 Units by mouth daily., Disp: , Rfl:    MAGNESIUM GLYCINATE PO, Take by mouth., Disp: , Rfl:    SYNTHROID 75 MCG tablet, TAKE 1 TABLET DAILY BEFORE BREAKFAST, Disp: 90 tablet, Rfl: 3   valsartan (DIOVAN) 80 MG tablet, Take 1 tablet (80 mg total) by mouth daily. (Patient taking differently: Take 80 mg by mouth daily. Patient reports HTN clinic has her take 1/2. 40MG .), Disp: , Rfl:    vitamin B-12 (CYANOCOBALAMIN) 1000 MCG tablet, Take 1,000 mcg by mouth daily., Disp: , Rfl:   Labs:  Lab Results  Component Value Date   HGBA1C 6.3 (H) 01/20/2023   BP Readings from Last 3 Encounters:  02/15/23 114/82  02/09/23 (!) 133/96  01/20/23 (!) 138/110   Lab Results  Component Value Date   CHOL 183 01/20/2023   HDL  60 01/20/2023   LDLCALC 109 (H) 01/20/2023   TRIG 78 01/20/2023   CHOLHDL 3.1 01/20/2023   Last vitamin D Lab Results  Component Value Date   VD25OH 62.7 07/20/2022    Lab Results  Component Value Date   VITAMINB12 345 12/05/2019    Lifestyle & Dietary Hx Pt would like to learn more about dietary and lifestyle strategies to improve her health and decrease the need for medications. Pt report she is going to HTN clinic and checking BP BID with recent values readings of "110-60-139/100". Pt reports she has made dietary changes including avoiding red meat, fast foods, fried foods and follows a no added salt meal pattern.  Estimated daily fluid intake: 84-125 oz Supplements: probio5, bio-cleanse, magnesium  Sleep: interrupted 8 hours Stress / self-care: 5 of out 10 / exercise, quilting Current average weekly physical activity:  lifting weights with a trainer in a class 4-5/d/w , spin 45 minutes 2/d/w, walking 1 hour 3/d/w, softball   24-Hr Dietary Recall First Meal: home made shake: plant based 2 scoops protein, 1 scoop of collagen, 1/2 avocado, 2 cups berries, 2 cup of almond milk unsweet, 2 ounces of spinach or 2 eggs, 1/4 uncooked portions grits, 2 slices bacon, 1 whole wheat toast, teaspoon with sweet potato butter Snack: none Second Meal: apple with almond butter or sometimes skips lunch Snack: none Third Meal: small pizza home  made, onions, bell peppers, olives, cheese, banana peppers, water Snack: none Beverages: water, orange juice, Izzie   NUTRITION DIAGNOSIS  NB-1.1 Food and nutrition-related knowledge deficit As related to no prior nutrition related education.  As evidenced by Pt report and dietary recall.   NUTRITION INTERVENTION  Nutrition education (E-1) on the following topics:  Fruits & Vegetables: Aim to fill half your plate with a variety of fruits and vegetables. They are rich in vitamins, minerals, and fiber, and can help reduce the risk of chronic diseases.  Choose a colorful assortment of fruits and vegetables to ensure you get a wide range of nutrients. Grains and Starches: Make at least half of your grain choices whole grains, such as brown rice, whole wheat bread, and oats. Whole grains provide fiber, which aids in digestion and healthy cholesterol levels. Aim for whole forms of starchy vegetables such as potatoes, sweet potatoes, beans, peas, and corn, which are fiber rich and provide many vitamins and minerals.  Protein: Incorporate lean sources of protein, such as poultry, fish, beans, nuts, and seeds, into your meals. Protein is essential for building and repairing tissues, staying full, balancing blood sugar, as well as supporting immune function. Dairy: Include low-fat or fat-free dairy products like milk, yogurt, and cheese in your diet. Dairy foods are excellent sources of calcium and vitamin D, which are crucial for bone health.  Physical Activity: Aim for 60 minutes of physical activity daily. Regular physical activity promotes overall health-including helping to reduce risk for heart disease and diabetes, promoting mental health, and helping [place] sleep better.   Handouts Provided Include  Plate Planner Nutrition Label Prediabetes Info Sheet Food List  Learning Style & Readiness for Change Teaching method utilized: Visual & Auditory  Demonstrated degree of understanding via: Teach Back  Barriers to learning/adherence to lifestyle change: none identified  Goals Established by Pt Increase non starchy vegetable intake    MONITORING & EVALUATION Dietary intake, weekly physical activity  Next Steps  Patient is to return PRN.

## 2023-03-12 ENCOUNTER — Ambulatory Visit

## 2023-03-12 DIAGNOSIS — Z0000 Encounter for general adult medical examination without abnormal findings: Secondary | ICD-10-CM

## 2023-03-12 NOTE — Progress Notes (Signed)
Appointment Outcome: Completed, Session #: 1 month f/u                        Start time: 9:00am   End time: 9:40am   Total Mins: 40 minutes  AGREEMENTS SECTION   Overall Goal(s): Reduce blood pressure to <=130/80 over the next three months Reduce A1c over the next three months     Agreement/Action Steps:  Monitor sodium and sugar intake by reading food labels Aim for 1500 mg of sodium per day Aim for 25 g of sugar per day  Practice portion control per recommended serving size (or less) on food label Log food using Fitbit    Progress Notes:  Patient stated that things are going well 90% of the time but does not have any real struggles with any particular attention steps. Patient stated that she has continued to monitor her sodium and sugar intake by reading food labels. Patient stated that the last time she had to travel for work she packed her own food items for meals so that she could control what she was eating compared to other times when she had to make the best choices out of what was available.   Patient stated that she completed a 10 day no added sugar challenge where she did not have any sugar during those days. Patient shared that she has had a few sweets since then that included ice cream, sweet potato pie, and mini candies. Patient shared that although she has had sweets, she is not craving sweets. Patient mentioned that she decided to make her own sweet potato butter instead of using jam for toast. Patient also started making her own sweet treats that included almond butter, apples, and cacao nibs.   Patient reported that she has ensured to include an adequate amount of protein in her diet to stabilize her after completing her weight resistance training that she started on September 16th. Patient shared that she noticed a difference when she ran out of her protein powder that she used for a supplement that aid in her overall fitness.  Patient mentioned that she had a visit  with a nutritionist yesterday and learned some additional information that she found helpful with practicing portion control such as using a 9-inch plate and how to divide your plate appropriately. Patient stated that she needs work on getting more non starchy vegetables in her diet since 1/2 the plate should consist of this food. Patient shared that she was given the Diabetes Food Hub and [employer] websites as resources for improving eating habits. Patient stated that she learned about the number of grams of high fiber carbohydrates she should consume per day and how to determine a serving size (cooked vs uncooked).     Indicators of Success and Accountability:  Patient has been able to maintain her action steps to lower her blood pressure and A1c over the past month. Patient's average blood pressure for the past two weeks is 126/87.  Readiness: Patient is in the action stage of improving her eating habits by monitoring her sodium and sugar intake to achieve their goals.   Strengths and Supports: Patient is relying on being consistent and being self-aware.   Challenges and Barriers: Patient does not foresee any challenges with implementing her action steps over the next two months.     Coaching Outcomes: Patient revised her action steps based on information that she received from her nutritionist yesterday. These steps include practicing portion control  using a smaller plate, dividing her plate using the diabetes plate method (per Diabetes Food Hub), and increase her intake of high fiber carbohydrates.   Overall Goal(s): Reduce blood pressure to <=130/80 over the next three months Reduce A1c over the next three months     Agreement/Action Steps:  Monitor sodium and sugar intake by reading food labels Aim for 1500 mg of sodium per day Aim for 25 g of sugar per day  Practice portion control per recommended serving size (or less) on food label Use 9" plate  non-starchy vegetables  lean  protein  carbohydrate Increase intake of high fiber carbohydrates 30-45 grams per meal 15 grams per snack   Attempted: Fulfilled - patient completed the monthly agreement in full and was able to meet the challenge.   Resources: Sent patient an outline of her revised action steps and the link to the Diabetes Plate Method - [email or web address].

## 2023-03-20 ENCOUNTER — Ambulatory Visit
Admission: RE | Admit: 2023-03-20 | Discharge: 2023-03-20 | Disposition: A | Discharge status: Home / Self Care | Source: Ambulatory Visit | Attending: Adult Health | Admitting: Adult Health

## 2023-03-20 DIAGNOSIS — C50411 Malignant neoplasm of upper-outer quadrant of right female breast: Secondary | ICD-10-CM

## 2023-03-31 ENCOUNTER — Ambulatory Visit (HOSPITAL_BASED_OUTPATIENT_CLINIC_OR_DEPARTMENT_OTHER): Admitting: Cardiovascular Disease

## 2023-03-31 ENCOUNTER — Encounter (HOSPITAL_BASED_OUTPATIENT_CLINIC_OR_DEPARTMENT_OTHER): Payer: Self-pay | Admitting: Cardiovascular Disease

## 2023-03-31 VITALS — BP 131/85 | HR 52 | Ht 68.5 in | Wt 192.0 lb

## 2023-03-31 DIAGNOSIS — I10 Essential (primary) hypertension: Secondary | ICD-10-CM | POA: Diagnosis not present

## 2023-03-31 NOTE — Progress Notes (Signed)
Advanced Hypertension Clinic Initial Assessment:    Date:  03/31/2023   ID:  Shannon Kane, DOB 23-Dec-1970, MRN [id number]  PCP:  Shannon Peng, MD  Cardiologist:  Shannon Si, MD  Nephrologist:  Referring MD: Shannon Peng, MD   CC: Hypertension  History of Present Illness:    Shannon Kane is a 52 y.o. female with a hx of hypertension, breast cancer, diabetes, and hypothyroidism here for follow-up.  She had first established in the Advanced Hypertension Clinic 09/2022.  She reported being diagnosed with hypertension in her 54s.  She attributed to work stress.  At the time of her initial visit her blood pressure was averaging in the 110s to 140s over 70s to 80s.  Labs were negative for hyperaldosteronism and pheochromocytoma.  Renal Dopplers and carotid Dopplers were both unremarkable 11/2022.  Prior echo 07/2021 revealed LVEF 60-65% with grade 1 diastolic dysfunction.  She was enrolled in our remote patient monitoring study and average blood pressure was 121/82 at the time of follow-up 11/2022.  She expressed a desire to stop her blood pressure medications.  They reduced valsartan to 80 mg.  Recent blood pressures have ranged from 110s to 150/160s to 90s.  She is averaging in the 120s to 130s over 80s to 90s.  Shannon Kane presents with concerns about fluctuating blood pressure readings. Despite a rigorous exercise regimen, including weightlifting and cardio, and a healthy diet, the patient reports persistent hypertension. The patient has been monitoring her blood pressure at home, noting readings that range from 110 to 150 systolic.  On average her BP is in the 120s/70s.  The patient denies any significant stressors.   Shannon Kane has a history of breast cancer and is currently on anastrozole, a medication known to potentially increase LDL levels and blood pressure. The patient has stopped taking valsartan, a medication previously prescribed for hypertension and is currently only on amlodipine.  The patient expresses a strong preference for non-pharmacological interventions and a desire to understand the root cause of her hypertension.  The patient also reports a recent hip injury sustained during a softball game, which has temporarily affected her exercise routine. The patient has been experiencing discomfort in the hip area, which she initially thought was a groin injury. Despite this, the patient remains committed to maintaining an active lifestyle.  The patient has been proactive in managing her health, participating in a 10-day no-sugar challenge and regularly tracking her blood pressure and cholesterol levels.     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 Antony Blackbird   Hypertension    Hyperthyroidism    in the past   Hypothyroidism    Iron deficiency anemia    Palpitation    Personal history of chemotherapy    Personal history of radiation therapy    Spontaneous ecchymoses    Tachycardia    Vitamin D deficiency     Past Surgical History:  Procedure Laterality Date   BREAST BIOPSY     BREAST EXCISIONAL BIOPSY     BREAST LUMPECTOMY Right 08/20/2020   w/ rad w/ chemo   BREAST LUMPECTOMY WITH RADIOACTIVE SEED LOCALIZATION Right 08/20/2020   Procedure: RADIOACTIVE SEED GUIDED RIGHT BREAST LUMPECTOMY;  Surgeon: Abigail Miyamoto, MD;  Location: [facility] OR;  Service: General;  Laterality: Right;   CHOLECYSTECTOMY     COLONOSCOPY  05/18/2018   KNEE SURGERY     PORT-A-CATH REMOVAL N/A 04/30/2021   Procedure: REMOVAL PORT-A-CATH;  Surgeon: Abigail Miyamoto, MD;  Location: [facility];  Service: General;  Laterality: N/A;   PORTACATH PLACEMENT Left 04/15/2020   Procedure: INSERTION PORT-A-CATH;   Surgeon: Abigail Miyamoto, MD;  Location: [facility];  Service: General;  Laterality: Left;   RADIOACTIVE SEED GUIDED AXILLARY SENTINEL LYMPH NODE Right 08/20/2020   Procedure: RADIOACTIVE SEED TARGETED RIGHT AXILLARY LYMPH NODE DISSECTION;  Surgeon: Abigail Miyamoto, MD;  Location: [facility] OR;  Service: General;  Laterality: Right;   TONSILLECTOMY      Current Medications: No outpatient medications have been marked as taking for the 03/31/23 encounter (Appointment) with Shannon Si, MD.     Allergies:   Amoxicillin, Ciprofloxacin, and Penicillins   Social History   Socioeconomic History   Marital status: Married    Spouse name: Not on file   Number of children: Not on file   Years of education: Not on file   Highest education level: Not on file  Occupational History   Not on file  Tobacco Use   Smoking status: Never   Smokeless tobacco: Never  Vaping Use   Vaping status: Never Used  Substance and Sexual Activity   Alcohol use: Yes    Alcohol/week: 0.0 standard drinks of alcohol    Comment: social   Drug use: Never   Sexual activity: Not Currently    Birth control/protection: None  Other Topics Concern   Not on file  Social History Narrative   Not on file   Social Determinants of Health   Financial Resource Strain: Low Risk  (10/15/2022)   Overall Financial Resource Strain (CARDIA)    Difficulty of Paying Living Expenses: Not hard at all  Food Insecurity: No Food Insecurity (03/11/2023)   Hunger Vital Sign    Worried About Running Out of Food in the Last Year: Never true    Ran Out of Food in the Last Year: Never true  Transportation Needs: No Transportation Needs (04/03/2020)   PRAPARE - Administrator, Civil Service (Medical): No    Lack of Transportation (Non-Medical): No  Physical Activity: Sufficiently Active (10/15/2022)   Exercise Vital Sign    Days of Exercise per Week: 6 days    Minutes of Exercise per Session: 30 min   Stress: No Stress Concern Present (10/15/2022)   [employer] of Occupational Health - Occupational Stress Questionnaire    Feeling of Stress : Only a [place]  Social Connections: Socially Integrated (10/15/2022)   Social Connection and Isolation Panel [NHANES]    Frequency of Communication with Friends and Family: More than three times a week    Frequency of Social Gatherings with Friends and Family: Three times a week    Attends Religious Services: More than 4 times per year    Active Member of Clubs or Organizations: Yes    Attends Engineer, structural: More than 4 times per year    Marital Status: Married     Family History: The patient's family history includes Bone cancer in her paternal grandfather; Breast cancer in an other family member; Cancer in her maternal uncle and maternal uncle; Diabetes in her mother; Hypertension in her mother; Leukemia in her father; Ovarian cancer (age of onset: 52) in her maternal grandmother.  ROS:   Please see  the history of present illness.     All other systems reviewed and are negative.  EKGs/Labs/Other Studies Reviewed:     Recent Labs: 07/20/2022: Hemoglobin 13.7; Platelets 241 01/20/2023: ALT 22; BUN 8; Creatinine, Ser 0.90; Potassium 4.1; Sodium 141; TSH 1.140   Recent Lipid Panel    Component Value Date/Time   CHOL 183 01/20/2023 0909   TRIG 78 01/20/2023 0909   HDL 60 01/20/2023 0909   CHOLHDL 3.1 01/20/2023 0909   CHOLHDL 4.3 04/20/2022 0805   VLDL 13 04/20/2022 0805   LDLCALC 109 (H) 01/20/2023 0909    Physical Exam:   VS:  LMP 09/16/2019  , BMI There is no height or weight on file to calculate BMI. GENERAL:  Well appearing HEENT: Pupils equal round and reactive, fundi not visualized, oral mucosa unremarkable NECK:  No jugular venous distention, waveform within normal limits, carotid upstroke brisk and symmetric, no bruits, no thyromegaly LUNGS:  Clear to auscultation bilaterally HEART:  RRR.  PMI not displaced  or sustained,S1 and S2 within normal limits, no S3, no S4, no clicks, no rubs, no murmurs ABD:  Flat, positive bowel sounds normal in frequency in pitch, no bruits, no rebound, no guarding, no midline pulsatile mass, no hepatomegaly, no splenomegaly EXT:  2 plus pulses throughout, no edema, no cyanosis no clubbing SKIN:  No rashes no nodules NEURO:  Cranial nerves II through XII grossly intact, motor grossly intact throughout PSYCH:  Cognitively intact, oriented to person place and time   ASSESSMENT/PLAN:    # Hypertension Despite an active lifestyle and good diet, blood pressure remains elevated. Patient has stopped taking Valsartan and is currently only on Amlodipine. Blood pressure readings are variable but average around 120s/70s. Anastrozole, which patient is currently taking, may be contributing to hypertension. -Continue Amlodipine. -Remove Valsartan from her med list. -Monitor blood pressure at home and report if average readings exceed 130/80. -Consider potential for blood pressure improvement after discontinuation of Anastrozole.  # Hyperlipidemia LDL and triglycerides have improved over the past year. No current indication for statin therapy. ASCVD 10 year risk is 4.2%.  Patient is currently taking Anastrozole, which may be contributing to elevated LDL. -No need for statin therapy at this time. -Consider potential for lipid profile improvement after discontinuation of Anastrozole. -We will get a coronary calcium score to better understand her individual risk.  Cardiovascular Risk Assessment Despite hypertension and hyperlipidemia, 10-year risk for heart attack or stroke is 4.2%, which is below the threshold for statin therapy. However, age is a risk factor for increasing blood pressure and cardiovascular disease. -Schedule coronary calcium score to provide more individualized risk assessment. -Continue current lifestyle modifications including diet and exercise. -Follow-up in 6  months or sooner if blood pressure trends upward.      Screening for Secondary Hypertension:      10/18/2022    1:41 PM  Causes  Drugs/Herbals Screened     - Comments No OTC, no excessive caffeine  Renovascular HTN Screened  Thyroid Disease Screened     - Comments hypothyroidism per PCP  Hyperaldosteronism Screened  Pheochromocytoma Screened  Coarctation of the Aorta Screened  Compliance Screened    Relevant Labs/Studies:    Latest Ref Rng & Units 01/20/2023    9:09 AM 07/20/2022   10:11 AM 04/20/2022    8:06 AM  Basic Labs  Sodium 134 - 144 mmol/L 141  142  141   Potassium 3.5 - 5.2 mmol/L 4.1  4.2  3.9   Creatinine 0.57 -  1.00 mg/dL 1.61  0.96  0.45        Latest Ref Rng & Units 01/20/2023    9:09 AM 07/20/2022   10:11 AM  Thyroid   TSH 0.450 - 4.500 uIU/mL 1.140  1.710        Latest Ref Rng & Units 10/26/2022    8:48 AM  Renin/Aldosterone   Aldosterone 0.0 - 30.0 ng/dL 6.7   Aldos/Renin Ratio 0.0 - 30.0 32.2        Latest Ref Rng & Units 10/26/2022    8:48 AM  Metanephrines/Catecholamines   Epinephrine 0 - 62 pg/mL <15   Norepinephrine 0 - 874 pg/mL 528   Dopamine 0 - 48 pg/mL <30   Metanephrines 0.0 - 88.0 pg/mL Comment:   Normetanephrines  0.0 - 244.0 pg/mL Comment:           11/23/2022    9:43 AM  Renovascular   Renal Artery [place] Completed Yes     Disposition:    FU with MD/PharmD in 6 months    Medication Adjustments/Labs and Tests Ordered: Current medicines are reviewed at length with the patient today.  Concerns regarding medicines are outlined above.  No orders of the defined types were placed in this encounter.  No orders of the defined types were placed in this encounter.   Signed, Shannon Si, MD  03/31/2023 1:16 PM    [facility]

## 2023-03-31 NOTE — Patient Instructions (Addendum)
Medication Instructions:  Your physician recommends that you continue on your current medications as directed. Please refer to the Current Medication list given to you today.   Labwork: NONE  Testing/Procedures: CALCIUM SCORE - $99 OUT OF POCKET   Follow-Up: 6 MONTHS WITH DR [place] OR CAITLIN W NP   If you need a refill on your cardiac medications before your next appointment, please call your pharmacy.

## 2023-04-05 ENCOUNTER — Encounter

## 2023-04-14 ENCOUNTER — Ambulatory Visit (HOSPITAL_BASED_OUTPATIENT_CLINIC_OR_DEPARTMENT_OTHER)
Admission: RE | Admit: 2023-04-14 | Discharge: 2023-04-14 | Disposition: A | Discharge status: Home / Self Care | Source: Ambulatory Visit | Attending: Cardiovascular Disease | Admitting: Cardiovascular Disease

## 2023-04-14 DIAGNOSIS — I10 Essential (primary) hypertension: Secondary | ICD-10-CM

## 2023-04-18 ENCOUNTER — Other Ambulatory Visit: Payer: Self-pay | Admitting: Adult Health

## 2023-04-18 ENCOUNTER — Other Ambulatory Visit (INDEPENDENT_AMBULATORY_CARE_PROVIDER_SITE_OTHER): Payer: Self-pay

## 2023-04-18 ENCOUNTER — Other Ambulatory Visit: Payer: Self-pay | Admitting: Internal Medicine

## 2023-04-18 DIAGNOSIS — I10 Essential (primary) hypertension: Secondary | ICD-10-CM

## 2023-04-19 NOTE — Telephone Encounter (Signed)
Does she needs cholesterol levels drawn before we refill her anastrazole?  Last OV said to monitor cholesterol as the anastrazole could be increasing that lab value. Next appt is Sept 2025.  Lorayne Marek, RN

## 2023-04-20 ENCOUNTER — Encounter: Payer: Self-pay | Admitting: *Deleted

## 2023-04-20 NOTE — Progress Notes (Unsigned)
Per NP, pt needing fasting lab work with CMP and Lipid panel done 1 week prior to September 2025 visit to evaluate cholesterol while on AI therapy.  Orders placed, message sent to scheduling team.

## 2023-04-28 ENCOUNTER — Encounter (HOSPITAL_BASED_OUTPATIENT_CLINIC_OR_DEPARTMENT_OTHER): Admitting: Cardiovascular Disease

## 2023-05-10 ENCOUNTER — Ambulatory Visit: Attending: Cardiology

## 2023-05-10 DIAGNOSIS — Z0000 Encounter for general adult medical examination without abnormal findings: Secondary | ICD-10-CM

## 2023-05-10 NOTE — Progress Notes (Signed)
Appointment Outcome: Completed, Session #: 66-month f/u                        Start time: 9:00am   End time: 9:31am   Total Mins: 31 minutes  AGREEMENTS SECTION   Overall Goal(s): Reduce blood pressure to <=130/80 over the next three months Reduce A1c over the next three months     Agreement/Action Steps:  Monitor sodium and sugar intake by reading food labels Aim for 1500 mg of sodium per day Aim for 25 g of sugar per day  Practice portion control per recommended serving size (or less) on food label Use 9" plate  non-starchy vegetables  lean protein  carbohydrate Increase intake of high fiber carbohydrates 30-45 grams per meal 15 grams per snack    Progress Notes:  Patient reported that working on her goals has been going well over the past two months. Patient shared that she has continued to exercise, and her weekly routine is (6 days exercise, 1 day rest). Patient is participating in a spin class twice weekly, lifting weights 3 days per week, and step class once weekly. Patient shared that she has been able to maintain engaging in 150 minutes of exercise weekly and have accountability partners at the gym.  Patient expressed that her concern in relation to exercising is knowing if she is consuming enough carbohydrates on the days that she lifts weights to help sustain her energy and recovery. Patient shared that using the 9" plate helped her visualize that she was not consuming enough non-starchy vegetables that she has been able to increase consumption of over the past 2 months.   Patient mentioned that she has been working on consuming adequate amounts of fiber dense carbohydrates by eating whole foods. Patient shared that she has been ensuring that she does not consume more than 25 grams of added sugar by refraining from eating foods with added sugar. Patient stated that she made a paste made from dates to sweeten her pecan pie instead of using a cup of sugar per pie. Patient  stated that making such changes allows her to reducing sugar consumption and increase fiber. Patient shared that she is interested to know what foods are high in fiber.  Patient stated that she has been able to monitor her sodium intake by consuming the same foods regularly. Patient reported that she was able to refrain from consuming a lot of sodium during Thanksgiving by not adding salted or greasy foods to her vegetables. Patient reported not having concerns with practicing portion control with implementing the diabetes plate method and with knowledge of recommended serving sizes.   Indicators of Success and Accountability:  Patient has been able to discontinue a medication since the last health coaching appointment.  Readiness: Patient is in the action stage of reducing her blood pressure and A1c.  Strengths and Supports: Patient is relying on being knowledgeable. Patient is being supported by her exercise accountability partners with her healthy eating habits.  Challenges and Barriers: Patient stated that she is going on a cruise after Christmas but have thought of ways to work around any challenges with continuing implementing her action steps to maintain her healthy eating habits.   Coaching Outcomes: Patient will continue to implement her action steps as outlined above over the next three months.   Patient stated that while on a cruise after Christmas, she plans to take her exercise equipment to continue exercising and implement her action steps to  maintain healthy eating habits by making the healthiest food choices.  Patient was informed that her 2-week average blood pressure recently was 127/81. Patient is in a good range of her goal bp per [employer].  Patient has a physical exam on 3/18 and is expecting to have her A1c checked during that visit.   Patient was provided information on fiber content in foods per Epic.  Attempted: Fulfilled - Patient completed the 59-month agreement in  full and was able to meet the challenge

## 2023-05-19 ENCOUNTER — Other Ambulatory Visit (INDEPENDENT_AMBULATORY_CARE_PROVIDER_SITE_OTHER): Payer: Self-pay

## 2023-06-19 ENCOUNTER — Other Ambulatory Visit (INDEPENDENT_AMBULATORY_CARE_PROVIDER_SITE_OTHER): Payer: Self-pay

## 2023-06-28 ENCOUNTER — Ambulatory Visit: Attending: Hematology and Oncology

## 2023-06-28 DIAGNOSIS — Z483 Aftercare following surgery for neoplasm: Secondary | ICD-10-CM | POA: Insufficient documentation

## 2023-07-17 ENCOUNTER — Other Ambulatory Visit (INDEPENDENT_AMBULATORY_CARE_PROVIDER_SITE_OTHER): Payer: Self-pay

## 2023-07-27 ENCOUNTER — Encounter: Admitting: Internal Medicine

## 2023-07-28 ENCOUNTER — Telehealth (HOSPITAL_BASED_OUTPATIENT_CLINIC_OR_DEPARTMENT_OTHER): Payer: Self-pay | Admitting: *Deleted

## 2023-07-28 DIAGNOSIS — Z006 Encounter for examination for normal comparison and control in clinical research program: Secondary | ICD-10-CM

## 2023-07-28 NOTE — Telephone Encounter (Signed)
 Spoke with patient who stated her facial clinician told her with the amount of water she is drinking her cells are not properly absorbing water.  She wanted to know if it would effect her blood pressure.  Drinks at least 100 ounces of water a day  Was told by clinician to start taking magnesium, patient already does.  Was also told to put celtic salt in mouth and let dissolve.  Facial skin is very oily Does workout daily and sweats a lot. Has a lot of sweating with her hot flashes as well Recent blood pressure readings under media tab, been using [employer]  Doesn't like being on blood pressure medications but will continue  Getting ready to do a raw fruits and vegetable diet for 2 weeks  Patient stated she is certainly not taking what the clinician said as medical advise but is following up as recommended.  Would like thoughts on this information   Will forward to Ronn Melena NP for review

## 2023-07-29 NOTE — Telephone Encounter (Signed)
 100 oz of water per day is sufficient. Magnesium is safe to take. Would limit salt as this can elevate blood pressure. Glad blood pressure has been well controlled. As blood pressure controlled, would continue current medications.   Alver Sorrow, NP

## 2023-07-29 NOTE — Research (Signed)
 Pt is enrolled in Dr. Leonides Sake HTN Virtual Trial in Group 2. Pt was marked complete on 28-Jul-2023. Pt's Cantril's Ladder and follow up survey was done over the phone. Pt's survey is charted in [employer].    Cantril's Ladder  Please imagine a ladder with steps numbered from zero at the bottom to ten at the top. The top of the ladder represents the best possible life for you and the bottom of the ladder represents the worst possible life for you.    Indicate on the ladder where you feel you personally stand right now: 7   Indicate on the ladder where you feel you will stand in 5 years: 10

## 2023-08-03 ENCOUNTER — Encounter: Payer: Self-pay | Admitting: Internal Medicine

## 2023-08-03 ENCOUNTER — Ambulatory Visit (INDEPENDENT_AMBULATORY_CARE_PROVIDER_SITE_OTHER): Payer: Self-pay | Admitting: Internal Medicine

## 2023-08-03 VITALS — BP 124/70 | HR 93 | Temp 97.7°F | Ht 68.0 in | Wt 196.2 lb

## 2023-08-03 DIAGNOSIS — I10 Essential (primary) hypertension: Secondary | ICD-10-CM

## 2023-08-03 DIAGNOSIS — Z23 Encounter for immunization: Secondary | ICD-10-CM

## 2023-08-03 DIAGNOSIS — C50411 Malignant neoplasm of upper-outer quadrant of right female breast: Secondary | ICD-10-CM

## 2023-08-03 DIAGNOSIS — R7309 Other abnormal glucose: Secondary | ICD-10-CM | POA: Diagnosis not present

## 2023-08-03 DIAGNOSIS — E039 Hypothyroidism, unspecified: Secondary | ICD-10-CM

## 2023-08-03 DIAGNOSIS — Z171 Estrogen receptor negative status [ER-]: Secondary | ICD-10-CM

## 2023-08-03 DIAGNOSIS — Z0000 Encounter for general adult medical examination without abnormal findings: Secondary | ICD-10-CM | POA: Diagnosis not present

## 2023-08-03 LAB — POCT URINALYSIS DIPSTICK
Bilirubin, UA: NEGATIVE
Glucose, UA: NEGATIVE
Ketones, UA: NEGATIVE
Nitrite, UA: NEGATIVE
Protein, UA: NEGATIVE
Spec Grav, UA: 1.01 (ref 1.010–1.025)
Urobilinogen, UA: 0.2 U/dL
pH, UA: 6 (ref 5.0–8.0)

## 2023-08-03 NOTE — Assessment & Plan Note (Signed)

## 2023-08-03 NOTE — Assessment & Plan Note (Addendum)
 Chronic, currently on Synthroid daily. I will check thyroid function and adjust meds as needed. If there are no changes today, will f/u in six months.

## 2023-08-03 NOTE — Assessment & Plan Note (Signed)
 Chronic, controlled.  EKG performed, NSR w/ occasional PACs.  She will continue with amlodipine 2.5mg  daily.  She is reminded to follow a low sodium diet.  She is now followed by HTN clinic, their input is appreciated.  She will continue with current meds and f/u in six months.

## 2023-08-03 NOTE — Assessment & Plan Note (Signed)
 Initially diagnosed in 2021.  She is s/p right lumpectomy, neoadjuvant chemotherapy, XRT and letrozole. She was later switched to anastrozole due to side effects. Has upcoming appt with Dr. Pamelia Hoit Sept 2024.

## 2023-08-03 NOTE — Progress Notes (Signed)
 I,Victoria T Deloria Lair, CMA,acting as a Neurosurgeon for Gwynneth Aliment, MD.,have documented all relevant documentation on the behalf of Gwynneth Aliment, MD,as directed by  Gwynneth Aliment, MD while in the presence of Gwynneth Aliment, MD.  Subjective:    Patient ID: Shannon Kane , female    DOB: 08-May-1971 , 53 y.o.   MRN: [id number]  Chief Complaint  Patient presents with   Annual Exam   Hypertension   Hypothyroidism    HPI  She is here today for a full physical examination.  Patient reports compliance with medications and has no other concerns today. Patient is established with GYN located in [place]. She denies having any headaches, chest pain and shortness of breath.       Hypertension This is a chronic problem. The current episode started more than 1 year ago. The problem has been gradually improving since onset. The problem is controlled. Pertinent negatives include no blurred vision, chest pain, headaches or palpitations. Risk factors for coronary artery disease include post-menopausal state. Past treatments include angiotensin blockers. The current treatment provides moderate improvement. There are no compliance problems.      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 Antony Blackbird   Hypertension    Hyperthyroidism    in the past   Hypothyroidism    Iron deficiency anemia    Palpitation    Personal history of chemotherapy    Personal history of radiation therapy    Spontaneous ecchymoses    Tachycardia    Vitamin D deficiency      Family History  Problem Relation Age of Onset   Diabetes Mother    Hypertension Mother    Leukemia Father        dx > 50   Ovarian cancer Maternal Grandmother 11   Bone cancer Paternal Grandfather        dx  unknown age   Cancer Maternal Uncle        Mouth and throad; dx > 50   Breast cancer Other        MGM's niece, dx unknown age   Cancer Maternal Uncle        unknown type; dx > 50     Current Outpatient Medications:    amLODipine (NORVASC) 2.5 MG tablet, TAKE 1 TABLET DAILY, Disp: 90 tablet, Rfl: 3   anastrozole (ARIMIDEX) 1 MG tablet, TAKE 1 TABLET DAILY, Disp: 90 tablet, Rfl: 3   Cholecalciferol (VITAMIN D-3 PO), Take 5,000 Units by mouth daily., Disp: , Rfl:    MAGNESIUM GLYCINATE PO, Take by mouth., Disp: , Rfl:    SYNTHROID 75 MCG tablet, TAKE 1 TABLET DAILY BEFORE BREAKFAST, Disp: 90 tablet, Rfl: 3   vitamin B-12 (CYANOCOBALAMIN) 1000 MCG tablet, Take 1,000 mcg by mouth daily., Disp: , Rfl:    Allergies  Allergen Reactions   Amoxicillin     Tolerates ANCEF   Ciprofloxacin Itching   Penicillins Hives    Tolerates ANCEF       The patient states she uses post menopausal status for birth control. Patient's last menstrual period was 09/16/2019.. Negative for Dysmenorrhea. Negative for: breast discharge, breast lump(s), breast pain and breast self exam. Associated symptoms include abnormal vaginal bleeding. Pertinent negatives include abnormal  bleeding (hematology), anxiety, decreased libido, depression, difficulty falling sleep, dyspareunia, history of infertility, nocturia, sexual dysfunction, sleep disturbances, urinary incontinence, urinary urgency, vaginal discharge and vaginal itching. Diet regular.The patient states her exercise level is  moderate/intense.   . The patient's tobacco use is:  Social History   Tobacco Use  Smoking Status Never  Smokeless Tobacco Never  . She has been exposed to passive smoke. The patient's alcohol use is:  Social History   Substance and Sexual Activity  Alcohol Use Yes   Alcohol/week: 0.0 standard drinks of alcohol   Comment: social    Review of Systems  Constitutional: Negative.   HENT: Negative.    Eyes: Negative.  Negative for  blurred vision.  Respiratory: Negative.    Cardiovascular: Negative.  Negative for chest pain and palpitations.  Gastrointestinal: Negative.   Endocrine: Negative.   Genitourinary: Negative.   Musculoskeletal: Negative.   Skin: Negative.   Allergic/Immunologic: Negative.   Neurological: Negative.  Negative for headaches.  Hematological: Negative.   Psychiatric/Behavioral: Negative.       Today's Vitals   08/03/23 0840  BP: 124/70  Pulse: 93  Temp: 97.7 F (36.5 C)  SpO2: 98%  Weight: 196 lb 3.2 oz (89 kg)  Height: 5\' 8"  (1.727 m)   Body mass index is 29.83 kg/m.  Wt Readings from Last 3 Encounters:  08/03/23 196 lb 3.2 oz (89 kg)  03/31/23 192 lb (87.1 kg)  02/09/23 193 lb 11.2 oz (87.9 kg)     Objective:  Physical Exam Vitals and nursing note reviewed.  Constitutional:      Appearance: Normal appearance.  HENT:     Head: Normocephalic and atraumatic.     Right Ear: Tympanic membrane, ear canal and external ear normal.     Left Ear: Tympanic membrane, ear canal and external ear normal.     Mouth/Throat:     Pharynx: No oropharyngeal exudate or posterior oropharyngeal erythema.  Eyes:     Extraocular Movements: Extraocular movements intact.     Conjunctiva/sclera: Conjunctivae normal.     Pupils: Pupils are equal, round, and reactive to light.  Cardiovascular:     Rate and Rhythm: Normal rate and regular rhythm.     Pulses: Normal pulses.     Heart sounds: Normal heart sounds.  Pulmonary:     Effort: Pulmonary effort is normal.     Breath sounds: Normal breath sounds.  Chest:  Breasts:    Tanner Score is 5.     Right: Normal.     Left: Normal.     Comments: Healed surgical scar R breast Abdominal:     General: Abdomen is flat. Bowel sounds are normal.     Palpations: Abdomen is soft.  Genitourinary:    Comments: deferred Musculoskeletal:        General: Normal range of motion.     Cervical back: Normal range of motion and neck supple.  Skin:     General: Skin is warm and dry.  Neurological:     General: No focal deficit present.     Mental Status: She is alert and oriented to person, place, and time.  Psychiatric:        Mood and Affect: Mood normal.        Behavior: Behavior normal.         Assessment And Plan:     Encounter for general adult medical examination w/o abnormal findings Assessment & Plan: A full exam was performed.  Importance of monthly self  breast exams was discussed with the patient.  She is advised to get 30-45 minutes of regular exercise, no less than four to five days per week. Both weight-bearing and aerobic exercises are recommended.  She is advised to follow a healthy diet with at least six fruits/veggies per day, decrease intake of red meat and other saturated fats and to increase fish intake to twice weekly.  Meats/fish should not be fried -- baked, boiled or broiled is preferable. It is also important to cut back on your sugar intake.  Be sure to read labels - try to avoid anything with added sugar, high fructose corn syrup or other sweeteners.  If you must use a sweetener, you can try stevia or monkfruit.  It is also important to avoid artificially sweetened foods/beverages and diet drinks. Lastly, wear SPF 50 sunscreen on exposed skin and when in direct sunlight for an extended period of time.  Be sure to avoid fast food restaurants and aim for at least 60 ounces of water daily.       Essential hypertension, benign Assessment & Plan: Chronic, controlled.  EKG performed, NSR w/ occasional PACs.  She will continue with amlodipine 2.5mg  daily.  She is reminded to follow a low sodium diet.  She is now followed by HTN clinic, their input is appreciated.  She will continue with current meds and f/u in six months.    Orders: -     POCT urinalysis dipstick -     Microalbumin / creatinine urine ratio -     CBC -     CMP14+EGFR -     Lipid panel -     EKG 12-Lead  Primary hypothyroidism Assessment &  Plan: Chronic, currently on Synthroid daily. I will check thyroid function and adjust meds as needed. If there are no changes today, will f/u in six months.   Orders: -     TSH + free T4 -     Cortisol  Other abnormal glucose Assessment & Plan: Pt advised August 2024 reading was at 6.5 which is in diabetes range. Previous labs reviewed, her A1c has been elevated in the past. I will check an A1c today. Reminded to avoid refined sugars including sugary drinks/foods and processed meats including bacon, sausages and deli meats.    Orders: -     Hemoglobin A1c  Malignant neoplasm of upper-outer quadrant of right breast in female, estrogen receptor negative (HCC) Assessment & Plan: Initially diagnosed in 2021.  She is s/p right lumpectomy, neoadjuvant chemotherapy, XRT and letrozole. She was later switched to anastrozole due to side effects. Has upcoming appt with Dr. Pamelia Hoit Sept 2024.     Immunization due -     Pneumococcal conjugate vaccine 20-valent     Return for 1 year HM, 6 MONTH THYROID & BPC. Patient was given opportunity to ask questions. Patient verbalized understanding of the plan and was able to repeat key elements of the plan. All questions were answered to their satisfaction.    I, Gwynneth Aliment, MD, have reviewed all documentation for this visit. The documentation on 08/03/23 for the exam, diagnosis, procedures, and orders are all accurate and complete.

## 2023-08-03 NOTE — Patient Instructions (Signed)

## 2023-08-03 NOTE — Assessment & Plan Note (Signed)
Pt advised August 2024 reading was at 6.5 which is in diabetes range. Previous labs reviewed, her A1c has been elevated in the past. I will check an A1c today. Reminded to avoid refined sugars including sugary drinks/foods and processed meats including bacon, sausages and deli meats.

## 2023-08-04 LAB — CMP14+EGFR
ALT: 21 IU/L (ref 0–32)
AST: 42 IU/L — ABNORMAL HIGH (ref 0–40)
Albumin: 4.5 g/dL (ref 3.8–4.9)
Alkaline Phosphatase: 105 IU/L (ref 44–121)
BUN/Creatinine Ratio: 14 (ref 9–23)
BUN: 12 mg/dL (ref 6–24)
Bilirubin Total: 0.5 mg/dL (ref 0.0–1.2)
CO2: 23 mmol/L (ref 20–29)
Calcium: 9.6 mg/dL (ref 8.7–10.2)
Chloride: 103 mmol/L (ref 96–106)
Creatinine, Ser: 0.87 mg/dL (ref 0.57–1.00)
Globulin, Total: 3.1 g/dL (ref 1.5–4.5)
Glucose: 85 mg/dL (ref 70–99)
Potassium: 4.5 mmol/L (ref 3.5–5.2)
Sodium: 141 mmol/L (ref 134–144)
Total Protein: 7.6 g/dL (ref 6.0–8.5)
eGFR: 79 mL/min/{1.73_m2} (ref >59)

## 2023-08-04 LAB — CBC
Hematocrit: 42.6 % (ref 34.0–46.6)
Hemoglobin: 14.2 g/dL (ref 11.1–15.9)
MCH: 30.5 pg (ref 26.6–33.0)
MCHC: 33.3 g/dL (ref 31.5–35.7)
MCV: 92 fL (ref 79–97)
Platelets: 246 10*3/uL (ref 150–450)
RBC: 4.65 x10E6/uL (ref 3.77–5.28)
RDW: 12.8 % (ref 11.7–15.4)
WBC: 6.6 10*3/uL (ref 3.4–10.8)

## 2023-08-04 LAB — HEMOGLOBIN A1C
Est. average glucose Bld gHb Est-mCnc: 134 mg/dL
Hgb A1c MFr Bld: 6.3 % — ABNORMAL HIGH (ref 4.8–5.6)

## 2023-08-04 LAB — CORTISOL: Cortisol: 7.9 ug/dL (ref 6.2–19.4)

## 2023-08-04 LAB — LIPID PANEL
Chol/HDL Ratio: 3.3 ratio (ref 0.0–4.4)
Cholesterol, Total: 173 mg/dL (ref 100–199)
HDL: 52 mg/dL (ref >39)
LDL Chol Calc (NIH): 110 mg/dL — ABNORMAL HIGH (ref 0–99)
Triglycerides: 55 mg/dL (ref 0–149)
VLDL Cholesterol Cal: 11 mg/dL (ref 5–40)

## 2023-08-04 LAB — MICROALBUMIN / CREATININE URINE RATIO
Creatinine, Urine: 29.8 mg/dL
Microalb/Creat Ratio: <10 mg/g{creat} (ref 0–29)
Microalbumin, Urine: <3 ug/mL

## 2023-08-04 LAB — TSH+FREE T4
Free T4: 1.55 ng/dL (ref 0.82–1.77)
TSH: 2.43 u[IU]/mL (ref 0.450–4.500)

## 2023-08-09 ENCOUNTER — Telehealth: Payer: Self-pay | Admitting: Licensed Clinical Social Worker

## 2023-08-09 ENCOUNTER — Ambulatory Visit: Payer: Self-pay

## 2023-08-09 NOTE — Telephone Encounter (Signed)
 CSW contacted patient to inform that Amy [facility] Guide is out of the office unexpectedly today. Amy will contact patient to reschedule. Lasandra Beech, LCSW, CCSW-MCS [phone]

## 2023-08-10 ENCOUNTER — Telehealth: Payer: Self-pay

## 2023-08-10 DIAGNOSIS — Z0000 Encounter for general adult medical examination without abnormal findings: Secondary | ICD-10-CM

## 2023-08-10 NOTE — Telephone Encounter (Signed)
 Called patient to reschedule telephonic health coaching appointment on 08/09/23 due to being out of office. Patient has been rescheduled for 08/16/23 at 9:00am for her final appointment.   [name], MS, ERHD, NBC-HWC  Care Guide [facility] Care Navigation Telephone: [phone] Email: [email or web address]

## 2023-08-16 ENCOUNTER — Telehealth: Payer: Self-pay | Admitting: Licensed Clinical Social Worker

## 2023-08-16 ENCOUNTER — Telehealth: Payer: Self-pay

## 2023-08-16 ENCOUNTER — Ambulatory Visit: Attending: Cardiology

## 2023-08-16 ENCOUNTER — Ambulatory Visit

## 2023-08-16 DIAGNOSIS — Z0000 Encounter for general adult medical examination without abnormal findings: Secondary | ICD-10-CM

## 2023-08-16 NOTE — Telephone Encounter (Signed)
 CSW contacted patient to inform that [name], Care Guide is unable to make this mornings appointment and will contact her to reschedule. Lasandra Beech, LCSW, CCSW-MCS [phone]

## 2023-08-16 NOTE — Progress Notes (Signed)
 Appointment Outcome: Completed, Session #: Final Start time: 12:01pm   End time: 12:32pm   Total Mins: 31 minutes  AGREEMENTS SECTION   Overall Goal(s): Reduce blood pressure to <=130/80 over the next three months Reduce A1c over the next three months     Agreement/Action Steps:  Monitor sodium and sugar intake by reading food labels Aim for 1500 mg of sodium per day Aim for 25 g of sugar per day  Practice portion control per recommended serving size (or less) on food label Use 9" plate  non-starchy vegetables  lean protein  carbohydrate Increase intake of high fiber carbohydrates 30-45 grams per meal 15 grams per snack    Progress Notes:  Patient reported that she has continued implementing her health coaching agreement/action steps without any challenges over the past 3 months. Patient has also continued to include 150 minutes or more of exercising at the gym that combined cardio and weightlifting.   Patient recent labs showed that her A1c remained the same at 6.3, total cholesterol decreased by 10 points from 183 to 173, triglycerides were decreased to 55 from 78, but her LDL is 110 and HDL decreased to 52 from 60. Patient's recent bp at visit on 08/03/23 was 124/70.  Patient stated that she attributes the no change in her A1c and lipid panel to taking anastrozole. Patient plans on having a discussion with provider about her concerns regarding the effects of this medication on her A1c and cholesterol due to the changes she has made within her diet and incorporation of regular moderate-to-vigorous exercise.   Patient mentioned that after meeting with her PCP on 3/18 she had a discussion with her son who is in the health and fitness field to assist her with eating healthy and losing weight. Patient is now being supported by her son and has been provided a meal plan to follow that is high protein, low carb and low fat. Patient has been implementing this diet for the past week and  reports losing 3.4 pounds since starting on 08/09/2023. Patient is reporting her weight and body composition per her digital scale at home to her son for accountability.  Patient shared that she will be able to maintain following this meal plan and her other action steps when traveling for work. Patient will meal prep when going out of town according to the meal plan.   Patient stated that she found health coaching helpful because of the focus on the resources/materials and steps necessary to reach the goal, the interaction, and a way to track and report progress to be accountable for achieving her goals.   Coaching Outcomes: Patient will continue to implement her health coaching agreement/action steps as outlined above in addition to the high protein meal plan that her son provided her to follow for weight loss. Patient aim is to lose weight to assist in lowering her A1c.   Patient will follow up with her PCP about medication concerns/labs via MyChart.   Patient completed the health coaching program as of today, 08/16/2023.     Attempted: Fulfilled - Patient completed the 20-month maintenance agreement in full and was able to meet the challenge.  Referrals: N/A  Resources: N/A

## 2023-08-16 NOTE — Telephone Encounter (Signed)
 Called patient to reschedule appointment for today due to being out of the office. Patient has been rescheduled to 12pm today for her last telephonic health coaching appointment.   [name], MS, ERHD, NBC-HWC  Care Guide [facility] V[facility] Care Navigation Telephone: [phone] Email: [email or web address]

## 2023-09-27 ENCOUNTER — Ambulatory Visit: Attending: Hematology and Oncology

## 2023-09-27 VITALS — Wt 196.4 lb

## 2023-09-27 DIAGNOSIS — Z483 Aftercare following surgery for neoplasm: Secondary | ICD-10-CM | POA: Insufficient documentation

## 2023-09-27 NOTE — Therapy (Signed)
 OUTPATIENT PHYSICAL THERAPY SOZO SCREENING NOTE   Patient Name: Shannon Kane MRN: [id number] DOB:[birth year], 53 y.o., female Today's Date: 09/27/2023  PCP: Cleave Curling, MD REFERRING PROVIDER: Cameron Cea, MD   PT End of Session - 09/27/23 1650     Visit Number 9   # unchanged due to screen only   PT Start Time 1648    PT Stop Time 1657    PT Time Calculation (min) 9 min    Activity Tolerance Patient tolerated treatment well    Behavior During Therapy [facility] for tasks assessed/performed             Past Medical History:  Diagnosis Date   Abnormal glucose    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 Retta Caster   Hypertension    Hyperthyroidism    in the past   Hypothyroidism    Iron deficiency anemia    Palpitation    Personal history of chemotherapy    Personal history of radiation therapy    Spontaneous ecchymoses    Tachycardia    Vitamin D  deficiency    Past Surgical History:  Procedure Laterality Date   BREAST BIOPSY     BREAST EXCISIONAL BIOPSY     BREAST LUMPECTOMY Right 08/20/2020   w/ rad w/ chemo   BREAST LUMPECTOMY WITH RADIOACTIVE SEED LOCALIZATION Right 08/20/2020   Procedure: RADIOACTIVE SEED GUIDED RIGHT BREAST LUMPECTOMY;  Surgeon: Oza Blumenthal, MD;  [facility]: [facility];  Service: General;  Laterality: Right;   CHOLECYSTECTOMY     COLONOSCOPY  05/18/2018   KNEE SURGERY     PORT-A-CATH REMOVAL N/A 04/30/2021   Procedure: REMOVAL PORT-A-CATH;  Surgeon: Oza Blumenthal, MD;  Location: [facility];  Service: General;  Laterality: N/A;   PORTACATH PLACEMENT Left 04/15/2020   Procedure: INSERTION PORT-A-CATH;  Surgeon: Oza Blumenthal, MD;  Location: [facility];  Service: General;  Laterality: Left;   RADIOACTIVE SEED GUIDED AXILLARY SENTINEL LYMPH  NODE Right 08/20/2020   Procedure: RADIOACTIVE SEED TARGETED RIGHT AXILLARY LYMPH NODE DISSECTION;  Surgeon: Oza Blumenthal, MD;  Location: [facility];  Service: General;  Laterality: Right;   TONSILLECTOMY     Patient Active Problem List   Diagnosis Date Noted   Encounter for general adult medical examination w/o abnormal findings 08/03/2023   Other abnormal glucose 01/20/2023   Trigger middle finger of left hand 01/20/2023   Constipation 07/15/2021   Chronic idiopathic constipation 07/15/2021   Dysphagia 07/15/2021   Gastroesophageal reflux disease 07/15/2021   Uterine leiomyoma 04/19/2020   Genetic testing 04/19/2020   Family history of ovarian cancer 04/03/2020   Family history of leukemia 04/03/2020   Family history of breast cancer 04/03/2020   Malignant neoplasm of upper-outer quadrant of right breast in female, estrogen receptor negative (HCC) 03/28/2020   Overweight (BMI 25.0-29.9) 12/05/2019   Essential hypertension, benign 05/29/2018   Iritis of left eye 05/29/2018   Iron deficiency anemia due to chronic blood loss 05/29/2018   Primary hypothyroidism 05/29/2018    REFERRING DIAG: right breast cancer at risk for lymphedema  THERAPY DIAG:  Aftercare following surgery for neoplasm  PERTINENT HISTORY: Patient was diagnosed on 03/14/2020 with right grade 2 invasive ductal carcinoma breast cancer.  She underwent neoadjuvant chemo followed by a right lumpectomy and targeted node dissection (7 negative lymph nodes)  on 08/20/2020. It is ER/PR negative and HER2 positive with a Ki67 of 30%. She has a positive axillary lymph node. Still has port on the left side for infusion for Herception, Projeta. Port removed on 04/30/2021   PRECAUTIONS: right UE Lymphedema risk, None  SUBJECTIVE: Pt returns for her 6 month L-Dex screen. "I've been having a [place] tingling at the back of my upper arm and have been noticing some in creased swelling at the top of my chest/breast area."  PAIN:  Are  you having pain? No  SOZO SCREENING: Patient was assessed today using the SOZO machine to determine the lymphedema index score. This was compared to her baseline score. It was determined that she is within the recommended range when compared to her baseline and no further action is needed at this time. She will continue SOZO screenings. These are done every 3 months for 2 years post operatively followed by every 6 months for 2 years, and then annually.  Briefly assessed pts Rt breast per her request. She does have visible and palpable increased edema and peau d'orange at her Rt superior chest. This could be cause of increased tingling as her lymph nodes will be working harder to decrease the lymphatic fluid which could be putting pressure on brachial plexus. Pt reports she has recently been lifting more weights at the gym. Brief discussion about importance of wearing compression (bra and sleeve) when she exercises. Pt verbalized good understanding. Did suggest that though this is probably lymphedema, it is also higher on the chest than we normally see and she is 3 years post op so it would be a good idea to call her doctor to see if they want to rule out anything else, like a recurrence, before we resume physical therapy, which she is considering. Pt reports she will try compression x2 weeks and if no improvement, call her doctor then. Pt denies increased pain, redness, and no warmth noted to area of edema.    L-DEX FLOWSHEETS - 09/27/23 1600       L-DEX LYMPHEDEMA SCREENING   Measurement Type Unilateral    L-DEX MEASUREMENT EXTREMITY Upper Extremity    POSITION  Standing    DOMINANT SIDE Left    At Risk Side Right    BASELINE SCORE (UNILATERAL) 4.2    L-DEX SCORE (UNILATERAL) -0.4    VALUE CHANGE (UNILAT) -4.6                [name], PTA 09/27/2023, 5:18 PM

## 2023-10-12 ENCOUNTER — Telehealth: Payer: Self-pay

## 2023-10-12 NOTE — Telephone Encounter (Signed)
 Pt c/o increase superior breast edema and skin changes (peau d'orange noted) along with increased tingling at posterior upper arm when she came for SOZO screen x 2 weeks ago. Suggested her to try a few things to lessen symptoms and follow up call today to see if improvement noted or if she wanted to start physical therapy as was suggested at [facility]. She reports some improvement is noted with less breast edema noted and tingling is now intermittent and not as strong. Encouraged her to cont stretching throughout day as was discussed and she verbalized good understanding. Pt would like to hold off for now on PT but knows she can call this clinic at any time if things change. Pt was appreciative of phone call.

## 2023-10-14 ENCOUNTER — Other Ambulatory Visit

## 2023-10-14 ENCOUNTER — Other Ambulatory Visit: Payer: Self-pay | Admitting: Internal Medicine

## 2023-10-14 DIAGNOSIS — R748 Abnormal levels of other serum enzymes: Secondary | ICD-10-CM

## 2023-10-15 ENCOUNTER — Ambulatory Visit (HOSPITAL_BASED_OUTPATIENT_CLINIC_OR_DEPARTMENT_OTHER): Admitting: Cardiovascular Disease

## 2023-10-15 ENCOUNTER — Encounter (HOSPITAL_BASED_OUTPATIENT_CLINIC_OR_DEPARTMENT_OTHER): Payer: Self-pay | Admitting: Cardiovascular Disease

## 2023-10-15 ENCOUNTER — Ambulatory Visit: Attending: Cardiovascular Disease

## 2023-10-15 VITALS — BP 132/86 | HR 84 | Ht 68.0 in | Wt 195.8 lb

## 2023-10-15 DIAGNOSIS — I10 Essential (primary) hypertension: Secondary | ICD-10-CM

## 2023-10-15 DIAGNOSIS — R002 Palpitations: Secondary | ICD-10-CM

## 2023-10-15 HISTORY — DX: Palpitations: R00.2

## 2023-10-15 LAB — HEPATIC FUNCTION PANEL
ALT: 17 IU/L (ref 0–32)
AST: 22 IU/L (ref 0–40)
Albumin: 4.4 g/dL (ref 3.8–4.9)
Alkaline Phosphatase: 123 IU/L — ABNORMAL HIGH (ref 44–121)
Bilirubin Total: 0.3 mg/dL (ref 0.0–1.2)
Bilirubin, Direct: 0.12 mg/dL (ref 0.00–0.40)
Total Protein: 7.2 g/dL (ref 6.0–8.5)

## 2023-10-15 NOTE — Addendum Note (Signed)
 Addended by: Marci Setter B on: 10/15/2023 03:16 PM   Modules accepted: Orders

## 2023-10-15 NOTE — Progress Notes (Signed)
 Advanced Hypertension Clinic Initial Assessment:    Date:  10/15/2023   ID:  Shannon Kane, DOB [birth year], MRN [id number]  PCP:  Cleave Curling, MD  Cardiologist:  Maudine Sos, MD   Referring MD: Cleave Curling, MD   CC: Hypertension  History of Present Illness:    Shannon Kane is a 53 y.o. female with a hx of hypertension, breast cancer, diabetes, and hypothyroidism here for follow-up.  She had first established in the [facility] 09/2022.  She reported being diagnosed with hypertension in her 21s.  She attributed to work stress.  At the time of her initial visit her blood pressure was averaging in the 110s to 140s over 70s to 80s.  Labs were negative for hyperaldosteronism and pheochromocytoma.  Renal Dopplers and carotid Dopplers were both unremarkable 11/2022.  Prior echo 07/2021 revealed LVEF 60-65% with grade 1 diastolic dysfunction.  She was enrolled in our remote patient monitoring study and average blood pressure was 121/82 at the time of follow-up 11/2022.  She expressed a desire to stop her blood pressure medications.  They reduced valsartan  to 80 mg.  Recent blood pressures have ranged from 110s to 150/160s to 90s.  She is averaging in the 120s to 130s over 80s to 90s.  At her follow-up 03/2023 her blood pressure continued to be labile.  On average it was 120 over 70s.  We discussed the fact that her anastrozole  can increase both blood pressure and LDL.  She is very eager to be on as [place] medication as possible and to understand my she had elevated blood pressure.  Coronary calcium score 03/2023 was 0.  She elected not to be on cholesterol medication.  Discussed the use of AI scribe software for clinical note transcription with the patient, who gave verbal consent to proceed.  History of Present Illness Shannon Kane experienced knee pain after sliding in mud, causing her knee to bend awkwardly. Initially, there was no pain, and she completed a three-mile walk, but  later significant soreness and swelling developed. She used a TENS unit and pain relievers, which helped reduce the swelling and soreness. By the next morning, she was able to walk, and the swelling continued to decrease. Currently, there is no swelling in the knee.  She has a history of hypertension and has been monitoring her blood pressure at home. Her readings have been variable, with some as low as 114 and others higher, averaging in the 120s to 130s. Her blood pressure tends to be higher when she is in pain. She has experienced occasional irregular heartbeats, which were noted during a visit to her OB/GYN. Her blood pressure machine sometimes registers irregular heartbeats, but she has not experienced any alarming symptoms. Her calcium score was zero.  She continues her regular exercise routine, which includes weightlifting, although she took a break following her knee injury. She is considering retirement due to work-related stress, which she feels may be contributing to her blood pressure variability. She has experienced tingling in her arm, which was thought to be nerve-related.  Previous antihypertensives: valsartan    Past Medical History:  Diagnosis Date   Abnormal glucose    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 Retta Caster   Hypertension    Hyperthyroidism    in the past   Hypothyroidism  Iron deficiency anemia    Palpitation    Personal history of chemotherapy    Personal history of radiation therapy    Spontaneous ecchymoses    Tachycardia    Vitamin D  deficiency     Past Surgical History:  Procedure Laterality Date   BREAST BIOPSY     BREAST EXCISIONAL BIOPSY     BREAST LUMPECTOMY Right 08/20/2020   w/ rad w/ chemo   BREAST LUMPECTOMY WITH RADIOACTIVE SEED LOCALIZATION Right 08/20/2020    Procedure: RADIOACTIVE SEED GUIDED RIGHT BREAST LUMPECTOMY;  Surgeon: Oza Blumenthal, MD;  Location: [facility];  Service: General;  Laterality: Right;   CHOLECYSTECTOMY     COLONOSCOPY  05/18/2018   KNEE SURGERY     PORT-A-CATH REMOVAL N/A 04/30/2021   Procedure: REMOVAL PORT-A-CATH;  Surgeon: Oza Blumenthal, MD;  Location: [facility];  Service: General;  Laterality: N/A;   PORTACATH PLACEMENT Left 04/15/2020   Procedure: INSERTION PORT-A-CATH;  Surgeon: Oza Blumenthal, MD;  Location: [facility];  Service: General;  Laterality: Left;   RADIOACTIVE SEED GUIDED AXILLARY SENTINEL LYMPH NODE Right 08/20/2020   Procedure: RADIOACTIVE SEED TARGETED RIGHT AXILLARY LYMPH NODE DISSECTION;  Surgeon: Oza Blumenthal, MD;  Location: [facility];  Service: General;  Laterality: Right;   TONSILLECTOMY      Current Medications: Current Meds  Medication Sig   amLODipine  (NORVASC ) 2.5 MG tablet TAKE 1 TABLET DAILY   anastrozole  (ARIMIDEX ) 1 MG tablet TAKE 1 TABLET DAILY   Cholecalciferol (VITAMIN D -3 PO) Take 5,000 Units by mouth daily.   MAGNESIUM GLYCINATE PO Take by mouth.   SYNTHROID  75 MCG tablet TAKE 1 TABLET DAILY BEFORE BREAKFAST   vitamin B-12 (CYANOCOBALAMIN) 1000 MCG tablet Take 1,000 mcg by mouth daily.     Allergies:   Amoxicillin, Ciprofloxacin, and Penicillins   Social History   Socioeconomic History   Marital status: Married    Spouse name: Not on file   Number of children: Not on file   Years of education: Not on file   Highest education level: Not on file  Occupational History   Not on file  Tobacco Use   Smoking status: Never   Smokeless tobacco: Never  Vaping Use   Vaping status: Never Used  Substance and Sexual Activity   Alcohol use: Yes    Alcohol/week: 0.0 standard drinks of alcohol    Comment: social   Drug use: Never   Sexual activity: Not Currently    Birth control/protection: None  Other Topics Concern   Not on file  Social  History Narrative   Not on file   Social Drivers of Health   Financial Resource Strain: Low Risk  (10/15/2022)   Overall Financial Resource Strain (CARDIA)    Difficulty of Paying Living Expenses: Not hard at all  Food Insecurity: No Food Insecurity (03/11/2023)   Hunger Vital Sign    Worried About Running Out of Food in the Last Year: Never true    Ran Out of Food in the Last Year: Never true  Transportation Needs: No Transportation Needs (04/03/2020)   PRAPARE - Administrator, Civil Service (Medical): No    Lack of Transportation (Non-Medical): No  Physical Activity: Sufficiently Active (10/15/2022)   Exercise Vital Sign    Days of Exercise per Week: 6 days    Minutes of Exercise per Session: 30 min  Stress: No Stress Concern Present (10/15/2022)   [employer] of Occupational Health - Occupational Stress Questionnaire  Feeling of Stress : Only a [place]  Social Connections: Socially Integrated (10/15/2022)   Social Connection and Isolation Panel [NHANES]    Frequency of Communication with Friends and Family: More than three times a week    Frequency of Social Gatherings with Friends and Family: Three times a week    Attends Religious Services: More than 4 times per year    Active Member of Clubs or Organizations: Yes    Attends Engineer, structural: More than 4 times per year    Marital Status: Married     Family History: The patient's family history includes Bone cancer in her paternal grandfather; Breast cancer in an other family member; Cancer in her maternal uncle and maternal uncle; Diabetes in her mother; Hypertension in her mother; Leukemia in her father; Ovarian cancer (age of onset: 75) in her maternal grandmother.  ROS:   Please see the history of present illness.     All other systems reviewed and are negative.  EKGs/Labs/Other Studies Reviewed:     Recent Labs: 08/03/2023: BUN 12; Creatinine, Ser 0.87; Hemoglobin 14.2; Platelets 246;  Potassium 4.5; Sodium 141; TSH 2.430 10/14/2023: ALT 17   Recent Lipid Panel    Component Value Date/Time   CHOL 173 08/03/2023 0931   TRIG 55 08/03/2023 0931   HDL 52 08/03/2023 0931   CHOLHDL 3.3 08/03/2023 0931   CHOLHDL 4.3 04/20/2022 0805   VLDL 13 04/20/2022 0805   LDLCALC 110 (H) 08/03/2023 0931    Physical Exam:   VS:  BP (!) 148/92   Pulse 84   Ht 5\' 8"  (1.727 m)   Wt 195 lb 12.8 oz (88.8 kg)   LMP 09/16/2019   SpO2 99%   BMI 29.77 kg/m  , BMI Body mass index is 29.77 kg/m. GENERAL:  Well appearing HEENT: Pupils equal round and reactive, fundi not visualized, oral mucosa unremarkable NECK:  No jugular venous distention, waveform within normal limits, carotid upstroke brisk and symmetric, no bruits, no thyromegaly LUNGS:  Clear to auscultation bilaterally HEART:  RRR.  PMI not displaced or sustained,S1 and S2 within normal limits, no S3, no S4, no clicks, no rubs, no murmurs ABD:  Flat, positive bowel sounds normal in frequency in pitch, no bruits, no rebound, no guarding, no midline pulsatile mass, no hepatomegaly, no splenomegaly EXT:  2 plus pulses throughout, no edema, no cyanosis no clubbing SKIN:  No rashes no nodules NEURO:  Cranial nerves II through XII grossly intact, motor grossly intact throughout PSYCH:  Cognitively intact, oriented to person place and time   ASSESSMENT/PLAN:    Assessment & Plan # Premature atrial contraction (PAC) EKG showed benign PACs. Irregular heartbeats not concerning for atrial fibrillation or dangerous arrhythmias. PACs may cause irregular readings on blood pressure monitors but are not dangerous. - Will send 7 day Zio.  No arrhythmias detected in clinic.  Only one PAC noted on prior EKGs.  - Continue regular cardiovascular exercise.  # Hypertension Blood pressure varies.  It is reasonably well-managed with amlodipine . Discontinuation may lead to higher pressures. Emphasized maintaining below 130/80 mmHg. She is considering  stopping amlodipine , expecting higher readings  She is very eager to avoid medication if possible.   - Continue current antihypertensive regimen. - Monitor blood pressure regularly. - Consider stopping amlodipine  for a couple of weeks as an experiment, expect higher readings. - Follow up in one year unless issues arise.  # Knee pain and swelling Recent knee injury with improved pain and swelling. No  concern for serious injury or ACL tear at this time. - Continue using TENS unit for pain relief. - Rest and avoid activities that exacerbate knee pain. - Monitor for worsening symptoms or lack of improvement.   Screening for Secondary Hypertension:      10/18/2022    1:41 PM  Causes  Drugs/Herbals Screened     - Comments No OTC, no excessive caffeine  Renovascular HTN Screened  Thyroid  Disease Screened     - Comments hypothyroidism per PCP  Hyperaldosteronism Screened  Pheochromocytoma Screened  Coarctation of the Aorta Screened  Compliance Screened    Relevant Labs/Studies:    Latest Ref Rng & Units 08/03/2023    9:31 AM 01/20/2023    9:09 AM 07/20/2022   10:11 AM  Basic Labs  Sodium 134 - 144 mmol/L 141  141  142   Potassium 3.5 - 5.2 mmol/L 4.5  4.1  4.2   Creatinine 0.57 - 1.00 mg/dL 1.61  0.96  0.45        Latest Ref Rng & Units 08/03/2023    9:31 AM 01/20/2023    9:09 AM  Thyroid    TSH 0.450 - 4.500 uIU/mL 2.430  1.140        Latest Ref Rng & Units 10/26/2022    8:48 AM  Renin/Aldosterone   Aldosterone 0.0 - 30.0 ng/dL 6.7   Aldos/Renin Ratio 0.0 - 30.0 32.2        Latest Ref Rng & Units 10/26/2022    8:48 AM  Metanephrines/Catecholamines   Epinephrine  0 - 62 pg/mL <15   Norepinephrine 0 - 874 pg/mL 528   Dopamine 0 - 48 pg/mL <30   Metanephrines 0.0 - 88.0 pg/mL Comment:   Normetanephrines  0.0 - 244.0 pg/mL Comment:           11/23/2022    9:43 AM  Renovascular   Renal Artery US  Completed Yes     Disposition:    FU with MD/PharmD in 6 months     Medication Adjustments/Labs and Tests Ordered: Current medicines are reviewed at length with the patient today.  Concerns regarding medicines are outlined above.  No orders of the defined types were placed in this encounter.  No orders of the defined types were placed in this encounter.   Signed, Maudine Sos, MD  10/15/2023 11:40 AM    [facility]

## 2023-10-15 NOTE — Patient Instructions (Addendum)
 Medication Instructions:  Your physician recommends that you continue on your current medications as directed. Please refer to the Current Medication list given to you today.   Labwork: NONE  Testing/Procedures: 7 DAY ZIO    Follow-Up: 1 YEAR WITH DR [place] OR CAITLIN W NP   If you need a refill on your cardiac medications before your next appointment, please call your pharmacy.

## 2023-10-15 NOTE — Progress Notes (Unsigned)
 Enrolled patient for a 7 day Zio XT monitor to be mailed to patients home.

## 2023-10-16 ENCOUNTER — Ambulatory Visit: Payer: Self-pay | Admitting: Internal Medicine

## 2023-10-27 NOTE — Addendum Note (Signed)
 Addended by: Maudine Sos C on: 10/27/2023 04:23 PM   Modules accepted: Orders

## 2023-11-17 DIAGNOSIS — R002 Palpitations: Secondary | ICD-10-CM | POA: Diagnosis not present

## 2023-11-29 ENCOUNTER — Ambulatory Visit: Payer: Self-pay | Admitting: Cardiovascular Disease

## 2023-12-15 ENCOUNTER — Other Ambulatory Visit: Payer: Self-pay | Admitting: Internal Medicine

## 2024-01-28 ENCOUNTER — Other Ambulatory Visit: Payer: Self-pay

## 2024-01-28 DIAGNOSIS — C50411 Malignant neoplasm of upper-outer quadrant of right female breast: Secondary | ICD-10-CM

## 2024-01-31 ENCOUNTER — Inpatient Hospital Stay: Attending: Hematology and Oncology

## 2024-01-31 DIAGNOSIS — C50411 Malignant neoplasm of upper-outer quadrant of right female breast: Secondary | ICD-10-CM | POA: Diagnosis present

## 2024-01-31 DIAGNOSIS — Z9221 Personal history of antineoplastic chemotherapy: Secondary | ICD-10-CM | POA: Diagnosis not present

## 2024-01-31 DIAGNOSIS — Z170 Estrogen receptor positive status [ER+]: Secondary | ICD-10-CM | POA: Insufficient documentation

## 2024-01-31 DIAGNOSIS — M25649 Stiffness of unspecified hand, not elsewhere classified: Secondary | ICD-10-CM | POA: Insufficient documentation

## 2024-01-31 DIAGNOSIS — Z79811 Long term (current) use of aromatase inhibitors: Secondary | ICD-10-CM | POA: Insufficient documentation

## 2024-01-31 DIAGNOSIS — Z923 Personal history of irradiation: Secondary | ICD-10-CM | POA: Diagnosis not present

## 2024-01-31 DIAGNOSIS — Z1722 Progesterone receptor negative status: Secondary | ICD-10-CM | POA: Diagnosis not present

## 2024-01-31 LAB — CBC WITH DIFFERENTIAL (CANCER CENTER ONLY)
Abs Immature Granulocytes: 0 K/uL (ref 0.00–0.07)
Basophils Absolute: 0 K/uL (ref 0.0–0.1)
Basophils Relative: 1 %
Eosinophils Absolute: 0.1 K/uL (ref 0.0–0.5)
Eosinophils Relative: 2 %
HCT: 41.8 % (ref 36.0–46.0)
Hemoglobin: 14.4 g/dL (ref 12.0–15.0)
Immature Granulocytes: 0 %
Lymphocytes Relative: 22 %
Lymphs Abs: 0.9 K/uL (ref 0.7–4.0)
MCH: 31.1 pg (ref 26.0–34.0)
MCHC: 34.4 g/dL (ref 30.0–36.0)
MCV: 90.3 fL (ref 80.0–100.0)
Monocytes Absolute: 0.4 K/uL (ref 0.1–1.0)
Monocytes Relative: 9 %
Neutro Abs: 2.8 K/uL (ref 1.7–7.7)
Neutrophils Relative %: 66 %
Platelet Count: 222 K/uL (ref 150–400)
RBC: 4.63 MIL/uL (ref 3.87–5.11)
RDW: 14 % (ref 11.5–15.5)
WBC Count: 4.3 K/uL (ref 4.0–10.5)
nRBC: 0 % (ref 0.0–0.2)

## 2024-01-31 LAB — CMP (CANCER CENTER ONLY)
ALT: 15 U/L (ref 0–44)
AST: 21 U/L (ref 15–41)
Albumin: 4.3 g/dL (ref 3.5–5.0)
Alkaline Phosphatase: 96 U/L (ref 38–126)
Anion gap: 4 — ABNORMAL LOW (ref 5–15)
BUN: 12 mg/dL (ref 6–20)
CO2: 29 mmol/L (ref 22–32)
Calcium: 9.5 mg/dL (ref 8.9–10.3)
Chloride: 108 mmol/L (ref 98–111)
Creatinine: 0.87 mg/dL (ref 0.44–1.00)
GFR, Estimated: >60 mL/min (ref >60)
Glucose, Bld: 98 mg/dL (ref 70–99)
Potassium: 4.5 mmol/L (ref 3.5–5.1)
Sodium: 141 mmol/L (ref 135–145)
Total Bilirubin: 0.5 mg/dL (ref 0.0–1.2)
Total Protein: 7.6 g/dL (ref 6.5–8.1)

## 2024-02-07 ENCOUNTER — Ambulatory Visit: Admitting: Internal Medicine

## 2024-02-07 VITALS — BP 128/84 | HR 80 | Temp 97.7°F | Ht 68.0 in | Wt 197.4 lb

## 2024-02-07 DIAGNOSIS — I10 Essential (primary) hypertension: Secondary | ICD-10-CM | POA: Diagnosis not present

## 2024-02-07 DIAGNOSIS — G8929 Other chronic pain: Secondary | ICD-10-CM | POA: Diagnosis not present

## 2024-02-07 DIAGNOSIS — E6609 Other obesity due to excess calories: Secondary | ICD-10-CM

## 2024-02-07 DIAGNOSIS — Z1379 Encounter for other screening for genetic and chromosomal anomalies: Secondary | ICD-10-CM

## 2024-02-07 DIAGNOSIS — Z7184 Encounter for health counseling related to travel: Secondary | ICD-10-CM

## 2024-02-07 DIAGNOSIS — E039 Hypothyroidism, unspecified: Secondary | ICD-10-CM

## 2024-02-07 DIAGNOSIS — M25561 Pain in right knee: Secondary | ICD-10-CM

## 2024-02-07 DIAGNOSIS — Z6830 Body mass index (BMI) 30.0-30.9, adult: Secondary | ICD-10-CM

## 2024-02-07 DIAGNOSIS — R7309 Other abnormal glucose: Secondary | ICD-10-CM

## 2024-02-07 DIAGNOSIS — E66811 Obesity, class 1: Secondary | ICD-10-CM | POA: Insufficient documentation

## 2024-02-07 DIAGNOSIS — H60332 Swimmer's ear, left ear: Secondary | ICD-10-CM

## 2024-02-07 MED ORDER — MEFLOQUINE HCL 250 MG PO TABS: 250.0000 mg | ORAL_TABLET | ORAL | 0 refills | Status: DC

## 2024-02-07 NOTE — Assessment & Plan Note (Signed)
 Chronic, controlled.  She will continue with amlodipine  2.5mg  daily.  She is reminded to follow a low sodium diet.  She is now followed by HTN clinic, their input is appreciated.  She will continue with current meds and f/u in six months.

## 2024-02-07 NOTE — Assessment & Plan Note (Signed)
 She was congratulated on her active lifestyle.  Encouraged to keep up the great work.

## 2024-02-07 NOTE — Assessment & Plan Note (Signed)
 Previous labs reviewed, her A1c has been elevated in the past. I will check an A1c today. Reminded to avoid refined sugars including sugary drinks/foods and processed meats including bacon, sausages and deli meats.

## 2024-02-07 NOTE — Patient Instructions (Signed)
 Hypertension, Adult Hypertension is another name for high blood pressure. High blood pressure forces your heart to work harder to pump blood. This can cause problems over time. There are two numbers in a blood pressure reading. There is a top number (systolic) over a bottom number (diastolic). It is best to have a blood pressure that is below 120/80. What are the causes? The cause of this condition is not known. Some other conditions can lead to high blood pressure. What increases the risk? Some lifestyle factors can make you more likely to develop high blood pressure: Smoking. Not getting enough exercise or physical activity. Being overweight. Having too much fat, sugar, calories, or salt (sodium) in your diet. Drinking too much alcohol. Other risk factors include: Having any of these conditions: Heart disease. Diabetes. High cholesterol. Kidney disease. Obstructive sleep apnea. Having a family history of high blood pressure and high cholesterol. Age. The risk increases with age. Stress. What are the signs or symptoms? High blood pressure may not cause symptoms. Very high blood pressure (hypertensive crisis) may cause: Headache. Fast or uneven heartbeats (palpitations). Shortness of breath. Nosebleed. Vomiting or feeling like you may vomit (nauseous). Changes in how you see. Very bad chest pain. Feeling dizzy. Seizures. How is this treated? This condition is treated by making healthy lifestyle changes, such as: Eating healthy foods. Exercising more. Drinking less alcohol. Your doctor may prescribe medicine if lifestyle changes do not help enough and if: Your top number is above 130. Your bottom number is above 80. Your personal target blood pressure may vary. Follow these instructions at home: Eating and drinking  If told, follow the DASH eating plan. To follow this plan: Fill one half of your plate at each meal with fruits and vegetables. Fill one fourth of your plate  at each meal with whole grains. Whole grains include whole-wheat pasta, brown rice, and whole-grain bread. Eat or drink low-fat dairy products, such as skim milk or low-fat yogurt. Fill one fourth of your plate at each meal with low-fat (lean) proteins. Low-fat proteins include fish, chicken without skin, eggs, beans, and tofu. Avoid fatty meat, cured and processed meat, or chicken with skin. Avoid pre-made or processed food. Limit the amount of salt in your diet to less than 1,500 mg each day. Do not drink alcohol if: Your doctor tells you not to drink. You are pregnant, may be pregnant, or are planning to become pregnant. If you drink alcohol: Limit how much you have to: 0-1 drink a day for women. 0-2 drinks a day for men. Know how much alcohol is in your drink. In the [place] one drink equals one 12 oz bottle of beer (355 mL), one 5 oz glass of wine (148 mL), or one 1 oz glass of hard liquor (44 mL). Lifestyle  Work with your doctor to stay at a healthy weight or to lose weight. Ask your doctor what the best weight is for you. Get at least 30 minutes of exercise that causes your heart to beat faster (aerobic exercise) most days of the week. This may include walking, swimming, or biking. Get at least 30 minutes of exercise that strengthens your muscles (resistance exercise) at least 3 days a week. This may include lifting weights or doing Pilates. Do not smoke or use any products that contain nicotine or tobacco. If you need help quitting, ask your doctor. Check your blood pressure at home as told by your doctor. Keep all follow-up visits. Medicines Take over-the-counter and prescription medicines  only as told by your doctor. Follow directions carefully. Do not skip doses of blood pressure medicine. The medicine does not work as well if you skip doses. Skipping doses also puts you at risk for problems. Ask your doctor about side effects or reactions to medicines that you should watch  for. Contact a doctor if: You think you are having a reaction to the medicine you are taking. You have headaches that keep coming back. You feel dizzy. You have swelling in your ankles. You have trouble with your vision. Get help right away if: You get a very bad headache. You start to feel mixed up (confused). You feel weak or numb. You feel faint. You have very bad pain in your: Chest. Belly (abdomen). You vomit more than once. You have trouble breathing. These symptoms may be an emergency. Get help right away. Call 911. Do not wait to see if the symptoms will go away. Do not drive yourself to the hospital. Summary Hypertension is another name for high blood pressure. High blood pressure forces your heart to work harder to pump blood. For most people, a normal blood pressure is less than 120/80. Making healthy choices can help lower blood pressure. If your blood pressure does not get lower with healthy choices, you may need to take medicine. This information is not intended to replace advice given to you by your health care provider. Make sure you discuss any questions you have with your health care provider. Document Revised: 02/20/2021 Document Reviewed: 02/20/2021 Elsevier Patient Education  2024 [employer].

## 2024-02-07 NOTE — Assessment & Plan Note (Signed)
 Currently on Synthroid  75 mcg. Discussed potential influence of biotin in multivitamins on thyroid  test results. No recent thyroid  function tests done. - Stop multivitamin containing biotin one week before thyroid  lab work. - Continue Synthroid  75 mcg as prescribed. - Schedule thyroid  function tests for next Monday.

## 2024-02-07 NOTE — Progress Notes (Signed)
 I,Victoria T Emmitt, CMA,acting as a Neurosurgeon for Catheryn LOISE Slocumb, MD.,have documented all relevant documentation on the behalf of Catheryn LOISE Slocumb, MD,as directed by  Catheryn LOISE Slocumb, MD while in the presence of Catheryn LOISE Slocumb, MD.  Subjective:  Patient ID: Shannon Kane , female    DOB: 10/15/1970 , 53 y.o.   MRN: [id number]  Chief Complaint  Patient presents with  . Hypertension    The patient is here today for a follow-up for her blood pressure & thyroid .  She reports compliance with meds. She denies having headaches, chest pain and shortness of breath. She reports no specific questions or concerns.  She would like a rx for Malaria pills. She is to leave on 10/4 & come back on 10/15. She is traveling to [place], [place].    . Hypothyroidism    HPI Discussed the use of AI scribe software for clinical note transcription with the patient, who gave verbal consent to proceed.  History of Present Illness Shannon Kane is a 53 year old female with a history of knee surgeries who presents with right knee pain.  She has been experiencing persistent right knee pain since June following a twisting injury during a mud run. The pain is described as 'thriving' and is primarily located in the knee area, with occasional sensations of popping and catching. She has a history of multiple surgeries on this knee and suspects the presence of loose bodies. No significant swelling has been noted; the patient reports that any swelling is not more than what is usual for her knee given her history of surgeries.  She has been offered cortisone shots, visco supplementation, and anti-inflammatory medications in the past but has not yet decided on a treatment plan. She has been using a knee brace and has tried Tiger Balm for relief. The pain is exacerbated by activities such as pickleball, which she plays regularly.  She is currently taking amlodipine  2.5 mg, anastrozole  (Remedex) on Monday, Wednesday, and  Friday, and Synthroid  75 mcg. She also takes a liquid multivitamin containing biotin, which she plans to stop before her upcoming lab work.  She is preparing for a trip to [place] and is concerned about managing her blood pressure and knee pain during the trip. No significant swelling in the knee, reports occasional irregular heartbeat, and dry skin in the ear canals.   The patient is here today for a follow-up for her blood pressure & thyroid .  She reports compliance with meds. She denies having headaches, chest pain and shortness of breath. She reports no specific questions or concerns.   Per HTN clinic she is to monitor her bp by checking it twice daily.      Hypertension This is a chronic problem. The current episode started more than 1 year ago. The problem has been gradually improving since onset. The problem is controlled. Pertinent negatives include no blurred vision, chest pain, orthopnea, palpitations or shortness of breath. Past treatments include ACE inhibitors. The current treatment provides moderate improvement. Identifiable causes of hypertension include a thyroid  problem.  Thyroid  Problem Presents for follow-up visit. Patient reports no cold intolerance, leg swelling, palpitations or weight gain. The symptoms have been stable.     Past Medical History:  Diagnosis Date  . Abnormal glucose   . 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 Family history of breast cancer 04/03/2020  . Family history of ovarian cancer 04/03/2020  . History of radiation therapy  Right breast- 09/25/20-11/12/20- Dr. Lynwood Nasuti  . Hypertension   . Hyperthyroidism    in the past  . Hypothyroidism   . Iron deficiency anemia   . Palpitation   . Palpitations 10/15/2023  . Personal history of chemotherapy   . Personal history of radiation therapy   . Spontaneous ecchymoses   . Tachycardia   . Vitamin D  deficiency      Family History  Problem Relation Age of Onset  . Diabetes  Mother   . Hypertension Mother   . Leukemia Father        dx > 50  . Ovarian cancer Maternal Grandmother 39  . Bone cancer Paternal Grandfather        dx unknown age  . Cancer Maternal Uncle        Mouth and throad; dx > 50  . Breast cancer Other        MGM's niece, dx unknown age  . Cancer Maternal Uncle        unknown type; dx > 50     Current Outpatient Medications:  .  amLODipine  (NORVASC ) 2.5 MG tablet, TAKE 1 TABLET DAILY, Disp: 90 tablet, Rfl: 3 .  anastrozole  (ARIMIDEX ) 1 MG tablet, TAKE 1 TABLET DAILY, Disp: 90 tablet, Rfl: 3 .  Cholecalciferol (VITAMIN D -3 PO), Take 5,000 Units by mouth daily., Disp: , Rfl:  .  MAGNESIUM GLYCINATE PO, Take by mouth., Disp: , Rfl:  .  SYNTHROID  75 MCG tablet, TAKE 1 TABLET DAILY BEFORE BREAKFAST, Disp: 90 tablet, Rfl: 3 .  vitamin B-12 (CYANOCOBALAMIN) 1000 MCG tablet, Take 1,000 mcg by mouth daily., Disp: , Rfl:    Allergies  Allergen Reactions  . Amoxicillin     Tolerates ANCEF   . Ciprofloxacin Itching  . Penicillins Hives    Tolerates ANCEF       Review of Systems  Constitutional: Negative.  Negative for weight gain.  Eyes:  Negative for blurred vision.  Respiratory: Negative.  Negative for shortness of breath.   Cardiovascular: Negative.  Negative for chest pain, palpitations and orthopnea.  Gastrointestinal: Negative.   Endocrine: Negative for cold intolerance.  Musculoskeletal:  Positive for arthralgias.  Neurological: Negative.   Psychiatric/Behavioral: Negative.       Today's Vitals   02/07/24 0826  BP: 128/84  Pulse: 80  Temp: 97.7 F (36.5 C)  SpO2: 98%  Weight: 197 lb 6.4 oz (89.5 kg)  Height: 5' 8 (1.727 m)   Body mass index is 30.01 kg/m.  Wt Readings from Last 3 Encounters:  02/07/24 197 lb 6.4 oz (89.5 kg)  10/15/23 195 lb 12.8 oz (88.8 kg)  09/27/23 196 lb 6 oz (89.1 kg)     Objective:  Physical Exam Vitals and nursing note reviewed.  Constitutional:      Appearance: Normal appearance.   HENT:     Head: Normocephalic and atraumatic.  Eyes:     Extraocular Movements: Extraocular movements intact.  Cardiovascular:     Rate and Rhythm: Normal rate and regular rhythm.     Heart sounds: Normal heart sounds.  Pulmonary:     Effort: Pulmonary effort is normal.     Breath sounds: Normal breath sounds.  Musculoskeletal:     Cervical back: Normal range of motion.  Skin:    General: Skin is warm.  Neurological:     General: No focal deficit present.     Mental Status: She is alert.  Psychiatric:        Mood and Affect: Mood normal.  Behavior: Behavior normal.         Assessment And Plan:  Essential hypertension, benign  Primary hypothyroidism  Other abnormal glucose  Class 1 obesity due to excess calories with serious comorbidity and body mass index (BMI) of 30.0 to 30.9 in adult  Assessment and Plan Assessment & Plan Essential hypertension with diastolic dysfunction Blood pressure inconsistent with diastolic elevation. Diastolic dysfunction indicates impaired cardiac relaxation due to long-term hypertension. Current readings suboptimal despite healthy lifestyle. Emphasized importance of blood pressure control during travel to prevent complications. - Consider amlodipine  2.5 mg every other day or every three days while traveling. - Monitor blood pressure regularly, especially during travel. - Note dietary sodium intake when blood pressure is elevated.  Right knee pain and dysfunction, status post multiple surgeries Chronic right knee pain exacerbated by fall. Symptoms include pain, catching sensation, and inability to fully straighten knee. Previous orthopedic consultation unsatisfactory. Suspected loose bodies or more than arthritis. X-rays done but concerns not fully addressed. - Refer to a different orthopedic specialist, possibly Dr. Vernetta or first available. - Obtain a copy of recent knee x-rays on CD for the orthopedic visit. - Consider using  Voltaren gel for anti-inflammatory effect.  Hypothyroidism Currently on Synthroid  75 mcg. Discussed potential influence of biotin in multivitamins on thyroid  test results. No recent thyroid  function tests done. - Stop multivitamin containing biotin one week before thyroid  lab work. - Continue Synthroid  75 mcg as prescribed. - Schedule thyroid  function tests for next Monday.  Dry skin and erythema of left ear canal Left ear canal red and dry, possibly due to swimming. No significant itching, but dry skin present in both ears. - Apply over-the-counter hydrocortisone cream to the outer ear canal. - Prescribe ear drops for the left ear to prevent flare-up of erythema.  Malaria prophylaxis for travel to endemic area Travel planned to malaria-endemic areas. Discussed malaria prophylaxis regimen and potential issues with obtaining medication due to backorder or insurance issues. - Prescribe mefloquine  for malaria prophylaxis, to be taken once a week starting this Friday, continuing during the trip and for four weeks after return. - Send prescription to local pharmacy and check for availability. - Recommend wearing a mask on the plane to prevent illness during travel.     Return for 4 month bpc.  Patient was given opportunity to ask questions. Patient verbalized understanding of the plan and was able to repeat key elements of the plan. All questions were answered to their satisfaction.   I, Catheryn LOISE Slocumb, MD, have reviewed all documentation for this visit. The documentation on 02/07/24 for the exam, diagnosis, procedures, and orders are all accurate and complete.   IF YOU HAVE BEEN REFERRED TO A SPECIALIST, IT MAY TAKE 1-2 WEEKS TO SCHEDULE/PROCESS THE REFERRAL. IF YOU HAVE NOT HEARD FROM US /SPECIALIST IN TWO WEEKS, PLEASE GIVE US  A CALL AT [phone].   THE PATIENT IS ENCOURAGED TO PRACTICE SOCIAL DISTANCING DUE TO THE COVID-19 PANDEMIC.

## 2024-02-08 ENCOUNTER — Encounter: Payer: Self-pay | Admitting: Internal Medicine

## 2024-02-08 DIAGNOSIS — H60332 Swimmer's ear, left ear: Secondary | ICD-10-CM | POA: Insufficient documentation

## 2024-02-08 DIAGNOSIS — Z7184 Encounter for health counseling related to travel: Secondary | ICD-10-CM | POA: Insufficient documentation

## 2024-02-08 MED ORDER — NEOMYCIN-POLYMYXIN-HC 3.5-10000-1 OT SOLN: 4.0000 [drp] | Freq: Three times a day (TID) | OTIC | 0 refills | Status: DC

## 2024-02-08 NOTE — Assessment & Plan Note (Signed)
 Chronic right knee pain exacerbated by fall. Symptoms include pain, catching sensation, and inability to fully straighten knee. Previous orthopedic consultation unsatisfactory. Suspected loose bodies or more than arthritis. X-rays done but concerns not fully addressed. - Refer to a different orthopedic specialist, possibly Dr. Vernetta or first available. - Obtain a copy of recent knee x-rays on CD for the orthopedic visit. - Consider using Voltaren gel for anti-inflammatory effect.

## 2024-02-08 NOTE — Assessment & Plan Note (Signed)
 Travel planned to malaria-endemic areas. Discussed malaria prophylaxis regimen and potential issues with obtaining medication due to backorder or insurance issues. - Prescribe mefloquine  for malaria prophylaxis, to be taken once a week starting this Friday, continuing during the trip and for four weeks after return. - Send prescription to local pharmacy and check for availability. - Recommend wearing a mask on the plane to prevent illness during travel.

## 2024-02-08 NOTE — Assessment & Plan Note (Signed)
 Left ear canal red and dry, possibly due to swimming. No significant itching, but dry skin present in both ears. - Apply over-the-counter hydrocortisone cream to the outer ear canal. - Prescribe ear drops for the left ear to address swimmer's ear - Planned to send Cipro otic; however, she is allergic. Ofloxacin may cause cross reactivity, will send Cortisporin

## 2024-02-09 ENCOUNTER — Inpatient Hospital Stay (HOSPITAL_BASED_OUTPATIENT_CLINIC_OR_DEPARTMENT_OTHER): Admitting: Hematology and Oncology

## 2024-02-09 ENCOUNTER — Telehealth: Payer: Self-pay

## 2024-02-09 VITALS — BP 145/85 | HR 92 | Temp 98.6°F | Resp 18 | Ht 68.0 in | Wt 195.6 lb

## 2024-02-09 DIAGNOSIS — C50411 Malignant neoplasm of upper-outer quadrant of right female breast: Secondary | ICD-10-CM

## 2024-02-09 DIAGNOSIS — Z171 Estrogen receptor negative status [ER-]: Secondary | ICD-10-CM | POA: Diagnosis not present

## 2024-02-09 MED ORDER — ANASTROZOLE 1 MG PO TABS: 1.0000 mg | ORAL_TABLET | ORAL | Status: AC

## 2024-02-09 NOTE — Progress Notes (Signed)
 Patient Care Team: Jarold Medici, MD as PCP - General (Internal Medicine) Raford Riggs, MD as PCP - Cardiology (Cardiology) Vernetta Berg, MD as Consulting Physician (General Surgery) Odean Potts, MD as Consulting Physician (Hematology and Oncology) Shannon Agent, MD as Consulting Physician (Radiation Oncology) Jarvis Laroche, MD as Referring Physician (Obstetrics and Gynecology)  DIAGNOSIS:  Encounter Diagnosis  Name Primary?   Malignant neoplasm of upper-outer quadrant of right breast in female, estrogen receptor negative (HCC) Yes    SUMMARY OF ONCOLOGIC HISTORY: Oncology History  Malignant neoplasm of upper-outer quadrant of right breast in female, estrogen receptor negative (HCC)  03/28/2020 Initial Diagnosis   Patient palpated a right breast mass x2 wks. Mammogram and US  showed a 2.0cm mass at the 10 o'clock position in the right breast and two enlarged right axillary lymph nodes, 2.0cm and 1.7cm. 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   04/16/2020 - 07/30/2020 Chemotherapy   TCHP x6 cycles, pathologic complete response, Herceptin  Perjeta  maintenance    04/18/2020 Genetic Testing   Negative genetic testing: no pathogenic variants detected in [facility] Multi-Cancer Panel.  Variants of uncertain significance detected in CDH1 at c.1289T>G (p.Val430Gly) and EGFR at c.2873G>A (p.Arg958His).  The report date is April 18, 2020.   The Multi-Cancer Panel offered by [facility] includes sequencing and/or deletion duplication testing of the following 85 genes: AIP, ALK, APC, ATM, AXIN2,BAP1,  BARD1, BLM, BMPR1A, BRCA1, BRCA2, BRIP1, CASR, CDC73, CDH1, CDK4, CDKN1B, CDKN1C, CDKN2A (p14ARF), CDKN2A (p16INK4a), CEBPA, CHEK2, CTNNA1, DICER1, DIS3L2, EGFR, EPCAM (Deletion/duplication testing only), FH, FLCN, GATA2, GPC3, GREM1 (Promoter region deletion/duplication testing only), HOXB13 (c.251G>A, p.Gly84Glu), HRAS,  KIT, MAX, MEN1, MET, MITF (c.952G>A, p.Glu318Lys variant only), MLH1, MSH2, MSH3, MSH6, MUTYH, NBN, NF1, NF2, NTHL1, PALB2, PDGFRA, PHOX2B, PMS2, POLD1, POLE, POT1, PRKAR1A, PTCH1, PTEN, RAD50, RAD51C, RAD51D, RB1, RECQL4, RET, RNF43, RUNX1, SDHAF2, SDHA (sequence changes only), SDHB, SDHC, SDHD, SMAD4, SMARCA4, SMARCB1, SMARCE1, STK11, SUFU, TERC, TERT, TMEM127, TP53, TSC1, TSC2, VHL, WRN and WT1.    08/20/2020 Surgery   Right lumpectomy Jan): no residual carcinoma, 7 right axillary lymph nodes negative for carcinoma.   09/10/2020 - 04/15/2021 Chemotherapy   Patient is on Treatment Plan : BREAST Trastuzumab  + Pertuzumab  q21d      09/26/2020 - 11/12/2020 Radiation Therapy   Adjuvant radiation   01/21/2021 -  Anti-estrogen oral therapy   Letrozole      CHIEF COMPLIANT: Follow-up on anastrozole  therapy  HISTORY OF PRESENT ILLNESS: [name] is a 53 year old who is currently on antiestrogen therapy with anastrozole .  She reports that she is tolerating it significantly better than letrozole .  Body aches and pains are quite minimal. She is going to undergo blood work with her primary care physician coming up.  She denies any lumps or nodules in the breasts.  ALLERGIES:  is allergic to amoxicillin, ciprofloxacin, and penicillins.  MEDICATIONS:  Current Outpatient Medications  Medication Sig Dispense Refill   amLODipine  (NORVASC ) 2.5 MG tablet TAKE 1 TABLET DAILY 90 tablet 3   Biotin 10000 MCG TBDP 1,000 mcg. (Patient taking differently: 10,000 mcg.)     Cholecalciferol (VITAMIN D -3 PO) Take 5,000 Units by mouth daily.     MAGNESIUM GLYCINATE PO Take by mouth.     mefloquine  (LARIAM ) 250 MG tablet Take 1 tablet (250 mg total) by mouth every 7 (seven) days. Take one tab weekly starting on 02/11/24 7 tablet 0   neomycin -polymyxin-hydrocortisone (CORTISPORIN) OTIC solution Place 4 drops  into the left ear 3 (three) times daily. For 7 days 10 mL 0   SYNTHROID  75 MCG tablet TAKE 1 TABLET DAILY  BEFORE BREAKFAST 90 tablet 3   vitamin B-12 (CYANOCOBALAMIN) 1000 MCG tablet Take 1,000 mcg by mouth daily.     anastrozole  (ARIMIDEX ) 1 MG tablet Take 1 tablet (1 mg total) by mouth every other day.     No current facility-administered medications for this visit.    PHYSICAL EXAMINATION: ECOG PERFORMANCE STATUS: 1 - Symptomatic but completely ambulatory  Vitals:   02/09/24 0807  BP: (!) 145/85  Pulse: 92  Resp: 18  Temp: 98.6 F (37 C)  SpO2: 100%   Filed Weights   02/09/24 0807  Weight: 195 lb 9.6 oz (88.7 kg)    LABORATORY DATA:  I have reviewed the data as listed    Latest Ref Rng & Units 01/31/2024    7:35 AM 10/14/2023    9:46 AM 08/03/2023    9:31 AM  CMP  Glucose 70 - 99 mg/dL 98   85   BUN 6 - 20 mg/dL 12   12   Creatinine 9.55 - 1.00 mg/dL 9.12   9.12   Sodium 864 - 145 mmol/L 141   141   Potassium 3.5 - 5.1 mmol/L 4.5   4.5   Chloride 98 - 111 mmol/L 108   103   CO2 22 - 32 mmol/L 29   23   Calcium 8.9 - 10.3 mg/dL 9.5   9.6   Total Protein 6.5 - 8.1 g/dL 7.6  7.2  7.6   Total Bilirubin 0.0 - 1.2 mg/dL 0.5  0.3  0.5   Alkaline Phos 38 - 126 U/L 96  123  105   AST 15 - 41 U/L 21  22  42   ALT 0 - 44 U/L 15  17  21      Lab Results  Component Value Date   WBC 4.3 01/31/2024   HGB 14.4 01/31/2024   HCT 41.8 01/31/2024   MCV 90.3 01/31/2024   PLT 222 01/31/2024   NEUTROABS 2.8 01/31/2024    ASSESSMENT & PLAN:  Malignant neoplasm of upper-outer quadrant of right breast in female, estrogen receptor negative (HCC) 03/28/2020:Patient palpated a right breast mass x2 wks. Mammogram and US  showed a 2.0cm mass at the 10 o'clock position in the right breast and two enlarged right axillary lymph nodes, 2.0cm and 1.7cm. 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 Satellite lesion biopsy: DCIS ER 0%, PR 0%   Treatment plan: 1. Neoadjuvant chemotherapy with [facility] Perjeta  6 cycles followed by Herceptin   Perjeta  maintenance for 1 year completed 04/15/2021 2. 08/20/2020:Right lumpectomy Jan): no residual carcinoma, 7 right axillary lymph nodes negative for carcinoma. 3. Adjuvant radiation: 09/26/2020-11/12/2020 4.  Antiestrogen therapy with letrozole  (ER 10% weakly positive) started 11/2020 ----------------------------------------------------------------------------------------------------------------------------------------- Current treatment: Initially letrozole  switched to anastrozole  half a tablet daily 1.  Joint stiffness and achiness especially in the fingers: Marked improvement 2. increased cholesterol: Closely being monitored.   Breast cancer surveillance: Mammogram 03/20/2023: Benign breast density category B   Her daughter received full scholarship to [employer].  She is doing a double major. She's planning to take a trip to [place] and [place] in the next week.   Return to clinic in 1 year for follow-up     No orders of the defined types were placed in this encounter.  The patient has a good understanding of the overall plan. she agrees with it. she will call with any problems that may develop before the next visit here. Total time spent: 30 mins including face to face time and time spent for planning, charting and co-ordination of care   Naomi MARLA Chad, MD 02/09/24

## 2024-02-09 NOTE — Assessment & Plan Note (Signed)
 03/28/2020:Patient palpated a right breast mass x2 wks. Mammogram and US  showed a 2.0cm mass at the 10 o'clock position in the right breast and two enlarged right axillary lymph nodes, 2.0cm and 1.7cm. 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 Satellite lesion biopsy: DCIS ER 0%, PR 0%   Treatment plan: 1. Neoadjuvant chemotherapy with [facility] Perjeta  6 cycles followed by Herceptin  Perjeta  maintenance for 1 year completed 04/15/2021 2. 08/20/2020:Right lumpectomy Jan): no residual carcinoma, 7 right axillary lymph nodes negative for carcinoma. 3. Adjuvant radiation: 09/26/2020-11/12/2020 4.  Antiestrogen therapy with letrozole  (ER 10% weakly positive) started 11/2020 ----------------------------------------------------------------------------------------------------------------------------------------- Current treatment: Initially letrozole  switched to anastrozole  half a tablet daily 1.  Joint stiffness and achiness especially in the fingers: Marked improvement 2. increased cholesterol: Closely being monitored.   If the cholesterol does not get better in spite of her best efforts we might have to stop her anastrozole  therapy.  I suspect the elevated cholesterol is related to anastrozole    Breast cancer surveillance: 1.  Breast exam 02/09/2024: Benign 2. Mammogram 03/20/2023: Benign breast density category B   Her daughter received full scholarship to [employer].   Return to clinic in 1 year for follow-up

## 2024-02-11 ENCOUNTER — Other Ambulatory Visit: Payer: Self-pay | Admitting: Hematology and Oncology

## 2024-02-11 DIAGNOSIS — Z853 Personal history of malignant neoplasm of breast: Secondary | ICD-10-CM

## 2024-02-14 ENCOUNTER — Other Ambulatory Visit: Payer: Self-pay | Admitting: Internal Medicine

## 2024-02-14 ENCOUNTER — Other Ambulatory Visit

## 2024-02-14 DIAGNOSIS — E039 Hypothyroidism, unspecified: Secondary | ICD-10-CM

## 2024-02-14 DIAGNOSIS — R7309 Other abnormal glucose: Secondary | ICD-10-CM

## 2024-02-14 MED ORDER — ATOVAQUONE-PROGUANIL HCL 250-100 MG PO TABS: 1.0000 | ORAL_TABLET | Freq: Every day | ORAL | 0 refills | Status: AC

## 2024-02-15 LAB — TSH+FREE T4
Free T4: 1.32 ng/dL (ref 0.82–1.77)
TSH: 2.27 u[IU]/mL (ref 0.450–4.500)

## 2024-02-15 LAB — HEMOGLOBIN A1C
Est. average glucose Bld gHb Est-mCnc: 126 mg/dL
Hgb A1c MFr Bld: 6 % — ABNORMAL HIGH (ref 4.8–5.6)

## 2024-02-19 ENCOUNTER — Ambulatory Visit: Payer: Self-pay | Admitting: Internal Medicine

## 2024-03-02 ENCOUNTER — Ambulatory Visit: Payer: Self-pay

## 2024-03-02 NOTE — Telephone Encounter (Signed)
 FYI Only or Action Required?: FYI only for provider.  Patient was last seen in primary care on 02/07/2024 by Jarold Medici, MD.  Called Nurse Triage reporting Urinary Symptoms.  Symptoms began several days ago.  Interventions attempted: Rest, hydration, or home remedies.  Symptoms are: gradually worsening.  Triage Disposition: See Physician Within 24 Hours (overriding See HCP Within 4 Hours (Or PCP Triage))  Patient/caregiver understands and will follow disposition?: Yes  Copied from CRM #[id number]. Topic: Clinical - Medical Advice >> Mar 02, 2024 11:37 AM Amy B wrote: Reason for CRM: [place] out of country and had to take malaria medicine while gone.  Urine is now dark and she is concerned.  Patient would like to know if she needs to be seen or have lab work done.  She requests a call back to discuss. Reason for Disposition  Side (flank) or lower back pain present  Answer Assessment - Initial Assessment Questions Pt recently went out of country and was taking malaria medication. Her urine is now dark and tea colored. States she had an episode of chills. Pt worried about kidney function.   1. SYMPTOM: What's the main symptom you're concerned about? (e.g., frequency, incontinence)     Dark urine 2. ONSET: When did the  dark urine  start?     Approx 1 week ago  3. PAIN: Is there any pain? If Yes, ask: How bad is it? (Scale: 1-10; mild, moderate, severe)     denies 4. CAUSE: What do you think is causing the symptoms?     Unsure but maybe related to malaria med 5. OTHER SYMPTOMS: Do you have any other symptoms? (e.g., blood in urine, fever, flank pain, pain with urination)     Lower back; back sides flank pain.  Protocols used: Urinary Symptoms-A-AH

## 2024-03-03 ENCOUNTER — Ambulatory Visit: Admitting: Medical

## 2024-03-03 VITALS — BP 130/80 | HR 69 | Temp 97.9°F | Wt 193.4 lb

## 2024-03-03 DIAGNOSIS — Z789 Other specified health status: Secondary | ICD-10-CM

## 2024-03-03 DIAGNOSIS — R82998 Other abnormal findings in urine: Secondary | ICD-10-CM

## 2024-03-03 DIAGNOSIS — R8290 Unspecified abnormal findings in urine: Secondary | ICD-10-CM | POA: Diagnosis not present

## 2024-03-03 LAB — POCT URINALYSIS DIP (PROADVANTAGE DEVICE)
Bilirubin, UA: NEGATIVE
Glucose, UA: NEGATIVE mg/dL
Ketones, POC UA: NEGATIVE mg/dL
Leukocytes, UA: NEGATIVE
Nitrite, UA: NEGATIVE
Protein Ur, POC: NEGATIVE mg/dL
Specific Gravity, Urine: 1.02
Urobilinogen, Ur: NEGATIVE
pH, UA: 6 (ref 5.0–8.0)

## 2024-03-03 LAB — BASIC METABOLIC PANEL WITH GFR
BUN/Creatinine Ratio: 11 (ref 9–23)
BUN: 9 mg/dL (ref 6–24)
CO2: 24 mmol/L (ref 20–29)
Calcium: 9.4 mg/dL (ref 8.7–10.2)
Chloride: 103 mmol/L (ref 96–106)
Creatinine, Ser: 0.83 mg/dL (ref 0.57–1.00)
Glucose: 88 mg/dL (ref 70–99)
Potassium: 4.5 mmol/L (ref 3.5–5.2)
Sodium: 140 mmol/L (ref 134–144)
eGFR: 83 mL/min/1.73 (ref >59)
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
## 2024-03-05 ENCOUNTER — Ambulatory Visit: Payer: Self-pay | Admitting: Medical

## 2024-03-05 LAB — URINE CULTURE

## 2024-03-05 NOTE — Progress Notes (Signed)
 Results thru my chart

## 2024-03-09 ENCOUNTER — Ambulatory Visit (INDEPENDENT_AMBULATORY_CARE_PROVIDER_SITE_OTHER): Admitting: Orthopaedic Surgery

## 2024-03-09 DIAGNOSIS — G8929 Other chronic pain: Secondary | ICD-10-CM

## 2024-03-09 DIAGNOSIS — M25561 Pain in right knee: Secondary | ICD-10-CM | POA: Diagnosis not present

## 2024-03-09 DIAGNOSIS — M1711 Unilateral primary osteoarthritis, right knee: Secondary | ICD-10-CM | POA: Diagnosis not present
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
## 2024-03-10 ENCOUNTER — Ambulatory Visit (HOSPITAL_BASED_OUTPATIENT_CLINIC_OR_DEPARTMENT_OTHER): Admitting: Orthopaedic Surgery

## 2024-03-10 DIAGNOSIS — M1711 Unilateral primary osteoarthritis, right knee: Secondary | ICD-10-CM

## 2024-03-10 NOTE — Progress Notes (Signed)
 Chief Complaint: Right knee pain     History of Present Illness:    Shannon Kane is a 53 y.o. female presents today as referral from a partner Dr. Vernetta for additional discussion of right knee.  She has status post ACL reconstruction many years prior when she was a younger girl and since that time has had some pain and instability symptoms.  This has come slowly and she does still have some days where she does not actively have pain.  She is still very active and enjoys doing 5K's of which she does a mixture of running and walking.  She did go to [employer] for college.  She is here today for discussion of the knee    PMH/PSH/Family History/Social History/Meds/Allergies:    Past Medical History:  Diagnosis Date   Abnormal glucose    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 Lynwood Nasuti   Hypertension    Hyperthyroidism    in the past   Hypothyroidism    Iron deficiency anemia    Palpitation    Palpitations 10/15/2023   Personal history of chemotherapy    Personal history of radiation therapy    Spontaneous ecchymoses    Tachycardia    Vitamin D  deficiency    Past Surgical History:  Procedure Laterality Date   BREAST BIOPSY     BREAST EXCISIONAL BIOPSY     BREAST LUMPECTOMY Right 08/20/2020   w/ rad w/ chemo   BREAST LUMPECTOMY WITH RADIOACTIVE SEED LOCALIZATION Right 08/20/2020   Procedure: RADIOACTIVE SEED GUIDED RIGHT BREAST LUMPECTOMY;  Surgeon: Vernetta Berg, MD;  Location: [facility];  Service: General;  Laterality: Right;   CHOLECYSTECTOMY     COLONOSCOPY  05/18/2018   KNEE SURGERY     PORT-A-CATH REMOVAL N/A 04/30/2021   Procedure: REMOVAL PORT-A-CATH;  Surgeon: Vernetta Berg, MD;  Location: [facility];  Service: General;  Laterality: N/A;   PORTACATH PLACEMENT Left  04/15/2020   Procedure: INSERTION PORT-A-CATH;  Surgeon: Vernetta Berg, MD;  Location: [facility];  Service: General;  Laterality: Left;   RADIOACTIVE SEED GUIDED AXILLARY SENTINEL LYMPH NODE Right 08/20/2020   Procedure: RADIOACTIVE SEED TARGETED RIGHT AXILLARY LYMPH NODE DISSECTION;  Surgeon: Vernetta Berg, MD;  Location: [facility];  Service: General;  Laterality: Right;   TONSILLECTOMY     Social History   Socioeconomic History   Marital status: Married    Spouse name: Not on file   Number of children: Not on file   Years of education: Not on file   Highest education level: Bachelor's degree (e.g., BA, AB, BS)  Occupational History   Not on file  Tobacco Use   Smoking status: Never   Smokeless tobacco: Never  Vaping Use   Vaping status: Never Used  Substance and Sexual Activity   Alcohol use: Yes    Alcohol/week: 0.0 standard drinks of alcohol    Comment: social   Drug use: Never   Sexual activity: Not Currently    Birth control/protection: None  Other Topics Concern   Not on file  Social History Narrative   Not on file   Social Drivers of Health   Financial Resource Strain: Low Risk  (03/03/2024)   Overall Financial Resource Strain (CARDIA)    Difficulty of Paying Living Expenses: Not  hard at all  Food Insecurity: No Food Insecurity (03/03/2024)   Hunger Vital Sign    Worried About Running Out of Food in the Last Year: Never true    Ran Out of Food in the Last Year: Never true  Transportation Needs: No Transportation Needs (03/03/2024)   PRAPARE - Administrator, Civil Service (Medical): No    Lack of Transportation (Non-Medical): No  Physical Activity: Sufficiently Active (03/03/2024)   Exercise Vital Sign    Days of Exercise per Week: 5 days    Minutes of Exercise per Session: 50 min  Stress: No Stress Concern Present (03/03/2024)   [employer] of Occupational Health - Occupational Stress Questionnaire    Feeling of Stress:  Not at all  Social Connections: Socially Integrated (03/03/2024)   Social Connection and Isolation Panel    Frequency of Communication with Friends and Family: More than three times a week    Frequency of Social Gatherings with Friends and Family: More than three times a week    Attends Religious Services: More than 4 times per year    Active Member of [employer] or Organizations: Yes    Attends Engineer, structural: More than 4 times per year    Marital Status: Married   Family History  Problem Relation Age of Onset   Diabetes Mother    Hypertension Mother    Leukemia Father        dx > 50   Ovarian cancer Maternal Grandmother 50   Bone cancer Paternal Grandfather        dx unknown age   Cancer Maternal Uncle        Mouth and throad; dx > 50   Breast cancer Other        MGM's niece, dx unknown age   Cancer Maternal Uncle        unknown type; dx > 50   Allergies  Allergen Reactions   Amoxicillin     Tolerates ANCEF    Ciprofloxacin Itching   Penicillins Hives    Tolerates ANCEF     Current Outpatient Medications  Medication Sig Dispense Refill   amLODipine  (NORVASC ) 2.5 MG tablet TAKE 1 TABLET DAILY 90 tablet 3   anastrozole  (ARIMIDEX ) 1 MG tablet Take 1 tablet (1 mg total) by mouth every other day.     atovaquone-proguanil (MALARONE) 250-100 MG TABS tablet Take 1 tablet by mouth daily. Start 1-2 days prior to travel and continued 7 days after return 20 tablet 0   Biotin 10000 MCG TBDP 1,000 mcg.     Cholecalciferol (VITAMIN D -3 PO) Take 5,000 Units by mouth daily.     MAGNESIUM GLYCINATE PO Take by mouth.     sulfamethoxazole-trimethoprim (BACTRIM DS) 800-160 MG tablet Take 1 tablet by mouth 2 (two) times daily. 10 tablet 0   SYNTHROID  75 MCG tablet TAKE 1 TABLET DAILY BEFORE BREAKFAST 90 tablet 3   vitamin B-12 (CYANOCOBALAMIN) 1000 MCG tablet Take 1,000 mcg by mouth daily.     No current facility-administered medications for this visit.   No results  found.  Review of Systems:   A ROS was performed including pertinent positives and negatives as documented in the HPI.  Physical Exam :   Constitutional: NAD and appears stated age Neurological: Alert and oriented Psych: Appropriate affect and cooperative   Comprehensive Musculoskeletal Exam:    Right knee with negative Lachman with mild effusion.  No joint line tenderness on today's exam.  There is minimal to no  crepitus.  Range of motion is from 0 to 125 degrees   Imaging:   Xray (4 views right knee): Status post ACL reconstruction with moderate knee osteoarthritis     I personally reviewed and interpreted the radiographs.   Assessment and Plan:   53 y.o. female with evidence of right knee moderate osteoarthritis.  Overall we did discuss that post ACL arthritis does often have less significant arthritic type pain than standard primary osteoarthritis.  Given this overall she is looking to discuss additional treatment options that would be nonsurgical.  I did discuss that overall I do believe that she would benefit from PRP in the right knee.  She will consider this.  We will plan to place a referral for this  -Plan to replace referral to Dr. Burnetta for right knee PRP  I personally saw and evaluated the patient, and participated in the management and treatment plan.  Elspeth Parker, MD Attending Physician, Orthopedic Surgery  This document was dictated using Dragon voice recognition software. A reasonable attempt at proof reading has been made to minimize errors.

## 2024-03-10 NOTE — Addendum Note (Signed)
 Addended by: WOLFGANG CONLEY HERO on: 03/10/2024 04:24 PM   Modules accepted: Orders

## 2024-03-20 ENCOUNTER — Ambulatory Visit
Admission: RE | Admit: 2024-03-20 | Discharge: 2024-03-20 | Disposition: A | Discharge status: Home / Self Care | Source: Ambulatory Visit | Attending: Hematology and Oncology | Admitting: Hematology and Oncology

## 2024-03-20 ENCOUNTER — Encounter: Payer: Self-pay | Admitting: Radiology

## 2024-03-20 DIAGNOSIS — Z853 Personal history of malignant neoplasm of breast: Secondary | ICD-10-CM

## 2024-03-27 ENCOUNTER — Ambulatory Visit: Attending: Hematology and Oncology

## 2024-03-27 VITALS — Wt 197.2 lb

## 2024-03-27 DIAGNOSIS — Z483 Aftercare following surgery for neoplasm: Secondary | ICD-10-CM | POA: Insufficient documentation

## 2024-03-27 NOTE — Therapy (Signed)
 OUTPATIENT PHYSICAL THERAPY SOZO SCREENING NOTE   Patient Name: Shannon Kane MRN: [id number] DOB:1970-09-09, 53 y.o., female Today's Date: 03/27/2024  PCP: Jarold Medici, MD REFERRING PROVIDER: Odean Potts, MD   PT End of Session - 03/27/24 [phone]     Visit Number 9   # unchanged due to screen only   PT Start Time 0821    PT Stop Time 0828    PT Time Calculation (min) 7 min    Activity Tolerance Patient tolerated treatment well    Behavior During Therapy [facility] for tasks assessed/performed          Past Medical History:  Diagnosis Date   Abnormal glucose    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 Lynwood Nasuti   Hypertension    Hyperthyroidism    in the past   Hypothyroidism    Iron deficiency anemia    Palpitation    Palpitations 10/15/2023   Personal history of chemotherapy    Personal history of radiation therapy    Spontaneous ecchymoses    Tachycardia    Vitamin D  deficiency    Past Surgical History:  Procedure Laterality Date   BREAST BIOPSY     BREAST EXCISIONAL BIOPSY     BREAST LUMPECTOMY Right 08/20/2020   w/ rad w/ chemo   BREAST LUMPECTOMY WITH RADIOACTIVE SEED LOCALIZATION Right 08/20/2020   Procedure: RADIOACTIVE SEED GUIDED RIGHT BREAST LUMPECTOMY;  Surgeon: Vernetta Berg, MD;  Location: [facility];  Service: General;  Laterality: Right;   CHOLECYSTECTOMY     COLONOSCOPY  05/18/2018   KNEE SURGERY     PORT-A-CATH REMOVAL N/A 04/30/2021   Procedure: REMOVAL PORT-A-CATH;  Surgeon: Vernetta Berg, MD;  Location: [facility];  Service: General;  Laterality: N/A;   PORTACATH PLACEMENT Left 04/15/2020   Procedure: INSERTION PORT-A-CATH;  Surgeon: Vernetta Berg, MD;  Location: [facility];  Service: General;  Laterality: Left;   RADIOACTIVE SEED GUIDED  AXILLARY SENTINEL LYMPH NODE Right 08/20/2020   Procedure: RADIOACTIVE SEED TARGETED RIGHT AXILLARY LYMPH NODE DISSECTION;  Surgeon: Vernetta Berg, MD;  Location: [facility];  Service: General;  Laterality: Right;   TONSILLECTOMY     Patient Active Problem List   Diagnosis Date Noted   Counseling about travel 02/08/2024   Acute swimmer's ear of left side 02/08/2024   Class 1 obesity due to excess calories with serious comorbidity and body mass index (BMI) of 30.0 to 30.9 in adult 02/07/2024   Chronic pain of right knee 02/07/2024   Palpitations 10/15/2023   Encounter for general adult medical examination w/o abnormal findings 08/03/2023   Other abnormal glucose 01/20/2023   Trigger middle finger of left hand 01/20/2023   Constipation 07/15/2021   Chronic idiopathic constipation 07/15/2021   Dysphagia 07/15/2021   Gastroesophageal reflux disease 07/15/2021   Uterine leiomyoma 04/19/2020   Genetic testing 04/19/2020   Family history of ovarian cancer 04/03/2020   Family history of leukemia 04/03/2020   Family history of breast cancer 04/03/2020   Malignant neoplasm of upper-outer quadrant of right breast in female, estrogen receptor negative (HCC) 03/28/2020   Overweight (BMI 25.0-29.9) 12/05/2019   Essential hypertension, benign 05/29/2018   Iritis of left eye 05/29/2018   Iron deficiency anemia due to chronic blood loss 05/29/2018   Primary hypothyroidism 05/29/2018    REFERRING  DIAG: right breast cancer at risk for lymphedema  THERAPY DIAG:  Aftercare following surgery for neoplasm  PERTINENT HISTORY: Patient was diagnosed on 03/14/2020 with right grade 2 invasive ductal carcinoma breast cancer.  She underwent neoadjuvant chemo followed by a right lumpectomy and targeted node dissection (7 negative lymph nodes) on 08/20/2020. It is ER/PR negative and HER2 positive with a Ki67 of 30%. She has a positive axillary lymph node. Still has port on the left side for infusion for Herception,  Projeta. Port removed on 04/30/2021   PRECAUTIONS: right UE Lymphedema risk, None  SUBJECTIVE: Pt returns for her 6 month L-Dex screen.   PAIN:  Are you having pain? No  SOZO SCREENING: Patient was assessed today using the SOZO machine to determine the lymphedema index score. This was compared to her baseline score. It was determined that she is within the recommended range when compared to her baseline and no further action is needed at this time. She will continue SOZO screenings. These are done every 3 months for 2 years post operatively followed by every 6 months for 2 years, and then annually.     L-DEX FLOWSHEETS - 03/27/24 0800       L-DEX LYMPHEDEMA SCREENING   Measurement Type Unilateral    L-DEX MEASUREMENT EXTREMITY Upper Extremity    POSITION  Standing    DOMINANT SIDE Left    At Risk Side Right    BASELINE SCORE (UNILATERAL) 4.2    L-DEX SCORE (UNILATERAL) -3.9    VALUE CHANGE (UNILAT) -8.1          P: Pt has one more 6 month L-Dex screen and then can transition to annual.    Shannon Kane, PTA 03/27/2024, 8:28 AM

## 2024-03-28 ENCOUNTER — Encounter: Payer: Self-pay | Admitting: Sports Medicine

## 2024-03-28 ENCOUNTER — Ambulatory Visit: Admitting: Sports Medicine

## 2024-03-28 DIAGNOSIS — G8929 Other chronic pain: Secondary | ICD-10-CM

## 2024-03-28 DIAGNOSIS — M1711 Unilateral primary osteoarthritis, right knee: Secondary | ICD-10-CM

## 2024-03-28 DIAGNOSIS — M25561 Pain in right knee: Secondary | ICD-10-CM | POA: Diagnosis not present

## 2024-03-28 NOTE — Progress Notes (Signed)
 Shannon Kane - 53 y.o. female MRN [id number]  Date of birth: Mar 25, 1971  Office Visit Note: Visit Date: 03/28/2024 PCP: Jarold Medici, MD Referred by: Genelle Standing, MD  Subjective: Chief Complaint  Patient presents with   Right Knee - Pain   HPI: Shannon Kane is a pleasant 53 y.o. female who presents today for discussion on PRP and other treatment for R-knee OA.  She is a very fit and active individual.  She has had multiple surgeries for the knee which started back when she tore her ACL in junior high.  She had reconstructive knee surgery in college as well.  She did play softball in college.  She did feel like she exacerbated her knee doing a 5K mud run when the knee got stuck behind her.  She does have locking catching and more sensation of the medial joint of the knee.  She is very holistic and takes care of her body.  She does not take any medication for this, she has not had any injection therapy.  She is interested in trialing PRP.  She does wear a compressive knee sleeve with activity.  Pertinent ROS were reviewed with the patient and found to be negative unless otherwise specified above in HPI.   Assessment & Plan: Visit Diagnoses:  1. Unilateral primary osteoarthritis, right knee   2. Chronic pain of right knee    Plan: Impression is acute on chronic right knee pain with moderate+ tricompartmental arthritic change, in the setting of previous ACL reconstruction. Shannon Kane is very active on the knee and does a great job remaining holistic in terms of treatment for the knee and overall body.  We had a discussion regarding PRP therapy with the overall pathophysiology, nature of the injection therapy as well as expected recovery and RTP.  She is interested in proceeding with this, a handout was provided regarding preprocedure, and procedure details and post procedure discussion.  She will continue to review this and reach out if any questions in the interim.  Did reiterate the need to  stop all anti-inflammatories for 10 days before as well as this and ice for 2 weeks post.  We will plan to get her started in a few sessions of formalized physical therapy at the 2-week mark from her injection.  Okay for heat, Tylenol  and/or massage following.  She will schedule PRP at her leisure.  We discussed then following up at the 10-week mark to see effect and see if there is need for repeat PRP injection therapy.  Follow-up: Return for Schedule for R-knee PRP .   Meds & Orders: No orders of the defined types were placed in this encounter.  No orders of the defined types were placed in this encounter.    Procedures: No procedures performed      Clinical History: No specialty comments available.  She reports that she has never smoked. She has never used smokeless tobacco.  Recent Labs    08/03/23 0931 02/14/24 0841  HGBA1C 6.3* 6.0*    Objective:   Vital Signs: LMP 09/16/2019   Physical Exam  Gen: Well-appearing, in no acute distress; non-toxic CV: Well-perfused. Warm.  Resp: Breathing unlabored on room air; no wheezing. Psych: Fluid speech in conversation; appropriate affect; normal thought process  Ortho Exam - Right knee: There is a trace effusion of the right knee.  There are multiple well-healed scars from previous ligamental reconstruction.  There is mild patellofemoral crepitus.  Positive TTP over the medial joint line.  Range  of motion from 0-115 degrees with pain at endrange flexion.  Negative Thessaly's test today.  Imaging:  *Knee x-ray report from Dr. Damian visit on 10/23 have report of tricompartment arthritis with osteophytes in all 3 compartments and significant joint space narrowing. There are interference screws in the femur and tibia from her previous ACL reconstruction.   Past Medical/Family/Surgical/Social History: Medications & Allergies reviewed per EMR, new medications updated. Patient Active Problem List   Diagnosis Date Noted    Counseling about travel 02/08/2024   Acute swimmer's ear of left side 02/08/2024   Class 1 obesity due to excess calories with serious comorbidity and body mass index (BMI) of 30.0 to 30.9 in adult 02/07/2024   Chronic pain of right knee 02/07/2024   Palpitations 10/15/2023   Encounter for general adult medical examination w/o abnormal findings 08/03/2023   Other abnormal glucose 01/20/2023   Trigger middle finger of left hand 01/20/2023   Constipation 07/15/2021   Chronic idiopathic constipation 07/15/2021   Dysphagia 07/15/2021   Gastroesophageal reflux disease 07/15/2021   Uterine leiomyoma 04/19/2020   Genetic testing 04/19/2020   Family history of ovarian cancer 04/03/2020   Family history of leukemia 04/03/2020   Family history of breast cancer 04/03/2020   Malignant neoplasm of upper-outer quadrant of right breast in female, estrogen receptor negative (HCC) 03/28/2020   Overweight (BMI 25.0-29.9) 12/05/2019   Essential hypertension, benign 05/29/2018   Iritis of left eye 05/29/2018   Iron deficiency anemia due to chronic blood loss 05/29/2018   Primary hypothyroidism 05/29/2018   Past Medical History:  Diagnosis Date   Abnormal glucose    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 Lynwood Nasuti   Hypertension    Hyperthyroidism    in the past   Hypothyroidism    Iron deficiency anemia    Palpitation    Palpitations 10/15/2023   Personal history of chemotherapy    Personal history of radiation therapy    Spontaneous ecchymoses    Tachycardia    Vitamin D  deficiency    Family History  Problem Relation Age of Onset   Diabetes Mother    Hypertension Mother    Leukemia Father        dx > 50   Ovarian cancer Maternal Grandmother 68   Bone cancer Paternal Grandfather        dx unknown age   Cancer  Maternal Uncle        Mouth and throad; dx > 50   Breast cancer Other        MGM's niece, dx unknown age   Cancer Maternal Uncle        unknown type; dx > 50   Past Surgical History:  Procedure Laterality Date   BREAST BIOPSY     BREAST EXCISIONAL BIOPSY     BREAST LUMPECTOMY Right 08/20/2020   w/ rad w/ chemo   BREAST LUMPECTOMY WITH RADIOACTIVE SEED LOCALIZATION Right 08/20/2020   Procedure: RADIOACTIVE SEED GUIDED RIGHT BREAST LUMPECTOMY;  Surgeon: Vernetta Berg, MD;  Location: [facility] OR;  Service: General;  Laterality: Right;   CHOLECYSTECTOMY     COLONOSCOPY  05/18/2018   KNEE SURGERY     PORT-A-CATH REMOVAL N/A 04/30/2021   Procedure: REMOVAL PORT-A-CATH;  Surgeon: Vernetta Berg, MD;  Location: [facility];  Service: General;  Laterality: N/A;   PORTACATH PLACEMENT Left 04/15/2020   Procedure: INSERTION PORT-A-CATH;  Surgeon: Vernetta Berg, MD;  Location: [facility];  Service: General;  Laterality: Left;   RADIOACTIVE SEED GUIDED AXILLARY SENTINEL LYMPH NODE Right 08/20/2020   Procedure: RADIOACTIVE SEED TARGETED RIGHT AXILLARY LYMPH NODE DISSECTION;  Surgeon: Vernetta Berg, MD;  Location: [facility];  Service: General;  Laterality: Right;   TONSILLECTOMY     Social History   Occupational History   Not on file  Tobacco Use   Smoking status: Never   Smokeless tobacco: Never  Vaping Use   Vaping status: Never Used  Substance and Sexual Activity   Alcohol use: Yes    Alcohol/week: 0.0 standard drinks of alcohol    Comment: social   Drug use: Never   Sexual activity: Not Currently    Birth control/protection: None

## 2024-03-28 NOTE — Progress Notes (Signed)
 Patient says that her pain is currently in the medial knee. She is limited in knee extension compared to the unaffected left knee. She says that she does not take medicine for her knee pain; she will occasionally use her e-stim unit, but is otherwise not doing anything at home to treat her pain. She is here to discuss PRP injection.

## 2024-03-28 NOTE — Patient Instructions (Signed)
 Dr. Burnetta' Instructions and What to Expect for PRP Injections:  Platelet-rich plasma is used in musculoskeletal medicine to focus your own body's ability to heal. It has several well-done published research trials (RCT) which demonstrate both its effectiveness and safety in many musculoskeletal conditions, including osteoarthritis, tendinopathies, partial tendon tears, and damaged vertebral discs. PRP has been in clinical use since the 1990's. Many people know that platelets form a clot if there is a cut in the skin. It turns out that platelets do not only form a clot, but they also start the body's own repair process. When platelets activate to form a clot, they release alpha granules which have hundreds of chemical messengers in them that initiate and organize repair to the damaged tissue. Precisely placing PRP into the site of injury will initiate the healing process by activating on the damaged cartilage, bone or tendon. This is an inflammatory process, and inflammation is the vital first phase of healing.  What to expect and how to prepare for PRP:   Depending on the procedure, you may need to arrange for a driver to bring you home (i.e. procedure on right/driving leg). IF you are having a lower extremity procedure, we can provide crutches as needed, but these are not routinely needed.   10 days prior to the procedure: Stop taking anti-inflammatory drugs like Ibuprofen/Motrin, Advil, Naprosyn, Celebrex , or Meloxicam . Even aspirin should be stopped (but need to discuss this with Dr. Burnetta and your cardiologist beforehand). Let Dr. Burnetta know if you have been taking prednisone  or other corticosteroids in the last 30 days as this can negatively interfere with the PRP process.   The day before the procedure: thoroughly shower and clean your skin.    The day of the procedure: Wear loose-fitting clothing like sweatpants or shorts. If you are having an upper body procedure wear a top that can button or  zip up. Please show up at least 15 minutes early for your appointment, as we will need to take you back to draw your blood prior to the procedure.    Things to avoid prior to procedure: tobacco/nicotine, alcohol, fatty foods. Tobacco is a potent toxin and its use constricts small blood vessels which are needed for tissue repair. Tobacco/nicotine use will limit the effectiveness of any treatment and stopping tobacco use is one of the single greatest actions you can take to improve your health. Avoid toxins like alcohol, which inhibits and depresses the cells needed for tissue repair.  PRP will initiate healing and a productive inflammation, and PRP therapy will make the body part treated sore for the first 3-4 days up to two weeks. Anti-inflammatory drugs (i.e. ibuprofen, Naprosyn, Celebrex ) and corticosteroids such as prednisone  can blunt or stop this process, so it is important to not take any anti-inflammatory drugs for 10 days before getting PRP therapy, or for at least two weeks after PRP therapy.   Depending on the body part injected, you may be in a sling or on crutches for several days. Most of the time these are not needed, but it is important to allow time for the body part to rest after the injection. By keeping the body part treated relaxed and not loading the body part the PRP can bind in place and do its job. If you push the body part too early, this may make the PRP less effective.  What happens during the PRP procedure?  Platelet rich plasma is made by taking some of your blood and performing a  two-stage centrifuge process on it to concentrate the PRP. First, your blood is drawn into a syringe with a small amount of anti-coagulant in it (this is to keep the blood from clotting during this process). The amount of blood drawn is usually about 10-30 milliliters, depending on how much PRP is needed for the treatment. Then the blood is transferred in a sterile fashion into a centrifuge tube. It  is then centrifuged for the first cycle where the red blood cells are isolated and discarded. In the second centrifuge cycle, the platelet-rich fraction of the remaining plasma is concentrated and placed in a syringe. The skin at the injection site is numbed with a small amount of topical cooling spray. Dr. Burnetta will then precisely inject the PRP into the injury site using ultrasound guidance.  What to do after your procedure:   NO anti-inflammatories (Ibuprofen/Motrin, Aleve, Meloxicam , etc.) for 2 weeks after injection. It is OK to use Tylenol  only if needed. I can also prescribe you specific medicine to control any discomfort you may have after the procedure if needed.   NO applying ice to the affected area for 2 weeks after the injection. It is OK to use heat.   Rest the affected body part. By keeping the body part treated relaxed and not loading the body part the PRP can bind in place and do its job. Be sure to ask Dr. Burnetta specific post-injection rest and guidelines to follow following your injection, as these will differ slightly depending on what type of PRP injection you receive. In general:   Allow 3-4 days of rest from physical labor or repetitive activity for the affected body part. It is ok to move the area, but no lifting or strenuous activity. After 3 days, unless otherwise instructed, the treated body part should be used and slowly moved through its full range of motion. It will be sore, but you will not damage it by moving it, in fact it needs to move to heal.   No strenuous activity, lifting weights, or dedicated therapy until the 2-week mark from the injection. After 2 weeks from the injection, this is when it is most advantageous to start physical therapy.   After two weeks, you may begin returning to activity, but it is a good idea to avoid activities that specifically hurt you before being treated. Exercise is vital to good health and finding a way to cross train around your  injury is important not only for your physical health, but your mental health as well. Ask me about cross training options for your injury. Some brief (10 minutes or less) period of heat therapy will not hurt the rehab, but it is not required. Usually, depending on the initial injury, physical therapy is started from two weeks to three weeks after injection. Improvements in pain and function should be expected from 6 weeks to 12 weeks after injection and some injuries may require more than one treatment.

## 2024-04-05 ENCOUNTER — Other Ambulatory Visit: Payer: Self-pay

## 2024-04-05 ENCOUNTER — Ambulatory Visit: Admitting: Sports Medicine

## 2024-04-05 ENCOUNTER — Encounter: Payer: Self-pay | Admitting: Sports Medicine

## 2024-04-05 DIAGNOSIS — M25561 Pain in right knee: Secondary | ICD-10-CM

## 2024-04-05 DIAGNOSIS — M1711 Unilateral primary osteoarthritis, right knee: Secondary | ICD-10-CM

## 2024-04-05 DIAGNOSIS — G8929 Other chronic pain: Secondary | ICD-10-CM

## 2024-04-05 NOTE — Progress Notes (Signed)
   Procedure Note  Patient: Shannon Kane             Date of Birth: 02-25-1971           MRN: [id number]             Visit Date: 04/05/2024  Procedures: Visit Diagnoses:  1. Unilateral primary osteoarthritis, right knee   2. Chronic pain of right knee    Ultrasound-guided PRP Knee Injection, Right: After discussion on risks/benefits/indications, informed verbal consent was obtained and a timeout was performed. The patient was lying supine on exam table with knee bolster underneath affected knee for comfort. The patient's right knee was prepped with Chloraprep and alcohol swabs. Utilizing ultrasound-guidance with the probe in a transverse position, the patient's suprapatellar bursa was identified and the surrounding soft tissue area was anesthesized first with a 25G, 1.5 needle with approximately 3 cc of lidocaine  1% using sterile technique, but none was delivered into the knee joint or bursa itself. Following this, using ultrasound guidance via an in-plane approach, a 22G, 1.5 needle was directed into the suprapatellar pouch of the knee joint and subsequently injected intraarticularly with 5.5 cc of platelet-rich-plasma (leukocyte-poor). Visualization of injectate spread within the knee joint was visualized dynamically under ultrasound guidance. Patient tolerated the procedure well without immediate complications.   Kit: RegenLab: RegenPlasma; BCT-3 kit   *Post-PRP Injection Guidelines: No anti-inflammatories (ibuprofen/motrin, aleve, meloxicam, etc.) for 2 weeks.  No ice for 2 weeks.  Short prescription of tramadol may be written if pain is severe, call if needed. Appropriate rest / bracing / and timeframe for beginning therapy discussed and patient endorsed understanding.   - Referral sent for formalized physical therapy to begin 2 weeks after PRP - We will follow-up at about the 10-week mark to reevaluate and see if there is need for additional PRP injection therapy.   Lonell Sprang,  DO Primary Care Sports Medicine Physician  [facility]  This note was dictated using Dragon naturally speaking software and may contain errors in syntax, spelling, or content which have not been identified prior to signing this note.

## 2024-04-24 NOTE — Therapy (Incomplete)
 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 Lynwood Nasuti   Hypertension    Hyperthyroidism    in the past   Hypothyroidism    Iron deficiency anemia    Palpitation    Palpitations 10/15/2023   Personal history of chemotherapy    Personal history of radiation therapy    Spontaneous ecchymoses    Tachycardia    Vitamin D  deficiency    Past Surgical History:  Procedure Laterality Date   BREAST BIOPSY     BREAST EXCISIONAL BIOPSY     BREAST LUMPECTOMY Right 08/20/2020   w/ rad w/ chemo   BREAST LUMPECTOMY WITH RADIOACTIVE SEED LOCALIZATION Right 08/20/2020   Procedure: RADIOACTIVE SEED GUIDED RIGHT BREAST LUMPECTOMY;  Surgeon: Vernetta Berg, MD;  Location: [facility];  Service: General;  Laterality: Right;   CHOLECYSTECTOMY     COLONOSCOPY  05/18/2018   KNEE SURGERY     PORT-A-CATH REMOVAL N/A 04/30/2021   Procedure: REMOVAL PORT-A-CATH;  Surgeon: Vernetta Berg, MD;  Location: [facility];  Service: General;  Laterality: N/A;   PORTACATH PLACEMENT Left 04/15/2020   Procedure: INSERTION PORT-A-CATH;  Surgeon: Vernetta Berg, MD;  Location: [facility];  Service: General;  Laterality: Left;   RADIOACTIVE SEED GUIDED AXILLARY SENTINEL LYMPH NODE Right 08/20/2020   Procedure: RADIOACTIVE SEED TARGETED RIGHT AXILLARY LYMPH NODE DISSECTION;  Surgeon: Vernetta Berg, MD;  Location: [facility];  Service: General;  Laterality: Right;   TONSILLECTOMY     Patient Active Problem List   Diagnosis Date Noted   Counseling about travel 02/08/2024   Acute  swimmer's ear of left side 02/08/2024   Class 1 obesity due to excess calories with serious comorbidity and body mass index (BMI) of 30.0 to 30.9 in adult 02/07/2024   Chronic pain of right knee 02/07/2024   Palpitations 10/15/2023   Encounter for general adult medical examination w/o abnormal findings 08/03/2023   Other abnormal glucose 01/20/2023   Trigger middle finger of left hand 01/20/2023   Constipation 07/15/2021   Chronic idiopathic constipation 07/15/2021   Dysphagia 07/15/2021   Gastroesophageal reflux disease 07/15/2021   Uterine leiomyoma 04/19/2020   Genetic testing 04/19/2020   Family history of ovarian cancer 04/03/2020   Family history of leukemia 04/03/2020   Family history of breast cancer 04/03/2020   Malignant neoplasm of upper-outer quadrant of right breast in female, estrogen receptor negative (HCC) 03/28/2020   Overweight (BMI 25.0-29.9) 12/05/2019   Essential hypertension, benign 05/29/2018   Iritis of left eye 05/29/2018   Iron deficiency anemia due to chronic blood loss 05/29/2018   Primary hypothyroidism 05/29/2018    PCP: Catheryn Slocumb, MD  REFERRING PROVIDER: Lonell Sprang, DO  REFERRING DIAG: [phone] (ICD-10-CM) - Chronic pain of right knee M17.11 (ICD-10-CM) - Unilateral primary osteoarthritis, right knee  THERAPY DIAG:  No diagnosis found.  Rationale for Evaluation and Treatment: Rehabilitation  ONSET DATE: ***  SUBJECTIVE:   SUBJECTIVE STATEMENT: ***  PERTINENT HISTORY: *** PAIN:  Are you having pain? Yes: NPRS scale: *** Pain location: *** Pain description: *** Aggravating factors: *** Relieving factors: ***  PRECAUTIONS: {Therapy precautions:24002}  RED FLAGS: {PT Red Flags:29287}   WEIGHT BEARING RESTRICTIONS: {Yes ***/No:24003}  FALLS:  Has patient fallen in last 6 months? {fallsyesno:27318}  LIVING ENVIRONMENT: Lives with: {[facility] lives with:25569::lives with their family} Lives in: {Lives in:[address]70} Stairs:  {opstairs:27293} Has following equipment at home: {Assistive devices:23999}  OCCUPATION: ***  PLOF: {PLOF:24004}  PATIENT GOALS: ***  NEXT MD VISIT: ***  OBJECTIVE:  Note: Objective measures were completed at Evaluation unless otherwise noted.  DIAGNOSTIC FINDINGS: ***  PATIENT SURVEYS:  PSFS: THE PATIENT SPECIFIC FUNCTIONAL SCALE  Place score of 0-10 (0 = unable to perform activity and 10 = able to perform activity at the same level as before injury or problem)  Activity Date: 04/25/2024         2.     3.     4.      Total Score ***      Total Score = Sum of activity scores/number of activities  Minimally Detectable Change: 3 points (for single activity); 2 points (for average score)  [facility] (nd). The Patient Specific Functional Scale . Retrieved from [email or web address]   COGNITION: Overall cognitive status: {cognition:24006}     SENSATION: {sensation:27233}  EDEMA:  {edema:24020}  MUSCLE LENGTH: Hamstrings: Right *** deg; Left *** deg Debby test: Right *** deg; Left *** deg  POSTURE: {posture:25561}  PALPATION: ***  LOWER EXTREMITY ROM:  {AROM/PROM:27142} ROM Right Eval 04/25/2024 Left Eval 04/25/2024  Hip flexion    Hip extension    Hip abduction    Hip adduction    Hip internal rotation    Hip external rotation    Knee flexion    Knee extension    Ankle dorsiflexion    Ankle plantarflexion    Ankle inversion    Ankle eversion     (Blank rows = not tested)  LOWER EXTREMITY MMT:  MMT Right Eval 04/25/2024 Left Eval 04/25/2024  Hip flexion    Hip extension    Hip abduction    Hip adduction    Hip internal rotation    Hip external rotation    Knee flexion    Knee extension    Ankle dorsiflexion    Ankle plantarflexion    Ankle inversion    Ankle eversion     (Blank rows = not tested)  LOWER EXTREMITY SPECIAL TESTS:  {LEspecialtests:26242}  FUNCTIONAL  TESTS:  {Functional tests:24029}  GAIT: Distance walked: *** Assistive device utilized: {Assistive devices:23999} Level of assistance: {Levels of assistance:24026} Comments: ***                                                                                                                                TREATMENT DATE:  04/25/2024 TherEx: HEP handout provided with patient performing one set of each activity for appropriate form. Verbal and tactile cues provided.   Self-Care: POC, ***    PATIENT EDUCATION:  Education details: *** Person educated: {Person educated:25204}  Education method: {Education Method:25205} Education comprehension: {Education Comprehension:25206}  HOME EXERCISE PROGRAM: ***  ASSESSMENT:  CLINICAL IMPRESSION: Patient is a 53 y.o. F who was seen today for physical therapy evaluation and treatment for chronic Rt knee pain presenting with ***. Patient will benefit from skilled PT to address above noted deficits.   OBJECTIVE IMPAIRMENTS: {opptimpairments:25111}.   ACTIVITY LIMITATIONS: {activitylimitations:27494}  PARTICIPATION LIMITATIONS: {participationrestrictions:25113}  PERSONAL FACTORS: {Personal factors:25162} are also affecting patient's functional outcome.   REHAB POTENTIAL: {rehabpotential:25112}  CLINICAL DECISION MAKING: {clinical decision making:25114}  EVALUATION COMPLEXITY: {Evaluation complexity:25115}   GOALS: Goals reviewed with patient? Yes  SHORT TERM GOALS: Target date: *** Patient will show compliance with initial HEP. Baseline: Goal status: INITIAL  2.  Patient will report pain levels no greater than ***/10 in order to show an improved overall quality of life. Baseline:  Goal status: INITIAL    LONG TERM GOALS: Target date: ***  Patient will be independent with final HEP in order to maintain and progress upon functional gains made within PT. Baseline:  Goal status: INITIAL   2.  Patient will report pain levels  no greater than ***/10 in order to show an improved overall quality of life. Baseline:  Goal status: INITIAL  3.  Patient will increase PSFS to at least *** in order to show a significantly improved subjective disability rating. Baseline:  Goal status: INITIAL  4.  *** Baseline:  Goal status: INITIAL  5.  *** Baseline:  Goal status: INITIAL  6.  *** Baseline:  Goal status: INITIAL   PLAN:  PT FREQUENCY: {rehab frequency:25116}  PT DURATION: {rehab duration:25117}  PLANNED INTERVENTIONS: 97164- PT Re-evaluation, 97750- Physical Performance Testing, 97110-Therapeutic exercises, 97530- Therapeutic activity, [id number]- Neuromuscular re-education, 97535- Self Care, 02859- Manual therapy, [id number]- Gait training, [id number]- Orthotic Initial, [id number]- Orthotic/Prosthetic subsequent, [id number]- Canalith repositioning, [id number]- Aquatic Therapy, H9716- Electrical stimulation (unattended), [phone]- Electrical stimulation (manual), [id number]- Vasopneumatic device, [id number]- Ultrasound, [id number]- Traction (mechanical), [id number]- Ionotophoresis 4mg /ml Dexamethasone , 79439 (1-2 muscles), 20561 (3+ muscles)- Dry Needling, Patient/Family education, Balance training, Stair training, Taping, Joint mobilization, Joint manipulation, Spinal manipulation, Spinal mobilization, Vestibular training, DME instructions, Cryotherapy, and Moist heat  PLAN FOR NEXT SESSION: review HEP, ***   Susannah Daring, PT, DPT 04/24/24 8:20 AM

## 2024-04-25 ENCOUNTER — Ambulatory Visit

## 2024-04-25 NOTE — Therapy (Signed)
 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 Lynwood Nasuti   Hypertension    Hyperthyroidism    in the past   Hypothyroidism    Iron deficiency anemia    Palpitation    Palpitations 10/15/2023   Personal history of chemotherapy    Personal history of radiation therapy    Spontaneous ecchymoses    Tachycardia    Vitamin D  deficiency    Past Surgical History:  Procedure Laterality Date   BREAST BIOPSY     BREAST EXCISIONAL BIOPSY     BREAST LUMPECTOMY Right 08/20/2020   w/ rad w/ chemo   BREAST LUMPECTOMY WITH RADIOACTIVE SEED LOCALIZATION Right 08/20/2020   Procedure: RADIOACTIVE SEED GUIDED RIGHT BREAST LUMPECTOMY;  Surgeon: Vernetta Berg, MD;  Location: [facility];  Service: General;  Laterality: Right;   CHOLECYSTECTOMY     COLONOSCOPY  05/18/2018   KNEE SURGERY     PORT-A-CATH REMOVAL N/A 04/30/2021   Procedure: REMOVAL PORT-A-CATH;  Surgeon: Vernetta Berg, MD;  Location: [facility];  Service: General;  Laterality: N/A;   PORTACATH PLACEMENT Left 04/15/2020   Procedure: INSERTION PORT-A-CATH;  Surgeon: Vernetta Berg, MD;  Location: [facility];  Service: General;  Laterality: Left;   RADIOACTIVE SEED GUIDED AXILLARY SENTINEL LYMPH NODE Right 08/20/2020   Procedure: RADIOACTIVE SEED TARGETED RIGHT AXILLARY LYMPH NODE DISSECTION;  Surgeon: Vernetta Berg, MD;  Location: [facility];  Service: General;  Laterality: Right;   TONSILLECTOMY     Patient Active Problem List   Diagnosis Date Noted   Counseling about travel 02/08/2024   Acute  swimmer's ear of left side 02/08/2024   Class 1 obesity due to excess calories with serious comorbidity and body mass index (BMI) of 30.0 to 30.9 in adult 02/07/2024   Chronic pain of right knee 02/07/2024   Palpitations 10/15/2023   Encounter for general adult medical examination w/o abnormal findings 08/03/2023   Other abnormal glucose 01/20/2023   Trigger middle finger of left hand 01/20/2023   Constipation 07/15/2021   Chronic idiopathic constipation 07/15/2021   Dysphagia 07/15/2021   Gastroesophageal reflux disease 07/15/2021   Uterine leiomyoma 04/19/2020   Genetic testing 04/19/2020   Family history of ovarian cancer 04/03/2020   Family history of leukemia 04/03/2020   Family history of breast cancer 04/03/2020   Malignant neoplasm of upper-outer quadrant of right breast in female, estrogen receptor negative (HCC) 03/28/2020   Overweight (BMI 25.0-29.9) 12/05/2019   Essential hypertension, benign 05/29/2018   Iritis of left eye 05/29/2018   Iron deficiency anemia due to chronic blood loss 05/29/2018   Primary hypothyroidism 05/29/2018    PCP: Catheryn Slocumb, MD  REFERRING PROVIDER: Lonell Sprang, DO  REFERRING DIAG: [phone] (ICD-10-CM) - Chronic pain of right knee M17.11 (ICD-10-CM) - Unilateral primary osteoarthritis, right knee  THERAPY DIAG:  No diagnosis found.  Rationale for Evaluation and Treatment: Rehabilitation  ONSET DATE: ***  SUBJECTIVE:   SUBJECTIVE STATEMENT: ***  PERTINENT HISTORY: *** PAIN:  Are you having pain? Yes: NPRS scale: *** Pain location: *** Pain description: *** Aggravating factors: *** Relieving factors: ***  PRECAUTIONS: {Therapy precautions:24002}  RED FLAGS: {PT Red Flags:29287}   WEIGHT BEARING RESTRICTIONS: {Yes ***/No:24003}  FALLS:  Has patient fallen in last 6 months? {fallsyesno:27318}  LIVING ENVIRONMENT: Lives with: {[facility] lives with:25569::lives with their family} Lives in: {Lives in:[address]} Stairs:  {opstairs:27293} Has following equipment at home: {Assistive devices:23999}  OCCUPATION: ***  PLOF: {PLOF:24004}  PATIENT GOALS: ***  NEXT MD VISIT: ***  OBJECTIVE:  Note: Objective measures were completed at Evaluation unless otherwise noted.  DIAGNOSTIC FINDINGS: none listed  PATIENT SURVEYS:  PSFS: THE PATIENT SPECIFIC FUNCTIONAL SCALE  Place score of 0-10 (0 = unable to perform activity and 10 = able to perform activity at the same level as before injury or problem)  Activity Date: 04/25/2024         2.     3.     4.      Total Score ***      Total Score = Sum of activity scores/number of activities  Minimally Detectable Change: 3 points (for single activity); 2 points (for average score)  [facility] (nd). The Patient Specific Functional Scale . Retrieved from [email or web address]   COGNITION: Overall cognitive status: {cognition:24006}     SENSATION: {sensation:27233}  EDEMA:  {edema:24020}  MUSCLE LENGTH: Hamstrings: Right *** deg; Left *** deg Debby test: Right *** deg; Left *** deg  POSTURE: {posture:25561}  PALPATION: ***  LOWER EXTREMITY ROM:  {AROM/PROM:27142} ROM Right Eval 04/25/2024 Left Eval 04/25/2024  Hip flexion    Hip extension    Hip abduction    Hip adduction    Hip internal rotation    Hip external rotation    Knee flexion    Knee extension    Ankle dorsiflexion    Ankle plantarflexion    Ankle inversion    Ankle eversion     (Blank rows = not tested)  LOWER EXTREMITY MMT:  MMT Right Eval 04/25/2024 Left Eval 04/25/2024  Hip flexion    Hip extension    Hip abduction    Hip adduction    Hip internal rotation    Hip external rotation    Knee flexion    Knee extension    Ankle dorsiflexion    Ankle plantarflexion    Ankle inversion    Ankle eversion     (Blank rows = not tested)  LOWER EXTREMITY SPECIAL TESTS:   {LEspecialtests:26242}  FUNCTIONAL TESTS:  5 times sit to stand: ***  GAIT: Distance walked: *** Assistive device utilized: {Assistive devices:23999} Level of assistance: {Levels of assistance:24026} Comments: ***                                                                                                                                TREATMENT DATE:  04/25/2024 TherEx: HEP handout provided with patient performing one set of each activity for appropriate form. Verbal and tactile cues provided.   Self-Care: POC, ***    PATIENT EDUCATION:  Education details:  HEP Person educated: Patient Education method: Explanation, Demonstration, Tactile cues, Verbal cues, and Handouts Education comprehension: verbalized understanding, returned demonstration, and verbal cues required  HOME EXERCISE PROGRAM: ***  ASSESSMENT:  CLINICAL IMPRESSION: Patient is a 53 y.o. F who was seen today for physical therapy evaluation and treatment for chronic Rt knee pain presenting with ***. Patient will benefit from skilled PT to address above noted deficits.   OBJECTIVE IMPAIRMENTS: {opptimpairments:25111}.   ACTIVITY LIMITATIONS: {activitylimitations:27494}  PARTICIPATION LIMITATIONS: {participationrestrictions:25113}  PERSONAL FACTORS: {Personal factors:25162} are also affecting patient's functional outcome.   REHAB POTENTIAL: {rehabpotential:25112}  CLINICAL DECISION MAKING: {clinical decision making:25114}  EVALUATION COMPLEXITY: {Evaluation complexity:25115}   GOALS: Goals reviewed with patient? Yes  SHORT TERM GOALS: Target date: *** Patient will show compliance with initial HEP. Baseline: Goal status: INITIAL  2.  Patient will report pain level to decrease by 1 level.  Baseline:  Goal status: INITIAL    LONG TERM GOALS: Target date: ***  Patient will be independent with final HEP in order to maintain and progress upon functional gains made within PT. Baseline:  Goal  status: INITIAL   2.  Patient will report pain levels no greater than 2/10 in order to show an improved overall quality of life. Baseline:  Goal status: INITIAL  3.  Patient will increase PSFS to at least 3 points in order to show a significantly improved subjective disability rating. Baseline:  Goal status: INITIAL  4.  Increase AROM by*** Baseline:  Goal status: INITIAL  5.  Increase strength of R LE by 1/2 grade to 1 full grade in R LE.   Baseline:  Goal status: INITIAL  6.  *** Baseline:  Goal status: INITIAL   PLAN:  PT FREQUENCY: {rehab frequency:25116}  PT DURATION: {rehab duration:25117}  PLANNED INTERVENTIONS: 97164- PT Re-evaluation, 97750- Physical Performance Testing, 97110-Therapeutic exercises, 97530- Therapeutic activity, [id number]- Neuromuscular re-education, 97535- Self Care, 02859- Manual therapy, [id number]- Gait training, [phone]- Orthotic Initial, [id number]- Orthotic/Prosthetic subsequent, [phone]- Canalith repositioning, [id number]- Aquatic Therapy, H9716- Electrical stimulation (unattended), [phone]- Electrical stimulation (manual), [id number]- Vasopneumatic device, [id number]- Ultrasound, [id number]- Traction (mechanical), [id number]- Ionotophoresis 4mg /ml Dexamethasone , 79439 (1-2 muscles), 20561 (3+ muscles)- Dry Needling, Patient/Family education, Balance training, Stair training, Taping, Joint mobilization, Joint manipulation, Spinal manipulation, Spinal mobilization, Vestibular training, DME instructions, Cryotherapy, and Moist heat  PLAN FOR NEXT SESSION:   Burnard Meth, PT 04/25/24  3:46 PM

## 2024-04-26 ENCOUNTER — Other Ambulatory Visit: Payer: Self-pay

## 2024-04-26 ENCOUNTER — Ambulatory Visit

## 2024-04-26 DIAGNOSIS — M25561 Pain in right knee: Secondary | ICD-10-CM

## 2024-04-26 DIAGNOSIS — M25661 Stiffness of right knee, not elsewhere classified: Secondary | ICD-10-CM

## 2024-04-26 DIAGNOSIS — G8929 Other chronic pain: Secondary | ICD-10-CM

## 2024-04-28 ENCOUNTER — Ambulatory Visit: Admitting: Rehabilitative and Restorative Service Providers"

## 2024-04-28 ENCOUNTER — Encounter: Payer: Self-pay | Admitting: Rehabilitative and Restorative Service Providers"

## 2024-04-28 DIAGNOSIS — G8929 Other chronic pain: Secondary | ICD-10-CM

## 2024-04-28 DIAGNOSIS — M25661 Stiffness of right knee, not elsewhere classified: Secondary | ICD-10-CM | POA: Diagnosis not present

## 2024-04-28 DIAGNOSIS — M25561 Pain in right knee: Secondary | ICD-10-CM | POA: Diagnosis not present

## 2024-04-28 NOTE — Therapy (Signed)
 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 Lynwood Nasuti   Hypertension    Hyperthyroidism    in the past   Hypothyroidism    Iron deficiency anemia    Palpitation    Palpitations 10/15/2023   Personal history of chemotherapy    Personal history of radiation therapy    Spontaneous ecchymoses    Tachycardia    Vitamin D  deficiency    Past Surgical History:  Procedure Laterality Date   BREAST BIOPSY     BREAST EXCISIONAL BIOPSY     BREAST LUMPECTOMY Right 08/20/2020   w/ rad w/ chemo   BREAST LUMPECTOMY WITH RADIOACTIVE SEED LOCALIZATION Right 08/20/2020   Procedure: RADIOACTIVE SEED GUIDED RIGHT BREAST LUMPECTOMY;  Surgeon: Vernetta Berg, MD;  Location: [facility] OR;  Service: General;  Laterality: Right;   CHOLECYSTECTOMY     COLONOSCOPY  05/18/2018   KNEE SURGERY     PORT-A-CATH REMOVAL N/A 04/30/2021   Procedure: REMOVAL PORT-A-CATH;  Surgeon: Vernetta Berg, MD;  Location: [facility];  Service: General;  Laterality: N/A;   PORTACATH PLACEMENT Left 04/15/2020   Procedure: INSERTION PORT-A-CATH;  Surgeon: Vernetta Berg, MD;  Location: [facility];  Service: General;  Laterality: Left;   RADIOACTIVE SEED GUIDED AXILLARY  SENTINEL LYMPH NODE Right 08/20/2020   Procedure: RADIOACTIVE SEED TARGETED RIGHT AXILLARY LYMPH NODE DISSECTION;  Surgeon: Vernetta Berg, MD;  Location: [facility] OR;  Service: General;  Laterality: Right;   TONSILLECTOMY     Patient Active Problem List   Diagnosis Date Noted   Counseling about travel 02/08/2024   Acute swimmer's ear of left side 02/08/2024   Class 1 obesity due to excess calories with serious comorbidity and body mass index (BMI) of 30.0 to 30.9 in adult 02/07/2024   Chronic pain of right knee 02/07/2024   Palpitations 10/15/2023   Encounter for general adult medical examination w/o abnormal findings 08/03/2023   Other abnormal glucose 01/20/2023   Trigger middle finger of left hand 01/20/2023   Constipation 07/15/2021   Chronic idiopathic constipation 07/15/2021   Dysphagia 07/15/2021   Gastroesophageal reflux disease 07/15/2021   Uterine leiomyoma 04/19/2020   Genetic testing 04/19/2020   Family history of ovarian cancer 04/03/2020   Family history of leukemia 04/03/2020   Family history of breast cancer 04/03/2020   Malignant neoplasm of upper-outer quadrant of right breast in female, estrogen receptor negative (HCC) 03/28/2020   Overweight (BMI 25.0-29.9) 12/05/2019   Essential hypertension, benign 05/29/2018   Iritis of left eye 05/29/2018   Iron deficiency anemia due to chronic blood loss 05/29/2018   Primary hypothyroidism 05/29/2018  PCP: Catheryn Slocumb, MD  REFERRING PROVIDER: Lonell Sprang, DO  REFERRING DIAG: [phone] (ICD-10-CM) - Chronic pain of right knee M17.11 (ICD-10-CM) - Unilateral primary osteoarthritis, right knee  THERAPY DIAG:  Chronic pain of right knee  Stiffness of right knee, not elsewhere classified  Rationale for Evaluation and Treatment: Rehabilitation  ONSET DATE: chronic  SUBJECTIVE:   SUBJECTIVE STATEMENT: Lee-Ann is very physically active.  She is motivated to delay or avoid any additional knee surgeries.  Pt  reports 4 previous Rt knee surgeries. Despite history she is very active at the gym.   In the spring hurt the knee a Stipes in a mud run.  Currently c/o pain at the medial, anterior and lateral knee.    PERTINENT HISTORY: + R breast CA Last knee surgery 15+ years ago PAIN:  Are you having pain? Yes: NPRS scale: Averages 3/10 Pain location: ant, lateral and medial right knee Pain description: pinching(lower) pulling (upper) Aggravating factors: stairs, pivoting, squatting Relieving factors: rest  PRECAUTIONS: None  RED FLAGS: None   WEIGHT BEARING RESTRICTIONS: No  FALLS:  Has patient fallen in last 6 months? No and Yes. Number of falls 1in a run  LIVING ENVIRONMENT: Lives with: lives with their spouse Lives in: House/apartment Stairs: Yes: Internal: 12 steps; on right going up Has following equipment at home: None  OCCUPATION: negotiator  PLOF: Independent  PATIENT GOALS: Softball. Pickleball, sew, paint, spinning class  NEXT MD VISIT: 06/14/24  OBJECTIVE:  Note: Objective measures were completed at Evaluation unless otherwise noted.  DIAGNOSTIC FINDINGS: none listed  PATIENT SURVEYS:  PSFS: THE PATIENT SPECIFIC FUNCTIONAL SCALE  Place score of 0-10 (0 = unable to perform activity and 10 = able to perform activity at the same level as before injury or problem)  Activity Date: 04/25/2024    Descending stairs 7    2.squatting 7    3.in/out car 7    4.      Total Score 7      Total Score = Sum of activity scores/number of activities  Minimally Detectable Change: 3 points (for single activity); 2 points (for average score)  [facility] (nd). The Patient Specific Functional Scale . Retrieved from [email or web address]   COGNITION: Overall cognitive status: Within functional limits for tasks assessed     SENSATION: WFL  EDEMA:  Not measured  MUSCLE LENGTH: Hamstrings: Right mild restriction    POSTURE: L calcaneus valgus, mild L  genu varus, R knee flexed in standing,   PALPATION: 1+ at lateral jt line  LOWER EXTREMITY ROM:  Active/Passive ROM Right Eval 04/26/2024 Left Eval 04/25/2024  Hip flexion 95 A   Hip extension    Hip abduction 40 A   Hip adduction    Hip internal rotation    Hip external rotation    Knee flexion 110A/115   Knee extension +8 to 0   Ankle dorsiflexion    Ankle plantarflexion    Ankle inversion    Ankle eversion     (Blank rows = not tested)  LOWER EXTREMITY MMT:  MMT Right Eval 04/26/2024 Left Eval 04/25/2024  Hip flexion 5   Hip extension    Hip abduction 5-   Hip adduction 5-   Hip internal rotation    Hip external rotation    Knee flexion 5-   Knee extension 4+   Ankle dorsiflexion    Ankle plantarflexion    Ankle inversion    Ankle eversion     (Blank rows =  not tested)  LOWER EXTREMITY SPECIAL TESTS:  Knee special tests: Anterior drawer test: negative, McMurray's test: negative, and Bounce neg  FUNCTIONAL TESTS:  5 times sit to stand: 10 sec  GAIT: Distance walked: Medical Sales Representative device utilized: None Level of assistance: Complete Independence Comments: ---                                                                                                                        TREATMENT DATE:  04/28/2024 Supine quadriceps stretch 4 x 20 seconds Prone quadriceps stretch 2 x 20 seconds Seated straight leg raises 2 sets of 10 with 1# (2# next visit) Seated knee extension machine 90-40 degrees up bilateral, down unilateral (right) and slow eccentrics 25# 12 reps and 35# 12 reps  Neuromuscular re-education: Tandem balance 4 x 20 seconds each eyes open; head turning; eyes closed Single-leg balance 10 x 10 seconds   04/25/2024 TherEx: HEP handout provided with patient performing one set of each activity for appropriate form. Verbal and tactile cues provided.   Self-Care: POC, adjustments with current gym  program    PATIENT EDUCATION:  Education details: HEP Person educated: Patient Education method: Explanation, Demonstration, Tactile cues, Verbal cues, and Handouts Education comprehension: verbalized understanding, returned demonstration, and verbal cues required  HOME EXERCISE PROGRAM: Access Code: [email or web address] URL: [email or web address] Date: 04/28/2024 Prepared by: Lamar Ivory  Exercises - Supine Quadriceps Stretch with Strap on Table  - 2 x daily - 7 x weekly - 1 sets - 4-5 reps - 10-15 seconds (longer is ok) hold - Seated Straight Leg Raise   - 1 x daily - 5-7 x weekly - 5 sets - 10 reps - 2 seconds hold - Tandem Stance  - 1 x daily - 3-7 x weekly - 1 sets - 10 reps - 20 second hold - Single Leg Stance  - 1 x daily - 3-7 x weekly - 1 sets - 10 reps - 10 seconds hold  ASSESSMENT:  CLINICAL IMPRESSION: Anneka did a great job with her day 1 program and strength progressions today.  She is aware of the importance of good quadriceps strength long-term to preserve her joint and return to her preferred activity level.  Continue quadriceps strength emphasis to meet long-term goals.  Patient is a 53 y.o. F who was seen today for physical therapy evaluation and treatment for chronic Rt knee pain presenting with decreased ROM, decreased strength, decreased flexibility and pain.  Patient will benefit from skilled PT to address above noted deficits.   OBJECTIVE IMPAIRMENTS: decreased ROM, decreased strength, impaired flexibility, and pain.   ACTIVITY LIMITATIONS: sporting  PARTICIPATION LIMITATIONS: gym activities sometimes affected  PERSONAL FACTORS: 1-2 comorbidities: CA, htn are also affecting patient's functional outcome.   REHAB POTENTIAL: Good  CLINICAL DECISION MAKING: Stable/uncomplicated  EVALUATION COMPLEXITY: Low   GOALS: Goals reviewed with patient? Yes  SHORT TERM GOALS: Target date: 05/18/2024   Patient will show compliance with initial  HEP. Baseline: Goal status: Met 04/28/2024  2.  Patient will report pain level to decrease by 1 level.  Baseline:  Goal status: INITIAL    LONG TERM GOALS: Target date: 06/22/2024  8 weeks    Patient will be independent with final HEP in order to maintain and progress upon functional gains made within PT. Baseline:  Goal status: INITIAL   2.  Patient will report pain levels no greater than 2/10 in order to show an improved overall quality of life. Baseline:  Goal status: INITIAL  3.  Patient will increase PSFS to at least 3 points in order to show a significantly improved subjective disability rating. Baseline:  Goal status: INITIAL  4.  Increase Active extension by 4 or more degrees and flexion by 5 degrees. Baseline:  Goal status: INITIAL  5.  Increase strength of R LE by 1/2 grade to 1 full grade in R LE.   Baseline:  Goal status: INITIAL  6.  - Baseline:  Goal status: INITIAL   PLAN:  PT FREQUENCY: 1-2x/week  PT DURATION: 8 weeks  PLANNED INTERVENTIONS: 97164- PT Re-evaluation, 97750- Physical Performance Testing, 97110-Therapeutic exercises, 97530- Therapeutic activity, [id number]- Neuromuscular re-education, 97535- Self Care, 02859- Manual therapy, [id number]- Gait training, [phone]- Orthotic Initial, [phone]- Orthotic/Prosthetic subsequent, [phone]- Canalith repositioning, [id number]- Aquatic Therapy, H9716- Electrical stimulation (unattended), [phone]- Electrical stimulation (manual), [id number]- Vasopneumatic device, [id number]- Ultrasound, [id number]- Traction (mechanical), [id number]- Ionotophoresis 4mg /ml Dexamethasone , 79439 (1-2 muscles), [id number] (3+ muscles)- Dry Needling, Patient/Family education, Balance training, Stair training, Taping, Joint mobilization, Joint manipulation, Spinal manipulation, Spinal mobilization, Vestibular training, DME instructions, Cryotherapy, and Moist heat  PLAN FOR NEXT SESSION: Quad strengthening emphasis for a knee that has had 4 surgeries (2 ACL, 2 meniscus).  Myer LELON Ivory PT, MPT 04/28/2024  4:39 PM

## 2024-05-03 ENCOUNTER — Encounter: Payer: Self-pay | Admitting: Family Medicine

## 2024-05-03 ENCOUNTER — Ambulatory Visit: Admitting: Family Medicine

## 2024-05-03 VITALS — BP 116/64 | HR 91 | Temp 98.2°F | Ht 68.0 in | Wt 201.0 lb

## 2024-05-03 DIAGNOSIS — Z6830 Body mass index (BMI) 30.0-30.9, adult: Secondary | ICD-10-CM

## 2024-05-03 DIAGNOSIS — E6609 Other obesity due to excess calories: Secondary | ICD-10-CM

## 2024-05-03 DIAGNOSIS — R82998 Other abnormal findings in urine: Secondary | ICD-10-CM | POA: Diagnosis not present

## 2024-05-03 DIAGNOSIS — L239 Allergic contact dermatitis, unspecified cause: Secondary | ICD-10-CM | POA: Insufficient documentation

## 2024-05-03 DIAGNOSIS — R21 Rash and other nonspecific skin eruption: Secondary | ICD-10-CM | POA: Insufficient documentation

## 2024-05-03 DIAGNOSIS — R3129 Other microscopic hematuria: Secondary | ICD-10-CM | POA: Diagnosis not present

## 2024-05-03 DIAGNOSIS — E66811 Obesity, class 1: Secondary | ICD-10-CM

## 2024-05-03 LAB — POCT URINALYSIS DIP (CLINITEK)
Bilirubin, UA: NEGATIVE
Glucose, UA: NEGATIVE mg/dL
Ketones, POC UA: NEGATIVE mg/dL
Nitrite, UA: NEGATIVE
POC PROTEIN,UA: NEGATIVE
Spec Grav, UA: 1.01 (ref 1.010–1.025)
Urobilinogen, UA: 0.2 U/dL
pH, UA: 7 (ref 5.0–8.0)

## 2024-05-03 MED ORDER — TRIAMCINOLONE ACETONIDE 40 MG/ML IJ SUSP
60.0000 mg | Freq: Once | INTRAMUSCULAR | Status: AC
  Administered 2024-05-03: 16:00:00 60 mg via INTRAMUSCULAR

## 2024-05-03 MED ORDER — PREDNISONE 10 MG PO TABS: 10.0000 mg | ORAL_TABLET | Freq: Every day | ORAL | 0 refills | Status: AC

## 2024-05-03 NOTE — Assessment & Plan Note (Signed)
 Previous urinalysis showed trace blood, negative for infection.- Ordered repeat urinalysis.

## 2024-05-03 NOTE — Assessment & Plan Note (Signed)
 Acute rash on both legs with burning and itching, likely contact dermatitis due to distribution.  - Administered Kenalog  injection. - Prescribed prednisone  10 mg daily for 7 days.

## 2024-05-03 NOTE — Progress Notes (Signed)
 I,Jameka J Llittleton, CMA,acting as a neurosurgeon for [employer], NP.,have documented all relevant documentation on the behalf of Shannon Creighton, NP,as directed by  Shannon Creighton, NP while in the presence of Shannon Creighton, NP.  Subjective:  Patient ID: Shannon Kane , female    DOB: Dec 10, 1970 , 53 y.o.   MRN: [id number]  Chief Complaint  Patient presents with   Rash    Patient presents today for a rash on her lower legs and a spot on her arms and thigh. She reports the areas burn and itches. Patient reports the rash popped up on Tuesday. She reports she has used golds bond and witch hazel spray.     HPI Discussed the use of AI scribe software for clinical note transcription with the patient, who gave verbal consent to proceed.  History of Present Illness    Shannon Kane is a 53 year old female who presents with a skin outbreak on both legs.  The skin outbreak began on Tuesday, characterized by burning and stinging sensations. The rash is located on both legs, with bumps on various parts and a few spots on her arms. It presents as red and white patches, accompanied by itching and burning. She has applied Gold Bond lotion, which has helped to alleviate the itching. The rash is spreading. No new foods, clothing, or detergents have been introduced that could have caused the reaction.  She takes a vitamin D  with K2 supplement and Tums, which have been part of her routine for over two to three weeks. She has a history of breast cancer, for which she underwent a lumpectomy in 2021, followed by radiation therapy. She has been on anastrozole  since then, after switching from another medication due to joint pain.  She reports a previous episode of hematuria, which was evaluated with a urine culture that was negative for infection. This occurred after taking malaria medication during a trip abroad.      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 Lynwood Nasuti   Hypertension    Hyperthyroidism    in the past   Hypothyroidism    Iron deficiency anemia    Palpitation    Palpitations 10/15/2023   Personal history of chemotherapy    Personal history of radiation therapy    Spontaneous ecchymoses    Tachycardia    Vitamin D  deficiency      Family History  Problem Relation Age of Onset   Diabetes Mother    Hypertension Mother    Leukemia Father        dx > 50   Ovarian cancer Maternal Grandmother 44   Bone cancer Paternal Grandfather        dx unknown age   Cancer Maternal Uncle        Mouth and throad; dx > 50   Breast cancer Other        MGM's niece, dx unknown age   Cancer Maternal Uncle        unknown type; dx > 50    Current Medications[1]   Allergies[2]   Review of Systems  Constitutional: Negative.   HENT: Negative.    Respiratory: Negative.    Cardiovascular: Negative.   Genitourinary: Negative.   Skin:  Positive for  color change and rash.     Today's Vitals   05/03/24 1506  BP: 116/64  Pulse: 91  Temp: 98.2 F (36.8 C)  TempSrc: Oral  Weight: 201 lb (91.2 kg)  Height: 5' 8 (1.727 m)  PainSc: 0-No pain   Body mass index is 30.56 kg/m.  Wt Readings from Last 3 Encounters:  05/03/24 201 lb (91.2 kg)  03/27/24 197 lb 4 oz (89.5 kg)  03/03/24 193 lb 6.4 oz (87.7 kg)    The 10-year ASCVD risk score (Arnett DK, et al., 2019) is: 3.2%   Values used to calculate the score:     Age: 56 years     Clinically relevant sex: Female     Is Non-Hispanic African American: Yes     Diabetic: No     Tobacco smoker: No     Systolic Blood Pressure: 116 mmHg     Is BP treated: Yes     HDL Cholesterol: 52 mg/dL     Total Cholesterol: 173 mg/dL  Objective:  Physical Exam Constitutional:      Appearance: Normal appearance.  Cardiovascular:      Rate and Rhythm: Normal rate and regular rhythm.     Pulses: Normal pulses.     Heart sounds: Normal heart sounds.  Pulmonary:     Effort: Pulmonary effort is normal.     Breath sounds: Normal breath sounds.  Abdominal:     General: Bowel sounds are normal.  Skin:    Findings: Erythema and rash present.     Comments: Itchy,red bumps on bilateral lower extremities.  Neurological:     Mental Status: She is alert and oriented to person, place, and time. Mental status is at baseline.         Assessment And Plan:   Assessment & Plan Allergic contact dermatitis, unspecified trigger Acute rash on both legs with burning and itching, likely contact dermatitis due to distribution.  - Administered Kenalog  injection. - Prescribed prednisone  10 mg daily for 7 days. Hematuria, microscopic Previous urinalysis showed trace blood, negative for infection.- Ordered repeat urinalysis.  Leukocytes in urine Urine culture ordered Class 1 obesity due to excess calories with serious comorbidity and body mass index (BMI) of 30.0 to 30.9 in adult She is encouraged to strive for BMI less than 30 to decrease cardiac risk. Advised to aim for at least 150 minutes of exercise per week.        Return for keep schedule appt.  Patient was given opportunity to ask questions. Patient verbalized understanding of the plan and was able to repeat key elements of the plan. All questions were answered to their satisfaction.    I, Shannon Creighton, NP, have reviewed all documentation for this visit. The documentation on 05/03/2024 for the exam, diagnosis, procedures, and orders are all accurate and complete.   IF YOU HAVE BEEN REFERRED TO A SPECIALIST, IT MAY TAKE 1-2 WEEKS TO SCHEDULE/PROCESS THE REFERRAL. IF YOU HAVE NOT HEARD FROM US /SPECIALIST IN TWO WEEKS, PLEASE GIVE US  A CALL AT [phone] X [phone].      [1]  Current Outpatient Medications:    amLODipine  (NORVASC ) 2.5 MG tablet, TAKE 1 TABLET DAILY, Disp:  90 tablet, Rfl: 3   anastrozole  (ARIMIDEX ) 1 MG tablet, Take 1 tablet (1 mg total) by mouth every other day., Disp: , Rfl:    atovaquone -proguanil (MALARONE ) 250-100 MG TABS tablet, Take 1 tablet by mouth daily. Start 1-2 days prior to travel and continued 7 days after return, Disp:  20 tablet, Rfl: 0   Biotin 89999 MCG TBDP, 1,000 mcg., Disp: , Rfl:    cetirizine (ZYRTEC ALLERGY) 10 MG tablet, Take 1 tablet (10 mg total) by mouth daily as needed for allergies., Disp: 30 tablet, Rfl: 1   Cholecalciferol (VITAMIN D -3 PO), Take 5,000 Units by mouth daily., Disp: , Rfl:    MAGNESIUM GLYCINATE PO, Take by mouth., Disp: , Rfl:    predniSONE  (DELTASONE ) 10 MG tablet, Take 1 tablet (10 mg total) by mouth daily with breakfast., Disp: 7 tablet, Rfl: 0   sulfamethoxazole-trimethoprim (BACTRIM DS) 800-160 MG tablet, Take 1 tablet by mouth 2 (two) times daily., Disp: 10 tablet, Rfl: 0   SYNTHROID  75 MCG tablet, TAKE 1 TABLET DAILY BEFORE BREAKFAST, Disp: 90 tablet, Rfl: 3   triamcinolone  cream (KENALOG ) 0.1 %, APPLY TO AFFECTED AREA TWICE DAILY AS NEEDED, Disp: 45 g, Rfl: 0   vitamin B-12 (CYANOCOBALAMIN) 1000 MCG tablet, Take 1,000 mcg by mouth daily., Disp: , Rfl:  [2]  Allergies Allergen Reactions   Amoxicillin     Tolerates ANCEF    Ciprofloxacin Itching   Penicillins Hives    Tolerates ANCEF 

## 2024-05-03 NOTE — Assessment & Plan Note (Signed)
 Urine culture ordered

## 2024-05-03 NOTE — Assessment & Plan Note (Signed)
 She is encouraged to strive for BMI less than 30 to decrease cardiac risk. Advised to aim for at least 150 minutes of exercise per week.

## 2024-05-05 ENCOUNTER — Encounter: Payer: Self-pay | Admitting: Rehabilitative and Restorative Service Providers"

## 2024-05-05 ENCOUNTER — Ambulatory Visit: Admitting: Rehabilitative and Restorative Service Providers"

## 2024-05-05 DIAGNOSIS — G8929 Other chronic pain: Secondary | ICD-10-CM | POA: Diagnosis not present

## 2024-05-05 DIAGNOSIS — M25661 Stiffness of right knee, not elsewhere classified: Secondary | ICD-10-CM

## 2024-05-05 DIAGNOSIS — M25561 Pain in right knee: Secondary | ICD-10-CM | POA: Diagnosis not present

## 2024-05-05 LAB — URINE CULTURE

## 2024-05-05 NOTE — Therapy (Signed)
 " OUTPATIENT PHYSICAL THERAPY LOWER EXTREMITY TREATMENT   Patient Name: Shannon Kane MRN: [id number] DOB:08-24-1970, 53 y.o., female Today's Date: 05/05/2024  END OF SESSION:  PT End of Session - 05/05/24 0802     Visit Number 3    Number of Visits 17    Date for Recertification  06/22/24    PT Start Time 0802    PT Stop Time 0842    PT Time Calculation (min) 40 min    Activity Tolerance Patient tolerated treatment well;No increased pain    Behavior During Therapy [facility] for tasks assessed/performed            Past Medical History:  Diagnosis Date   Abnormal glucose    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 Lynwood Nasuti   Hypertension    Hyperthyroidism    in the past   Hypothyroidism    Iron deficiency anemia    Palpitation    Palpitations 10/15/2023   Personal history of chemotherapy    Personal history of radiation therapy    Spontaneous ecchymoses    Tachycardia    Vitamin D  deficiency    Past Surgical History:  Procedure Laterality Date   BREAST BIOPSY     BREAST EXCISIONAL BIOPSY     BREAST LUMPECTOMY Right 08/20/2020   w/ rad w/ chemo   BREAST LUMPECTOMY WITH RADIOACTIVE SEED LOCALIZATION Right 08/20/2020   Procedure: RADIOACTIVE SEED GUIDED RIGHT BREAST LUMPECTOMY;  Surgeon: Vernetta Berg, MD;  Location: [facility];  Service: General;  Laterality: Right;   CHOLECYSTECTOMY     COLONOSCOPY  05/18/2018   KNEE SURGERY     PORT-A-CATH REMOVAL N/A 04/30/2021   Procedure: REMOVAL PORT-A-CATH;  Surgeon: Vernetta Berg, MD;  Location: [facility];  Service: General;  Laterality: N/A;   PORTACATH PLACEMENT Left 04/15/2020   Procedure: INSERTION PORT-A-CATH;  Surgeon: Vernetta Berg, MD;  Location: [facility];  Service: General;  Laterality: Left;   RADIOACTIVE  SEED GUIDED AXILLARY SENTINEL LYMPH NODE Right 08/20/2020   Procedure: RADIOACTIVE SEED TARGETED RIGHT AXILLARY LYMPH NODE DISSECTION;  Surgeon: Vernetta Berg, MD;  Location: [facility];  Service: General;  Laterality: Right;   TONSILLECTOMY     Patient Active Problem List   Diagnosis Date Noted   Rash and nonspecific skin eruption 05/03/2024   Hematuria, microscopic 05/03/2024   Allergic contact dermatitis 05/03/2024   Leukocytes in urine 05/03/2024   Counseling about travel 02/08/2024   Acute swimmer's ear of left side 02/08/2024   Class 1 obesity due to excess calories with serious comorbidity and body mass index (BMI) of 30.0 to 30.9 in adult 02/07/2024   Chronic pain of right knee 02/07/2024   Palpitations 10/15/2023   Encounter for general adult medical examination w/o abnormal findings 08/03/2023   Other abnormal glucose 01/20/2023   Trigger middle finger of left hand 01/20/2023   Constipation 07/15/2021   Chronic idiopathic constipation 07/15/2021   Dysphagia 07/15/2021   Gastroesophageal reflux disease 07/15/2021   Uterine leiomyoma 04/19/2020   Genetic testing 04/19/2020   Family history of ovarian cancer 04/03/2020   Family history of leukemia 04/03/2020   Family history of breast cancer 04/03/2020   Malignant neoplasm of upper-outer quadrant of right breast in female, estrogen receptor negative (HCC) 03/28/2020   Overweight (BMI 25.0-29.9) 12/05/2019  Essential hypertension, benign 05/29/2018   Iritis of left eye 05/29/2018   Iron deficiency anemia due to chronic blood loss 05/29/2018   Primary hypothyroidism 05/29/2018    PCP: Catheryn Slocumb, MD  REFERRING PROVIDER: Lonell Sprang, DO  REFERRING DIAG: [phone] (ICD-10-CM) - Chronic pain of right knee M17.11 (ICD-10-CM) - Unilateral primary osteoarthritis, right knee  THERAPY DIAG:  Chronic pain of right knee  Stiffness of right knee, not elsewhere classified  Rationale for Evaluation and Treatment:  Rehabilitation  ONSET DATE: chronic  SUBJECTIVE:   SUBJECTIVE STATEMENT: Shannon Kane continues to have good HEP compliance.  She had 2 days without pain after her last PT visit.  Pt reports 4 previous Rt knee surgeries. Despite history she is very active at the gym.   In the spring hurt the knee a [place] in a mud run.  Currently c/o pain at the medial, anterior and lateral knee.    PERTINENT HISTORY: + R breast CA Last knee surgery 15+ years ago PAIN:  Are you having pain? Yes: NPRS scale: 0-3/10 this week Pain location: ant, lateral and medial right knee Pain description: pinching (lower) pulling (upper) Aggravating factors: descending stairs, pivoting, squatting Relieving factors: rest, exercises  PRECAUTIONS: None  RED FLAGS: None   WEIGHT BEARING RESTRICTIONS: No  FALLS:  Has patient fallen in last 6 months? No and Yes. Number of falls 1in a run  LIVING ENVIRONMENT: Lives with: lives with their spouse Lives in: House/apartment Stairs: Yes: Internal: 12 steps; on right going up Has following equipment at home: None  OCCUPATION: negotiator  PLOF: Independent  PATIENT GOALS: Softball. Pickleball, sew, paint, spinning class  NEXT MD VISIT: 06/14/24  OBJECTIVE:  Note: Objective measures were completed at Evaluation unless otherwise noted.  DIAGNOSTIC FINDINGS: none listed  PATIENT SURVEYS:  PSFS: THE PATIENT SPECIFIC FUNCTIONAL SCALE  Place score of 0-10 (0 = unable to perform activity and 10 = able to perform activity at the same level as before injury or problem)  Activity Date: 04/25/2024    Descending stairs 7    2.   squatting 7    3.   in/out car 7    4.      Total Score 7      Total Score = Sum of activity scores/number of activities  Minimally Detectable Change: 3 points (for single activity); 2 points (for average score)  [facility] (nd). The Patient Specific Functional Scale . Retrieved from  [email or web address]   COGNITION: Overall cognitive status: Within functional limits for tasks assessed     SENSATION: WFL  EDEMA:  Not measured  MUSCLE LENGTH: Hamstrings: Right mild restriction   POSTURE: L calcaneus valgus, mild L  genu varus, R knee flexed in standing,   PALPATION: 1+ at lateral jt line  LOWER EXTREMITY ROM:  Active/Passive ROM Right Eval 04/26/2024 Left Eval 04/25/2024  Hip flexion 95 A   Hip extension    Hip abduction 40 A   Hip adduction    Hip internal rotation    Hip external rotation    Knee flexion 110A/115   Knee extension +8 to 0   Ankle dorsiflexion    Ankle plantarflexion    Ankle inversion    Ankle eversion     (Blank rows = not tested)  LOWER EXTREMITY MMT:  MMT Right Eval 04/26/2024 Left Eval 04/25/2024  Hip flexion 5   Hip extension    Hip abduction 5-   Hip adduction 5-   Hip internal rotation  Hip external rotation    Knee flexion 5-   Knee extension 4+   Ankle dorsiflexion    Ankle plantarflexion    Ankle inversion    Ankle eversion     (Blank rows = not tested)  LOWER EXTREMITY SPECIAL TESTS:  Knee special tests: Anterior drawer test: negative, McMurray's test: negative, and Bounce neg  FUNCTIONAL TESTS:  5 times sit to stand: 10 sec  GAIT: Distance walked: Medical Sales Representative device utilized: None Level of assistance: Complete Independence Comments: ---                                                                                                                        TREATMENT DATE:  05/05/2024 Recumbent bike Seat 7 5 minutes Level 6-8 :50 12 + MPH and sprint the last :10 (max 28.5 MPH) Prone quadriceps stretch 4 x 20 seconds Seated straight leg raises 2 sets of 10 with 2.5# Seated knee extension machine 90-40 degrees up bilateral, down unilateral (right) and slow eccentrics 35# 12 reps  Neuromuscular re-education: Tandem balance 4 x 20  seconds each eyes open head turning; eyes closed Single-leg balance eyes open; head turning and eyes closed 4 x 10 seconds each   04/28/2024 Supine quadriceps stretch 4 x 20 seconds Prone quadriceps stretch 2 x 20 seconds Seated straight leg raises 2 sets of 10 with 1# (2# next visit) Seated knee extension machine 90-40 degrees up bilateral, down unilateral (right) and slow eccentrics 25# 12 reps and 35# 12 reps  Neuromuscular re-education: Tandem balance 4 x 20 seconds each eyes open; head turning; eyes closed Single-leg balance 10 x 10 seconds   04/25/2024 TherEx: HEP handout provided with patient performing one set of each activity for appropriate form. Verbal and tactile cues provided.   Self-Care: POC, adjustments with current gym program    PATIENT EDUCATION:  Education details: HEP Person educated: Patient Education method: Explanation, Demonstration, Tactile cues, Verbal cues, and Handouts Education comprehension: verbalized understanding, returned demonstration, and verbal cues required  HOME EXERCISE PROGRAM: Access Code: [email or web address] URL: [email or web address] Date: 04/28/2024 Prepared by: Lamar Ivory  Exercises - Supine Quadriceps Stretch with Strap on Table  - 2 x daily - 7 x weekly - 1 sets - 4-5 reps - 10-15 seconds (longer is ok) hold - Seated Straight Leg Raise   - 1 x daily - 5-7 x weekly - 5 sets - 10 reps - 2 seconds hold - Tandem Stance  - 1 x daily - 3-7 x weekly - 1 sets - 10 reps - 20 second hold - Single Leg Stance  - 1 x daily - 3-7 x weekly - 1 sets - 10 reps - 10 seconds hold  ASSESSMENT:  CLINICAL IMPRESSION: Shannon Kane continues to do a great job with her home exercise program.  We made appropriate strength progressions today and she is able to incorporate some of these into her gym program.  We reviewed the importance of good quadriceps strength long-term to preserve her  joint and return to her preferred activity level.  Continue  quadriceps strength emphasis to meet long-term goals.  Patient is a 53 y.o. F who was seen today for physical therapy evaluation and treatment for chronic Rt knee pain presenting with decreased ROM, decreased strength, decreased flexibility and pain.  Patient will benefit from skilled PT to address above noted deficits.   OBJECTIVE IMPAIRMENTS: decreased ROM, decreased strength, impaired flexibility, and pain.   ACTIVITY LIMITATIONS: sporting  PARTICIPATION LIMITATIONS: gym activities sometimes affected  PERSONAL FACTORS: 1-2 comorbidities: CA, htn are also affecting patient's functional outcome.   REHAB POTENTIAL: Good  CLINICAL DECISION MAKING: Stable/uncomplicated  EVALUATION COMPLEXITY: Low   GOALS: Goals reviewed with patient? Yes  SHORT TERM GOALS: Target date: 05/18/2024   Patient will show compliance with initial HEP. Baseline: Goal status: Met 04/28/2024  2.  Patient will report pain level to decrease by 1 level.  Baseline:  Goal status: INITIAL    LONG TERM GOALS: Target date: 06/22/2024  8 weeks    Patient will be independent with final HEP in order to maintain and progress upon functional gains made within PT. Baseline:  Goal status: INITIAL   2.  Patient will report pain levels no greater than 2/10 in order to show an improved overall quality of life. Baseline:  Goal status: INITIAL  3.  Patient will increase PSFS to at least 3 points in order to show a significantly improved subjective disability rating. Baseline:  Goal status: INITIAL  4.  Increase Active extension by 4 or more degrees and flexion by 5 degrees. Baseline:  Goal status: INITIAL  5.  Increase strength of R LE by 1/2 grade to 1 full grade in R LE.   Baseline:  Goal status: INITIAL  6.  - Baseline:  Goal status: INITIAL   PLAN:  PT FREQUENCY: 1-2x/week  PT DURATION: 8 weeks  PLANNED INTERVENTIONS: [id number]- PT Re-evaluation, 97750- Physical Performance Testing, 97110-Therapeutic  exercises, 97530- Therapeutic activity, [id number]- Neuromuscular re-education, 97535- Self Care, [id number]- Manual therapy, [id number]- Gait training, [phone]- Orthotic Initial, [phone]- Orthotic/Prosthetic subsequent, [phone]- Canalith repositioning, [id number]- Aquatic Therapy, H9716- Electrical stimulation (unattended), [phone]- Electrical stimulation (manual), [id number]- Vasopneumatic device, [id number]- Ultrasound, [id number]- Traction (mechanical), [id number]- Ionotophoresis 4mg /ml Dexamethasone , [id number] (1-2 muscles), [id number] (3+ muscles)- Dry Needling, Patient/Family education, Balance training, Stair training, Taping, Joint mobilization, Joint manipulation, Spinal manipulation, Spinal mobilization, Vestibular training, DME instructions, Cryotherapy, and Moist heat  PLAN FOR NEXT SESSION: Quad strengthening emphasis for a knee that has had 4 surgeries (2 ACL, 2 meniscus).  Objective measures and patient-specific functional score update next visit.  Myer LELON Ivory PT, MPT 05/05/2024  5:03 PM   "

## 2024-05-09 ENCOUNTER — Encounter: Payer: Self-pay | Admitting: Physical Therapy

## 2024-05-09 ENCOUNTER — Ambulatory Visit: Admitting: Physical Therapy

## 2024-05-09 DIAGNOSIS — M25661 Stiffness of right knee, not elsewhere classified: Secondary | ICD-10-CM

## 2024-05-09 DIAGNOSIS — M25561 Pain in right knee: Secondary | ICD-10-CM | POA: Diagnosis not present

## 2024-05-09 DIAGNOSIS — G8929 Other chronic pain: Secondary | ICD-10-CM | POA: Diagnosis not present

## 2024-05-09 NOTE — Therapy (Signed)
 " OUTPATIENT PHYSICAL THERAPY LOWER EXTREMITY TREATMENT   Patient Name: Shannon Kane MRN: [id number] DOB:[birth year], 53 y.o., female Today's Date: 05/09/2024  END OF SESSION:  PT End of Session - 05/09/24 0933     Visit Number 4    Number of Visits 17    Date for Recertification  06/22/24    PT Start Time 0930    PT Stop Time 1010    PT Time Calculation (min) 40 min    Activity Tolerance Patient tolerated treatment well    Behavior During Therapy [facility] for tasks assessed/performed            Past Medical History:  Diagnosis Date   Abnormal glucose    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 Lynwood Nasuti   Hypertension    Hyperthyroidism    in the past   Hypothyroidism    Iron deficiency anemia    Palpitation    Palpitations 10/15/2023   Personal history of chemotherapy    Personal history of radiation therapy    Spontaneous ecchymoses    Tachycardia    Vitamin D  deficiency    Past Surgical History:  Procedure Laterality Date   BREAST BIOPSY     BREAST EXCISIONAL BIOPSY     BREAST LUMPECTOMY Right 08/20/2020   w/ rad w/ chemo   BREAST LUMPECTOMY WITH RADIOACTIVE SEED LOCALIZATION Right 08/20/2020   Procedure: RADIOACTIVE SEED GUIDED RIGHT BREAST LUMPECTOMY;  Surgeon: Vernetta Berg, MD;  Location: [facility] OR;  Service: General;  Laterality: Right;   CHOLECYSTECTOMY     COLONOSCOPY  05/18/2018   KNEE SURGERY     PORT-A-CATH REMOVAL N/A 04/30/2021   Procedure: REMOVAL PORT-A-CATH;  Surgeon: Vernetta Berg, MD;  Location: [facility];  Service: General;  Laterality: N/A;   PORTACATH PLACEMENT Left 04/15/2020   Procedure: INSERTION PORT-A-CATH;  Surgeon: Vernetta Berg, MD;  Location: [facility];  Service: General;  Laterality: Left;   RADIOACTIVE SEED GUIDED  AXILLARY SENTINEL LYMPH NODE Right 08/20/2020   Procedure: RADIOACTIVE SEED TARGETED RIGHT AXILLARY LYMPH NODE DISSECTION;  Surgeon: Vernetta Berg, MD;  Location: [facility] OR;  Service: General;  Laterality: Right;   TONSILLECTOMY     Patient Active Problem List   Diagnosis Date Noted   Rash and nonspecific skin eruption 05/03/2024   Hematuria, microscopic 05/03/2024   Allergic contact dermatitis 05/03/2024   Leukocytes in urine 05/03/2024   Counseling about travel 02/08/2024   Acute swimmer's ear of left side 02/08/2024   Class 1 obesity due to excess calories with serious comorbidity and body mass index (BMI) of 30.0 to 30.9 in adult 02/07/2024   Chronic pain of right knee 02/07/2024   Palpitations 10/15/2023   Encounter for general adult medical examination w/o abnormal findings 08/03/2023   Other abnormal glucose 01/20/2023   Trigger middle finger of left hand 01/20/2023   Constipation 07/15/2021   Chronic idiopathic constipation 07/15/2021   Dysphagia 07/15/2021   Gastroesophageal reflux disease 07/15/2021   Uterine leiomyoma 04/19/2020   Genetic testing 04/19/2020   Family history of ovarian cancer 04/03/2020   Family history of leukemia 04/03/2020   Family history of breast cancer 04/03/2020   Malignant neoplasm of upper-outer quadrant of right breast in female, estrogen receptor negative (HCC) 03/28/2020   Overweight (BMI 25.0-29.9) 12/05/2019   Essential hypertension,  benign 05/29/2018   Iritis of left eye 05/29/2018   Iron deficiency anemia due to chronic blood loss 05/29/2018   Primary hypothyroidism 05/29/2018    PCP: Catheryn Slocumb, MD  REFERRING PROVIDER: Lonell Sprang, DO  REFERRING DIAG: [id number] (ICD-10-CM) - Chronic pain of right knee M17.11 (ICD-10-CM) - Unilateral primary osteoarthritis, right knee  THERAPY DIAG:  Chronic pain of right knee  Stiffness of right knee, not elsewhere classified  Rationale for Evaluation and Treatment:  Rehabilitation  ONSET DATE: chronic  SUBJECTIVE:   SUBJECTIVE STATEMENT: Pt still reporting pain with stairs of 3/10 at times. No pain today upon arrival.   Pt reports 4 previous Rt knee surgeries. Despite history she is very active at the gym.   In the spring hurt the knee a Alberg in a mud run.  Currently c/o pain at the medial, anterior and lateral knee.    PERTINENT HISTORY: + R breast CA Last knee surgery 15+ years ago PAIN:  Are you having pain? Yes: NPRS scale: no pain today, worse pain reported 3/10 since last PT visit Pain location: ant, lateral and medial right knee Pain description: pinching (lower) pulling (upper) Aggravating factors: descending stairs, pivoting, squatting Relieving factors: rest, exercises  PRECAUTIONS: None  RED FLAGS: None   WEIGHT BEARING RESTRICTIONS: No  FALLS:  Has patient fallen in last 6 months? No and Yes. Number of falls 1in a run  LIVING ENVIRONMENT: Lives with: lives with their spouse Lives in: House/apartment Stairs: Yes: Internal: 12 steps; on right going up Has following equipment at home: None  OCCUPATION: negotiator  PLOF: Independent  PATIENT GOALS: Softball. Pickleball, sew, paint, spinning class  NEXT MD VISIT: 06/14/24  OBJECTIVE:  Note: Objective measures were completed at Evaluation unless otherwise noted.  DIAGNOSTIC FINDINGS: none listed  PATIENT SURVEYS:  PSFS: THE PATIENT SPECIFIC FUNCTIONAL SCALE  Place score of 0-10 (0 = unable to perform activity and 10 = able to perform activity at the same level as before injury or problem)  Activity Date: 04/25/2024 05/09/24   Descending stairs 7 7   2.   squatting 7 4   3.   in/out car 7 7   4.      Total Score 7 6     Total Score = Sum of activity scores/number of activities  Minimally Detectable Change: 3 points (for single activity); 2 points (for average score)  [facility] (nd). The Patient Specific Functional Scale . Retrieved from  [email or web address]   COGNITION: Overall cognitive status: Within functional limits for tasks assessed     SENSATION: WFL  EDEMA:  Not measured  MUSCLE LENGTH: Hamstrings: Right mild restriction   POSTURE: L calcaneus valgus, mild L  genu varus, R knee flexed in standing,   PALPATION: 1+ at lateral jt line  LOWER EXTREMITY ROM:  Active/Passive ROM Right Eval 04/26/2024 Rt / Left Eval 05/09/2024  Hip flexion 95 A   Hip extension    Hip abduction 40 A   Hip adduction    Hip internal rotation    Hip external rotation    Knee flexion 110A/115 AAROM 118 / 132  Knee extension +8 to 0   Ankle dorsiflexion    Ankle plantarflexion    Ankle inversion    Ankle eversion     (Blank rows = not tested)  LOWER EXTREMITY MMT:  MMT Right Eval 04/26/2024 Left Eval 04/25/2024 Rt  05/09/24  Hip flexion 5  5  Hip extension     Hip  abduction 5-  5  Hip adduction 5-  5  Hip internal rotation     Hip external rotation     Knee flexion 5-  5-  Knee extension 4+  5  Ankle dorsiflexion     Ankle plantarflexion     Ankle inversion     Ankle eversion      (Blank rows = not tested)  LOWER EXTREMITY SPECIAL TESTS:  Knee special tests: Anterior drawer test: negative, McMurray's test: negative, and Bounce neg  FUNCTIONAL TESTS:  5 times sit to stand: 10 sec  GAIT: Distance walked: Medical Sales Representative device utilized: None Level of assistance: Complete Independence Comments: ---  ===========================================                                                                                                                      TREATMENT DATE:  05/09/24 ThereEx:  Sci bike: Level 4 x 6 minutes  ROM and MMT updated TherActivites:  Double Leg Press: 87# 2 x 10  Single Leg Press: 50# 2 x 10 bil LE Step downs on 4 inch step 2 x 10 leading with heel of her left LE c finger touch support in parallel bars Lateral  step downs 4 inch step 2 x 10 c UE support Sit to stand: focusing on Rt quad strengthening with Rt LE positioned back and Left heel on floor out front x 10 ( pt instructed to work on preventing her Rt hip from IR)     05/05/2024 Recumbent bike Seat 7 5 minutes Level 6-8 :50 12 + MPH and sprint the last :10 (max 28.5 MPH) Prone quadriceps stretch 4 x 20 seconds Seated straight leg raises 2 sets of 10 with 2.5# Seated knee extension machine 90-40 degrees up bilateral, down unilateral (right) and slow eccentrics 35# 12 reps  Neuromuscular re-education: Tandem balance 4 x 20 seconds each eyes open head turning; eyes closed Single-leg balance eyes open; head turning and eyes closed 4 x 10 seconds each   04/28/2024 Supine quadriceps stretch 4 x 20 seconds Prone quadriceps stretch 2 x 20 seconds Seated straight leg raises 2 sets of 10 with 1# (2# next visit) Seated knee extension machine 90-40 degrees up bilateral, down unilateral (right) and slow eccentrics 25# 12 reps and 35# 12 reps  Neuromuscular re-education: Tandem balance 4 x 20 seconds each eyes open; head turning; eyes closed Single-leg balance 10 x 10 seconds   04/25/2024 TherEx: HEP handout provided with patient performing one set of each activity for appropriate form. Verbal and tactile cues provided.   Self-Care: POC, adjustments with current gym program    PATIENT EDUCATION:  Education details: HEP Person educated: Patient Education method: Explanation, Demonstration, Tactile cues, Verbal cues, and Handouts Education comprehension: verbalized understanding, returned demonstration, and verbal cues required  HOME EXERCISE PROGRAM: Access Code: [email or web address] URL: [email or web address] Date: 04/28/2024 Prepared by: Lamar Ivory  Exercises - Supine Quadriceps Stretch with Strap on Table  - 2 x daily - 7 x weekly -  1 sets - 4-5 reps - 10-15 seconds (longer is ok) hold - Seated Straight Leg Raise   - 1 x  daily - 5-7 x weekly - 5 sets - 10 reps - 2 seconds hold - Tandem Stance  - 1 x daily - 3-7 x weekly - 1 sets - 10 reps - 20 second hold - Single Leg Stance  - 1 x daily - 3-7 x weekly - 1 sets - 10 reps - 10 seconds hold  ASSESSMENT:  CLINICAL IMPRESSION: Treatment focusing on stair navigation training as well as quad strengthening. Pt with improvements in MMT and ROM in her Rt LE.  Recommending continued skilled PT interventions.     OBJECTIVE IMPAIRMENTS: decreased ROM, decreased strength, impaired flexibility, and pain.   ACTIVITY LIMITATIONS: sporting  PARTICIPATION LIMITATIONS: gym activities sometimes affected  PERSONAL FACTORS: 1-2 comorbidities: CA, htn are also affecting patient's functional outcome.   REHAB POTENTIAL: Good  CLINICAL DECISION MAKING: Stable/uncomplicated  EVALUATION COMPLEXITY: Low   GOALS: Goals reviewed with patient? Yes  SHORT TERM GOALS: Target date: 05/18/2024   Patient will show compliance with initial HEP. Baseline: Goal status: Met 04/28/2024  2.  Patient will report pain level to decrease by 1 level.  Baseline:  Goal status: Met 05/09/24    LONG TERM GOALS: Target date: 06/22/2024  8 weeks    Patient will be independent with final HEP in order to maintain and progress upon functional gains made within PT. Baseline:  Goal status: INITIAL   2.  Patient will report pain levels no greater than 2/10 in order to show an improved overall quality of life. Baseline:  Goal status: ongoing 05/09/24  3.  Patient will increase PSFS to at least 3 points in order to show a significantly improved subjective disability rating. Baseline:  Goal status: INITIAL  4.  Increase Active extension by 4 or more degrees and flexion by 5 degrees. Baseline:  Goal status: INITIAL  5.  Increase strength of R LE by 1/2 grade to 1 full grade in R LE.   Baseline:  Goal status: INITIAL     PLAN:  PT FREQUENCY: 1-2x/week  PT DURATION: 8  weeks  PLANNED INTERVENTIONS: [id number]- PT Re-evaluation, 97750- Physical Performance Testing, 97110-Therapeutic exercises, 97530- Therapeutic activity, [id number]- Neuromuscular re-education, 97535- Self Care, [id number]- Manual therapy, [id number]- Gait training, [phone]- Orthotic Initial, [phone]- Orthotic/Prosthetic subsequent, [phone]- Canalith repositioning, [phone]- Aquatic Therapy, [phone]- Electrical stimulation (unattended), [phone]- Electrical stimulation (manual), [id number]- Vasopneumatic device, [id number]- Ultrasound, [id number]- Traction (mechanical), [id number]- Ionotophoresis 4mg /ml Dexamethasone , [id number] (1-2 muscles), [id number] (3+ muscles)- Dry Needling, Patient/Family education, Balance training, Stair training, Taping, Joint mobilization, Joint manipulation, Spinal manipulation, Spinal mobilization, Vestibular training, DME instructions, Cryotherapy, and Moist heat  PLAN FOR NEXT SESSION: Continue with quad strengthening and stairs Use HHD to test bil LE's to see if there is significant difference (HHD unavailable this visit)  Delon Lunger, PT, MPT 05/09/2024 11:33 AM   05/09/2024  11:33 AM   "

## 2024-05-17 ENCOUNTER — Ambulatory Visit: Payer: Self-pay | Admitting: Family Medicine

## 2024-05-17 NOTE — Progress Notes (Signed)
"  Negative urine culture   "

## 2024-05-22 ENCOUNTER — Encounter

## 2024-05-23 NOTE — Therapy (Signed)
 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 Lynwood Nasuti   Hypertension    Hyperthyroidism    in the past   Hypothyroidism    Iron deficiency anemia    Palpitation    Palpitations 10/15/2023   Personal history of chemotherapy    Personal history of radiation therapy    Spontaneous ecchymoses    Tachycardia    Vitamin D  deficiency    Past Surgical History:  Procedure Laterality Date   BREAST BIOPSY     BREAST EXCISIONAL BIOPSY     BREAST LUMPECTOMY Right 08/20/2020   w/ rad w/ chemo   BREAST LUMPECTOMY WITH RADIOACTIVE SEED LOCALIZATION Right 08/20/2020   Procedure: RADIOACTIVE SEED GUIDED RIGHT BREAST LUMPECTOMY;  Surgeon: Vernetta Berg, MD;  Location: [facility] OR;  Service: General;  Laterality: Right;   CHOLECYSTECTOMY     COLONOSCOPY  05/18/2018   KNEE SURGERY     PORT-A-CATH REMOVAL N/A 04/30/2021   Procedure: REMOVAL PORT-A-CATH;  Surgeon: Vernetta Berg, MD;  Location: [facility];  Service: General;  Laterality: N/A;   PORTACATH PLACEMENT Left 04/15/2020   Procedure: INSERTION PORT-A-CATH;  Surgeon: Vernetta Berg, MD;  Location: [facility];  Service: General;  Laterality: Left;   RADIOACTIVE SEED GUIDED  AXILLARY SENTINEL LYMPH NODE Right 08/20/2020   Procedure: RADIOACTIVE SEED TARGETED RIGHT AXILLARY LYMPH NODE DISSECTION;  Surgeon: Vernetta Berg, MD;  Location: [facility];  Service: General;  Laterality: Right;   TONSILLECTOMY     Patient Active Problem List   Diagnosis Date Noted   Rash and nonspecific skin eruption 05/03/2024   Hematuria, microscopic 05/03/2024   Allergic contact dermatitis 05/03/2024   Leukocytes in urine 05/03/2024   Counseling about travel 02/08/2024   Acute swimmer's ear of left side 02/08/2024   Class 1 obesity due to excess calories with serious comorbidity and body mass index (BMI) of 30.0 to 30.9 in adult 02/07/2024   Chronic pain of right knee 02/07/2024   Palpitations 10/15/2023   Encounter for general adult medical examination w/o abnormal findings 08/03/2023   Other abnormal glucose 01/20/2023   Trigger middle finger of left hand 01/20/2023   Constipation 07/15/2021   Chronic idiopathic constipation 07/15/2021   Dysphagia 07/15/2021   Gastroesophageal reflux disease 07/15/2021   Uterine leiomyoma 04/19/2020   Genetic testing 04/19/2020   Family history of ovarian cancer 04/03/2020   Family history of leukemia 04/03/2020   Family history of breast cancer 04/03/2020   Malignant neoplasm of upper-outer quadrant of right breast in female, estrogen receptor negative (HCC) 03/28/2020   Overweight (BMI 25.0-29.9) 12/05/2019   Essential  hypertension, benign 05/29/2018   Iritis of left eye 05/29/2018   Iron deficiency anemia due to chronic blood loss 05/29/2018   Primary hypothyroidism 05/29/2018    PCP: Catheryn Slocumb, MD  REFERRING PROVIDER: Lonell Sprang, DO  REFERRING DIAG: [phone] (ICD-10-CM) - Chronic pain of right knee M17.11 (ICD-10-CM) - Unilateral primary osteoarthritis, right knee  THERAPY DIAG:  Chronic pain of right knee  Stiffness of right knee, not elsewhere classified  Aftercare following surgery for neoplasm  Rationale for  Evaluation and Treatment: Rehabilitation  ONSET DATE: chronic  SUBJECTIVE:   SUBJECTIVE STATEMENT: Pt reports jumping jacks can irritate the knee.  Pt reports 4 previous Rt knee surgeries. Despite history she is very active at the gym.   In the spring hurt the knee a [place] in a mud run.  Currently c/o pain at the medial, anterior and lateral knee.    PERTINENT HISTORY: + R breast CA Last knee surgery 15+ years ago  PAIN:   Are you having pain? Yes: NPRS scale: 0/10 6/10with jacks  Pain location: ant, lateral and medial right knee Pain description: pinching (lower) pulling (upper) Aggravating factors: descending stairs, pivoting, squatting Relieving factors: rest, exercises  PRECAUTIONS: None  RED FLAGS: None   WEIGHT BEARING RESTRICTIONS: No  FALLS:  Has patient fallen in last 6 months? No and Yes. Number of falls 1in a run  LIVING ENVIRONMENT: Lives with: lives with their spouse Lives in: House/apartment Stairs: Yes: Internal: 12 steps; on right going up Has following equipment at home: None  OCCUPATION: negotiator  PLOF: Independent  PATIENT GOALS: Softball. Pickleball, sew, paint, spinning class  NEXT MD VISIT: 06/14/24  OBJECTIVE:  Note: Objective measures were completed at Evaluation unless otherwise noted.  DIAGNOSTIC FINDINGS: none listed  PATIENT SURVEYS:  PSFS: THE PATIENT SPECIFIC FUNCTIONAL SCALE  Place score of 0-10 (0 = unable to perform activity and 10 = able to perform activity at the same level as before injury or problem)  Activity Date: 04/25/2024 05/09/24   Descending stairs 7 7   2.   squatting 7 4   3.   in/out car 7 7   4.      Total Score 7 6     Total Score = Sum of activity scores/number of activities  Minimally Detectable Change: 3 points (for single activity); 2 points (for average score)  [facility] (nd). The Patient Specific Functional Scale . Retrieved from  [email or web address]   COGNITION: Overall cognitive status: Within functional limits for tasks assessed     SENSATION: WFL  EDEMA:  Not measured  MUSCLE LENGTH: Hamstrings: Right mild restriction   POSTURE: L calcaneus valgus, mild L  genu varus, R knee flexed in standing,   PALPATION: 1+ at lateral jt line  LOWER EXTREMITY ROM:  Active/Passive ROM Right Eval 04/26/2024 Rt / Left Eval 05/09/2024  Hip flexion 95 A   Hip extension    Hip abduction 40 A   Hip adduction    Hip internal rotation    Hip external rotation    Knee flexion 110A/115 AAROM 118 / 132  Knee extension +8 to 0   Ankle dorsiflexion    Ankle plantarflexion    Ankle inversion    Ankle eversion     (Blank rows = not tested)  LOWER EXTREMITY MMT:  MMT Right Eval 04/26/2024 Left Eval 04/25/2024 Rt  05/09/24  Hip flexion 5  5  Hip extension     Hip abduction 5-  5  Hip  adduction 5-  5  Hip internal rotation     Hip external rotation     Knee flexion 5-  5-  Knee extension 4+  5  Ankle dorsiflexion     Ankle plantarflexion     Ankle inversion     Ankle eversion      (Blank rows = not tested)  LOWER EXTREMITY SPECIAL TESTS:  Knee special tests: Anterior drawer test: negative, McMurray's test: negative, and Bounce neg  FUNCTIONAL TESTS:  5 times sit to stand: 10 sec  GAIT: Distance walked: Orthoptist utilized: None Level of assistance: Complete Independence Comments: ---  ===========================================                                                                                                                      TREATMENT DATE: R knee 05/24/24 ThereEx:  Rec bike level 5   7 min TherActivites:  Double Leg Press: 100# 3 x 10  Single Leg Press: 56# 2 x 10 bil LE Step downs on 4 inch step 2 x 10 leading with heel of her left LE c finger touch support in parallel bars Lateral step downs 4 inch step  2 x 10 c UE support Sit to stand: focusing on Rt quad strengthening with Rt LE positioned back and Left heel on floor out front 2x8  5#KB 17  chair height Neuro: UUDD on Air ex 20x2 Tandem on Airex  4 x30 sec 05/09/24 ThereEx:  Sci bike: Level 4 x 6 minutes  ROM and MMT updated TherActivites:  Double Leg Press: 87# 2 x 10  Single Leg Press: 50# 2 x 10 bil LE Step downs on 4 inch step 2 x 10 leading with heel of her left LE c finger touch support in parallel bars Lateral step downs 4 inch step 2 x 10 c UE support Sit to stand: focusing on Rt quad strengthening with Rt LE positioned back and Left heel on floor out front x 10 ( pt instructed to work on preventing her Rt hip from IR)     05/05/2024 Recumbent bike Seat 7 5 minutes Level 6-8 :50 12 + MPH and sprint the last :10 (max 28.5 MPH) Prone quadriceps stretch 4 x 20 seconds Seated straight leg raises 2 sets of 10 with 2.5# Seated knee extension machine 90-40 degrees up bilateral, down unilateral (right) and slow eccentrics 35# 12 reps  Neuromuscular re-education: Tandem balance 4 x 20 seconds each eyes open head turning; eyes closed Single-leg balance eyes open; head turning and eyes closed 4 x 10 seconds each   04/28/2024 Supine quadriceps stretch 4 x 20 seconds Prone quadriceps stretch 2 x 20 seconds Seated straight leg raises 2 sets of 10 with 1# (2# next visit) Seated knee extension machine 90-40 degrees up bilateral, down unilateral (right) and slow eccentrics 25# 12 reps and 35# 12 reps  Neuromuscular re-education: Tandem balance 4 x 20 seconds each eyes open; head turning; eyes closed Single-leg balance 10 x 10  seconds   04/25/2024 TherEx: HEP handout provided with patient performing one set of each activity for appropriate form. Verbal and tactile cues provided.   Self-Care: POC, adjustments with current gym program    PATIENT EDUCATION:  Education details: HEP Person educated: Patient Education method:  Explanation, Demonstration, Tactile cues, Verbal cues, and Handouts Education comprehension: verbalized understanding, returned demonstration, and verbal cues required  HOME EXERCISE PROGRAM: Access Code: [email or web address] URL: [email or web address] Date: 04/28/2024 Prepared by: Lamar Ivory  Exercises - Supine Quadriceps Stretch with Strap on Table  - 2 x daily - 7 x weekly - 1 sets - 4-5 reps - 10-15 seconds (longer is ok) hold - Seated Straight Leg Raise   - 1 x daily - 5-7 x weekly - 5 sets - 10 reps - 2 seconds hold - Tandem Stance  - 1 x daily - 3-7 x weekly - 1 sets - 10 reps - 20 second hold - Single Leg Stance  - 1 x daily - 3-7 x weekly - 1 sets - 10 reps - 10 seconds hold  ASSESSMENT:  CLINICAL IMPRESSION: Pt needed VC for foot position with balance work and to not IR through the knee.  Demonstrated understanding.  OBJECTIVE IMPAIRMENTS: decreased ROM, decreased strength, impaired flexibility, and pain.   ACTIVITY LIMITATIONS: sporting  PARTICIPATION LIMITATIONS: gym activities sometimes affected  PERSONAL FACTORS: 1-2 comorbidities: CA, htn are also affecting patient's functional outcome.   REHAB POTENTIAL: Good  CLINICAL DECISION MAKING: Stable/uncomplicated  EVALUATION COMPLEXITY: Low   GOALS: Goals reviewed with patient? Yes  SHORT TERM GOALS: Target date: 05/18/2024   Patient will show compliance with initial HEP. Baseline: Goal status: Met 04/28/2024  2.  Patient will report pain level to decrease by 1 level.  Baseline:  Goal status: Met 05/09/24    LONG TERM GOALS: Target date: 06/22/2024  8 weeks    Patient will be independent with final HEP in order to maintain and progress upon functional gains made within PT. Baseline:  Goal status: INITIAL   2.  Patient will report pain levels no greater than 2/10 in order to show an improved overall quality of life. Baseline:  Goal status: ongoing 05/09/24  3.  Patient will increase PSFS to at  least 3 points in order to show a significantly improved subjective disability rating. Baseline:  Goal status: INITIAL  4.  Increase Active extension by 4 or more degrees and flexion by 5 degrees. Baseline:  Goal status: INITIAL  5.  Increase strength of R LE by 1/2 grade to 1 full grade in R LE.   Baseline:  Goal status: INITIAL     PLAN:  PT FREQUENCY: 1-2x/week  PT DURATION: 8 weeks  PLANNED INTERVENTIONS: [id number]- PT Re-evaluation, 97750- Physical Performance Testing, 97110-Therapeutic exercises, 97530- Therapeutic activity, [id number]- Neuromuscular re-education, 97535- Self Care, [id number]- Manual therapy, [id number]- Gait training, [phone]- Orthotic Initial, [phone]- Orthotic/Prosthetic subsequent, [phone]- Canalith repositioning, [phone]- Aquatic Therapy, [phone]- Electrical stimulation (unattended), [phone]- Electrical stimulation (manual), [id number]- Vasopneumatic device, [id number]- Ultrasound, [id number]- Traction (mechanical), [id number]- Ionotophoresis 4mg /ml Dexamethasone , 79439 (1-2 muscles), [id number] (3+ muscles)- Dry Needling, Patient/Family education, Balance training, Stair training, Taping, Joint mobilization, Joint manipulation, Spinal manipulation, Spinal mobilization, Vestibular training, DME instructions, Cryotherapy, and Moist heat  PLAN FOR NEXT SESSION: Continue with quad strengthening and stairs Use HHD to test bil LE's to see if there is significant difference (HHD unavailable this visit)  Burnard Meth, PT 05/24/2024  10:23 AM    "

## 2024-05-24 ENCOUNTER — Ambulatory Visit

## 2024-05-24 DIAGNOSIS — Z483 Aftercare following surgery for neoplasm: Secondary | ICD-10-CM

## 2024-05-24 DIAGNOSIS — M25661 Stiffness of right knee, not elsewhere classified: Secondary | ICD-10-CM

## 2024-05-24 DIAGNOSIS — M25561 Pain in right knee: Secondary | ICD-10-CM | POA: Diagnosis not present

## 2024-05-24 DIAGNOSIS — G8929 Other chronic pain: Secondary | ICD-10-CM | POA: Diagnosis not present

## 2024-05-26 ENCOUNTER — Encounter: Payer: Self-pay | Admitting: Rehabilitative and Restorative Service Providers"

## 2024-05-26 ENCOUNTER — Ambulatory Visit: Admitting: Rehabilitative and Restorative Service Providers"

## 2024-05-26 DIAGNOSIS — G8929 Other chronic pain: Secondary | ICD-10-CM

## 2024-05-26 DIAGNOSIS — M25561 Pain in right knee: Secondary | ICD-10-CM | POA: Diagnosis not present

## 2024-05-26 DIAGNOSIS — M25661 Stiffness of right knee, not elsewhere classified: Secondary | ICD-10-CM | POA: Diagnosis not present

## 2024-05-26 NOTE — Therapy (Signed)
 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 Lynwood Nasuti   Hypertension    Hyperthyroidism    in the past   Hypothyroidism    Iron deficiency anemia    Palpitation    Palpitations 10/15/2023   Personal history of chemotherapy    Personal history of radiation therapy    Spontaneous ecchymoses    Tachycardia    Vitamin D  deficiency    Past Surgical History:  Procedure Laterality Date   BREAST BIOPSY     BREAST EXCISIONAL BIOPSY     BREAST LUMPECTOMY Right 08/20/2020   w/ rad w/ chemo   BREAST LUMPECTOMY WITH RADIOACTIVE SEED LOCALIZATION Right 08/20/2020   Procedure: RADIOACTIVE SEED GUIDED RIGHT BREAST LUMPECTOMY;  Surgeon: Vernetta Berg, MD;  Location: [facility] OR;  Service: General;  Laterality: Right;   CHOLECYSTECTOMY     COLONOSCOPY  05/18/2018   KNEE SURGERY     PORT-A-CATH REMOVAL N/A 04/30/2021   Procedure: REMOVAL PORT-A-CATH;  Surgeon: Vernetta Berg, MD;  Location: [facility];  Service: General;  Laterality: N/A;   PORTACATH PLACEMENT Left 04/15/2020   Procedure: INSERTION PORT-A-CATH;  Surgeon: Vernetta Berg, MD;  Location:  SURGERY CENTER;  Service: General;  Laterality: Left;   RADIOACTIVE SEED GUIDED  AXILLARY SENTINEL LYMPH NODE Right 08/20/2020   Procedure: RADIOACTIVE SEED TARGETED RIGHT AXILLARY LYMPH NODE DISSECTION;  Surgeon: Vernetta Berg, MD;  Location: [facility] OR;  Service: General;  Laterality: Right;   TONSILLECTOMY     Patient Active Problem List   Diagnosis Date Noted   Rash and nonspecific skin eruption 05/03/2024   Hematuria, microscopic 05/03/2024   Allergic contact dermatitis 05/03/2024   Leukocytes in urine 05/03/2024   Counseling about travel 02/08/2024   Acute swimmer's ear of left side 02/08/2024   Class 1 obesity due to excess calories with serious comorbidity and body mass index (BMI) of 30.0 to 30.9 in adult 02/07/2024   Chronic pain of right knee 02/07/2024   Palpitations 10/15/2023   Encounter for general adult medical examination w/o abnormal findings 08/03/2023   Other abnormal glucose 01/20/2023   Trigger middle finger of left hand 01/20/2023   Constipation 07/15/2021   Chronic idiopathic constipation 07/15/2021   Dysphagia 07/15/2021   Gastroesophageal reflux disease 07/15/2021   Uterine leiomyoma 04/19/2020   Genetic testing 04/19/2020   Family history of ovarian cancer 04/03/2020   Family history of leukemia 04/03/2020   Family history of breast cancer 04/03/2020   Malignant neoplasm of upper-outer quadrant of right breast in female, estrogen receptor negative (HCC) 03/28/2020   Overweight (BMI 25.0-29.9) 12/05/2019  Essential hypertension, benign 05/29/2018   Iritis of left eye 05/29/2018   Iron deficiency anemia due to chronic blood loss 05/29/2018   Primary hypothyroidism 05/29/2018    PCP: Catheryn Slocumb, MD  REFERRING PROVIDER: Lonell Sprang, DO  REFERRING DIAG: [phone] (ICD-10-CM) - Chronic pain of right knee M17.11 (ICD-10-CM) - Unilateral primary osteoarthritis, right knee  THERAPY DIAG:  Chronic pain of right knee  Stiffness of right knee, not elsewhere classified  Rationale for Evaluation and Treatment:  Rehabilitation  ONSET DATE: chronic  SUBJECTIVE:   SUBJECTIVE STATEMENT: Shannon Kane reports good overall progress since starting supervised physical therapy.  With most activities like walking she has very Runkles pain.  She does have increasing pain with more athletic activities like jumping.  Pt reports 4 previous Rt knee surgeries. Despite history she is very active at the gym.   In the spring hurt the knee a Gaughan in a mud run.  Currently c/o pain at the medial, anterior and lateral knee.    PERTINENT HISTORY: + R breast CA Last knee surgery 15+ years ago  PAIN:   Are you having pain? Yes: NPRS scale: 0/10 unless jumping or end-range knee flexion  Pain location: Medial right knee Pain description: pinch/sharp Aggravating factors: jumping, end range flexion, squatting Relieving factors: rest, exercises  PRECAUTIONS: None  RED FLAGS: None   WEIGHT BEARING RESTRICTIONS: No  FALLS:  Has patient fallen in last 6 months? No and Yes. Number of falls 1in a run  LIVING ENVIRONMENT: Lives with: lives with their spouse Lives in: House/apartment Stairs: Yes: Internal: 12 steps; on right going up Has following equipment at home: None  OCCUPATION: negotiator  PLOF: Independent  PATIENT GOALS: Softball. Pickleball, sew, paint, spinning class  NEXT MD VISIT: 06/14/24  OBJECTIVE:  Note: Objective measures were completed at Evaluation unless otherwise noted.  DIAGNOSTIC FINDINGS: none listed  PATIENT SURVEYS:  PSFS: THE PATIENT SPECIFIC FUNCTIONAL SCALE  Place score of 0-10 (0 = unable to perform activity and 10 = able to perform activity at the same level as before injury or problem)  Activity Date: 04/25/2024 05/09/24   Descending stairs 7 7   2.   squatting 7 4   3.   in/out car 7 7   4.      Total Score 7 6     Total Score = Sum of activity scores/number of activities  Minimally Detectable Change: 3 points (for single activity); 2 points (for average score)  [facility] (nd). The Patient Specific Functional Scale . Retrieved from [email or web address]   COGNITION: Overall cognitive status: Within functional limits for tasks assessed     SENSATION: WFL  EDEMA:  Not measured  MUSCLE LENGTH: Hamstrings: Right mild restriction   POSTURE: L calcaneus valgus, mild L  genu varus, R knee flexed in standing,   PALPATION: 1+ at lateral jt line  LOWER EXTREMITY ROM:  Active/Passive ROM Right Eval 04/26/2024 Rt / Left Eval 05/09/2024 Left/Right 05/26/2024  Hip flexion 95 A    Hip extension     Hip abduction 40 A    Hip adduction     Hip internal rotation     Hip external rotation     Knee flexion 110A/115 AAROM 118 / 132 140/120  Knee extension +8 to 0  +5/-1  Ankle dorsiflexion     Ankle plantarflexion     Ankle inversion     Ankle eversion      (Blank rows = not tested)  LOWER  EXTREMITY MMT:  MMT Right Eval 04/26/2024 Left Eval 04/25/2024 Rt  05/09/24 Left/Right 05/26/2024  Hip flexion 5  5   Hip extension      Hip abduction 5-  5   Hip adduction 5-  5   Hip internal rotation      Hip external rotation      Knee flexion 5-  5-   Knee extension 4+  5 99.1/98.2  Ankle dorsiflexion      Ankle plantarflexion      Ankle inversion      Ankle eversion       (Blank rows = not tested)  LOWER EXTREMITY SPECIAL TESTS:  Knee special tests: Anterior drawer test: negative, McMurray's test: negative, and Bounce neg  FUNCTIONAL TESTS:  5 times sit to stand: 10 sec  GAIT: Distance walked: Medical Sales Representative device utilized: None Level of assistance: Complete Independence Comments: ---  ===========================================                                                                                                                      TREATMENT DATE: R knee 05/26/2024 Recumbent bike Seat 7 5 minutes Level 5-9 :50 12 + MPH and sprint the last :10 (max 27.5  MPH) Prone quadriceps stretch 4 x 20 seconds Seated straight leg raises 3 sets of 10 with 3#  Functional Activities (to help with hip stabilization for running/jumping activities): Hip hike and push 2 sets of 5 for 5 seconds each side  Neuromuscular re-education: Verbally reviewed Tandem balance and Single-leg balance eyes open; head turning and eyes closed    05/24/24 ThereEx:  Rec bike level 5   7 min TherActivites:  Double Leg Press: 100# 3 x 10  Single Leg Press: 56# 2 x 10 bil LE Step downs on 4 inch step 2 x 10 leading with heel of her left LE c finger touch support in parallel bars Lateral step downs 4 inch step 2 x 10 c UE support Sit to stand: focusing on Rt quad strengthening with Rt LE positioned back and Left heel on floor out front 2x8  5#KB 17  chair height Neuro: UUDD on Air ex 20x2 Tandem on Airex  4 x30 sec   05/09/24 ThereEx:  Sci bike: Level 4 x 6 minutes  ROM and MMT updated TherActivites:  Double Leg Press: 87# 2 x 10  Single Leg Press: 50# 2 x 10 bil LE Step downs on 4 inch step 2 x 10 leading with heel of her left LE c finger touch support in parallel bars Lateral step downs 4 inch step 2 x 10 c UE support Sit to stand: focusing on Rt quad strengthening with Rt LE positioned back and Left heel on floor out front x 10 ( pt instructed to work on preventing her Rt hip from IR)   PATIENT EDUCATION:  Education details: HEP Person educated: Patient Education method: Programmer, Multimedia, Facilities Manager, Actor cues, Verbal cues, and Handouts Education comprehension: verbalized understanding, returned demonstration, and verbal cues  required  HOME EXERCISE PROGRAM: Access Code: [email or web address] URL: [email or web address] Date: 05/26/2024 Prepared by: Lamar Kane  Exercises - Supine Quadriceps Stretch with Strap on Table  - 2 x daily - 7 x weekly - 1 sets - 4-5 reps - 10-15 seconds (longer is ok) hold - Seated Straight Leg Raise   - 1 x daily - 5-7 x  weekly - 5 sets - 10 reps - 2 seconds hold - Tandem Stance  - 1 x daily - 3-7 x weekly - 1 sets - 10 reps - 20 second hold - Single Leg Stance  - 1 x daily - 3-7 x weekly - 1 sets - 10 reps - 10 seconds hold - Standing Hip Hiking  - 2 x daily - 7 x weekly - 2 sets - 5 reps - 5 seconds hold  ASSESSMENT:  CLINICAL IMPRESSION: Shannon Kane reports significant overall progress since starting supervised physical therapy.  She still has difficulty with endrange knee flexion, jumping and other more athletic activities.  Objective strength was very good today, although she is at the max of what the hand-held strength gauge can record without significant errors above this level.  Normal quadriceps strength for Suhayla to postdate and more athletic activities is approximately 80% body weight or approximately 165 pounds (current quadricep strength is approximately 100 pounds).  Thus, difficulty with jumping and more athletic activities makes sense.  [place] will continue to benefit from supervised physical therapy to finalize her long-term strengthening program and prepare her for transition into more independent rehabilitation.  OBJECTIVE IMPAIRMENTS: decreased ROM, decreased strength, impaired flexibility, and pain.   ACTIVITY LIMITATIONS: sporting  PARTICIPATION LIMITATIONS: gym activities sometimes affected  PERSONAL FACTORS: 1-2 comorbidities: CA, htn are also affecting patient's functional outcome.   REHAB POTENTIAL: Good  CLINICAL DECISION MAKING: Stable/uncomplicated  EVALUATION COMPLEXITY: Low   GOALS: Goals reviewed with patient? Yes  SHORT TERM GOALS: Target date: 05/18/2024   Patient will show compliance with initial HEP. Baseline: Goal status: Met 04/28/2024  2.  Patient will report pain level to decrease by 1 level.  Baseline:  Goal status: Met 05/09/24    LONG TERM GOALS: Target date: 06/22/2024  8 weeks    Patient will be independent with final HEP in order to maintain and progress upon  functional gains made within PT. Baseline:  Goal status: INITIAL   2.  Patient will report pain levels no greater than 2/10 in order to show an improved overall quality of life. Baseline:  Goal status: Partially met 05/26/2024  3.  Patient will increase PSFS to at least 3 points in order to show a significantly improved subjective disability rating. Baseline:  Goal status: INITIAL  4.  Increase Active extension by 4 or more degrees and flexion by 5 degrees. Baseline:  Goal status: INITIAL  5.  Increase strength of R LE by 1/2 grade to 1 full grade in R LE.   Baseline:  Goal status: INITIAL     PLAN:  PT FREQUENCY: 1-2x/week  PT DURATION: 8 weeks  PLANNED INTERVENTIONS: [id number]- PT Re-evaluation, 97750- Physical Performance Testing, 97110-Therapeutic exercises, 97530- Therapeutic activity, [id number]- Neuromuscular re-education, 97535- Self Care, [id number]- Manual therapy, [id number]- Gait training, [phone]- Orthotic Initial, [phone]- Orthotic/Prosthetic subsequent, [phone]- Canalith repositioning, [phone]- Aquatic Therapy, [phone]- Electrical stimulation (unattended), [phone]- Electrical stimulation (manual), [id number]- Vasopneumatic device, [id number]- Ultrasound, [id number]- Traction (mechanical), [id number]- Ionotophoresis 4mg /ml Dexamethasone , [id number] (1-2 muscles), [id number] (3+ muscles)- Dry Needling, Patient/Family education, Balance training, Stair training, Taping, Joint mobilization, Joint manipulation, Spinal manipulation, Spinal  mobilization, Vestibular training, DME instructions, Cryotherapy, and Moist heat  PLAN FOR NEXT SESSION: Continue with quad strengthening emphasis and stairs.   Shannon Kane, PT, MPT 05/26/2024  5:12 PM    "

## 2024-05-30 ENCOUNTER — Ambulatory Visit: Admitting: Physical Therapy

## 2024-05-30 ENCOUNTER — Encounter: Payer: Self-pay | Admitting: Physical Therapy

## 2024-05-30 DIAGNOSIS — Z483 Aftercare following surgery for neoplasm: Secondary | ICD-10-CM

## 2024-05-30 DIAGNOSIS — M25661 Stiffness of right knee, not elsewhere classified: Secondary | ICD-10-CM | POA: Diagnosis not present

## 2024-05-30 DIAGNOSIS — M25561 Pain in right knee: Secondary | ICD-10-CM

## 2024-05-30 DIAGNOSIS — G8929 Other chronic pain: Secondary | ICD-10-CM

## 2024-05-30 NOTE — Therapy (Signed)
 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 Lynwood Nasuti   Hypertension    Hyperthyroidism    in the past   Hypothyroidism    Iron deficiency anemia    Palpitation    Palpitations 10/15/2023   Personal history of chemotherapy    Personal history of radiation therapy    Spontaneous ecchymoses    Tachycardia    Vitamin D  deficiency    Past Surgical History:  Procedure Laterality Date   BREAST BIOPSY     BREAST EXCISIONAL BIOPSY     BREAST LUMPECTOMY Right 08/20/2020   w/ rad w/ chemo   BREAST LUMPECTOMY WITH RADIOACTIVE SEED LOCALIZATION Right 08/20/2020   Procedure: RADIOACTIVE SEED GUIDED RIGHT BREAST LUMPECTOMY;  Surgeon: Vernetta Berg, MD;  Location: [facility] OR;  Service: General;  Laterality: Right;   CHOLECYSTECTOMY     COLONOSCOPY  05/18/2018   KNEE SURGERY     PORT-A-CATH REMOVAL N/A 04/30/2021   Procedure: REMOVAL PORT-A-CATH;  Surgeon: Vernetta Berg, MD;  Location: [facility];  Service: General;  Laterality: N/A;   PORTACATH PLACEMENT Left 04/15/2020   Procedure: INSERTION PORT-A-CATH;  Surgeon: Vernetta Berg, MD;  Location:  SURGERY CENTER;  Service: General;  Laterality: Left;   RADIOACTIVE SEED GUIDED  AXILLARY SENTINEL LYMPH NODE Right 08/20/2020   Procedure: RADIOACTIVE SEED TARGETED RIGHT AXILLARY LYMPH NODE DISSECTION;  Surgeon: Vernetta Berg, MD;  Location: [facility] OR;  Service: General;  Laterality: Right;   TONSILLECTOMY     Patient Active Problem List   Diagnosis Date Noted   Rash and nonspecific skin eruption 05/03/2024   Hematuria, microscopic 05/03/2024   Allergic contact dermatitis 05/03/2024   Leukocytes in urine 05/03/2024   Counseling about travel 02/08/2024   Acute swimmer's ear of left side 02/08/2024   Class 1 obesity due to excess calories with serious comorbidity and body mass index (BMI) of 30.0 to 30.9 in adult 02/07/2024   Chronic pain of right knee 02/07/2024   Palpitations 10/15/2023   Encounter for general adult medical examination w/o abnormal findings 08/03/2023   Other abnormal glucose 01/20/2023   Trigger middle finger of left hand 01/20/2023   Constipation 07/15/2021   Chronic idiopathic constipation 07/15/2021   Dysphagia 07/15/2021   Gastroesophageal reflux disease 07/15/2021   Uterine leiomyoma 04/19/2020   Genetic testing 04/19/2020   Family history of ovarian cancer 04/03/2020   Family history of leukemia 04/03/2020   Family history of breast cancer 04/03/2020   Malignant neoplasm of upper-outer quadrant of right breast in female, estrogen receptor negative (HCC) 03/28/2020   Overweight (BMI 25.0-29.9) 12/05/2019  Essential hypertension, benign 05/29/2018   Iritis of left eye 05/29/2018   Iron deficiency anemia due to chronic blood loss 05/29/2018   Primary hypothyroidism 05/29/2018    PCP: Catheryn Slocumb, MD  REFERRING PROVIDER: Lonell Sprang, DO  REFERRING DIAG: [phone] (ICD-10-CM) - Chronic pain of right knee M17.11 (ICD-10-CM) - Unilateral primary osteoarthritis, right knee  THERAPY DIAG:  Chronic pain of right knee  Stiffness of right knee, not elsewhere classified  Aftercare following surgery for neoplasm  Rationale for  Evaluation and Treatment: Rehabilitation  ONSET DATE: chronic  SUBJECTIVE:   SUBJECTIVE STATEMENT:   Up for anything today. Knee is really doing great but it can be hard to bend at times. Not really having any pain. Did leg extensions at the gym yesterday and did spin this morning.   PERTINENT HISTORY: + R breast CA Last knee surgery 15+ years ago  PAIN:   Are you having pain? Yes: NPRS scale: 0/10 this morning  Pain location: Medial right knee Pain description: pinch/sharp Aggravating factors: jumping, end range flexion, squatting Relieving factors: rest, exercises  PRECAUTIONS: None  RED FLAGS: None   WEIGHT BEARING RESTRICTIONS: No  FALLS:  Has patient fallen in last 6 months? No and Yes. Number of falls 1in a run  LIVING ENVIRONMENT: Lives with: lives with their spouse Lives in: House/apartment Stairs: Yes: Internal: 12 steps; on right going up Has following equipment at home: None  OCCUPATION: negotiator  PLOF: Independent  PATIENT GOALS: Softball. Pickleball, sew, paint, spinning class  NEXT MD VISIT: 06/14/24  OBJECTIVE:  Note: Objective measures were completed at Evaluation unless otherwise noted.  DIAGNOSTIC FINDINGS: none listed  PATIENT SURVEYS:  PSFS: THE PATIENT SPECIFIC FUNCTIONAL SCALE  Place score of 0-10 (0 = unable to perform activity and 10 = able to perform activity at the same level as before injury or problem)  Activity Date: 04/25/2024 05/09/24   Descending stairs 7 7   2.   squatting 7 4   3.   in/out car 7 7   4.      Total Score 7 6     Total Score = Sum of activity scores/number of activities  Minimally Detectable Change: 3 points (for single activity); 2 points (for average score)  [facility] (nd). The Patient Specific Functional Scale . Retrieved from [email or web address]   COGNITION: Overall cognitive status: Within functional limits for tasks  assessed     SENSATION: WFL  EDEMA:  Not measured  MUSCLE LENGTH: Hamstrings: Right mild restriction   POSTURE: L calcaneus valgus, mild L  genu varus, R knee flexed in standing,   PALPATION: 1+ at lateral jt line  LOWER EXTREMITY ROM:  Active/Passive ROM Right Eval 04/26/2024 Rt / Left Eval 05/09/2024 Left/Right 05/26/2024  Hip flexion 95 A    Hip extension     Hip abduction 40 A    Hip adduction     Hip internal rotation     Hip external rotation     Knee flexion 110A/115 AAROM 118 / 132 140/120  Knee extension +8 to 0  +5/-1  Ankle dorsiflexion     Ankle plantarflexion     Ankle inversion     Ankle eversion      (Blank rows = not tested)  LOWER EXTREMITY MMT:  MMT Right Eval 04/26/2024 Left Eval 04/25/2024 Rt  05/09/24 Left/Right 05/26/2024  Hip flexion 5  5   Hip extension      Hip abduction 5-  5  Hip adduction 5-  5   Hip internal rotation      Hip external rotation      Knee flexion 5-  5-   Knee extension 4+  5 99.1/98.2  Ankle dorsiflexion      Ankle plantarflexion      Ankle inversion      Ankle eversion       (Blank rows = not tested)  LOWER EXTREMITY SPECIAL TESTS:  Knee special tests: Anterior drawer test: negative, McMurray's test: negative, and Bounce neg  FUNCTIONAL TESTS:  5 times sit to stand: 10 sec  GAIT: Distance walked: Medical Sales Representative device utilized: None Level of assistance: Complete Independence Comments: ---  ===========================================                                                                                                                      TREATMENT DATE   05/30/24   Nustep L5x8 minutes seat 10 BLEs only  Shuttle BLE press 100# x15, 112# x8  Shuttle single leg press 56# x15 B, 62# x8 B   R quad/hip flexor stretch 3x30 seconds with strap Single leg bridge 2x10 B progressive knee flexion each round  Sidelying hip ABD + small forward and backward circles 2x10 B  Tandem walks blue  foam forward x3 laps // bars  Side steps over cones while on blue foam x3 laps // bars    05/26/2024 Recumbent bike Seat 7 5 minutes Level 5-9 :50 12 + MPH and sprint the last :10 (max 27.5 MPH) Prone quadriceps stretch 4 x 20 seconds Seated straight leg raises 3 sets of 10 with 3#  Functional Activities (to help with hip stabilization for running/jumping activities): Hip hike and push 2 sets of 5 for 5 seconds each side  Neuromuscular re-education: Verbally reviewed Tandem balance and Single-leg balance eyes open; head turning and eyes closed    05/24/24 ThereEx:  Rec bike level 5   7 min TherActivites:  Double Leg Press: 100# 3 x 10  Single Leg Press: 56# 2 x 10 bil LE Step downs on 4 inch step 2 x 10 leading with heel of her left LE c finger touch support in parallel bars Lateral step downs 4 inch step 2 x 10 c UE support Sit to stand: focusing on Rt quad strengthening with Rt LE positioned back and Left heel on floor out front 2x8  5#KB 17  chair height Neuro: UUDD on Air ex 20x2 Tandem on Airex  4 x30 sec   05/09/24 ThereEx:  Sci bike: Level 4 x 6 minutes  ROM and MMT updated TherActivites:  Double Leg Press: 87# 2 x 10  Single Leg Press: 50# 2 x 10 bil LE Step downs on 4 inch step 2 x 10 leading with heel of her left LE c finger touch support in parallel bars Lateral step downs 4 inch step 2 x 10 c UE support Sit to stand: focusing on Rt quad strengthening with Rt LE positioned back and  Left heel on floor out front x 10 ( pt instructed to work on preventing her Rt hip from IR)   PATIENT EDUCATION:  Education details: HEP Person educated: Patient Education method: Programmer, Multimedia, Facilities Manager, Actor cues, Verbal cues, and Handouts Education comprehension: verbalized understanding, returned demonstration, and verbal cues required  HOME EXERCISE PROGRAM: Access Code: [email or web address] URL: [email or web address] Date: 05/26/2024 Prepared by: Lamar Ivory  Exercises - Supine Quadriceps Stretch with Strap on Table  - 2 x daily - 7 x weekly - 1 sets - 4-5 reps - 10-15 seconds (longer is ok) hold - Seated Straight Leg Raise   - 1 x daily - 5-7 x weekly - 5 sets - 10 reps - 2 seconds hold - Tandem Stance  - 1 x daily - 3-7 x weekly - 1 sets - 10 reps - 20 second hold - Single Leg Stance  - 1 x daily - 3-7 x weekly - 1 sets - 10 reps - 10 seconds hold - Standing Hip Hiking  - 2 x daily - 7 x weekly - 2 sets - 5 reps - 5 seconds hold  ASSESSMENT:  CLINICAL IMPRESSION:  Arrives today doing well, main complaint is just some ongoing stiffness with knee flexion. Has been active in the gym. Continued working on functional strengthening and activities into end range flexion however per last measurement, her ROM is really looking good. Will continue to challenge as appropriate.      OBJECTIVE IMPAIRMENTS: decreased ROM, decreased strength, impaired flexibility, and pain.   ACTIVITY LIMITATIONS: sporting  PARTICIPATION LIMITATIONS: gym activities sometimes affected  PERSONAL FACTORS: 1-2 comorbidities: CA, htn are also affecting patient's functional outcome.   REHAB POTENTIAL: Good  CLINICAL DECISION MAKING: Stable/uncomplicated  EVALUATION COMPLEXITY: Low   GOALS: Goals reviewed with patient? Yes  SHORT TERM GOALS: Target date: 05/18/2024   Patient will show compliance with initial HEP. Baseline: Goal status: Met 04/28/2024  2.  Patient will report pain level to decrease by 1 level.  Baseline:  Goal status: Met 05/09/24    LONG TERM GOALS: Target date: 06/22/2024  8 weeks    Patient will be independent with final HEP in order to maintain and progress upon functional gains made within PT. Baseline:  Goal status: INITIAL   2.  Patient will report pain levels no greater than 2/10 in order to show an improved overall quality of life. Baseline:  Goal status: Partially met 05/26/2024  3.  Patient will increase PSFS to at least  3 points in order to show a significantly improved subjective disability rating. Baseline:  Goal status: INITIAL  4.  Increase Active extension by 4 or more degrees and flexion by 5 degrees. Baseline:  Goal status: INITIAL  5.  Increase strength of R LE by 1/2 grade to 1 full grade in R LE.   Baseline:  Goal status: INITIAL     PLAN:  PT FREQUENCY: 1-2x/week  PT DURATION: 8 weeks  PLANNED INTERVENTIONS: 97164- PT Re-evaluation, 97750- Physical Performance Testing, 97110-Therapeutic exercises, 97530- Therapeutic activity, [id number]- Neuromuscular re-education, 97535- Self Care, [id number]- Manual therapy, [id number]- Gait training, [phone]- Orthotic Initial, [phone]- Orthotic/Prosthetic subsequent, [phone]- Canalith repositioning, [phone]- Aquatic Therapy, [phone]- Electrical stimulation (unattended), [phone]- Electrical stimulation (manual), [id number]- Vasopneumatic device, [id number]- Ultrasound, [id number]- Traction (mechanical), [id number]- Ionotophoresis 4mg /ml Dexamethasone , [id number] (1-2 muscles), [id number] (3+ muscles)- Dry Needling, Patient/Family education, Balance training, Stair training, Taping, Joint mobilization, Joint manipulation, Spinal manipulation, Spinal mobilization, Vestibular training, DME instructions, Cryotherapy, and Moist heat  PLAN FOR NEXT SESSION:  Continue with quad strengthening emphasis and stairs. High level balance   Josette Rough, PT, DPT 05/30/2024 8:42 AM     "

## 2024-05-31 NOTE — Therapy (Signed)
 " OUTPATIENT PHYSICAL THERAPY LOWER EXTREMITY TREATMENT   Patient Name: Shannon Kane MRN: [id number] DOB:12-13-1970, 54 y.o., female Today's Date: 06/02/2024  END OF SESSION:  PT End of Session - 06/01/24 1508     Visit Number 8    Number of Visits 17    Date for Recertification  06/22/24    PT Start Time 1430    PT Stop Time 1510    PT Time Calculation (min) 40 min    Activity Tolerance Patient tolerated treatment well    Behavior During Therapy [facility] for tasks assessed/performed                Past Medical History:  Diagnosis Date   Abnormal glucose    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 Lynwood Nasuti   Hypertension    Hyperthyroidism    in the past   Hypothyroidism    Iron deficiency anemia    Palpitation    Palpitations 10/15/2023   Personal history of chemotherapy    Personal history of radiation therapy    Spontaneous ecchymoses    Tachycardia    Vitamin D  deficiency    Past Surgical History:  Procedure Laterality Date   BREAST BIOPSY     BREAST EXCISIONAL BIOPSY     BREAST LUMPECTOMY Right 08/20/2020   w/ rad w/ chemo   BREAST LUMPECTOMY WITH RADIOACTIVE SEED LOCALIZATION Right 08/20/2020   Procedure: RADIOACTIVE SEED GUIDED RIGHT BREAST LUMPECTOMY;  Surgeon: Vernetta Berg, MD;  Location: [facility] OR;  Service: General;  Laterality: Right;   CHOLECYSTECTOMY     COLONOSCOPY  05/18/2018   KNEE SURGERY     PORT-A-CATH REMOVAL N/A 04/30/2021   Procedure: REMOVAL PORT-A-CATH;  Surgeon: Vernetta Berg, MD;  Location: [facility];  Service: General;  Laterality: N/A;   PORTACATH PLACEMENT Left 04/15/2020   Procedure: INSERTION PORT-A-CATH;  Surgeon: Vernetta Berg, MD;  Location: [facility];  Service: General;  Laterality: Left;   RADIOACTIVE SEED GUIDED  AXILLARY SENTINEL LYMPH NODE Right 08/20/2020   Procedure: RADIOACTIVE SEED TARGETED RIGHT AXILLARY LYMPH NODE DISSECTION;  Surgeon: Vernetta Berg, MD;  Location: [facility] OR;  Service: General;  Laterality: Right;   TONSILLECTOMY     Patient Active Problem List   Diagnosis Date Noted   Rash and nonspecific skin eruption 05/03/2024   Hematuria, microscopic 05/03/2024   Allergic contact dermatitis 05/03/2024   Leukocytes in urine 05/03/2024   Counseling about travel 02/08/2024   Acute swimmer's ear of left side 02/08/2024   Class 1 obesity due to excess calories with serious comorbidity and body mass index (BMI) of 30.0 to 30.9 in adult 02/07/2024   Chronic pain of right knee 02/07/2024   Palpitations 10/15/2023   Encounter for general adult medical examination w/o abnormal findings 08/03/2023   Other abnormal glucose 01/20/2023   Trigger middle finger of left hand 01/20/2023   Constipation 07/15/2021   Chronic idiopathic constipation 07/15/2021   Dysphagia 07/15/2021   Gastroesophageal reflux disease 07/15/2021   Uterine leiomyoma 04/19/2020   Genetic testing 04/19/2020   Family history of ovarian cancer 04/03/2020   Family history of leukemia 04/03/2020   Family history of breast cancer 04/03/2020   Malignant neoplasm of upper-outer quadrant of right breast in female, estrogen receptor negative (HCC) 03/28/2020   Overweight (BMI 25.0-29.9) 12/05/2019  Essential hypertension, benign 05/29/2018   Iritis of left eye 05/29/2018   Iron deficiency anemia due to chronic blood loss 05/29/2018   Primary hypothyroidism 05/29/2018    PCP: Catheryn Slocumb, MD  REFERRING PROVIDER: Lonell Sprang, DO  REFERRING DIAG: [phone] (ICD-10-CM) - Chronic pain of right knee M17.11 (ICD-10-CM) - Unilateral primary osteoarthritis, right knee  THERAPY DIAG:  Chronic pain of right knee  Stiffness of right knee, not elsewhere classified  Aftercare following surgery for neoplasm  Rationale for  Evaluation and Treatment: Rehabilitation  ONSET DATE: chronic  SUBJECTIVE:   SUBJECTIVE STATEMENT:  Pt c/o pinching in the R buttucks.  PERTINENT HISTORY: + R breast CA Last knee surgery 15+ years ago  PAIN:   Are you having pain? Yes: NPRS scale: knee 0/10   Pain location: Medial right knee Pain description: pinch/sharp Aggravating factors: jumping, end range flexion, squatting Relieving factors: rest, exercises  PRECAUTIONS: None  RED FLAGS: None   WEIGHT BEARING RESTRICTIONS: No  FALLS:  Has patient fallen in last 6 months? No and Yes. Number of falls 1in a run  LIVING ENVIRONMENT: Lives with: lives with their spouse Lives in: House/apartment Stairs: Yes: Internal: 12 steps; on right going up Has following equipment at home: None  OCCUPATION: negotiator  PLOF: Independent  PATIENT GOALS: Softball. Pickleball, sew, paint, spinning class  NEXT MD VISIT: 06/14/24  OBJECTIVE:  Note: Objective measures were completed at Evaluation unless otherwise noted.  DIAGNOSTIC FINDINGS: none listed  PATIENT SURVEYS:  PSFS: THE PATIENT SPECIFIC FUNCTIONAL SCALE  Place score of 0-10 (0 = unable to perform activity and 10 = able to perform activity at the same level as before injury or problem)  Activity Date: 04/25/2024 05/09/24 06/01/24  Descending stairs 7 7 9   2.   squatting 7 4 6   3.   in/out car 7 7 9   4.      Total Score 7 6 8     Total Score = Sum of activity scores/number of activities  Minimally Detectable Change: 3 points (for single activity); 2 points (for average score)  [facility] (nd). The Patient Specific Functional Scale . Retrieved from [email or web address]   COGNITION: Overall cognitive status: Within functional limits for tasks assessed     SENSATION: WFL  EDEMA:  Not measured  MUSCLE LENGTH: Hamstrings: Right mild restriction   POSTURE: L calcaneus valgus, mild L  genu  varus, R knee flexed in standing,   PALPATION: 1+ at lateral jt line  LOWER EXTREMITY ROM:  Active/Passive ROM Right Eval 04/26/2024 Rt / Left Eval 05/09/2024 Left/Right 05/26/2024  Hip flexion 95 A    Hip extension     Hip abduction 40 A    Hip adduction     Hip internal rotation     Hip external rotation     Knee flexion 110A/115 AAROM 118 / 132 140/120  Knee extension +8 to 0  +5/-1  Ankle dorsiflexion     Ankle plantarflexion     Ankle inversion     Ankle eversion      (Blank rows = not tested)  LOWER EXTREMITY MMT:  MMT Right Eval 04/26/2024 Left Eval 04/25/2024 Rt  05/09/24 Left/Right 05/26/2024  Hip flexion 5  5   Hip extension      Hip abduction 5-  5   Hip adduction 5-  5   Hip internal rotation      Hip external rotation      Knee flexion 5-  5-  Knee extension 4+  5 99.1/98.2  Ankle dorsiflexion      Ankle plantarflexion      Ankle inversion      Ankle eversion       (Blank rows = not tested)  LOWER EXTREMITY SPECIAL TESTS:  Knee special tests: Anterior drawer test: negative, McMurray's test: negative, and Bounce neg  FUNCTIONAL TESTS:  5 times sit to stand: 10 sec  GAIT: Distance walked: Medical Sales Representative device utilized: None Level of assistance: Complete Independence Comments: ---  ===========================================                                                                                                                      TREATMENT DATE 06/01/24 Rec Bike level 3 6 min Leg press 112# 3x10  Single  #56 2x10  Tandem walks blue foam forward x3 laps // bars  and 2 laps sideways Squats on Air ex 2x10  Hamstring and piriformis stretching bilaterally   05/30/24   Nustep L5x8 minutes seat 10 BLEs only  Shuttle BLE press 100# x15, 112# x8  Shuttle single leg press 56# x15 B, 62# x8 B   R quad/hip flexor stretch 3x30 seconds with strap Single leg bridge 2x10 B progressive knee flexion each round  Sidelying hip ABD +  small forward and backward circles 2x10 B  Tandem walks blue foam forward x3 laps // bars  Side steps over cones while on blue foam x3 laps // bars    05/26/2024 Recumbent bike Seat 7 5 minutes Level 5-9 :50 12 + MPH and sprint the last :10 (max 27.5 MPH) Prone quadriceps stretch 4 x 20 seconds Seated straight leg raises 3 sets of 10 with 3#  Functional Activities (to help with hip stabilization for running/jumping activities): Hip hike and push 2 sets of 5 for 5 seconds each side  Neuromuscular re-education: Verbally reviewed Tandem balance and Single-leg balance eyes open; head turning and eyes closed    05/24/24 ThereEx:  Rec bike level 5   7 min TherActivites:  Double Leg Press: 100# 3 x 10  Single Leg Press: 56# 2 x 10 bil LE Step downs on 4 inch step 2 x 10 leading with heel of her left LE c finger touch support in parallel bars Lateral step downs 4 inch step 2 x 10 c UE support Sit to stand: focusing on Rt quad strengthening with Rt LE positioned back and Left heel on floor out front 2x8  5#KB 17  chair height Neuro: UUDD on Air ex 20x2 Tandem on Airex  4 x30 sec   05/09/24 ThereEx:  Sci bike: Level 4 x 6 minutes  ROM and MMT updated TherActivites:  Double Leg Press: 87# 2 x 10  Single Leg Press: 50# 2 x 10 bil LE Step downs on 4 inch step 2 x 10 leading with heel of her left LE c finger touch support in parallel bars Lateral step downs 4 inch step 2 x 10 c UE support Sit to  stand: focusing on Rt quad strengthening with Rt LE positioned back and Left heel on floor out front x 10 ( pt instructed to work on preventing her Rt hip from IR)   PATIENT EDUCATION:  Education details: HEP Person educated: Patient Education method: Programmer, Multimedia, Facilities Manager, Actor cues, Verbal cues, and Handouts Education comprehension: verbalized understanding, returned demonstration, and verbal cues required  HOME EXERCISE PROGRAM: Access Code: [email or web address] URL:  [email or web address] Date: 05/26/2024 Prepared by: Lamar Ivory  Exercises - Supine Quadriceps Stretch with Strap on Table  - 2 x daily - 7 x weekly - 1 sets - 4-5 reps - 10-15 seconds (longer is ok) hold - Seated Straight Leg Raise   - 1 x daily - 5-7 x weekly - 5 sets - 10 reps - 2 seconds hold - Tandem Stance  - 1 x daily - 3-7 x weekly - 1 sets - 10 reps - 20 second hold - Single Leg Stance  - 1 x daily - 3-7 x weekly - 1 sets - 10 reps - 10 seconds hold - Standing Hip Hiking  - 2 x daily - 7 x weekly - 2 sets - 5 reps - 5 seconds hold  ASSESSMENT:  CLINICAL IMPRESSION: Able to tolerate exercise protocol today and reported relief from stretching.  Needed some VC for alignment with squats and steps.   OBJECTIVE IMPAIRMENTS: decreased ROM, decreased strength, impaired flexibility, and pain.   ACTIVITY LIMITATIONS: sporting  PARTICIPATION LIMITATIONS: gym activities sometimes affected  PERSONAL FACTORS: 1-2 comorbidities: CA, htn are also affecting patient's functional outcome.   REHAB POTENTIAL: Good  CLINICAL DECISION MAKING: Stable/uncomplicated  EVALUATION COMPLEXITY: Low   GOALS: Goals reviewed with patient? Yes  SHORT TERM GOALS: Target date: 05/18/2024   Patient will show compliance with initial HEP. Baseline: Goal status: Met 04/28/2024  2.  Patient will report pain level to decrease by 1 level.  Baseline:  Goal status: Met 05/09/24    LONG TERM GOALS: Target date: 06/22/2024  8 weeks    Patient will be independent with final HEP in order to maintain and progress upon functional gains made within PT. Baseline:  Goal status:MET 06/03/23   2.  Patient will report pain levels no greater than 2/10 in order to show an improved overall quality of life. Baseline:  Goal status: Partially met 05/26/2024  3.  Patient will increase PSFS to at least 3 points in order to show a significantly improved subjective disability rating. Baseline:  Goal  status: At 8 points on 06/01/24  4.  Increase Active extension by 4 or more degrees and flexion by 5 degrees. Baseline:  Goal status: INITIAL  5.  Increase strength of R LE by 1/2 grade to 1 full grade in R LE.   Baseline:  Goal status: INITIAL     PLAN:  PT FREQUENCY: 1-2x/week  PT DURATION: 8 weeks  PLANNED INTERVENTIONS: 97164- PT Re-evaluation, 97750- Physical Performance Testing, 97110-Therapeutic exercises, 97530- Therapeutic activity, [id number]- Neuromuscular re-education, 97535- Self Care, [id number]- Manual therapy, [id number]- Gait training, [phone]- Orthotic Initial, [phone]- Orthotic/Prosthetic subsequent, [phone]- Canalith repositioning, [id number]- Aquatic Therapy, H9716- Electrical stimulation (unattended), [phone]- Electrical stimulation (manual), [id number]- Vasopneumatic device, [id number]- Ultrasound, [id number]- Traction (mechanical), [id number]- Ionotophoresis 4mg /ml Dexamethasone , [id number] (1-2 muscles), [id number] (3+ muscles)- Dry Needling, Patient/Family education, Balance training, Stair training, Taping, Joint mobilization, Joint manipulation, Spinal manipulation, Spinal mobilization, Vestibular training, DME instructions, Cryotherapy, and Moist heat  PLAN FOR NEXT SESSION: Continue with quad strengthening emphasis and stairs. High level balance    Burnard  Coyle Stordahl, PT 06/02/24  7:49 AM      "

## 2024-06-01 ENCOUNTER — Ambulatory Visit

## 2024-06-01 DIAGNOSIS — M25561 Pain in right knee: Secondary | ICD-10-CM | POA: Diagnosis not present

## 2024-06-01 DIAGNOSIS — M25661 Stiffness of right knee, not elsewhere classified: Secondary | ICD-10-CM | POA: Diagnosis not present

## 2024-06-01 DIAGNOSIS — G8929 Other chronic pain: Secondary | ICD-10-CM | POA: Diagnosis not present

## 2024-06-01 DIAGNOSIS — Z483 Aftercare following surgery for neoplasm: Secondary | ICD-10-CM

## 2024-06-06 ENCOUNTER — Ambulatory Visit: Admitting: Physical Therapy

## 2024-06-06 ENCOUNTER — Encounter: Payer: Self-pay | Admitting: Physical Therapy

## 2024-06-06 DIAGNOSIS — M25561 Pain in right knee: Secondary | ICD-10-CM | POA: Diagnosis not present

## 2024-06-06 DIAGNOSIS — G8929 Other chronic pain: Secondary | ICD-10-CM | POA: Diagnosis not present

## 2024-06-06 DIAGNOSIS — M25661 Stiffness of right knee, not elsewhere classified: Secondary | ICD-10-CM | POA: Diagnosis not present

## 2024-06-06 DIAGNOSIS — Z483 Aftercare following surgery for neoplasm: Secondary | ICD-10-CM | POA: Diagnosis not present

## 2024-06-06 NOTE — Therapy (Signed)
 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 Lynwood Nasuti   Hypertension    Hyperthyroidism    in the past   Hypothyroidism    Iron deficiency anemia    Palpitation    Palpitations 10/15/2023   Personal history of chemotherapy    Personal history of radiation therapy    Spontaneous ecchymoses    Tachycardia    Vitamin D  deficiency    Past Surgical History:  Procedure Laterality Date   BREAST BIOPSY     BREAST EXCISIONAL BIOPSY     BREAST LUMPECTOMY Right 08/20/2020   w/ rad w/ chemo   BREAST LUMPECTOMY WITH RADIOACTIVE SEED LOCALIZATION Right 08/20/2020   Procedure: RADIOACTIVE SEED GUIDED RIGHT BREAST LUMPECTOMY;  Surgeon: Vernetta Berg, MD;  Location: [facility];  Service: General;  Laterality: Right;   CHOLECYSTECTOMY     COLONOSCOPY  05/18/2018   KNEE SURGERY     PORT-A-CATH REMOVAL N/A 04/30/2021   Procedure: REMOVAL PORT-A-CATH;  Surgeon: Vernetta Berg, MD;  Location: [facility];  Service: General;  Laterality: N/A;   PORTACATH PLACEMENT Left 04/15/2020   Procedure: INSERTION PORT-A-CATH;  Surgeon: Vernetta Berg, MD;  Location: [facility];  Service: General;  Laterality: Left;   RADIOACTIVE SEED  GUIDED AXILLARY SENTINEL LYMPH NODE Right 08/20/2020   Procedure: RADIOACTIVE SEED TARGETED RIGHT AXILLARY LYMPH NODE DISSECTION;  Surgeon: Vernetta Berg, MD;  Location: [facility];  Service: General;  Laterality: Right;   TONSILLECTOMY     Patient Active Problem List   Diagnosis Date Noted   Rash and nonspecific skin eruption 05/03/2024   Hematuria, microscopic 05/03/2024   Allergic contact dermatitis 05/03/2024   Leukocytes in urine 05/03/2024   Counseling about travel 02/08/2024   Acute swimmer's ear of left side 02/08/2024   Class 1 obesity due to excess calories with serious comorbidity and body mass index (BMI) of 30.0 to 30.9 in adult 02/07/2024   Chronic pain of right knee 02/07/2024   Palpitations 10/15/2023   Encounter for general adult medical examination w/o abnormal findings 08/03/2023   Other abnormal glucose 01/20/2023   Trigger middle finger of left hand 01/20/2023   Constipation 07/15/2021   Chronic idiopathic constipation 07/15/2021   Dysphagia 07/15/2021   Gastroesophageal reflux disease 07/15/2021   Uterine leiomyoma 04/19/2020   Genetic testing 04/19/2020   Family history of ovarian cancer 04/03/2020   Family history of leukemia 04/03/2020   Family history of breast cancer 04/03/2020   Malignant neoplasm of upper-outer quadrant of right breast in female, estrogen receptor negative (HCC) 03/28/2020   Overweight (BMI 25.0-29.9)  12/05/2019   Essential hypertension, benign 05/29/2018   Iritis of left eye 05/29/2018   Iron deficiency anemia due to chronic blood loss 05/29/2018   Primary hypothyroidism 05/29/2018    PCP: Catheryn Slocumb, MD  REFERRING PROVIDER: Lonell Sprang, DO  REFERRING DIAG: [phone] (ICD-10-CM) - Chronic pain of right knee M17.11 (ICD-10-CM) - Unilateral primary osteoarthritis, right knee  THERAPY DIAG:  Chronic pain of right knee  Stiffness of right knee, not elsewhere classified  Aftercare following surgery for  neoplasm  Rationale for Evaluation and Treatment: Rehabilitation  ONSET DATE: chronic  SUBJECTIVE:   SUBJECTIVE STATEMENT:  Pt arriving today reporting Rt gluteal pain, and feels like her sciatica is acting up.   PERTINENT HISTORY: + R breast CA Last knee surgery 15+ years ago  PAIN:   Are you having pain? Yes: NPRS scale: knee 0/10, Rt glute 6-8/10 at times  Pain location: Medial right knee Pain description: pinch/sharp Aggravating factors: jumping, end range flexion, squatting Relieving factors: rest, exercises  PRECAUTIONS: None  RED FLAGS: None   WEIGHT BEARING RESTRICTIONS: No  FALLS:  Has patient fallen in last 6 months? No and Yes. Number of falls 1in a run  LIVING ENVIRONMENT: Lives with: lives with their spouse Lives in: House/apartment Stairs: Yes: Internal: 12 steps; on right going up Has following equipment at home: None  OCCUPATION: negotiator  PLOF: Independent  PATIENT GOALS: Softball. Pickleball, sew, paint, spinning class  NEXT MD VISIT: 06/14/24  OBJECTIVE:  Note: Objective measures were completed at Evaluation unless otherwise noted.  DIAGNOSTIC FINDINGS: none listed  PATIENT SURVEYS:  PSFS: THE PATIENT SPECIFIC FUNCTIONAL SCALE  Place score of 0-10 (0 = unable to perform activity and 10 = able to perform activity at the same level as before injury or problem)  Activity Date: 04/25/2024 05/09/24 06/01/24  Descending stairs 7 7 9   2.   squatting 7 4 6   3.   in/out car 7 7 9   4.      Total Score 7 6 8     Total Score = Sum of activity scores/number of activities  Minimally Detectable Change: 3 points (for single activity); 2 points (for average score)  [facility] (nd). The Patient Specific Functional Scale . Retrieved from [email or web address]   COGNITION: Overall cognitive status: Within functional limits for tasks assessed     SENSATION: WFL  EDEMA:  Not  measured  MUSCLE LENGTH: Hamstrings: Right mild restriction   POSTURE: L calcaneus valgus, mild L  genu varus, R knee flexed in standing,   PALPATION: 1+ at lateral jt line  LOWER EXTREMITY ROM:  Active/Passive ROM Right Eval 04/26/2024 Rt / Left Eval 05/09/2024 Left/Right 05/26/2024 Rt 06/06/24  Hip flexion 95 A     Hip extension      Hip abduction 40 A     Hip adduction      Hip internal rotation      Hip external rotation      Knee flexion 110A/115 AAROM 118 / 132 140/120 120  Knee extension +8 to 0  +5/-1 -2  Ankle dorsiflexion      Ankle plantarflexion      Ankle inversion      Ankle eversion       (Blank rows = not tested)  LOWER EXTREMITY MMT:  MMT Right Eval 04/26/2024 Left Eval 04/25/2024 Rt  05/09/24 Left/Right 05/26/2024  Hip flexion 5  5   Hip extension      Hip abduction 5-  5  Hip adduction 5-  5   Hip internal rotation      Hip external rotation      Knee flexion 5-  5-   Knee extension 4+  5 99.1/98.2  Ankle dorsiflexion      Ankle plantarflexion      Ankle inversion      Ankle eversion       (Blank rows = not tested)  LOWER EXTREMITY SPECIAL TESTS:  Knee special tests: Anterior drawer test: negative, McMurray's test: negative, and Bounce neg  FUNCTIONAL TESTS:  5 times sit to stand: 10 sec  GAIT: Distance walked: Medical Sales Representative device utilized: None Level of assistance: Complete Independence Comments: ---  ===========================================                                                                                                                      06/06/24 ThereEx:  Recumbent bike: level 3 x 5 minutes Supine gluteal stretch x 3 holding 20 sec Supine piriformis stretch x 3 holding 20 sec Supine trunk rotation: x 3 bil holding 20 sec Standing hip flexor stretch with added low back stretch. TherActivites:  Double Leg Press: 87# 2 x 10  Single Leg Press: 50# 2 x 10 bil LE Sit to stand: 2 x 10 c verbal  instruction to distribute weight equally between LE, attempted with green TB around knees to encourage Rt glute medius activation Manual:  Percussion to Rt glutes and rt piriformis     TREATMENT DATE 06/01/24 Rec Bike level 3 6 min Leg press 112# 3x10  Single  #56 2x10  Tandem walks blue foam forward x3 laps // bars  and 2 laps sideways Squats on Air ex 2x10  Hamstring and piriformis stretching bilaterally   05/30/24   Nustep L5x8 minutes seat 10 BLEs only  Shuttle BLE press 100# x15, 112# x8  Shuttle single leg press 56# x15 B, 62# x8 B   R quad/hip flexor stretch 3x30 seconds with strap Single leg bridge 2x10 B progressive knee flexion each round  Sidelying hip ABD + small forward and backward circles 2x10 B  Tandem walks blue foam forward x3 laps // bars  Side steps over cones while on blue foam x3 laps // bars    05/26/2024 Recumbent bike Seat 7 5 minutes Level 5-9 :50 12 + MPH and sprint the last :10 (max 27.5 MPH) Prone quadriceps stretch 4 x 20 seconds Seated straight leg raises 3 sets of 10 with 3#  Functional Activities (to help with hip stabilization for running/jumping activities): Hip hike and push 2 sets of 5 for 5 seconds each side  Neuromuscular re-education: Verbally reviewed Tandem balance and Single-leg balance eyes open; head turning and eyes closed    05/24/24 ThereEx:  Rec bike level 5   7 min TherActivites:  Double Leg Press: 100# 3 x 10  Single Leg Press: 56# 2 x 10 bil LE Step downs on 4 inch step 2 x 10 leading with heel of her left  LE c finger touch support in parallel bars Lateral step downs 4 inch step 2 x 10 c UE support Sit to stand: focusing on Rt quad strengthening with Rt LE positioned back and Left heel on floor out front 2x8  5#KB 17  chair height Neuro: UUDD on Air ex 20x2 Tandem on Airex  4 x30 sec   05/09/24 ThereEx:  Sci bike: Level 4 x 6 minutes  ROM and MMT updated TherActivites:  Double Leg Press: 87# 2 x 10  Single  Leg Press: 50# 2 x 10 bil LE Step downs on 4 inch step 2 x 10 leading with heel of her left LE c finger touch support in parallel bars Lateral step downs 4 inch step 2 x 10 c UE support Sit to stand: focusing on Rt quad strengthening with Rt LE positioned back and Left heel on floor out front x 10 ( pt instructed to work on preventing her Rt hip from IR)   PATIENT EDUCATION:  Education details: HEP Person educated: Patient Education method: Programmer, Multimedia, Facilities Manager, Actor cues, Verbal cues, and Handouts Education comprehension: verbalized understanding, returned demonstration, and verbal cues required  HOME EXERCISE PROGRAM: Access Code: [email or web address] URL: [email or web address] Date: 05/26/2024 Prepared by: Lamar Ivory  Exercises - Supine Quadriceps Stretch with Strap on Table  - 2 x daily - 7 x weekly - 1 sets - 4-5 reps - 10-15 seconds (longer is ok) hold - Seated Straight Leg Raise   - 1 x daily - 5-7 x weekly - 5 sets - 10 reps - 2 seconds hold - Tandem Stance  - 1 x daily - 3-7 x weekly - 1 sets - 10 reps - 20 second hold - Single Leg Stance  - 1 x daily - 3-7 x weekly - 1 sets - 10 reps - 10 seconds hold - Standing Hip Hiking  - 2 x daily - 7 x weekly - 2 sets - 5 reps - 5 seconds hold  ASSESSMENT:  CLINICAL IMPRESSION: Treatment today focused on LE and lumbar stretching, LE strengthening. Pt c weakness noted in Rt glute medius when performing sit to stand and squats. Pt was issued side lying hip circles for home and pt's HEP was updated. Recommend continued skilled PT interventions.   OBJECTIVE IMPAIRMENTS: decreased ROM, decreased strength, impaired flexibility, and pain.   ACTIVITY LIMITATIONS: sporting  PARTICIPATION LIMITATIONS: gym activities sometimes affected  PERSONAL FACTORS: 1-2 comorbidities: CA, htn are also affecting patient's functional outcome.   REHAB POTENTIAL: Good  CLINICAL DECISION MAKING: Stable/uncomplicated  EVALUATION  COMPLEXITY: Low   GOALS: Goals reviewed with patient? Yes  SHORT TERM GOALS: Target date: 05/18/2024   Patient will show compliance with initial HEP. Baseline: Goal status: Met 04/28/2024  2.  Patient will report pain level to decrease by 1 level.  Baseline:  Goal status: Met 05/09/24    LONG TERM GOALS: Target date: 06/22/2024  8 weeks    Patient will be independent with final HEP in order to maintain and progress upon functional gains made within PT. Baseline:  Goal status:MET 06/03/23   2.  Patient will report pain levels no greater than 2/10 in order to show an improved overall quality of life. Baseline:  Goal status: Partially met 05/26/2024  3.  Patient will increase PSFS to at least 3 points in order to show a significantly improved subjective disability rating. Baseline:  Goal status: At 8 points on 06/01/24  4.  Increase Active extension by 4 or more degrees and flexion  by 5 degrees. Baseline:  Goal status: INITIAL  5.  Increase strength of R LE by 1/2 grade to 1 full grade in R LE.   Baseline:  Goal status: INITIAL     PLAN:  PT FREQUENCY: 1-2x/week  PT DURATION: 8 weeks  PLANNED INTERVENTIONS: [id number]- PT Re-evaluation, 97750- Physical Performance Testing, 97110-Therapeutic exercises, 97530- Therapeutic activity, [id number]- Neuromuscular re-education, 97535- Self Care, [id number]- Manual therapy, [id number]- Gait training, [phone]- Orthotic Initial, [phone]- Orthotic/Prosthetic subsequent, [phone]- Canalith repositioning, [phone]- Aquatic Therapy, [phone]- Electrical stimulation (unattended), [phone]- Electrical stimulation (manual), [id number]- Vasopneumatic device, [id number]- Ultrasound, [id number]- Traction (mechanical), [id number]- Ionotophoresis 4mg /ml Dexamethasone , 79439 (1-2 muscles), [id number] (3+ muscles)- Dry Needling, Patient/Family education, Balance training, Stair training, Taping, Joint mobilization, Joint manipulation, Spinal manipulation, Spinal mobilization, Vestibular training, DME  instructions, Cryotherapy, and Moist heat  PLAN FOR NEXT SESSION: hip rotation strengthening, quad strengthening, dynamic balance   Delon Lunger, PT. MPT 06/06/24 9:44 AM   06/06/24  9:44 AM      "

## 2024-06-09 ENCOUNTER — Ambulatory Visit

## 2024-06-09 DIAGNOSIS — G8929 Other chronic pain: Secondary | ICD-10-CM | POA: Diagnosis not present

## 2024-06-09 DIAGNOSIS — M25661 Stiffness of right knee, not elsewhere classified: Secondary | ICD-10-CM

## 2024-06-09 DIAGNOSIS — M25561 Pain in right knee: Secondary | ICD-10-CM | POA: Diagnosis not present

## 2024-06-09 NOTE — Therapy (Signed)
 " OUTPATIENT PHYSICAL THERAPY LOWER EXTREMITY TREATMENT   Patient Name: Neko Mcgeehan MRN: [id number] DOB:[birth year], 54 y.o., female Today's Date: 06/09/2024  END OF SESSION:  PT End of Session - 06/09/24 0755     Visit Number 10    Number of Visits 17    Date for Recertification  06/22/24    PT Start Time 0802    PT Stop Time 0843    PT Time Calculation (min) 41 min    Activity Tolerance Patient tolerated treatment well    Behavior During Therapy [facility] for tasks assessed/performed            Past Medical History:  Diagnosis Date   Abnormal glucose    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 Lynwood Nasuti   Hypertension    Hyperthyroidism    in the past   Hypothyroidism    Iron deficiency anemia    Palpitation    Palpitations 10/15/2023   Personal history of chemotherapy    Personal history of radiation therapy    Spontaneous ecchymoses    Tachycardia    Vitamin D  deficiency    Past Surgical History:  Procedure Laterality Date   BREAST BIOPSY     BREAST EXCISIONAL BIOPSY     BREAST LUMPECTOMY Right 08/20/2020   w/ rad w/ chemo   BREAST LUMPECTOMY WITH RADIOACTIVE SEED LOCALIZATION Right 08/20/2020   Procedure: RADIOACTIVE SEED GUIDED RIGHT BREAST LUMPECTOMY;  Surgeon: Vernetta Berg, MD;  Location: [facility] OR;  Service: General;  Laterality: Right;   CHOLECYSTECTOMY     COLONOSCOPY  05/18/2018   KNEE SURGERY     PORT-A-CATH REMOVAL N/A 04/30/2021   Procedure: REMOVAL PORT-A-CATH;  Surgeon: Vernetta Berg, MD;  Location: [facility];  Service: General;  Laterality: N/A;   PORTACATH PLACEMENT Left 04/15/2020   Procedure: INSERTION PORT-A-CATH;  Surgeon: Vernetta Berg, MD;  Location: [facility];  Service: General;  Laterality: Left;   RADIOACTIVE SEED GUIDED  AXILLARY SENTINEL LYMPH NODE Right 08/20/2020   Procedure: RADIOACTIVE SEED TARGETED RIGHT AXILLARY LYMPH NODE DISSECTION;  Surgeon: Vernetta Berg, MD;  Location: [facility] OR;  Service: General;  Laterality: Right;   TONSILLECTOMY     Patient Active Problem List   Diagnosis Date Noted   Rash and nonspecific skin eruption 05/03/2024   Hematuria, microscopic 05/03/2024   Allergic contact dermatitis 05/03/2024   Leukocytes in urine 05/03/2024   Counseling about travel 02/08/2024   Acute swimmer's ear of left side 02/08/2024   Class 1 obesity due to excess calories with serious comorbidity and body mass index (BMI) of 30.0 to 30.9 in adult 02/07/2024   Chronic pain of right knee 02/07/2024   Palpitations 10/15/2023   Encounter for general adult medical examination w/o abnormal findings 08/03/2023   Other abnormal glucose 01/20/2023   Trigger middle finger of left hand 01/20/2023   Constipation 07/15/2021   Chronic idiopathic constipation 07/15/2021   Dysphagia 07/15/2021   Gastroesophageal reflux disease 07/15/2021   Uterine leiomyoma 04/19/2020   Genetic testing 04/19/2020   Family history of ovarian cancer 04/03/2020   Family history of leukemia 04/03/2020   Family history of breast cancer 04/03/2020   Malignant neoplasm of upper-outer quadrant of right breast in female, estrogen receptor negative (HCC) 03/28/2020   Overweight (BMI 25.0-29.9) 12/05/2019   Essential hypertension,  benign 05/29/2018   Iritis of left eye 05/29/2018   Iron deficiency anemia due to chronic blood loss 05/29/2018   Primary hypothyroidism 05/29/2018    PCP: Catheryn Slocumb, MD  REFERRING PROVIDER: Lonell Sprang, DO  REFERRING DIAG: [phone] (ICD-10-CM) - Chronic pain of right knee M17.11 (ICD-10-CM) - Unilateral primary osteoarthritis, right knee  THERAPY DIAG:  Chronic pain of right knee  Stiffness of right knee, not elsewhere classified  Rationale for Evaluation and Treatment:  Rehabilitation  ONSET DATE: chronic  SUBJECTIVE:   SUBJECTIVE STATEMENT: Patient reports that she is not having any pain in her knee while walking this morning, but it was aching last night. She also said that her sciatic nerve is bothering her a [place] more this morning.  PERTINENT HISTORY: + R breast CA Last knee surgery 15+ years ago  PAIN:   Are you having pain? Yes: NPRS scale: knee 0/10, Rt glute 6-7/10 a Pain location: Medial right knee Pain description: pinch/sharp Aggravating factors: jumping, end range flexion, squatting Relieving factors: rest, exercises  PRECAUTIONS: None  RED FLAGS: None   WEIGHT BEARING RESTRICTIONS: No  FALLS:  Has patient fallen in last 6 months? No and Yes. Number of falls 1in a run  LIVING ENVIRONMENT: Lives with: lives with their spouse Lives in: House/apartment Stairs: Yes: Internal: 12 steps; on right going up Has following equipment at home: None  OCCUPATION: negotiator  PLOF: Independent  PATIENT GOALS: Softball. Pickleball, sew, paint, spinning class  NEXT MD VISIT: 06/14/24  OBJECTIVE:  Note: Objective measures were completed at Evaluation unless otherwise noted.  DIAGNOSTIC FINDINGS: none listed  PATIENT SURVEYS:  PSFS: THE PATIENT SPECIFIC FUNCTIONAL SCALE  Place score of 0-10 (0 = unable to perform activity and 10 = able to perform activity at the same level as before injury or problem)  Activity Date: 04/25/2024 05/09/24 06/01/24  Descending stairs 7 7 9   2.   squatting 7 4 6   3.   in/out car 7 7 9   4.      Total Score 7 6 8     Total Score = Sum of activity scores/number of activities  Minimally Detectable Change: 3 points (for single activity); 2 points (for average score)  [facility] (nd). The Patient Specific Functional Scale . Retrieved from [email or web address]   COGNITION: Overall cognitive status: Within functional limits for tasks  assessed     SENSATION: WFL  EDEMA:  Not measured  MUSCLE LENGTH: Hamstrings: Right mild restriction   POSTURE: L calcaneus valgus, mild L  genu varus, R knee flexed in standing,   PALPATION: 1+ at lateral jt line  LOWER EXTREMITY ROM:  Active/Passive ROM Right Eval 04/26/2024 Rt / Left Eval 05/09/2024 Left/Right 05/26/2024 Rt 06/06/24  Hip flexion 95 A     Hip extension      Hip abduction 40 A     Hip adduction      Hip internal rotation      Hip external rotation      Knee flexion 110A/115 AAROM 118 / 132 140/120 120  Knee extension +8 to 0  +5/-1 -2  Ankle dorsiflexion      Ankle plantarflexion      Ankle inversion      Ankle eversion       (Blank rows = not tested)  LOWER EXTREMITY MMT:  MMT Right Eval 04/26/2024 Left Eval 04/25/2024 Rt  05/09/24 Left/Right 05/26/2024  Hip flexion 5  5   Hip extension  Hip abduction 5-  5   Hip adduction 5-  5   Hip internal rotation      Hip external rotation      Knee flexion 5-  5-   Knee extension 4+  5 99.1/98.2  Ankle dorsiflexion      Ankle plantarflexion      Ankle inversion      Ankle eversion       (Blank rows = not tested)  LOWER EXTREMITY SPECIAL TESTS:  Knee special tests: Anterior drawer test: negative, McMurray's test: negative, and Bounce neg  FUNCTIONAL TESTS:  5 times sit to stand: 10 sec  GAIT: Distance walked: Medical Sales Representative device utilized: None Level of assistance: Complete Independence Comments: ---  ===========================================                                                                                                                       TREATMENT DATE 06/09/2024 TherEx:  Recumbent bike level 2 for 6 minutes  Tap downs from 4 step 2x10 each side  Lateral tap downs from 4 step 2x each side   TherAct:  Bilat leg press 3x12 with 87# Unilat leg press 2x12 with 50# with bilat LE   Neuro Re-Ed: Seated slump: positive  Seated slump nerve glide 1x10  with 2-3s hold  PT discussed tensioner vs slider   Manual:  IASTM with percussive device to Rt lumbar musculature, Rt glutes, and Rt piriformis  Provided patient with tennis ball for STM at home and had her perform against wall   06/06/24 ThereEx:  Recumbent bike: level 3 x 5 minutes Supine gluteal stretch x 3 holding 20 sec Supine piriformis stretch x 3 holding 20 sec Supine trunk rotation: x 3 bil holding 20 sec Standing hip flexor stretch with added low back stretch. TherActivites:  Double Leg Press: 87# 2 x 10  Single Leg Press: 50# 2 x 10 bil LE Sit to stand: 2 x 10 c verbal instruction to distribute weight equally between LE, attempted with green TB around knees to encourage Rt glute medius activation Manual:  Percussion to Rt glutes and rt piriformis  06/01/24 Rec Bike level 3 6 min Leg press 112# 3x10  Single  #56 2x10  Tandem walks blue foam forward x3 laps // bars  and 2 laps sideways Squats on Air ex 2x10  Hamstring and piriformis stretching bilaterally   05/30/24   Nustep L5x8 minutes seat 10 BLEs only  Shuttle BLE press 100# x15, 112# x8  Shuttle single leg press 56# x15 B, 62# x8 B   R quad/hip flexor stretch 3x30 seconds with strap Single leg bridge 2x10 B progressive knee flexion each round  Sidelying hip ABD + small forward and backward circles 2x10 B  Tandem walks blue foam forward x3 laps // bars  Side steps over cones while on blue foam x3 laps // bars    05/26/2024 Recumbent bike Seat 7 5 minutes Level 5-9 :50 12 + MPH and sprint the last :  10 (max 27.5 MPH) Prone quadriceps stretch 4 x 20 seconds Seated straight leg raises 3 sets of 10 with 3#  Functional Activities (to help with hip stabilization for running/jumping activities): Hip hike and push 2 sets of 5 for 5 seconds each side  Neuromuscular re-education: Verbally reviewed Tandem balance and Single-leg balance eyes open; head turning and eyes closed     PATIENT EDUCATION:  Education  details: HEP Person educated: Patient Education method: Explanation, Demonstration, Tactile cues, Verbal cues, and Handouts Education comprehension: verbalized understanding, returned demonstration, and verbal cues required  HOME EXERCISE PROGRAM: Access Code: [email or web address] URL: [email or web address] Date: 05/26/2024 Prepared by: Lamar Ivory  Exercises - Supine Quadriceps Stretch with Strap on Table  - 2 x daily - 7 x weekly - 1 sets - 4-5 reps - 10-15 seconds (longer is ok) hold - Seated Straight Leg Raise   - 1 x daily - 5-7 x weekly - 5 sets - 10 reps - 2 seconds hold - Tandem Stance  - 1 x daily - 3-7 x weekly - 1 sets - 10 reps - 20 second hold - Single Leg Stance  - 1 x daily - 3-7 x weekly - 1 sets - 10 reps - 10 seconds hold - Standing Hip Hiking  - 2 x daily - 7 x weekly - 2 sets - 5 reps - 5 seconds hold  ASSESSMENT:  CLINICAL IMPRESSION: Patient arrived to session noting increase sciatica like pain, though no complaints of knee pain. Patient tolerated all activities this date with slight increase in glute soreness/pain with strengthening. Patient will continue to benefit from skilled PT and has requested to continue 2x/wk for 4 more weeks with hopes to decrease sciatica pain.    OBJECTIVE IMPAIRMENTS: decreased ROM, decreased strength, impaired flexibility, and pain.   ACTIVITY LIMITATIONS: sporting  PARTICIPATION LIMITATIONS: gym activities sometimes affected  PERSONAL FACTORS: 1-2 comorbidities: CA, htn are also affecting patient's functional outcome.   REHAB POTENTIAL: Good  CLINICAL DECISION MAKING: Stable/uncomplicated  EVALUATION COMPLEXITY: Low   GOALS: Goals reviewed with patient? Yes  SHORT TERM GOALS: Target date: 05/18/2024   Patient will show compliance with initial HEP. Baseline: Goal status: Met 04/28/2024  2.  Patient will report pain level to decrease by 1 level.  Baseline:  Goal status: Met 05/09/24    LONG TERM GOALS: Target  date: 06/22/2024  8 weeks    Patient will be independent with final HEP in order to maintain and progress upon functional gains made within PT. Baseline:  Goal status:MET 06/03/23   2.  Patient will report pain levels no greater than 2/10 in order to show an improved overall quality of life. Baseline:  Goal status: Partially met 05/26/2024  3.  Patient will increase PSFS to at least 3 points in order to show a significantly improved subjective disability rating. Baseline:  Goal status: At 8 points on 06/01/24  4.  Increase Active extension by 4 or more degrees and flexion by 5 degrees. Baseline:  Goal status: INITIAL  5.  Increase strength of R LE by 1/2 grade to 1 full grade in R LE.   Baseline:  Goal status: INITIAL     PLAN:  PT FREQUENCY: 1-2x/week  PT DURATION: 8 weeks  PLANNED INTERVENTIONS: 97164- PT Re-evaluation, 97750- Physical Performance Testing, 97110-Therapeutic exercises, 97530- Therapeutic activity, [id number]- Neuromuscular re-education, 97535- Self Care, 02859- Manual therapy, [id number]- Gait training, [id number]- Orthotic Initial, [id number]- Orthotic/Prosthetic subsequent, [phone]- Canalith repositioning, [id number]- Aquatic Therapy, H9716- Electrical stimulation (unattended), [id number]-  Electrical stimulation (manual), 02983- Vasopneumatic device, [id number]- Ultrasound, [id number]- Traction (mechanical), 02966- Ionotophoresis 4mg /ml Dexamethasone , 79439 (1-2 muscles), 20561 (3+ muscles)- Dry Needling, Patient/Family education, Balance training, Stair training, Taping, Joint mobilization, Joint manipulation, Spinal manipulation, Spinal mobilization, Vestibular training, DME instructions, Cryotherapy, and Moist heat  PLAN FOR NEXT SESSION: hip rotation strengthening, quad strengthening, dynamic balance, dry needling for piriformis (?)    Susannah Daring, PT, DPT 06/09/24 8:55 AM        "

## 2024-06-12 ENCOUNTER — Encounter

## 2024-06-14 ENCOUNTER — Ambulatory Visit: Admitting: Sports Medicine

## 2024-06-14 ENCOUNTER — Other Ambulatory Visit

## 2024-06-14 ENCOUNTER — Encounter: Payer: Self-pay | Admitting: Sports Medicine

## 2024-06-14 DIAGNOSIS — M545 Low back pain, unspecified: Secondary | ICD-10-CM | POA: Diagnosis not present

## 2024-06-14 DIAGNOSIS — M1711 Unilateral primary osteoarthritis, right knee: Secondary | ICD-10-CM

## 2024-06-14 DIAGNOSIS — M7601 Gluteal tendinitis, right hip: Secondary | ICD-10-CM | POA: Diagnosis not present

## 2024-06-14 NOTE — Progress Notes (Signed)
 Patient says that she is doing much better since the PRP injection and her physical therapy. She no longer has pain in her daily activities, and primarily notices that it only bothers her at night. At night, she has more of an ache. She is still going to physical therapy twice a week and doing her exercises daily at home.   Patient has noticed a pinching pain in the low back for the last couple of weeks. She says that it is primarily on the right side, but she will have pain on the left side occasionally as well. She denies any radicular symptoms down the legs. She does notice this pain when going from seated to standing, as well as other movements that are similar to that. She is not doing anything to treat this pain at home, although they have been addressing it at PT since onset.

## 2024-06-14 NOTE — Progress Notes (Signed)
 "  Shannon Kane - 53 y.o. female MRN [id number]  Date of birth: 22-Oct-1970  Office Visit Note: Visit Date: 06/14/2024 PCP: Jarold Medici, MD Referred by: Jarold Medici, MD  Subjective: Chief Complaint  Patient presents with   Right Knee - Follow-up   HPI: Shannon Kane is a pleasant 54 y.o. female who presents today for f/u of right knee OA s/p PRP, also for R-gluteal/LBP.  Discussed the use of AI scribe software for clinical note transcription with the patient, who gave verbal consent to proceed.  History of Present Illness Shannon Kane is a 54 year old female with right knee osteoarthritis status post PRP injection who presents for follow-up of right knee pain and new right gluteal pain.  Ten weeks after right knee PRP injection, she reports about 60-70% improvement in knee pain and function. She still has limited range of motion with squatting and feels persistent quadriceps weakness, but is making progress with this in PT. She attends physical therapy twice weekly with focus on proximal strengthening and transition movements. She is satisfied that constant knee pain has decreased but feels overall function remains limited by weakness.  For the past two weeks she has had right buttock pain lateral to the hip, worse with rolling in bed and lifting the leg. She has not used medications and prefers heat at night and non-pharmacologic management. She says that it is primarily on the right side, but she will have pain on the left side occasionally as well. She denies any radicular symptoms down the legs. She does notice this pain when going from seated to standing, as well as other movements that are similar to that Physical therapy has added gluteal activation and external rotator strengthening.  She remains active in rehabilitation and is training for a running event on February 14.  Pertinent ROS were reviewed with the patient and found to be negative unless otherwise specified above in HPI.    Assessment & Plan: Visit Diagnoses:  1. Unilateral primary osteoarthritis, right knee   2. Gluteal tendinitis of right buttock   3. Acute right-sided low back pain without sciatica    Assessment & Plan Unilateral primary osteoarthritis of the right knee Ten weeks post-PRP injection, 60-70% improvement in pain, reduced constant pain, some ROM limitation and quadriceps weakness yet improving. Satisfied with progress. PRP expected to provide ongoing benefit. No acute concerns. Second PRP injection considered if progress plateaus or further improvement desired, not currently desired. - Continue physical therapy focusing on quadriceps strengthening and functional activities. - Discussed second PRP injection option if progress plateaus or further improvement desired; deferred. - Advised to monitor symptoms and report worsening pain or functional decline. - Provided anticipatory guidance on continued PRP benefits.  Right gluteal insufficiency with right buttock Pain and dysfunction due to proximal muscle weakness and compensatory overuse during knee rehab. No significant tear or bony pathology. Pain from gluteal muscle irritation and mild inflammation, exacerbated by therapy and activity. No significant hip or lumbar spine pathology. Favorable prognosis with focused therapy. Trigger point injection discussed, deferred per preference. - Continue physical therapy focusing on gluteal muscle strengthening, external rotators, and abductor activation. - Therapy to address gluteal dysfunction with specific exercises and stretching techniques, avoiding pain during stretching. - Advised use of heat at night for symptomatic relief. - Discussed trigger point injection if symptoms persist after focused therapy; deferred per preference. - Continue therapy for 3-4 weeks, then transition to home exercise program if improved. - Follow-up in one month or sooner  if symptoms do not improve or worsen.  Right low  back pain No significant right low back pain. Imaging and evaluation show no lumbar spine pathology. Pain localized to right gluteal region without radiation. Hips with mild OA, but no advanced arthropathy - No intervention required for low back pain at this time.  *Additional considerations: Piriformis/Gluteal injection  Follow-up: Return in about 1 month (around 07/15/2024), or if symptoms worsen or fail to improve.   Meds & Orders: No orders of the defined types were placed in this encounter.   Orders Placed This Encounter  Procedures   XR Lumbar Spine 2-3 Views     Procedures: No procedures performed      Clinical History: No specialty comments available.  She reports that she has never smoked. She has never used smokeless tobacco.  Recent Labs    08/03/23 0931 02/14/24 0841  HGBA1C 6.3* 6.0*    Objective:   Vital Signs: LMP 09/16/2019   Physical Exam  Gen: Well-appearing, in no acute distress; non-toxic CV: Well-perfused. Warm.  Resp: Breathing unlabored on room air; no wheezing. Psych: Fluid speech in conversation; appropriate affect; normal thought process  *MSK/Ortho Exam: Physical Exam MUSCULOSKELETAL: No pain with hip flexion. No pain with hip rotation. Pain with hip abduction. Weakness with hip flexion against resistance. No pain with hip rotation on side. Pain with hip abduction against resistance. No pain with clamshell exercise. Pain in right hip with rolling.  *Lumbar/Pelvis/Hips: Full range of motion in flexion and extension.  There is tenderness within the posterior aspect of the mid buttock near the piriformis and underlying gluteal musculature.  No greater trochanter TTP.  There is fluid logroll with internal and external motion of bilateral hips.  Positive FABER test on the right, negative FADIR, negative Stinchfield.  There is a mild degree of gluteal insufficiency with resisted hip abduction on the right which does reproduce pain, full and intact on the  left contralateral hip.  Imaging: XR Lumbar Spine 2-3 Views Result Date: 06/14/2024 3 view of the lumbar spine including AP pelvis, AP and lateral lumbar film were ordered and reviewed by myself today.  X-rays demonstrate femoral head well-seated within acetabulum.  There is a mild degree of inferior medial narrowing over bilateral hip joints.  Mild SI joint sclerosis bilaterally although patent.  There are 5 nonrib-bearing lumbar vertebra.  There is no listhesis present, preserved intervertebral disc space.  No acute bony fracture noted.   Past Medical/Family/Surgical/Social History: Medications & Allergies reviewed per EMR, new medications updated. Patient Active Problem List   Diagnosis Date Noted   Rash and nonspecific skin eruption 05/03/2024   Hematuria, microscopic 05/03/2024   Allergic contact dermatitis 05/03/2024   Leukocytes in urine 05/03/2024   Counseling about travel 02/08/2024   Acute swimmer's ear of left side 02/08/2024   Class 1 obesity due to excess calories with serious comorbidity and body mass index (BMI) of 30.0 to 30.9 in adult 02/07/2024   Chronic pain of right knee 02/07/2024   Palpitations 10/15/2023   Encounter for general adult medical examination w/o abnormal findings 08/03/2023   Other abnormal glucose 01/20/2023   Trigger middle finger of left hand 01/20/2023   Constipation 07/15/2021   Chronic idiopathic constipation 07/15/2021   Dysphagia 07/15/2021   Gastroesophageal reflux disease 07/15/2021   Uterine leiomyoma 04/19/2020   Genetic testing 04/19/2020   Family history of ovarian cancer 04/03/2020   Family history of leukemia 04/03/2020   Family history of breast cancer 04/03/2020  Malignant neoplasm of upper-outer quadrant of right breast in female, estrogen receptor negative (HCC) 03/28/2020   Overweight (BMI 25.0-29.9) 12/05/2019   Essential hypertension, benign 05/29/2018   Iritis of left eye 05/29/2018   Iron deficiency anemia due to chronic  blood loss 05/29/2018   Primary hypothyroidism 05/29/2018   Past Medical History:  Diagnosis Date   Abnormal glucose    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 Lynwood Nasuti   Hypertension    Hyperthyroidism    in the past   Hypothyroidism    Iron deficiency anemia    Palpitation    Palpitations 10/15/2023   Personal history of chemotherapy    Personal history of radiation therapy    Spontaneous ecchymoses    Tachycardia    Vitamin D  deficiency    Family History  Problem Relation Age of Onset   Diabetes Mother    Hypertension Mother    Leukemia Father        dx > 50   Ovarian cancer Maternal Grandmother 86   Bone cancer Paternal Grandfather        dx unknown age   Cancer Maternal Uncle        Mouth and throad; dx > 50   Breast cancer Other        MGM's niece, dx unknown age   Cancer Maternal Uncle        unknown type; dx > 50   Past Surgical History:  Procedure Laterality Date   BREAST BIOPSY     BREAST EXCISIONAL BIOPSY     BREAST LUMPECTOMY Right 08/20/2020   w/ rad w/ chemo   BREAST LUMPECTOMY WITH RADIOACTIVE SEED LOCALIZATION Right 08/20/2020   Procedure: RADIOACTIVE SEED GUIDED RIGHT BREAST LUMPECTOMY;  Surgeon: Vernetta Berg, MD;  Location: [facility];  Service: General;  Laterality: Right;   CHOLECYSTECTOMY     COLONOSCOPY  05/18/2018   KNEE SURGERY     PORT-A-CATH REMOVAL N/A 04/30/2021   Procedure: REMOVAL PORT-A-CATH;  Surgeon: Vernetta Berg, MD;  Location:  SUR[facility]ERY C[facility];  Service: General;  Laterality: N/A;   PORTACATH PLACEMENT Left 04/15/2020   Procedure: INSERTION PORT-A-CATH;  Surgeon: Vernetta Berg, MD;  Location:  SURGERY CENTER;  Service: General;  Laterality: Left;   RADIOACTIVE SEED GUIDED AXILLARY SENTINEL LYMPH NODE Right 08/20/2020    Procedure: RADIOACTIVE SEED TARGETED RIGHT AXILLARY LYMPH NODE DISSECTION;  Surgeon: Vernetta Berg, MD;  Location: [facility];  Service: General;  Laterality: Right;   TONSILLECTOMY     Social History   Occupational History   Not on file  Tobacco Use   Smoking status: Never   Smokeless tobacco: Never  Vaping Use   Vaping status: Never Used  Substance and Sexual Activity   Alcohol use: Yes    Alcohol/week: 0.0 standard drinks of alcohol    Comment: social   Drug use: Never   Sexual activity: Not Currently    Birth control/protection: None   I spent 35 minutes in the care of the patient today including face-to-face time, preparation to see the patient, as well as review of x-rays, orthobiologic/PRP discussion, review of PT notes and addition of stretching/exercises for HEP for the above diagnoses.   Lonell Sprang, DO Primary Care Sports Medicine Physician  [facility]  This note was dictated using  Dragon naturally speaking software and may contain errors in syntax, spelling, or content which have not been identified prior to signing this note.    "

## 2024-06-20 ENCOUNTER — Encounter: Admitting: Physical Therapy

## 2024-06-22 NOTE — Therapy (Incomplete)
 " OUTPATIENT PHYSICAL THERAPY LOWER EXTREMITY TREATMENT   Patient Name: Shannon Kane MRN: [id number] DOB:[birth year], 54 y.o., female Today's Date: 06/22/2024  END OF SESSION:      Past Medical History:  Diagnosis Date   Abnormal glucose    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 Lynwood Nasuti   Hypertension    Hyperthyroidism    in the past   Hypothyroidism    Iron deficiency anemia    Palpitation    Palpitations 10/15/2023   Personal history of chemotherapy    Personal history of radiation therapy    Spontaneous ecchymoses    Tachycardia    Vitamin D  deficiency    Past Surgical History:  Procedure Laterality Date   BREAST BIOPSY     BREAST EXCISIONAL BIOPSY     BREAST LUMPECTOMY Right 08/20/2020   w/ rad w/ chemo   BREAST LUMPECTOMY WITH RADIOACTIVE SEED LOCALIZATION Right 08/20/2020   Procedure: RADIOACTIVE SEED GUIDED RIGHT BREAST LUMPECTOMY;  Surgeon: Vernetta Berg, MD;  Location: [facility];  Service: General;  Laterality: Right;   CHOLECYSTECTOMY     COLONOSCOPY  05/18/2018   KNEE SURGERY     PORT-A-CATH REMOVAL N/A 04/30/2021   Procedure: REMOVAL PORT-A-CATH;  Surgeon: Vernetta Berg, MD;  Location: [facility];  Service: General;  Laterality: N/A;   PORTACATH PLACEMENT Left 04/15/2020   Procedure: INSERTION PORT-A-CATH;  Surgeon: Vernetta Berg, MD;  Location: [facility];  Service: General;  Laterality: Left;   RADIOACTIVE SEED GUIDED AXILLARY SENTINEL LYMPH NODE Right 08/20/2020   Procedure: RADIOACTIVE SEED TARGETED RIGHT AXILLARY LYMPH NODE DISSECTION;  Surgeon: Vernetta Berg, MD;  Location: [facility];  Service: General;  Laterality: Right;   TONSILLECTOMY     Patient Active Problem List   Diagnosis Date Noted   Rash and nonspecific skin eruption  05/03/2024   Hematuria, microscopic 05/03/2024   Allergic contact dermatitis 05/03/2024   Leukocytes in urine 05/03/2024   Counseling about travel 02/08/2024   Acute swimmer's ear of left side 02/08/2024   Class 1 obesity due to excess calories with serious comorbidity and body mass index (BMI) of 30.0 to 30.9 in adult 02/07/2024   Chronic pain of right knee 02/07/2024   Palpitations 10/15/2023   Encounter for general adult medical examination w/o abnormal findings 08/03/2023   Other abnormal glucose 01/20/2023   Trigger middle finger of left hand 01/20/2023   Constipation 07/15/2021   Chronic idiopathic constipation 07/15/2021   Dysphagia 07/15/2021   Gastroesophageal reflux disease 07/15/2021   Uterine leiomyoma 04/19/2020   Genetic testing 04/19/2020   Family history of ovarian cancer 04/03/2020   Family history of leukemia 04/03/2020   Family history of breast cancer 04/03/2020   Malignant neoplasm of upper-outer quadrant of right breast in female, estrogen receptor negative (HCC) 03/28/2020   Overweight (BMI 25.0-29.9) 12/05/2019   Essential hypertension, benign 05/29/2018   Iritis of left eye 05/29/2018   Iron deficiency anemia due to chronic blood loss 05/29/2018   Primary hypothyroidism 05/29/2018    PCP: Catheryn Slocumb, MD  REFERRING PROVIDER: Lonell Sprang, DO  REFERRING DIAG: [phone] (ICD-10-CM) - Chronic pain of right knee M17.11 (ICD-10-CM) - Unilateral primary osteoarthritis, right knee  THERAPY DIAG:  No diagnosis found.  Rationale for Evaluation and Treatment: Rehabilitation  ONSET DATE: chronic  SUBJECTIVE:  SUBJECTIVE STATEMENT: *** Patient reports that she is not having any pain in her knee while walking this morning, but it was aching last night. She also said that her sciatic nerve is bothering her a Musselman more this morning.  PERTINENT HISTORY: + R breast CA Last knee surgery 15+ years ago  PAIN:  ***  Are you having pain? Yes: NPRS  scale: knee 0/10, Rt glute 6-7/10 a Pain location: Medial right knee Pain description: pinch/sharp Aggravating factors: jumping, end range flexion, squatting Relieving factors: rest, exercises  PRECAUTIONS: None  RED FLAGS: None   WEIGHT BEARING RESTRICTIONS: No  FALLS:  Has patient fallen in last 6 months? No and Yes. Number of falls 1in a run  LIVING ENVIRONMENT: Lives with: lives with their spouse Lives in: House/apartment Stairs: Yes: Internal: 12 steps; on right going up Has following equipment at home: None  OCCUPATION: negotiator  PLOF: Independent  PATIENT GOALS: Softball. Pickleball, sew, paint, spinning class  NEXT MD VISIT: 06/14/24  OBJECTIVE:  Note: Objective measures were completed at Evaluation unless otherwise noted.  DIAGNOSTIC FINDINGS: none listed  PATIENT SURVEYS: *** PSFS: THE PATIENT SPECIFIC FUNCTIONAL SCALE  Place score of 0-10 (0 = unable to perform activity and 10 = able to perform activity at the same level as before injury or problem)  Activity Date: 04/25/2024 05/09/24 06/01/24  Descending stairs 7 7 9   2.   squatting 7 4 6   3.   in/out car 7 7 9   4.      Total Score 7 6 8     Total Score = Sum of activity scores/number of activities  Minimally Detectable Change: 3 points (for single activity); 2 points (for average score)  [facility] (nd). The Patient Specific Functional Scale . Retrieved from [email or web address]   COGNITION: Overall cognitive status: Within functional limits for tasks assessed     SENSATION: WFL  EDEMA:  Not measured  MUSCLE LENGTH: Hamstrings: Right mild restriction   POSTURE: L calcaneus valgus, mild L  genu varus, R knee flexed in standing,   PALPATION: 1+ at lateral jt line  LOWER EXTREMITY ROM:  Active/Passive ROM Right Eval 04/26/2024 Rt / Left Eval 05/09/2024 Left/Right 05/26/2024 Rt 06/06/24  Hip flexion 95 A     Hip  extension      Hip abduction 40 A     Hip adduction      Hip internal rotation      Hip external rotation      Knee flexion 110A/115 AAROM 118 / 132 140/120 120  Knee extension +8 to 0  +5/-1 -2  Ankle dorsiflexion      Ankle plantarflexion      Ankle inversion      Ankle eversion       (Blank rows = not tested)  LOWER EXTREMITY MMT:  MMT Right Eval 04/26/2024 Left Eval 04/25/2024 Rt  05/09/24 Left/Right 05/26/2024  Hip flexion 5  5   Hip extension      Hip abduction 5-  5   Hip adduction 5-  5   Hip internal rotation      Hip external rotation      Knee flexion 5-  5-   Knee extension 4+  5 99.1/98.2  Ankle dorsiflexion      Ankle plantarflexion      Ankle inversion      Ankle eversion       (Blank rows = not tested)  LOWER EXTREMITY SPECIAL TESTS:  Knee special tests:  Anterior drawer test: negative, McMurray's test: negative, and Bounce neg  FUNCTIONAL TESTS:  5 times sit to stand: 10 sec  GAIT: Distance walked: Medical Sales Representative device utilized: None Level of assistance: Complete Independence Comments: ---  ===========================================                                                                                                                      TODAYS TREATMENT 06/24/23 TherEx TherAct   TREATMENT DATE 06/09/2024 TherEx:  Recumbent bike level 2 for 6 minutes  Tap downs from 4 step 2x10 each side  Lateral tap downs from 4 step 2x each side   TherAct:  Bilat leg press 3x12 with 87# Unilat leg press 2x12 with 50# with bilat LE   Neuro Re-Ed: Seated slump: positive  Seated slump nerve glide 1x10 with 2-3s hold  PT discussed tensioner vs slider   Manual:  IASTM with percussive device to Rt lumbar musculature, Rt glutes, and Rt piriformis  Provided patient with tennis ball for STM at home and had her perform against wall   06/06/24 ThereEx:  Recumbent bike: level 3 x 5 minutes Supine gluteal stretch x 3 holding 20 sec Supine  piriformis stretch x 3 holding 20 sec Supine trunk rotation: x 3 bil holding 20 sec Standing hip flexor stretch with added low back stretch. TherActivites:  Double Leg Press: 87# 2 x 10  Single Leg Press: 50# 2 x 10 bil LE Sit to stand: 2 x 10 c verbal instruction to distribute weight equally between LE, attempted with green TB around knees to encourage Rt glute medius activation Manual:  Percussion to Rt glutes and rt piriformis  06/01/24 Rec Bike level 3 6 min Leg press 112# 3x10  Single  #56 2x10  Tandem walks blue foam forward x3 laps // bars  and 2 laps sideways Squats on Air ex 2x10  Hamstring and piriformis stretching bilaterally   05/30/24   Nustep L5x8 minutes seat 10 BLEs only  Shuttle BLE press 100# x15, 112# x8  Shuttle single leg press 56# x15 B, 62# x8 B   R quad/hip flexor stretch 3x30 seconds with strap Single leg bridge 2x10 B progressive knee flexion each round  Sidelying hip ABD + small forward and backward circles 2x10 B  Tandem walks blue foam forward x3 laps // bars  Side steps over cones while on blue foam x3 laps // bars    05/26/2024 Recumbent bike Seat 7 5 minutes Level 5-9 :50 12 + MPH and sprint the last :10 (max 27.5 MPH) Prone quadriceps stretch 4 x 20 seconds Seated straight leg raises 3 sets of 10 with 3#  Functional Activities (to help with hip stabilization for running/jumping activities): Hip hike and push 2 sets of 5 for 5 seconds each side  Neuromuscular re-education: Verbally reviewed Tandem balance and Single-leg balance eyes open; head turning and eyes closed     PATIENT EDUCATION:  Education details: HEP Person educated: Patient Education method: Explanation, Demonstration, Tactile cues,  Verbal cues, and Handouts Education comprehension: verbalized understanding, returned demonstration, and verbal cues required  HOME EXERCISE PROGRAM: Access Code: [email or web address] URL: [email or web address] Date: 05/26/2024 Prepared  by: Lamar Ivory  Exercises - Supine Quadriceps Stretch with Strap on Table  - 2 x daily - 7 x weekly - 1 sets - 4-5 reps - 10-15 seconds (longer is ok) hold - Seated Straight Leg Raise   - 1 x daily - 5-7 x weekly - 5 sets - 10 reps - 2 seconds hold - Tandem Stance  - 1 x daily - 3-7 x weekly - 1 sets - 10 reps - 20 second hold - Single Leg Stance  - 1 x daily - 3-7 x weekly - 1 sets - 10 reps - 10 seconds hold - Standing Hip Hiking  - 2 x daily - 7 x weekly - 2 sets - 5 reps - 5 seconds hold  ASSESSMENT:  CLINICAL IMPRESSION: *** Patient arrived to session noting increase sciatica like pain, though no complaints of knee pain. Patient tolerated all activities this date with slight increase in glute soreness/pain with strengthening. Patient will continue to benefit from skilled PT and has requested to continue 2x/wk for 4 more weeks with hopes to decrease sciatica pain.    OBJECTIVE IMPAIRMENTS: decreased ROM, decreased strength, impaired flexibility, and pain.   ACTIVITY LIMITATIONS: sporting  PARTICIPATION LIMITATIONS: gym activities sometimes affected  PERSONAL FACTORS: 1-2 comorbidities: CA, htn are also affecting patient's functional outcome.   REHAB POTENTIAL: Good  CLINICAL DECISION MAKING: Stable/uncomplicated  EVALUATION COMPLEXITY: Low   GOALS: *** Goals reviewed with patient? Yes  SHORT TERM GOALS: Target date: 05/18/2024   Patient will show compliance with initial HEP. Baseline: Goal status: Met 04/28/2024  2.  Patient will report pain level to decrease by 1 level.  Baseline:  Goal status: Met 05/09/24    LONG TERM GOALS: Target date: 06/22/2024  8 weeks    Patient will be independent with final HEP in order to maintain and progress upon functional gains made within PT. Baseline:  Goal status:MET 06/03/23   2.  Patient will report pain levels no greater than 2/10 in order to show an improved overall quality of life. Baseline:  Goal status: Partially met  05/26/2024  3.  Patient will increase PSFS to at least 3 points in order to show a significantly improved subjective disability rating. Baseline:  Goal status: At 8 points on 06/01/24  4.  Increase Active extension by 4 or more degrees and flexion by 5 degrees. Baseline:  Goal status: INITIAL  5.  Increase strength of R LE by 1/2 grade to 1 full grade in R LE.   Baseline:  Goal status: INITIAL   PLAN:  PT FREQUENCY: 1-2x/week  PT DURATION: 8 weeks  PLANNED INTERVENTIONS: 97164- PT Re-evaluation, 97750- Physical Performance Testing, 97110-Therapeutic exercises, 97530- Therapeutic activity, [id number]- Neuromuscular re-education, 97535- Self Care, [id number]- Manual therapy, [id number]- Gait training, [phone]- Orthotic Initial, [phone]- Orthotic/Prosthetic subsequent, [phone]- Canalith repositioning, [phone]- Aquatic Therapy, [phone]- Electrical stimulation (unattended), [phone]- Electrical stimulation (manual), [id number]- Vasopneumatic device, [id number]- Ultrasound, [id number]- Traction (mechanical), [id number]- Ionotophoresis 4mg /ml Dexamethasone , 79439 (1-2 muscles), [id number] (3+ muscles)- Dry Needling, Patient/Family education, Balance training, Stair training, Taping, Joint mobilization, Joint manipulation, Spinal manipulation, Spinal mobilization, Vestibular training, DME instructions, Cryotherapy, and Moist heat  PLAN FOR NEXT SESSION: hip rotation strengthening, quad strengthening, dynamic balance, dry needling for piriformis (?)   SIGN***   "

## 2024-06-23 ENCOUNTER — Ambulatory Visit: Admitting: Physical Therapy

## 2024-06-23 ENCOUNTER — Encounter: Payer: Self-pay | Admitting: Physical Therapy

## 2024-06-23 ENCOUNTER — Telehealth: Payer: Self-pay | Admitting: Sports Medicine

## 2024-06-23 DIAGNOSIS — G8929 Other chronic pain: Secondary | ICD-10-CM

## 2024-06-23 DIAGNOSIS — M25551 Pain in right hip: Secondary | ICD-10-CM

## 2024-06-23 DIAGNOSIS — M25661 Stiffness of right knee, not elsewhere classified: Secondary | ICD-10-CM

## 2024-06-23 NOTE — Telephone Encounter (Signed)
 Patient was here. She needs a letter of medical necessity from 04/05/2024 the PRP for her insurance. Her # [phone]

## 2024-06-27 ENCOUNTER — Encounter

## 2024-06-30 ENCOUNTER — Encounter

## 2024-07-03 ENCOUNTER — Ambulatory Visit: Payer: Self-pay | Admitting: Internal Medicine

## 2024-07-04 ENCOUNTER — Encounter

## 2024-07-06 ENCOUNTER — Encounter

## 2024-08-08 ENCOUNTER — Encounter: Payer: Self-pay | Admitting: Internal Medicine

## 2024-09-25 ENCOUNTER — Ambulatory Visit: Attending: Hematology and Oncology

## 2025-02-08 ENCOUNTER — Ambulatory Visit: Admitting: Hematology and Oncology
